# Patient Record
Sex: Male | Born: 1960
Health system: Southern US, Community
[De-identification: ages and names within clinical notes are randomized; demographics above are authoritative.]

## PROBLEM LIST (undated history)

## (undated) DIAGNOSIS — T8859XA Other complications of anesthesia, initial encounter: Secondary | ICD-10-CM

## (undated) DIAGNOSIS — J449 Chronic obstructive pulmonary disease, unspecified: Secondary | ICD-10-CM

## (undated) DIAGNOSIS — I499 Cardiac arrhythmia, unspecified: Secondary | ICD-10-CM

## (undated) DIAGNOSIS — G4730 Sleep apnea, unspecified: Secondary | ICD-10-CM

## (undated) DIAGNOSIS — F419 Anxiety disorder, unspecified: Secondary | ICD-10-CM

## (undated) DIAGNOSIS — R0600 Dyspnea, unspecified: Secondary | ICD-10-CM

## (undated) DIAGNOSIS — J189 Pneumonia, unspecified organism: Secondary | ICD-10-CM

## (undated) DIAGNOSIS — M1990 Unspecified osteoarthritis, unspecified site: Secondary | ICD-10-CM

## (undated) HISTORY — DX: Cardiac arrhythmia, unspecified: I49.9

## (undated) HISTORY — PX: SPINE SURGERY: SHX786

## (undated) HISTORY — PX: SEPTOPLASTY: SUR1290

## (undated) HISTORY — PX: COLONOSCOPY: SHX174

## (undated) HISTORY — PX: WRIST SURGERY: SHX841

## (undated) HISTORY — DX: Sleep apnea, unspecified: G47.30

## (undated) HISTORY — PX: SHOULDER ARTHROSCOPY: SHX128

## (undated) HISTORY — DX: Anxiety disorder, unspecified: F41.9

## (undated) HISTORY — PX: RHINOPLASTY: SUR1284

---

## 2001-05-14 ENCOUNTER — Emergency Department (HOSPITAL_COMMUNITY): Admission: EM | Admit: 2001-05-14 | Discharge: 2001-05-14 | Payer: Self-pay | Admitting: Emergency Medicine

## 2001-05-14 ENCOUNTER — Encounter: Payer: Self-pay | Admitting: Emergency Medicine

## 2001-05-19 ENCOUNTER — Emergency Department (HOSPITAL_COMMUNITY): Admission: EM | Admit: 2001-05-19 | Discharge: 2001-05-19 | Payer: Self-pay | Admitting: *Deleted

## 2003-11-16 ENCOUNTER — Ambulatory Visit (HOSPITAL_BASED_OUTPATIENT_CLINIC_OR_DEPARTMENT_OTHER): Admission: RE | Admit: 2003-11-16 | Discharge: 2003-11-16 | Payer: Self-pay | Admitting: Orthopedic Surgery

## 2003-11-16 ENCOUNTER — Ambulatory Visit (HOSPITAL_COMMUNITY): Admission: RE | Admit: 2003-11-16 | Discharge: 2003-11-16 | Payer: Self-pay | Admitting: Orthopedic Surgery

## 2009-08-23 ENCOUNTER — Ambulatory Visit: Payer: Self-pay | Admitting: Internal Medicine

## 2009-08-23 DIAGNOSIS — F329 Major depressive disorder, single episode, unspecified: Secondary | ICD-10-CM

## 2009-08-23 DIAGNOSIS — F32A Depression, unspecified: Secondary | ICD-10-CM | POA: Insufficient documentation

## 2009-08-23 DIAGNOSIS — R079 Chest pain, unspecified: Secondary | ICD-10-CM | POA: Insufficient documentation

## 2009-08-23 DIAGNOSIS — F419 Anxiety disorder, unspecified: Secondary | ICD-10-CM | POA: Insufficient documentation

## 2009-08-23 LAB — CONVERTED CEMR LAB
ALT: 22 units/L (ref 0–53)
AST: 23 units/L (ref 0–37)
Albumin: 4 g/dL (ref 3.5–5.2)
Alkaline Phosphatase: 51 units/L (ref 39–117)
BUN: 11 mg/dL (ref 6–23)
Basophils Absolute: 0 10*3/uL (ref 0.0–0.1)
Basophils Relative: 0.8 % (ref 0.0–3.0)
Bilirubin, Direct: 0.1 mg/dL (ref 0.0–0.3)
CO2: 30 meq/L (ref 19–32)
Calcium: 9.2 mg/dL (ref 8.4–10.5)
Chloride: 110 meq/L (ref 96–112)
Cholesterol: 181 mg/dL (ref 0–200)
Creatinine, Ser: 0.8 mg/dL (ref 0.4–1.5)
Eosinophils Absolute: 0.3 10*3/uL (ref 0.0–0.7)
Eosinophils Relative: 4.2 % (ref 0.0–5.0)
GFR calc non Af Amer: 110.07 mL/min (ref >60)
Glucose, Bld: 92 mg/dL (ref 70–99)
HCT: 45.3 % (ref 39.0–52.0)
Hemoglobin: 14.9 g/dL (ref 13.0–17.0)
Lymphocytes Relative: 34.3 % (ref 12.0–46.0)
Lymphs Abs: 2.1 10*3/uL (ref 0.7–4.0)
MCHC: 32.9 g/dL (ref 30.0–36.0)
MCV: 92.4 fL (ref 78.0–100.0)
Monocytes Absolute: 0.5 10*3/uL (ref 0.1–1.0)
Monocytes Relative: 7.9 % (ref 3.0–12.0)
Neutro Abs: 3.2 10*3/uL (ref 1.4–7.7)
Neutrophils Relative %: 52.8 % (ref 43.0–77.0)
Platelets: 220 10*3/uL (ref 150.0–400.0)
Potassium: 4.5 meq/L (ref 3.5–5.1)
RBC: 4.9 M/uL (ref 4.22–5.81)
RDW: 12.5 % (ref 11.5–14.6)
Sodium: 144 meq/L (ref 135–145)
TSH: 0.87 microintl units/mL (ref 0.35–5.50)
Total Bilirubin: 0.7 mg/dL (ref 0.3–1.2)
Total Protein: 7.1 g/dL (ref 6.0–8.3)
WBC: 6.1 10*3/uL (ref 4.5–10.5)

## 2010-07-17 NOTE — Assessment & Plan Note (Signed)
Summary: to be est/njr   Vital Signs:  Patient profile:   50 year old male Height:      72 inches Weight:      253 pounds BMI:     34.44 Temp:     98.4 degrees F oral BP sitting:   92 / 60  (right arm) Cuff size:   regular  Vitals Entered By: Duard Brady LPN (August 24, 6043 9:29 AM) CC: cpx - wants to stop smoking , c/o (L) side chest pain on/off  , insomnia Is Patient Diabetic? No   CC:  cpx - wants to stop smoking , c/o (L) side chest pain on/off  , and insomnia.  History of Present Illness: 50 year old gentleman, who is in today to establish with our practice.  He has a long history of ongoing tobacco use and wishes to pursue a smoking cessation program.  He does have a history of depression and his word about the exacerbation with treatment.  Past medical history is otherwise fairly unremarkable.  He does describe some occasional left anterior chest wall pain that has been chronic for some time.  He has a history of nail psoriasis  Preventive Screening-Counseling & Management  Alcohol-Tobacco     Smoking Status: current     Smoking Cessation Counseling: yes  Allergies (verified): No Known Drug Allergies  Past History:  Past Medical History: tobacco abuse nail  psoriasis Depression  Past Surgical History: rhinoplasty x 4 ankle injury.  Bone spur removal 2005  Family History: adopted  Social History: Reviewed history and no changes required. Divorced Current Smoker two children, ages 48 and 23Smoking Status:  current  Review of Systems       The patient complains of chest pain.  The patient denies anorexia, fever, weight loss, weight gain, vision loss, decreased hearing, hoarseness, syncope, dyspnea on exertion, peripheral edema, prolonged cough, headaches, hemoptysis, abdominal pain, melena, hematochezia, severe indigestion/heartburn, hematuria, incontinence, genital sores, muscle weakness, suspicious skin lesions, transient blindness, difficulty  walking, depression, unusual weight change, abnormal bleeding, enlarged lymph nodes, angioedema, breast masses, and testicular masses.    Physical Exam  General:  overweight-appearing.  low-normal blood pressure Head:  Normocephalic and atraumatic without obvious abnormalities. No apparent alopecia or balding. Eyes:  No corneal or conjunctival inflammation noted. EOMI. Perrla. Funduscopic exam benign, without hemorrhages, exudates or papilledema. Vision grossly normal. Ears:  External ear exam shows no significant lesions or deformities.  Otoscopic examination reveals clear canals, tympanic membranes are intact bilaterally without bulging, retraction, inflammation or discharge. Hearing is grossly normal bilaterally. Mouth:  Oral mucosa and oropharynx without lesions or exudates.  Teeth in good repair. Neck:  No deformities, masses, or tenderness noted. Chest Wall:  No deformities, masses, tenderness or gynecomastia noted. Breasts:  No masses or gynecomastia noted Lungs:  Normal respiratory effort, chest expands symmetrically. Lungs are clear to auscultation, no crackles or wheezes. Heart:  Normal rate and regular rhythm. S1 and S2 normal without gallop, murmur, click, rub or other extra sounds. Abdomen:  Bowel sounds positive,abdomen soft and non-tender without masses, organomegaly or hernias noted. Genitalia:  Testes bilaterally descended without nodularity, tenderness or masses. No scrotal masses or lesions. No penis lesions or urethral discharge. Msk:  No deformity or scoliosis noted of thoracic or lumbar spine.   Pulses:  R and L carotid,radial,femoral,dorsalis pedis and posterior tibial pulses are full and equal bilaterally Extremities:  No clubbing, cyanosis, edema, or deformity noted with normal full range of motion of all joints.  Neurologic:  alert & oriented X3, cranial nerves II-XII intact, sensation intact to light touch, gait normal, and DTRs symmetrical and normal.   Skin:  Intact  without suspicious lesions or rashes  nail  psoriasis involving both the hands and feet Cervical Nodes:  No lymphadenopathy noted Axillary Nodes:  No palpable lymphadenopathy Inguinal Nodes:  No significant adenopathy Psych:  Cognition and judgment appear intact. Alert and cooperative with normal attention span and concentration. No apparent delusions, illusions, hallucinations   Impression & Recommendations:  Problem # 1:  Preventive Health Care (ICD-V70.0)  Orders: Venipuncture (16109) TLB-BMP (Basic Metabolic Panel-BMET) (80048-METABOL) TLB-CBC Platelet - w/Differential (85025-CBCD) TLB-Hepatic/Liver Function Pnl (80076-HEPATIC) TLB-TSH (Thyroid Stimulating Hormone) (84443-TSH) TLB-Cholesterol, Total (82465-CHO)  Complete Medication List: 1)  Chantix Starting Month Pak 0.5 Mg X 11 & 1 Mg X 42 Tabs (Varenicline tartrate) .... As directed 2)  Chantix 1 Mg Tabs (Varenicline tartrate) .... One twice daily 3)  Depakote 250 Mg Tbec (Divalproex sodium) .... One daily  Other Orders: EKG w/ Interpretation (93000)  Patient Instructions: 1)  Tobacco is very bad for your health and your loved ones! You Should stop smoking!. 2)  It is important that you exercise regularly at least 20 minutes 5 times a week. If you develop chest pain, have severe difficulty breathing, or feel very tired , stop exercising immediately and seek medical attention. 3)  You need to lose weight. Consider a lower calorie diet and regular exercise.  Prescriptions: DEPAKOTE 250 MG TBEC (DIVALPROEX SODIUM) one daily  #90 x 0   Entered and Authorized by:   Gordy Savers  MD   Signed by:   Gordy Savers  MD on 08/23/2009   Method used:   Print then Give to Patient   RxID:   [id number] CHANTIX 1 MG TABS (VARENICLINE TARTRATE) one twice daily  #60 x 2   Entered and Authorized by:   Gordy Savers  MD   Signed by:   Gordy Savers  MD on 08/23/2009   Method used:   Print then Give to  Patient   RxID:   [id number] CHANTIX STARTING MONTH PAK 0.5 MG X 11 & 1 MG X 42 TABS (VARENICLINE TARTRATE) as directed  #one month x 0   Entered and Authorized by:   Gordy Savers  MD   Signed by:   Gordy Savers  MD on 08/23/2009   Method used:   Print then Give to Patient   RxID:   [id number]

## 2010-11-02 NOTE — Op Note (Signed)
NAME:  Billy Roman, Billy Roman                     ACCOUNT NO.:  [id number]   MEDICAL RECORD NO.:  [id number]                   PATIENT TYPE:  AMB   LOCATION:  [facility]                                  FACILITY:  [facility]   PHYSICIAN:  Mila Homer. Sherlean Foot, M.D.              DATE OF BIRTH:  1960/07/11   DATE OF PROCEDURE:  11/16/2003  DATE OF DISCHARGE:                                 OPERATIVE REPORT   SURGEON:  Mila Homer. Sherlean Foot, M.D.   ASSISTANT:  None.   ANESTHESIA:  General.   PREOPERATIVE DIAGNOSIS:  Left ankle osteoarthritis with anterior spur and  loose bodies.   POSTOPERATIVE DIAGNOSIS:  Left ankle osteoarthritis with anterior spur and  loose bodies.   PROCEDURE:  Left ankle arthroscopy with anterior spur removal, loose body  removal, and debridement.   DESCRIPTION OF PROCEDURE:  The patient was taken to the operating room,  administered general anesthesia.  The left leg was placed in the flexed  position with a leg holder proximal to the knee.  The left lower extremity  was prepped and draped in the usual sterile fashion.  Anterolateral and  anteromedial portals were created with an #11 blade, blunt trocar, and  cannula.  There were large areas of grade 4 chondromalacia on the tibia,  grade 1 and 2 changes only on the talus, and lots and lots of scar tissue.  A 2.0 mm great white shaver was used to debride the scar tissue.  There were  several delaminating surfaces of articular cartilage which were removed.  There were two very large loose bodies which were removed as well.  I then  isolated off the anterior spur with an Arthrex probe and debridement wand.  I then used a 2.9 ball-shaped bur to remove the anterior osteophyte.  I then  took lateral with the [facility] device to ensure that the radiograph evidence of  the spur was removed in its entirety.  This allowed excellent anterior  decompression.  I then removed the fluid and instruments and closed with  Steri-Strips, dressings,  sponges, sterile Webril and Ace wrap.  Complications none.  Drains none.                                               Mila Homer. Sherlean Foot, M.D.    SDL/MEDQ  D:  11/16/2003  T:  11/16/2003  Job:  [id number]

## 2015-10-08 ENCOUNTER — Ambulatory Visit (INDEPENDENT_AMBULATORY_CARE_PROVIDER_SITE_OTHER): Payer: Managed Care, Other (non HMO) | Admitting: Emergency Medicine

## 2015-10-08 VITALS — BP 132/82 | HR 79 | Temp 97.5°F | Resp 16 | Ht 72.0 in | Wt 291.0 lb

## 2015-10-08 DIAGNOSIS — Z131 Encounter for screening for diabetes mellitus: Secondary | ICD-10-CM | POA: Diagnosis not present

## 2015-10-08 DIAGNOSIS — M5489 Other dorsalgia: Secondary | ICD-10-CM | POA: Diagnosis not present

## 2015-10-08 LAB — GLUCOSE, POCT (MANUAL RESULT ENTRY): POC Glucose: 92 mg/dl (ref 70–99)

## 2015-10-08 MED ORDER — PREDNISONE 10 MG PO TABS: ORAL_TABLET | ORAL | Status: DC

## 2015-10-08 NOTE — Progress Notes (Signed)
By signing my name below, I, Raven Small, attest that this documentation has been prepared under the direction and in the presence of Nena Jordan, MD.  Electronically Signed: Thea Alken, ED Scribe. 10/08/2015. 2:17 PM.  Chief Complaint:  Chief Complaint  Patient presents with  . Back Pain    x 2 days   HPI: Billy Roman is a 55 y.o. male who reports to [facility] today complaining of intermittent low back spasm. Pt has hx of laminectomy in 2013. Pt states he had been working underneath his car 4 days ago aggravating back pain.  He has taken 2 aleve past 16 hours without relief to the pain. He also takes diazepam but only at night. Pt has 10 hour plane trip to [facility] in 4 days ago and would like anti inflammatory for back pain. He has been on prednisone in the past. He denies other medical problems.    History reviewed. No pertinent past medical history. History reviewed. No pertinent past surgical history. Social History   Social History  . Marital Status: Single    Spouse Name: N/A  . Number of Children: N/A  . Years of Education: N/A   Social History Main Topics  . Smoking status: Never Smoker   . Smokeless tobacco: None  . Alcohol Use: None  . Drug Use: None  . Sexual Activity: Not Asked   Other Topics Concern  . None   Social History Narrative  . None   History reviewed. No pertinent family history. Allergies  Allergen Reactions  . Propyphenazone Itching and Rash   Prior to Admission medications   Medication Sig Start Date End Date Taking? Authorizing Provider  diazepam (VALIUM) 5 MG tablet Take 5 mg by mouth every 6 (six) hours as needed for anxiety.   Yes Historical Provider, MD     ROS: The patient denies fevers, chills, night sweats, unintentional weight loss, chest pain, palpitations, wheezing, dyspnea on exertion, nausea, vomiting, abdominal pain, dysuria, hematuria, melena, numbness, weakness, or tingling.   All other systems have been reviewed and  were otherwise negative with the exception of those mentioned in the HPI and as above.    PHYSICAL EXAM: Filed Vitals:   10/08/15 1354  BP: 132/82  Pulse: 79  Temp: 97.5 F (36.4 C)  Resp: 16   Body mass index is 39.46 kg/(m^2).   General: Alert, no acute distress HEENT:  Normocephalic, atraumatic, oropharynx patent. Eye: Juliette Mangle [facility] Cardiovascular:  Regular rate and rhythm, no rubs murmurs or gallops.  No Carotid bruits, radial pulse intact. No pedal edema.  Respiratory: Clear to auscultation bilaterally.  No wheezes, rales, or rhonchi.  No cyanosis, no use of accessory musculature Abdominal: No organomegaly, abdomen is soft and non-tender, positive bowel sounds.  No masses. Musculoskeletal: Gait intact. No edema healed scar over lower lumbar spine. Straight leg raise at 90 degrees causes discomfort. Reflexes are 1+ knees and ankle. No definite focal weakness.  Skin: No rashes. Neurologic: Facial musculature symmetric. Psychiatric: Patient acts appropriately throughout our interaction. Lymphatic: No cervical or submandibular lymphadenopathy    LABS: Results for orders placed or performed in visit on 10/08/15  POCT glucose (manual entry)  Result Value Ref Range   POC Glucose 92 70 - 99 mg/dl    ASSESSMENT/PLAN: Sugar was good. Will treat with a taper dose of prednisone 10 mg. 40 mg a day for 3 days 30 mg a day for 3 days 20 mg a day for 3 days 10 mg  a day for 3 days. He does have diazepam he can take at night for back pain.I personally performed the services described in this documentation, which was scribed in my presence. The recorded information has been reviewed and is accurate.   Gross sideeffects, risk and benefits, and alternatives of medications d/w patient. Patient is aware that all medications have potential sideeffects and we are unable to predict every sideeffect or drug-drug interaction that may occur.  Arlyss Queen MD 10/08/2015 2:14 PM

## 2015-10-08 NOTE — Patient Instructions (Addendum)
IF you received an x-ray today, you will receive an invoice from [facility]. Please contact [facility] at [phone] with questions or concerns regarding your invoice.   IF you received labwork today, you will receive an invoice from [employer]. Please contact [facility] at [phone] with questions or concerns regarding your invoice.   Our billing staff will not be able to assist you with questions regarding bills from these companies.  You will be contacted with the lab results as soon as they are available. The fastest way to get your results is to activate your My Chart account. Instructions are located on the last page of this paperwork. If you have not heard from [place] regarding the results in 2 weeks, please contact this office.     Back Pain, Adult Back pain is very common in adults.The cause of back pain is rarely dangerous and the pain often gets better over time.The cause of your back pain may not be known. Some common causes of back pain include:  Strain of the muscles or ligaments supporting the spine.  Wear and tear (degeneration) of the spinal disks.  Arthritis.  Direct injury to the back. For many people, back pain may return. Since back pain is rarely dangerous, most people can learn to manage this condition on their own. HOME CARE INSTRUCTIONS Watch your back pain for any changes. The following actions may help to lessen any discomfort you are feeling:  Remain active. It is stressful on your back to sit or stand in one place for long periods of time. Do not sit, drive, or stand in one place for more than 30 minutes at a time. Take short walks on even surfaces as soon as you are able.Try to increase the length of time you walk each day.  Exercise regularly as directed by your health care provider. Exercise helps your back heal faster. It also helps avoid future injury by keeping your muscles strong and flexible.  Do  not stay in bed.Resting more than 1-2 days can delay your recovery.  Pay attention to your body when you bend and lift. The most comfortable positions are those that put less stress on your recovering back. Always use proper lifting techniques, including:  Bending your knees.  Keeping the load close to your body.  Avoiding twisting.  Find a comfortable position to sleep. Use a firm mattress and lie on your side with your knees slightly bent. If you lie on your back, put a pillow under your knees.  Avoid feeling anxious or stressed.Stress increases muscle tension and can worsen back pain.It is important to recognize when you are anxious or stressed and learn ways to manage it, such as with exercise.  Take medicines only as directed by your health care provider. Over-the-counter medicines to reduce pain and inflammation are often the most helpful.Your health care provider may prescribe muscle relaxant drugs.These medicines help dull your pain so you can more quickly return to your normal activities and healthy exercise.  Apply ice to the injured area:  Put ice in a plastic bag.  Place a towel between your skin and the bag.  Leave the ice on for 20 minutes, 2-3 times a day for the first 2-3 days. After that, ice and heat may be alternated to reduce pain and spasms.  Maintain a healthy weight. Excess weight puts extra stress on your back and makes it difficult to maintain good posture. SEEK MEDICAL CARE IF:  You have  pain that is not relieved with rest or medicine.  You have increasing pain going down into the legs or buttocks.  You have pain that does not improve in one week.  You have night pain.  You lose weight.  You have a fever or chills. SEEK IMMEDIATE MEDICAL CARE IF:   You develop new bowel or bladder control problems.  You have unusual weakness or numbness in your arms or legs.  You develop nausea or vomiting.  You develop abdominal pain.  You feel faint.    This information is not intended to replace advice given to you by your health care provider. Make sure you discuss any questions you have with your health care provider.   Document Released: 06/03/2005 Document Revised: 06/24/2014 Document Reviewed: 10/05/2013 Elsevier Interactive Patient Education [employer].

## 2016-01-10 ENCOUNTER — Ambulatory Visit (INDEPENDENT_AMBULATORY_CARE_PROVIDER_SITE_OTHER): Payer: Managed Care, Other (non HMO) | Admitting: Physician Assistant

## 2016-01-10 VITALS — BP 126/88 | HR 88 | Temp 98.1°F | Resp 18 | Ht 72.0 in | Wt 300.0 lb

## 2016-01-10 DIAGNOSIS — L559 Sunburn, unspecified: Secondary | ICD-10-CM

## 2016-01-10 DIAGNOSIS — R21 Rash and other nonspecific skin eruption: Secondary | ICD-10-CM | POA: Diagnosis not present

## 2016-01-10 MED ORDER — SILVER SULFADIAZINE 1 % EX CREA: 1.0000 "application " | TOPICAL_CREAM | Freq: Every day | CUTANEOUS | 0 refills | Status: DC

## 2016-01-10 NOTE — Patient Instructions (Addendum)
IF you received an x-ray today, you will receive an invoice from [facility]. Please contact [facility] at [phone] with questions or concerns regarding your invoice.   IF you received labwork today, you will receive an invoice from [employer]. Please contact [facility] at [phone] with questions or concerns regarding your invoice.   Our billing staff will not be able to assist you with questions regarding bills from these companies.  You will be contacted with the lab results as soon as they are available. The fastest way to get your results is to activate your My Chart account. Instructions are located on the last page of this paperwork. If you have not heard from [place] regarding the results in 2 weeks, please contact this office.    Please apply this daily to the area.  Elevate the legs and rest as much as possible.   We should see you again in 3 days for recheck, unless miraculously better. Sunburn Sunburn is damage to the skin caused by overexposure to ultraviolet (UV) rays. People with light skin or a fair complexion may be more susceptible to sunburn. Repeated sun exposure causes early skin aging such as wrinkles and sun spots. It also increases the risk of skin cancer. CAUSES A sunburn is caused by getting too much UV radiation from the sun. SYMPTOMS  Red or pink skin.  Soreness and swelling.  Pain.  Blisters.  Peeling skin.  Headache, fever, and fatigue if sunburn covers a large area. TREATMENT  Your caregiver may tell you to take certain medicines to lessen inflammation.  Your caregiver may have you use hydrocortisone cream or spray to help with itching and inflammation.  Your caregiver may prescribe an antibiotic cream to use on blisters. HOME CARE INSTRUCTIONS   Avoid further exposure to the sun.  Cool baths and cool compresses may be helpful if used several times per day. Do not apply ice, since this may  result in more damage to the skin.  Only take over-the-counter or prescription medicines for pain, discomfort, or fever as directed by your caregiver.  Use aloe or other over-the-counter sunburn creams or gels on your skin. Do not apply these creams or gels on blisters.  Drink enough fluids to keep your urine clear or pale yellow.  Do not break blisters. If blisters break, your caregiver may recommend an antibiotic cream to apply to the affected area. PREVENTION   Try to avoid the sun between 10:00 a.m. and 4:00 p.m. when it is the strongest.  Apply sunscreen at least 30 minutes before exposure to the sun.  Always wear protective hats, clothing, and sunglasses with UV protection.  Avoid medicines, herbs, and foods that increase your sensitivity to sunlight.  Avoid tanning beds. SEEK IMMEDIATE MEDICAL CARE IF:   You have a fever.  Your pain is uncontrolled with medicine.  You start to vomit or have diarrhea.  You feel faint or develop a headache with confusion.  You develop severe blistering.  You have a pus-like (purulent) discharge coming from the blisters.  Your burn becomes more painful and swollen. MAKE SURE YOU:  Understand these instructions.  Will watch your condition.  Will get help right away if you are not doing well or get worse.   This information is not intended to replace advice given to you by your health care provider. Make sure you discuss any questions you have with your health care provider.   Document Released: 03/13/2005 Document Revised: 09/28/2012  Document Reviewed: 12/05/2014 Elsevier Interactive Patient Education [employer].

## 2016-01-10 NOTE — Progress Notes (Signed)
[facility] [address], [place] [address] [phone]  Date:  01/10/2016   Name:  Billy Roman   DOB:  Mar 11, 1961   MRN:  [id number]  PCP:  No PCP Per Patient   Chief Complaint  Patient presents with  . Other    SUN POISON ON LEGS   History of Present Illness:  Billy Roman is a 55 y.o. male patient who presents to [facility] for rash on legs  3 days ago.  Exposed to sunlight for several hours without sun block about 4 days ago.  He has noted the redness and swelling of his lower legs.  They are tender.  He notes subjective fever and chills.  He has no rash along arms or trunk.  He has had this before.  No known exposure to poison oak/sumac/or ivy.  No known cuts or scrapes.        Patient Active Problem List   Diagnosis Date Noted  . DEPRESSION 08/23/2009  . CHEST PAIN UNSPECIFIED 08/23/2009    History reviewed. No pertinent past medical history.  History reviewed. No pertinent surgical history.  Social History  Substance Use Topics  . Smoking status: Never Smoker  . Smokeless tobacco: Never Used  . Alcohol use No    History reviewed. No pertinent family history.  Allergies  Allergen Reactions  . Propyphenazone Itching and Rash    Medication list has been reviewed and updated.  Current Outpatient Prescriptions on File Prior to Visit  Medication Sig Dispense Refill  . diazepam (VALIUM) 5 MG tablet Take 5 mg by mouth every 6 (six) hours as needed for anxiety.    . predniSONE (DELTASONE) 10 MG tablet Take 4  a  day for 3 days 3 a day for 3 days 2 a day for 3 days one a day for 3 days (Patient not taking: Reported on 01/10/2016) 30 tablet 0   No current facility-administered medications on file prior to visit.     ROS ROS otherwise unremarkable unless listed above.   Physical Examination: BP 126/88   Pulse 88   Temp 98.1 F (36.7 C) (Oral)   Resp 18   Ht 6' (1.829 m)   Wt 300 lb (136.1 kg)   SpO2 97%   BMI 40.69 kg/m   Ideal Body Weight: Weight in (lb) to have BMI = 25: 183.9  Physical Exam  Constitutional: He is oriented to person, place, and time. He appears well-developed and well-nourished. No distress.  HENT:  Head: Normocephalic and atraumatic.  Eyes: Conjunctivae and EOM are normal. Pupils are equal, round, and reactive to light.  Cardiovascular: Normal rate.   Pulmonary/Chest: Effort normal. No respiratory distress.  Neurological: He is alert and oriented to person, place, and time.  Skin: Skin is warm and dry. No abrasion and no ecchymosis noted. He is not diaphoretic.  Anterior severely erythematous lower starting at the knee.  This is consistent color.  Tenderness along the tibia.  There are bilateral bullae with serous fluid.  No purulence.  Tape removed with superficial layer removed upon removal.  Psychiatric: He has a normal mood and affect. His behavior is normal.     Assessment and Plan: Billy Roman is a 55 y.o. male who is here today for cc of rash along legs. Severe sunburning. Placed silver sulfazidine.  First aid bandage placed.  Advised wound care.   rtc in 3 days. Rash and nonspecific skin eruption - Plan: POCT CBC, silver  sulfADIAZINE (SILVADENE) 1 % cream  Burn from the sun - Plan: silver sulfADIAZINE (SILVADENE) 1 % cream   Ivar Drape, PA-C [facility] 01/10/2016 11:34 AM

## 2016-04-18 ENCOUNTER — Ambulatory Visit (INDEPENDENT_AMBULATORY_CARE_PROVIDER_SITE_OTHER): Payer: Managed Care, Other (non HMO) | Admitting: Physician Assistant

## 2016-04-18 VITALS — BP 126/88 | HR 87 | Temp 98.6°F | Resp 16 | Ht 72.0 in | Wt 298.0 lb

## 2016-04-18 DIAGNOSIS — R21 Rash and other nonspecific skin eruption: Secondary | ICD-10-CM | POA: Diagnosis not present

## 2016-04-18 DIAGNOSIS — J988 Other specified respiratory disorders: Secondary | ICD-10-CM

## 2016-04-18 MED ORDER — GUAIFENESIN ER 1200 MG PO TB12: 1.0000 | ORAL_TABLET | Freq: Two times a day (BID) | ORAL | 1 refills | Status: DC | PRN

## 2016-04-18 MED ORDER — FLUTICASONE PROPIONATE 50 MCG/ACT NA SUSP: 2.0000 | Freq: Every day | NASAL | 1 refills | Status: DC

## 2016-04-18 MED ORDER — CLOTRIMAZOLE-BETAMETHASONE 1-0.05 % EX CREA: 1.0000 "application " | TOPICAL_CREAM | Freq: Two times a day (BID) | CUTANEOUS | 0 refills | Status: DC

## 2016-04-18 NOTE — Progress Notes (Signed)
[facility] [address], [place] [address] [phone]  Date:  04/18/2016   Name:  Billy Roman   DOB:  1960/06/27   MRN:  [id number]  PCP:  No PCP Per Patient    History of Present Illness:  Billy Roman is a 55 y.o. male patient who presents to [facility] for cc of coughing and hand rash.    Coughing in the morning a productive greenish yellow mucus, for 1 week.  He has some dyspnea.  3-4 days ago.  Sore throat and hoarseness.  No nasal congestion.  No seasonal allergies.  No sneezing or water eyes.    Middle left hand in between has blister that will weep.  He puts cortisone which helps.  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  This has gone on chronically, and heals but then reopens.  He states that he avoids scratching but at times will.     Patient Active Problem List   Diagnosis Date Noted  . DEPRESSION 08/23/2009  . CHEST PAIN UNSPECIFIED 08/23/2009    History reviewed. No pertinent past medical history.  History reviewed. No pertinent surgical history.  Social History  Substance Use Topics  . Smoking status: Former Research scientist (life sciences)  . Smokeless tobacco: Never Used  . Alcohol use No    History reviewed. No pertinent family history.  Allergies  Allergen Reactions  . Propyphenazone Itching and Rash    Medication list has been reviewed and updated.  Current Outpatient Prescriptions on File Prior to Visit  Medication Sig Dispense Refill  . diazepam (VALIUM) 5 MG tablet Take 5 mg by mouth every 6 (six) hours as needed for anxiety.    . predniSONE (DELTASONE) 10 MG tablet Take 4  a  day for 3 days 3 a day for 3 days 2 a day for 3 days one a day for 3 days (Patient not taking: Reported on 04/18/2016) 30 tablet 0  . silver sulfADIAZINE (SILVADENE) 1 % cream Apply 1 application topically daily. To lower leg (Patient not taking: Reported on 04/18/2016)  50 g 0   No current facility-administered medications on file prior to visit.     ROS   Physical Examination: BP 126/88   Pulse 87   Temp 98.6 F (37 C) (Oral)   Resp 16   Ht 6' (1.829 m)   Wt 298 lb (135.2 kg)   SpO2 96%   BMI 40.42 kg/m  Ideal Body Weight: Weight in (lb) to have BMI = 25: 183.9  Physical Exam  Constitutional: He is oriented to person, place, and time. He appears well-developed and well-nourished. No distress.  HENT:  Head: Normocephalic and atraumatic.  Right Ear: Tympanic membrane, external ear and ear canal normal.  Left Ear: Tympanic membrane, external ear and ear canal normal.  Nose: Mucosal edema and rhinorrhea present. Right sinus exhibits no maxillary sinus tenderness and no frontal sinus tenderness. Left sinus exhibits no maxillary sinus tenderness and no frontal sinus tenderness.  Mouth/Throat: No uvula swelling. No oropharyngeal exudate, posterior oropharyngeal edema or posterior oropharyngeal erythema.  Eyes: Conjunctivae, EOM and lids are normal. Pupils are equal, round, and reactive to light. Right eye exhibits normal extraocular motion. Left eye exhibits normal extraocular motion.  Neck: Trachea normal and full passive range of motion without pain. No edema and no erythema present.  Cardiovascular: Normal rate.  Exam reveals  no gallop and no friction rub.   No murmur heard. Pulmonary/Chest: Effort normal. No respiratory distress. He has no decreased breath sounds. He has no wheezes. He has no rhonchi.  Neurological: He is alert and oriented to person, place, and time.  Skin: Skin is warm and dry. He is not diaphoretic.  Interdigit at the 3rd and 4th finger with three epidermal tears.  No erythema.  Non-tender  Psychiatric: He has a normal mood and affect. His behavior is normal.     Assessment and Plan: Billy Roman is a 55 y.o. male who is here today for cc of cough, congestion, and hand pain. I will treat with fungal/steroid.   Advised not to use over 2 weeks.   Will treat with mucinex at this time.  Also advised flonase.  Hydration instructed.  If he contacts after 72 hours, it is fine to prescribe azithromycin in a zpak form.   Rash of hands - Plan: clotrimazole-betamethasone (LOTRISONE) cream  Respiratory infection - Plan: Guaifenesin (MUCINEX MAXIMUM STRENGTH) 1200 MG TB12  Ivar Drape, PA-C [facility] 04/18/2016 10:12 AM

## 2016-04-18 NOTE — Patient Instructions (Addendum)
Please take the mucinex and flonase as prescribed.   Increase your hydration to 64 oz of water if not more. If you continue to have symptoms after 3 more days, I would like you to contact by phone or come in. Hydration, use of your humidifier, and vocal rest for the hoarseness.    Upper Respiratory Infection, Adult Most upper respiratory infections (URIs) are a viral infection of the air passages leading to the lungs. A URI affects the nose, throat, and upper air passages. The most common type of URI is nasopharyngitis and is typically referred to as "the common cold." URIs run their course and usually go away on their own. Most of the time, a URI does not require medical attention, but sometimes a bacterial infection in the upper airways can follow a viral infection. This is called a secondary infection. Sinus and middle ear infections are common types of secondary upper respiratory infections. Bacterial pneumonia can also complicate a URI. A URI can worsen asthma and chronic obstructive pulmonary disease (COPD). Sometimes, these complications can require emergency medical care and may be life threatening.  CAUSES Almost all URIs are caused by viruses. A virus is a type of germ and can spread from one person to another.  RISKS FACTORS You may be at risk for a URI if:   You smoke.   You have chronic heart or lung disease.  You have a weakened defense (immune) system.   You are very young or very old.   You have nasal allergies or asthma.  You work in crowded or poorly ventilated areas.  You work in health care facilities or schools. SIGNS AND SYMPTOMS  Symptoms typically develop 2-3 days after you come in contact with a cold virus. Most viral URIs last 7-10 days. However, viral URIs from the influenza virus (flu virus) can last 14-18 days and are typically more severe. Symptoms may include:   Runny or stuffy (congested) nose.   Sneezing.   Cough.   Sore throat.    Headache.   Fatigue.   Fever.   Loss of appetite.   Pain in your forehead, behind your eyes, and over your cheekbones (sinus pain).  Muscle aches.  DIAGNOSIS  Your health care provider may diagnose a URI by:  Physical exam.  Tests to check that your symptoms are not due to another condition such as:  Strep throat.  Sinusitis.  Pneumonia.  Asthma. TREATMENT  A URI goes away on its own with time. It cannot be cured with medicines, but medicines may be prescribed or recommended to relieve symptoms. Medicines may help:  Reduce your fever.  Reduce your cough.  Relieve nasal congestion. HOME CARE INSTRUCTIONS   Take medicines only as directed by your health care provider.   Gargle warm saltwater or take cough drops to comfort your throat as directed by your health care provider.  Use a warm mist humidifier or inhale steam from a shower to increase air moisture. This may make it easier to breathe.  Drink enough fluid to keep your urine clear or pale yellow.   Eat soups and other clear broths and maintain good nutrition.   Rest as needed.   Return to work when your temperature has returned to normal or as your health care provider advises. You may need to stay home longer to avoid infecting others. You can also use a face mask and careful hand washing to prevent spread of the virus.  Increase the usage of your inhaler if  you have asthma.   Do not use any tobacco products, including cigarettes, chewing tobacco, or electronic cigarettes. If you need help quitting, ask your health care provider. PREVENTION  The best way to protect yourself from getting a cold is to practice good hygiene.   Avoid oral or hand contact with people with cold symptoms.   Wash your hands often if contact occurs.  There is no clear evidence that vitamin C, vitamin E, echinacea, or exercise reduces the chance of developing a cold. However, it is always recommended to get plenty  of rest, exercise, and practice good nutrition.  SEEK MEDICAL CARE IF:   You are getting worse rather than better.   Your symptoms are not controlled by medicine.   You have chills.  You have worsening shortness of breath.  You have brown or red mucus.  You have yellow or brown nasal discharge.  You have pain in your face, especially when you bend forward.  You have a fever.  You have swollen neck glands.  You have pain while swallowing.  You have white areas in the back of your throat. SEEK IMMEDIATE MEDICAL CARE IF:   You have severe or persistent:  Headache.  Ear pain.  Sinus pain.  Chest pain.  You have chronic lung disease and any of the following:  Wheezing.  Prolonged cough.  Coughing up blood.  A change in your usual mucus.  You have a stiff neck.  You have changes in your:  Vision.  Hearing.  Thinking.  Mood. MAKE SURE YOU:   Understand these instructions.  Will watch your condition.  Will get help right away if you are not doing well or get worse.   This information is not intended to replace advice given to you by your health care provider. Make sure you discuss any questions you have with your health care provider.   Document Released: 11/27/2000 Document Revised: 10/18/2014 Document Reviewed: 09/08/2013 Elsevier Interactive Patient Education 2016 [employer].     IF you received an x-ray today, you will receive an invoice from [facility]. Please contact [facility] at [phone] with questions or concerns regarding your invoice.   IF you received labwork today, you will receive an invoice from [employer]. Please contact [facility] at [phone] with questions or concerns regarding your invoice.   Our billing staff will not be able to assist you with questions regarding bills from these companies.  You will be contacted with the lab results as soon as they are available. The  fastest way to get your results is to activate your My Chart account. Instructions are located on the last page of this paperwork. If you have not heard from [place] regarding the results in 2 weeks, please contact this office.

## 2016-05-26 ENCOUNTER — Telehealth: Payer: Self-pay | Admitting: Family Medicine

## 2016-05-26 NOTE — Telephone Encounter (Signed)
Call from answering service. Was last seen 2 or 3 weeks ago, unknown provider. New complaints of fever since last night, somewhat lethargic, but denies any dyspnea/ chest pain. He is taking TheraFlu and Tylenol. Advised office visit with [place] or other medical care provider for further evaluation of symptoms. Hours for tomorrow were provided. ER precautions discussed if worse prior

## 2018-07-02 DIAGNOSIS — G5622 Lesion of ulnar nerve, left upper limb: Secondary | ICD-10-CM | POA: Diagnosis not present

## 2018-12-25 DIAGNOSIS — M25512 Pain in left shoulder: Secondary | ICD-10-CM | POA: Diagnosis not present

## 2019-01-22 DIAGNOSIS — M25512 Pain in left shoulder: Secondary | ICD-10-CM | POA: Diagnosis not present

## 2019-01-29 DIAGNOSIS — M25512 Pain in left shoulder: Secondary | ICD-10-CM | POA: Diagnosis not present

## 2019-03-08 DIAGNOSIS — M75112 Incomplete rotator cuff tear or rupture of left shoulder, not specified as traumatic: Secondary | ICD-10-CM | POA: Diagnosis not present

## 2019-03-22 DIAGNOSIS — M25612 Stiffness of left shoulder, not elsewhere classified: Secondary | ICD-10-CM | POA: Diagnosis not present

## 2019-03-22 DIAGNOSIS — M25512 Pain in left shoulder: Secondary | ICD-10-CM | POA: Diagnosis not present

## 2019-03-25 DIAGNOSIS — M25512 Pain in left shoulder: Secondary | ICD-10-CM | POA: Diagnosis not present

## 2019-03-25 DIAGNOSIS — M25612 Stiffness of left shoulder, not elsewhere classified: Secondary | ICD-10-CM | POA: Diagnosis not present

## 2019-03-30 DIAGNOSIS — M25612 Stiffness of left shoulder, not elsewhere classified: Secondary | ICD-10-CM | POA: Diagnosis not present

## 2019-03-30 DIAGNOSIS — M25512 Pain in left shoulder: Secondary | ICD-10-CM | POA: Diagnosis not present

## 2019-04-01 DIAGNOSIS — M25612 Stiffness of left shoulder, not elsewhere classified: Secondary | ICD-10-CM | POA: Diagnosis not present

## 2019-04-01 DIAGNOSIS — M25512 Pain in left shoulder: Secondary | ICD-10-CM | POA: Diagnosis not present

## 2019-04-05 DIAGNOSIS — M25512 Pain in left shoulder: Secondary | ICD-10-CM | POA: Diagnosis not present

## 2019-04-05 DIAGNOSIS — M25612 Stiffness of left shoulder, not elsewhere classified: Secondary | ICD-10-CM | POA: Diagnosis not present

## 2019-04-08 DIAGNOSIS — M25512 Pain in left shoulder: Secondary | ICD-10-CM | POA: Diagnosis not present

## 2019-04-08 DIAGNOSIS — M25612 Stiffness of left shoulder, not elsewhere classified: Secondary | ICD-10-CM | POA: Diagnosis not present

## 2019-04-12 DIAGNOSIS — M25612 Stiffness of left shoulder, not elsewhere classified: Secondary | ICD-10-CM | POA: Diagnosis not present

## 2019-04-12 DIAGNOSIS — M25512 Pain in left shoulder: Secondary | ICD-10-CM | POA: Diagnosis not present

## 2019-04-16 DIAGNOSIS — M25612 Stiffness of left shoulder, not elsewhere classified: Secondary | ICD-10-CM | POA: Diagnosis not present

## 2019-04-16 DIAGNOSIS — M25512 Pain in left shoulder: Secondary | ICD-10-CM | POA: Diagnosis not present

## 2019-04-21 DIAGNOSIS — M25512 Pain in left shoulder: Secondary | ICD-10-CM | POA: Diagnosis not present

## 2019-04-21 DIAGNOSIS — M25612 Stiffness of left shoulder, not elsewhere classified: Secondary | ICD-10-CM | POA: Diagnosis not present

## 2019-04-23 DIAGNOSIS — M25612 Stiffness of left shoulder, not elsewhere classified: Secondary | ICD-10-CM | POA: Diagnosis not present

## 2019-04-23 DIAGNOSIS — M25512 Pain in left shoulder: Secondary | ICD-10-CM | POA: Diagnosis not present

## 2019-04-27 DIAGNOSIS — M25612 Stiffness of left shoulder, not elsewhere classified: Secondary | ICD-10-CM | POA: Diagnosis not present

## 2019-04-27 DIAGNOSIS — M25512 Pain in left shoulder: Secondary | ICD-10-CM | POA: Diagnosis not present

## 2019-05-04 DIAGNOSIS — M25512 Pain in left shoulder: Secondary | ICD-10-CM | POA: Diagnosis not present

## 2019-05-04 DIAGNOSIS — M25612 Stiffness of left shoulder, not elsewhere classified: Secondary | ICD-10-CM | POA: Diagnosis not present

## 2019-05-06 DIAGNOSIS — M25612 Stiffness of left shoulder, not elsewhere classified: Secondary | ICD-10-CM | POA: Diagnosis not present

## 2019-05-06 DIAGNOSIS — M25512 Pain in left shoulder: Secondary | ICD-10-CM | POA: Diagnosis not present

## 2019-05-10 DIAGNOSIS — M25612 Stiffness of left shoulder, not elsewhere classified: Secondary | ICD-10-CM | POA: Diagnosis not present

## 2019-05-10 DIAGNOSIS — M25512 Pain in left shoulder: Secondary | ICD-10-CM | POA: Diagnosis not present

## 2019-05-12 DIAGNOSIS — M25512 Pain in left shoulder: Secondary | ICD-10-CM | POA: Diagnosis not present

## 2019-05-12 DIAGNOSIS — M25612 Stiffness of left shoulder, not elsewhere classified: Secondary | ICD-10-CM | POA: Diagnosis not present

## 2019-05-18 DIAGNOSIS — M25612 Stiffness of left shoulder, not elsewhere classified: Secondary | ICD-10-CM | POA: Diagnosis not present

## 2019-05-18 DIAGNOSIS — M25512 Pain in left shoulder: Secondary | ICD-10-CM | POA: Diagnosis not present

## 2019-05-20 DIAGNOSIS — M25612 Stiffness of left shoulder, not elsewhere classified: Secondary | ICD-10-CM | POA: Diagnosis not present

## 2019-05-20 DIAGNOSIS — M25512 Pain in left shoulder: Secondary | ICD-10-CM | POA: Diagnosis not present

## 2019-05-25 DIAGNOSIS — M25512 Pain in left shoulder: Secondary | ICD-10-CM | POA: Diagnosis not present

## 2019-05-25 DIAGNOSIS — M25612 Stiffness of left shoulder, not elsewhere classified: Secondary | ICD-10-CM | POA: Diagnosis not present

## 2019-06-01 DIAGNOSIS — M25512 Pain in left shoulder: Secondary | ICD-10-CM | POA: Diagnosis not present

## 2019-06-01 DIAGNOSIS — M25612 Stiffness of left shoulder, not elsewhere classified: Secondary | ICD-10-CM | POA: Diagnosis not present

## 2019-06-03 DIAGNOSIS — M25512 Pain in left shoulder: Secondary | ICD-10-CM | POA: Diagnosis not present

## 2019-06-03 DIAGNOSIS — M25612 Stiffness of left shoulder, not elsewhere classified: Secondary | ICD-10-CM | POA: Diagnosis not present

## 2019-06-10 DIAGNOSIS — M25612 Stiffness of left shoulder, not elsewhere classified: Secondary | ICD-10-CM | POA: Diagnosis not present

## 2019-06-10 DIAGNOSIS — M25512 Pain in left shoulder: Secondary | ICD-10-CM | POA: Diagnosis not present

## 2019-06-14 DIAGNOSIS — M25612 Stiffness of left shoulder, not elsewhere classified: Secondary | ICD-10-CM | POA: Diagnosis not present

## 2019-06-14 DIAGNOSIS — M25512 Pain in left shoulder: Secondary | ICD-10-CM | POA: Diagnosis not present

## 2019-06-15 DIAGNOSIS — M25512 Pain in left shoulder: Secondary | ICD-10-CM | POA: Diagnosis not present

## 2019-06-16 DIAGNOSIS — M25512 Pain in left shoulder: Secondary | ICD-10-CM | POA: Diagnosis not present

## 2019-06-16 DIAGNOSIS — M25612 Stiffness of left shoulder, not elsewhere classified: Secondary | ICD-10-CM | POA: Diagnosis not present

## 2019-06-30 DIAGNOSIS — M7502 Adhesive capsulitis of left shoulder: Secondary | ICD-10-CM | POA: Diagnosis not present

## 2019-07-21 DIAGNOSIS — M7542 Impingement syndrome of left shoulder: Secondary | ICD-10-CM | POA: Diagnosis not present

## 2019-07-26 ENCOUNTER — Ambulatory Visit (INDEPENDENT_AMBULATORY_CARE_PROVIDER_SITE_OTHER): Payer: BC Managed Care – PPO | Admitting: Registered Nurse

## 2019-07-26 ENCOUNTER — Encounter: Payer: Self-pay | Admitting: Registered Nurse

## 2019-07-26 ENCOUNTER — Other Ambulatory Visit: Payer: Self-pay

## 2019-07-26 VITALS — BP 149/92 | HR 78 | Temp 98.2°F | Ht 73.0 in | Wt 264.4 lb

## 2019-07-26 DIAGNOSIS — Z1329 Encounter for screening for other suspected endocrine disorder: Secondary | ICD-10-CM

## 2019-07-26 DIAGNOSIS — Z7189 Other specified counseling: Secondary | ICD-10-CM | POA: Diagnosis not present

## 2019-07-26 DIAGNOSIS — Z130 Encounter for screening for diseases of the blood and blood-forming organs and certain disorders involving the immune mechanism: Secondary | ICD-10-CM | POA: Diagnosis not present

## 2019-07-26 DIAGNOSIS — Z13228 Encounter for screening for other metabolic disorders: Secondary | ICD-10-CM

## 2019-07-26 DIAGNOSIS — Z13220 Encounter for screening for lipoid disorders: Secondary | ICD-10-CM

## 2019-07-26 DIAGNOSIS — Z23 Encounter for immunization: Secondary | ICD-10-CM | POA: Diagnosis not present

## 2019-07-26 DIAGNOSIS — Z7689 Persons encountering health services in other specified circumstances: Secondary | ICD-10-CM

## 2019-07-26 NOTE — Patient Instructions (Signed)
° ° ° °  If you have lab work done today you will be contacted with your lab results within the next 2 weeks.  If you have not heard from us then please contact us. The fastest way to get your results is to register for My Chart. ° ° °IF you received an x-ray today, you will receive an invoice from [facility]. Please contact [facility] at [phone] with questions or concerns regarding your invoice.  ° °IF you received labwork today, you will receive an invoice from [facility]. Please contact [facility] at [phone] with questions or concerns regarding your invoice.  ° °Our billing staff will not be able to assist you with questions regarding bills from these companies. ° °You will be contacted with the lab results as soon as they are available. The fastest way to get your results is to activate your My Chart account. Instructions are located on the last page of this paperwork. If you have not heard from us regarding the results in 2 weeks, please contact this office. °  ° ° ° °

## 2019-07-27 ENCOUNTER — Encounter: Payer: Self-pay | Admitting: Radiology

## 2019-07-27 LAB — TSH: TSH: 1.28 u[IU]/mL (ref 0.450–4.500)

## 2019-07-27 LAB — HEMOGLOBIN A1C
Est. average glucose Bld gHb Est-mCnc: 114 mg/dL
Hgb A1c MFr Bld: 5.6 % (ref 4.8–5.6)

## 2019-07-27 LAB — COMPREHENSIVE METABOLIC PANEL
ALT: 22 IU/L (ref 0–44)
AST: 23 IU/L (ref 0–40)
Albumin/Globulin Ratio: 1.7 (ref 1.2–2.2)
Albumin: 4.8 g/dL (ref 3.8–4.9)
Alkaline Phosphatase: 58 IU/L (ref 39–117)
BUN/Creatinine Ratio: 22 — ABNORMAL HIGH (ref 9–20)
BUN: 17 mg/dL (ref 6–24)
Bilirubin Total: 0.4 mg/dL (ref 0.0–1.2)
CO2: 21 mmol/L (ref 20–29)
Calcium: 9.7 mg/dL (ref 8.7–10.2)
Chloride: 101 mmol/L (ref 96–106)
Creatinine, Ser: 0.76 mg/dL (ref 0.76–1.27)
GFR calc Af Amer: 117 mL/min/{1.73_m2} (ref >59)
GFR calc non Af Amer: 101 mL/min/{1.73_m2} (ref >59)
Globulin, Total: 2.9 g/dL (ref 1.5–4.5)
Glucose: 92 mg/dL (ref 65–99)
Potassium: 4.3 mmol/L (ref 3.5–5.2)
Sodium: 140 mmol/L (ref 134–144)
Total Protein: 7.7 g/dL (ref 6.0–8.5)

## 2019-07-27 LAB — CBC
Hematocrit: 45.7 % (ref 37.5–51.0)
Hemoglobin: 16.1 g/dL (ref 13.0–17.7)
MCH: 30.5 pg (ref 26.6–33.0)
MCHC: 35.2 g/dL (ref 31.5–35.7)
MCV: 87 fL (ref 79–97)
Platelets: 292 10*3/uL (ref 150–450)
RBC: 5.28 x10E6/uL (ref 4.14–5.80)
RDW: 13 % (ref 11.6–15.4)
WBC: 8.1 10*3/uL (ref 3.4–10.8)

## 2019-07-27 LAB — LIPID PANEL
Chol/HDL Ratio: 2.9 ratio (ref 0.0–5.0)
Cholesterol, Total: 175 mg/dL (ref 100–199)
HDL: 61 mg/dL (ref >39)
LDL Chol Calc (NIH): 89 mg/dL (ref 0–99)
Triglycerides: 144 mg/dL (ref 0–149)
VLDL Cholesterol Cal: 25 mg/dL (ref 5–40)

## 2019-07-27 NOTE — Progress Notes (Signed)
Good morning,  If we could send a normal results letter to Billy Roman, that would be great.   Thank you  Kathrin Ruddy, NP

## 2019-07-30 ENCOUNTER — Encounter: Payer: Self-pay | Admitting: Registered Nurse

## 2019-07-30 NOTE — Progress Notes (Signed)
New Patient Office Visit  Subjective:  Patient ID: Billy Roman, male    DOB: 1960/09/30  Age: 59 y.o. MRN: [id number]  CC:  Chief Complaint  Patient presents with  . New Patient (Initial Visit)    establish care .just wanna know his risk for covid assesment    HPI Billy Roman presents for visit to establish care and medical counseling.  Notes that he has concern for COVID-19 infection and is hoping to continue working from home part time to limit his risk. He is an overweight male at age 63, and while this does not place him at a particularly high risk, he states that he has concern for his coworkers' handling of COVID precautions and a number of cases that have occurred in his workplace.  Otherwise, no complaints. Feeling well overall. Interested in getting the COVID vaccine and hoping to travel once COVID is no longer a concern. Visits [place] frequently   History reviewed. No pertinent past medical history.  Past Surgical History:  Procedure Laterality Date  . SPINE SURGERY    . WRIST SURGERY      History reviewed. No pertinent family history.  Social History   Socioeconomic History  . Marital status: Married    Spouse name: Not on file  . Number of children: Not on file  . Years of education: Not on file  . Highest education level: Not on file  Occupational History  . Not on file  Tobacco Use  . Smoking status: Former Research scientist (life sciences)  . Smokeless tobacco: Never Used  Substance and Sexual Activity  . Alcohol use: No    Alcohol/week: 0.0 standard drinks  . Drug use: No  . Sexual activity: Not Currently  Other Topics Concern  . Not on file  Social History Narrative  . Not on file   Social Determinants of Health   Financial Resource Strain:   . Difficulty of Paying Living Expenses: Not on file  Food Insecurity:   . Worried About Charity fundraiser in the Last Year: Not on file  . Ran Out of Food in the Last Year: Not on file  Transportation Needs:     . Lack of Transportation (Medical): Not on file  . Lack of Transportation (Non-Medical): Not on file  Physical Activity:   . Days of Exercise per Week: Not on file  . Minutes of Exercise per Session: Not on file  Stress:   . Feeling of Stress : Not on file  Social Connections:   . Frequency of Communication with Friends and Family: Not on file  . Frequency of Social Gatherings with Friends and Family: Not on file  . Attends Religious Services: Not on file  . Active Member of Clubs or Organizations: Not on file  . Attends Archivist Meetings: Not on file  . Marital Status: Not on file  Intimate Partner Violence:   . Fear of Current or Ex-Partner: Not on file  . Emotionally Abused: Not on file  . Physically Abused: Not on file  . Sexually Abused: Not on file    ROS Review of Systems  Constitutional: Negative.   HENT: Negative.   Eyes: Negative.   Respiratory: Negative.   Cardiovascular: Negative.   Gastrointestinal: Negative.   Endocrine: Negative.   Genitourinary: Negative.   Musculoskeletal: Negative.   Skin: Negative.   Allergic/Immunologic: Negative.   Neurological: Negative.   Hematological: Negative.   Psychiatric/Behavioral: Negative.   All other systems reviewed and are negative.  Objective:   Today's Vitals: BP (!) 149/92   Pulse 78   Temp 98.2 F (36.8 C) (Temporal)   Ht 6\' 1"  (1.854 m)   Wt 264 lb 6.4 oz (119.9 kg)   SpO2 98%   BMI 34.88 kg/m   Physical Exam Vitals reviewed.  Constitutional:      General: He is not in acute distress.    Appearance: Normal appearance. He is obese. He is not ill-appearing, toxic-appearing or diaphoretic.  Cardiovascular:     Rate and Rhythm: Normal rate and regular rhythm.     Pulses: Normal pulses.     Heart sounds: Normal heart sounds. No murmur. No friction rub. No gallop.   Pulmonary:     Effort: Pulmonary effort is normal. No respiratory distress.     Breath sounds: Normal breath sounds. No  stridor. No wheezing, rhonchi or rales.  Chest:     Chest wall: No tenderness.  Skin:    Capillary Refill: Capillary refill takes less than 2 seconds.  Neurological:     General: No focal deficit present.     Mental Status: He is alert and oriented to person, place, and time. Mental status is at baseline.  Psychiatric:        Mood and Affect: Mood normal.        Behavior: Behavior normal.        Thought Content: Thought content normal.        Judgment: Judgment normal.     Assessment & Plan:   Problem List Items Addressed This Visit    None    Visit Diagnoses    Screening for endocrine, metabolic and immunity disorder    -  Primary   Relevant Orders   TSH (Completed)   Comprehensive metabolic panel (Completed)   Hemoglobin A1c (Completed)   CBC (Completed)   Flu vaccine need       Relevant Orders   Flu Vaccine QUAD 36+ mos IM (Completed)   Lipid screening       Relevant Orders   Lipid Panel (Completed)      Outpatient Encounter Medications as of 07/26/2019  Medication Sig  . clotrimazole-betamethasone (LOTRISONE) cream Apply 1 application topically 2 (two) times daily. (Patient not taking: Reported on 07/26/2019)  . diazepam (VALIUM) 5 MG tablet Take 5 mg by mouth every 6 (six) hours as needed for anxiety.  . fluticasone (FLONASE) 50 MCG/ACT nasal spray Place 2 sprays into both nostrils daily.  . Guaifenesin (MUCINEX MAXIMUM STRENGTH) 1200 MG TB12 Take 1 tablet (1,200 mg total) by mouth every 12 (twelve) hours as needed. (Patient not taking: Reported on 07/26/2019)  . predniSONE (DELTASONE) 10 MG tablet Take 4  a  day for 3 days 3 a day for 3 days 2 a day for 3 days one a day for 3 days (Patient not taking: Reported on 04/18/2016)  . silver sulfADIAZINE (SILVADENE) 1 % cream Apply 1 application topically daily. To lower leg (Patient not taking: Reported on 04/18/2016)   No facility-administered encounter medications on file as of 07/26/2019.    Follow-up: No follow-ups on file.    PLAN  I have written Mr. Pulkrabek a note that expresses his concern and my belief that he should be able to work from home part time until he is able to be vaccinated against the COVID-19 virus.  Otherwise, no major concerns today - drew labs, will follow up as warranted. Suggest that he return for CPE at his convenience.  Patient encouraged to  call clinic with any questions, comments, or concerns.  I spent 40 minutes with this patient, more than 50% of which was spent educating/counseling Maximiano Coss, NP

## 2019-08-02 DIAGNOSIS — M7542 Impingement syndrome of left shoulder: Secondary | ICD-10-CM | POA: Diagnosis not present

## 2019-08-09 ENCOUNTER — Telehealth: Payer: Self-pay | Admitting: Registered Nurse

## 2019-08-09 NOTE — Telephone Encounter (Signed)
PATIENT DROPPED OFF PAPERWORK TO ALLOW HIM YO WORK FROM HOME / PLACED IN CMA/PROVIDER BOX AT [place]

## 2019-08-10 ENCOUNTER — Encounter: Payer: Self-pay | Admitting: Registered Nurse

## 2019-08-11 NOTE — Telephone Encounter (Signed)
I did - I may have placed it in the fax box last night, but it has been signed.  Thank you  Kathrin Ruddy, NP

## 2019-08-11 NOTE — Telephone Encounter (Signed)
Billy Roman did you sign the paperwork for this patient I they were in the back .  Please Advise

## 2019-08-19 ENCOUNTER — Encounter: Payer: Self-pay | Admitting: Registered Nurse

## 2019-08-19 NOTE — Telephone Encounter (Signed)
Please Advise

## 2019-08-20 ENCOUNTER — Encounter: Payer: Self-pay | Admitting: Registered Nurse

## 2019-09-13 DIAGNOSIS — M7542 Impingement syndrome of left shoulder: Secondary | ICD-10-CM | POA: Diagnosis not present

## 2020-01-24 DIAGNOSIS — M7541 Impingement syndrome of right shoulder: Secondary | ICD-10-CM | POA: Diagnosis not present

## 2020-03-27 DIAGNOSIS — M25511 Pain in right shoulder: Secondary | ICD-10-CM | POA: Diagnosis not present

## 2020-04-14 DIAGNOSIS — M25511 Pain in right shoulder: Secondary | ICD-10-CM | POA: Diagnosis not present

## 2020-04-19 DIAGNOSIS — M75121 Complete rotator cuff tear or rupture of right shoulder, not specified as traumatic: Secondary | ICD-10-CM | POA: Diagnosis not present

## 2020-04-19 DIAGNOSIS — M7541 Impingement syndrome of right shoulder: Secondary | ICD-10-CM | POA: Diagnosis not present

## 2020-05-25 ENCOUNTER — Other Ambulatory Visit: Payer: Self-pay

## 2020-05-30 ENCOUNTER — Other Ambulatory Visit: Admission: RE | Admit: 2020-05-30 | Payer: Self-pay | Source: Ambulatory Visit

## 2020-06-01 ENCOUNTER — Ambulatory Visit: Admit: 2020-06-01 | Payer: Self-pay | Admitting: Orthopedic Surgery

## 2020-06-01 SURGERY — SHOULDER ARTHROSCOPY WITH ROTATOR CUFF REPAIR AND SUBACROMIAL DECOMPRESSION
Anesthesia: General | Laterality: Right

## 2020-06-21 DIAGNOSIS — U071 COVID-19: Secondary | ICD-10-CM | POA: Diagnosis not present

## 2020-06-26 ENCOUNTER — Ambulatory Visit: Payer: BC Managed Care – PPO

## 2020-06-27 DIAGNOSIS — Z20822 Contact with and (suspected) exposure to covid-19: Secondary | ICD-10-CM | POA: Diagnosis not present

## 2020-08-02 DIAGNOSIS — F331 Major depressive disorder, recurrent, moderate: Secondary | ICD-10-CM | POA: Diagnosis not present

## 2020-08-18 DIAGNOSIS — F331 Major depressive disorder, recurrent, moderate: Secondary | ICD-10-CM | POA: Diagnosis not present

## 2020-08-21 DIAGNOSIS — F331 Major depressive disorder, recurrent, moderate: Secondary | ICD-10-CM | POA: Diagnosis not present

## 2020-08-25 ENCOUNTER — Ambulatory Visit
Admission: EM | Admit: 2020-08-25 | Discharge: 2020-08-25 | Disposition: A | Discharge status: Home / Self Care | Payer: BC Managed Care – PPO | Attending: Family Medicine | Admitting: Family Medicine

## 2020-08-25 ENCOUNTER — Other Ambulatory Visit: Payer: Self-pay

## 2020-08-25 ENCOUNTER — Encounter: Payer: Self-pay | Admitting: Emergency Medicine

## 2020-08-25 DIAGNOSIS — H9201 Otalgia, right ear: Secondary | ICD-10-CM

## 2020-08-25 DIAGNOSIS — J069 Acute upper respiratory infection, unspecified: Secondary | ICD-10-CM | POA: Diagnosis not present

## 2020-08-25 MED ORDER — PREDNISONE 20 MG PO TABS: 40.0000 mg | ORAL_TABLET | Freq: Every day | ORAL | 0 refills | Status: DC

## 2020-08-25 MED ORDER — FLUTICASONE PROPIONATE 50 MCG/ACT NA SUSP: 2.0000 | Freq: Every day | NASAL | 0 refills | Status: DC

## 2020-08-25 NOTE — ED Provider Notes (Signed)
[facility]    CSN: [id number] Arrival date & time: 08/25/20  1657      History   Chief Complaint Chief Complaint  Patient presents with  . Otalgia    HPI Billy Roman is a 60 y.o. male.   HPI Patient presents today with 4 days of right-sided ear pain, congestion, pain radiating from the ear into the right frontal and some generalized fatigue x4 days.  Patient felt warm yesterday however did not check his temperature so is uncertain of fever.  Uncertain of sick contacts.  Does not have any difficulty breathing or coughing.  Reports that he develops sinus type symptoms annually.  He endorses some throat discomfort with swallowing and some tenderness of the left cervical lymph node.- History reviewed. No pertinent past medical history.  Patient Active Problem List   Diagnosis Date Noted  . DEPRESSION 08/23/2009  . CHEST PAIN UNSPECIFIED 08/23/2009    Past Surgical History:  Procedure Laterality Date  . SPINE SURGERY    . WRIST SURGERY         Home Medications    Prior to Admission medications   Medication Sig Start Date End Date Taking? Authorizing Provider  fluticasone (FLONASE) 50 MCG/ACT nasal spray Place 2 sprays into both nostrils daily. 08/25/20  Yes Scot Jun, FNP  predniSONE (DELTASONE) 20 MG tablet Take 2 tablets (40 mg total) by mouth daily with breakfast. 08/25/20  Yes Scot Jun, FNP  clotrimazole-betamethasone (LOTRISONE) cream Apply 1 application topically 2 (two) times daily. Patient not taking: Reported on 07/26/2019 04/18/16   Ivar Drape D, PA  diazepam (VALIUM) 5 MG tablet Take 5 mg by mouth every 6 (six) hours as needed for anxiety.    [provider]  Guaifenesin (MUCINEX MAXIMUM STRENGTH) 1200 MG TB12 Take 1 tablet (1,200 mg total) by mouth every 12 (twelve) hours as needed. Patient not taking: Reported on 07/26/2019 04/18/16   Ivar Drape D, PA  silver sulfADIAZINE (SILVADENE) 1 % cream Apply 1  application topically daily. To lower leg Patient not taking: Reported on 04/18/2016 01/10/16   Joretta Bachelor, PA    Family History History reviewed. No pertinent family history.  Social History Social History   Tobacco Use  . Smoking status: Former Research scientist (life sciences)  . Smokeless tobacco: Never Used  Substance Use Topics  . Alcohol use: No    Alcohol/week: 0.0 standard drinks  . Drug use: No     Allergies   Propyphenazone   Review of Systems Review of Systems Pertinent negatives listed in HPI  Physical Exam Triage Vital Signs ED Triage Vitals [08/25/20 1837]  Enc Vitals Group     BP (!) 159/84     Pulse Rate 76     Resp 18     Temp 97.8 F (36.6 C)     Temp Source Oral     SpO2 97 %     Weight      Height      Head Circumference      Peak Flow      Pain Score 5     Pain Loc      Pain Edu?      Excl. in [place]?    No data found.  Updated Vital Signs BP (!) 159/84 (BP Location: Left Arm)   Pulse 76   Temp 97.8 F (36.6 C) (Oral)   Resp 18   SpO2 97%   Visual Acuity Right Eye Distance:   Left Eye Distance:  Bilateral Distance:    Right Eye Near:   Left Eye Near:    Bilateral Near:     Physical Exam  General Appearance:    Alert, cooperative, no distress  HENT:   Normocephalic, Right ear effusion present,  Left ear normal, nares mucosal edema with congestion increased left nares compared to right, rhinorrhea, oropharynx clear with left adenopathy present with tenderness  Eyes:    PERRL, conjunctiva/corneas clear, EOM's intact       Lungs:     Clear to auscultation bilaterally, respirations unlabored  Heart:    Regular rate and rhythm  Neurologic:   Awake, alert, oriented x 3. No apparent focal neurological           defect.     UC Treatments / Results  Labs (all labs ordered are listed, but only abnormal results are displayed) Labs Reviewed - No data to display  EKG   Radiology No results found.  Procedures Procedures (including critical  care time)  Medications Ordered in UC Medications - No data to display  Initial Impression / Assessment and Plan / UC Course  I have reviewed the triage vital signs and the nursing notes.  Pertinent labs & imaging results that were available during my care of the patient were reviewed by me and considered in my medical decision making (see chart for details).    Viral upper respiratory infection with right ear otalgia.  Right ear is not infected clear fluid consistent with that of a middle ear effusion present.  Symptoms likely related to seasonal dry allergens.  Patient does have left cervical adenopathy with throat pain however no oropharyngeal swelling or redness.  Will cover today with prednisone 40 mg once daily for 5 days for both inner ear and upper respiratory congestion type symptoms along with throat pain.  Advised patient to resume Flonase 2 sprays daily for preventative maintenance of outdoor allergy and sinus related symptoms. Final Clinical Impressions(s) / UC Diagnoses   Final diagnoses:  Viral upper respiratory infection  Right ear pain   Discharge Instructions   None    ED Prescriptions    Medication Sig Dispense Auth. Provider   predniSONE (DELTASONE) 20 MG tablet Take 2 tablets (40 mg total) by mouth daily with breakfast. 10 tablet Scot Jun, FNP   fluticasone (FLONASE) 50 MCG/ACT nasal spray Place 2 sprays into both nostrils daily. 16 g Scot Jun, FNP     PDMP not reviewed this encounter.   Scot Jun, FNP 08/25/20 1929

## 2020-08-25 NOTE — ED Triage Notes (Signed)
Pt sts right sided ear pain into jaw with some fatigue x 4 days

## 2020-08-28 ENCOUNTER — Ambulatory Visit: Payer: BC Managed Care – PPO

## 2020-08-30 ENCOUNTER — Other Ambulatory Visit: Payer: Self-pay | Admitting: Family Medicine

## 2020-08-31 MED ORDER — FLUTICASONE PROPIONATE 50 MCG/ACT NA SUSP: 2.0000 | Freq: Every day | NASAL | 0 refills | Status: DC

## 2020-08-31 NOTE — Telephone Encounter (Signed)
Please advise on refills, has not been seen in over a year

## 2020-09-01 DIAGNOSIS — F331 Major depressive disorder, recurrent, moderate: Secondary | ICD-10-CM | POA: Diagnosis not present

## 2020-09-12 DIAGNOSIS — F331 Major depressive disorder, recurrent, moderate: Secondary | ICD-10-CM | POA: Diagnosis not present

## 2020-09-21 DIAGNOSIS — F331 Major depressive disorder, recurrent, moderate: Secondary | ICD-10-CM | POA: Diagnosis not present

## 2020-10-01 DIAGNOSIS — F331 Major depressive disorder, recurrent, moderate: Secondary | ICD-10-CM | POA: Diagnosis not present

## 2020-10-05 DIAGNOSIS — F331 Major depressive disorder, recurrent, moderate: Secondary | ICD-10-CM | POA: Diagnosis not present

## 2020-10-23 DIAGNOSIS — Z20822 Contact with and (suspected) exposure to covid-19: Secondary | ICD-10-CM | POA: Diagnosis not present

## 2020-10-30 DIAGNOSIS — R3915 Urgency of urination: Secondary | ICD-10-CM | POA: Diagnosis not present

## 2020-10-30 DIAGNOSIS — N401 Enlarged prostate with lower urinary tract symptoms: Secondary | ICD-10-CM | POA: Diagnosis not present

## 2020-11-01 DIAGNOSIS — F331 Major depressive disorder, recurrent, moderate: Secondary | ICD-10-CM | POA: Diagnosis not present

## 2020-11-06 DIAGNOSIS — R3915 Urgency of urination: Secondary | ICD-10-CM | POA: Diagnosis not present

## 2020-11-06 DIAGNOSIS — N5201 Erectile dysfunction due to arterial insufficiency: Secondary | ICD-10-CM | POA: Diagnosis not present

## 2020-11-06 DIAGNOSIS — Z125 Encounter for screening for malignant neoplasm of prostate: Secondary | ICD-10-CM | POA: Diagnosis not present

## 2020-11-06 DIAGNOSIS — N401 Enlarged prostate with lower urinary tract symptoms: Secondary | ICD-10-CM | POA: Diagnosis not present

## 2020-12-03 NOTE — Progress Notes (Signed)
Virtual Visit via Telephone Note  I connected with Billy Roman, on 12/04/2020 at 9:54 AM by telephone due to the COVID-19 pandemic and verified that I am speaking with the correct person using two identifiers.  Due to current restrictions/limitations of in-office visits due to the COVID-19 pandemic, this scheduled clinical appointment was converted to a telehealth visit.   Consent: I discussed the limitations, risks, security and privacy concerns of performing an evaluation and management service by telephone and the availability of in person appointments. I also discussed with the patient that there may be a patient responsible charge related to this service. The patient expressed understanding and agreed to proceed.   Location of Patient: Home  Location of Provider: [facility] [place] participating in Telemedicine visit: [place], NP Elmon Else, CMA  History of Present Illness: Billy Roman is to establish care. Patient has a PMH significant for depression and chest pain unspecified.   Current issues and/or concerns: Concern for fatigue for months. Reports received Pfizer Covid vaccines in 2020. Has not received Covid boosters as of yet. Did have Covid in January 2022 and managed at home during that time. Initially thought some of the fatigue came from shoulder surgery in December 2020. Was told by his surgeon the fatigue from surgery was likely not related to fatigue because it had been some time since the surgery.   Reports fatigue is intermittent and has improved some since it began but still not completely resolved. Reports some days barely able to get up and function. He does work in an office. Sometimes has to get up throughout the work day and walk around to see if that helps. Caffeine not helping. Taking multi-vitamin. No longer a smoker. Reports never feeling rested. Did have blood work related to  fatigue in February 2021 and everything normal at that time. Reports in the past given medication to help with sleep (not sure of name).   No past medical history on file. Allergies  Allergen Reactions   Propyphenazone Itching and Rash    Current Outpatient Medications on File Prior to Visit  Medication Sig Dispense Refill   clotrimazole-betamethasone (LOTRISONE) cream Apply 1 application topically 2 (two) times daily. (Patient not taking: Reported on 07/26/2019) 15 g 0   diazepam (VALIUM) 5 MG tablet Take 5 mg by mouth every 6 (six) hours as needed for anxiety.     fluticasone (FLONASE) 50 MCG/ACT nasal spray Place 2 sprays into both nostrils daily. 16 g 0   Guaifenesin (MUCINEX MAXIMUM STRENGTH) 1200 MG TB12 Take 1 tablet (1,200 mg total) by mouth every 12 (twelve) hours as needed. (Patient not taking: Reported on 07/26/2019) 14 tablet 1   predniSONE (DELTASONE) 20 MG tablet Take 2 tablets (40 mg total) by mouth daily with breakfast. 10 tablet 0   silver sulfADIAZINE (SILVADENE) 1 % cream Apply 1 application topically daily. To lower leg (Patient not taking: Reported on 04/18/2016) 50 g 0   No current facility-administered medications on file prior to visit.    Observations/Objective: Alert and oriented x 3. Not in acute distress. Physical examination not completed as this is a telemedicine visit.  Assessment and Plan: 1. Encounter to establish care: - Patient presents today to establish care.  - Return for annual physical examination, labs, and health maintenance. Arrive fasting meaning having no food for at least 8 hours prior to appointment. You may have only water or black coffee. Please take  scheduled medications as normal.  2. Chronic fatigue: - Counseled patient will repeat labs for fatigue at next in-person office visit. Patient agreeable.    Follow Up Instructions: Return for annual physical exams and labs.    Patient was given clear instructions to go to Emergency Department  or return to medical center if symptoms don't improve, worsen, or new problems develop.The patient verbalized understanding.  I discussed the assessment and treatment plan with the patient. The patient was provided an opportunity to ask questions and all were answered. The patient agreed with the plan and demonstrated an understanding of the instructions.   The patient was advised to call back or seek an in-person evaluation if the symptoms worsen or if the condition fails to improve as anticipated.    I provided 20 minutes total of non-face-to-face time during this encounter.   Camillia Herter, NP  [facility] [place], [place] 12/04/2020, 9:54 AM

## 2020-12-04 ENCOUNTER — Telehealth (INDEPENDENT_AMBULATORY_CARE_PROVIDER_SITE_OTHER): Payer: BC Managed Care – PPO | Admitting: Family

## 2020-12-04 ENCOUNTER — Other Ambulatory Visit: Payer: Self-pay

## 2020-12-04 DIAGNOSIS — Z7689 Persons encountering health services in other specified circumstances: Secondary | ICD-10-CM

## 2020-12-04 DIAGNOSIS — R5382 Chronic fatigue, unspecified: Secondary | ICD-10-CM | POA: Diagnosis not present

## 2020-12-07 DIAGNOSIS — F331 Major depressive disorder, recurrent, moderate: Secondary | ICD-10-CM | POA: Diagnosis not present

## 2020-12-21 DIAGNOSIS — F331 Major depressive disorder, recurrent, moderate: Secondary | ICD-10-CM | POA: Diagnosis not present

## 2021-01-13 NOTE — Progress Notes (Signed)
Patient ID: BLADYN TIPPS, male    DOB: 1960/12/02  MRN: [id number]  CC: Annual Physical Exam  Subjective: Billy Roman is a 60 y.o. male who presents for annual physical exam.   His concerns today include:  Reports anxiety depression is primarily related to his divorce which happened in 2021. Since then he is feeling better overall.   However, endorses fatigue and difficulty sleeping at bedtime. In the past took Trazodone for sleep which helped. Would like to try again. Reports he finds himself drinking more alcohol in hopes of getting to sleep during the night. Denies that he would ever drink alcohol and take Trazodone at the same time. In the past had a sleep study and was told he did not snore enough so was denied CPAP. Feels sleep apnea may be contributing to insomnia.    Expresses that he is adopted so he is unsure of his biological family history in regards to fatigue and how this may be affecting him currently. He lives alone and does not have any friends around.   Participating in counseling services with a Education officer, museum and going well. He is not ready for referral to Psychiatry. He is not ready for anxiety/depression medications. Denies thoughts of self-harm, suicidal ideations, and homicidal ideations.   Would like to get Tdap vaccine today. Hesitant on Shingles vaccines because of side effects.   Planning to have a left shoulder surgery soon. Reports he will eventually need to have right shoulder surgery as well. Reports he is a smoker for 30 years.    Depression screen [facility] 2/9 01/15/2021 07/26/2019 04/18/2016 01/10/2016 10/08/2015  Decreased Interest 3 0 0 0 0  Down, Depressed, Hopeless 2 0 0 0 0  PHQ - 2 Score 5 0 0 0 0  Altered sleeping 3 - - - -  Tired, decreased energy 3 - - - -  Change in appetite 2 - - - -  Feeling bad or failure about yourself  2 - - - -  Trouble concentrating 0 - - - -  Moving slowly or fidgety/restless 2 - - - -  Suicidal thoughts 0 - - -  -  PHQ-9 Score 17 - - - -  Difficult doing work/chores Very difficult - - - -    Patient Active Problem List   Diagnosis Date Noted   HNP (herniated nucleus pulposus), lumbar 01/15/2021   Sciatica 01/15/2021   Anxiety and depression 08/23/2009     Current Outpatient Medications on File Prior to Visit  Medication Sig Dispense Refill   fluticasone (FLONASE) 50 MCG/ACT nasal spray Place into both nostrils daily as needed for allergies or rhinitis.     No current facility-administered medications on file prior to visit.    Allergies  Allergen Reactions   Propyphenazone Itching, Rash and Anaphylaxis    Social History   Socioeconomic History   Marital status: Single    Spouse name: Not on file   Number of children: Not on file   Years of education: Not on file   Highest education level: Not on file  Occupational History   Not on file  Tobacco Use   Smoking status: Former   Smokeless tobacco: Never  Substance and Sexual Activity   Alcohol use: No    Alcohol/week: 0.0 standard drinks   Drug use: No   Sexual activity: Not Currently  Other Topics Concern   Not on file  Social History Narrative   Not on file   Social Determinants  of Health   Financial Resource Strain: Not on file  Food Insecurity: Not on file  Transportation Needs: Not on file  Physical Activity: Not on file  Stress: Not on file  Social Connections: Not on file  Intimate Partner Violence: Not on file    No family history on file.  Past Surgical History:  Procedure Laterality Date   SPINE SURGERY     WRIST SURGERY      ROS: Review of Systems Negative except as stated above  PHYSICAL EXAM: BP 122/72 (BP Location: Left Arm, Patient Position: Sitting, Cuff Size: Large)   Pulse 87   Temp 98.3 F (36.8 C)   Resp 18   Ht 6' 0.8" (1.849 m)   Wt 247 lb 6.4 oz (112.2 kg)   SpO2 96%   BMI 32.82 kg/m   Physical Exam HENT:     Head: Normocephalic and atraumatic.     Right Ear: Tympanic  membrane, ear canal and external ear normal.     Left Ear: Tympanic membrane, ear canal and external ear normal.  Eyes:     Extraocular Movements: Extraocular movements intact.     Conjunctiva/sclera: Conjunctivae normal.     Pupils: Pupils are equal, round, and reactive to light.  Cardiovascular:     Rate and Rhythm: Normal rate and regular rhythm.     Pulses: Normal pulses.     Heart sounds: Normal heart sounds.  Pulmonary:     Effort: Pulmonary effort is normal.     Breath sounds: Normal breath sounds.  Abdominal:     General: Bowel sounds are normal.     Palpations: Abdomen is soft.  Genitourinary:    Comments: Patient declined exam.  Musculoskeletal:        General: Normal range of motion.     Cervical back: Normal range of motion and neck supple.  Skin:    General: Skin is warm and dry.     Capillary Refill: Capillary refill takes less than 2 seconds.  Neurological:     General: No focal deficit present.     Mental Status: He is alert and oriented to person, place, and time.  Psychiatric:        Mood and Affect: Mood normal.        Behavior: Behavior normal.     ASSESSMENT AND PLAN: 1. Annual physical exam: - Counseled on 150 minutes of exercise per week as tolerated, healthy eating (including decreased daily intake of saturated fats, cholesterol, added sugars, sodium), STI prevention, and routine healthcare maintenance.  2. Screening for metabolic disorder: - ZOX09+UEAV to check kidney function, liver function, and electrolyte balance.  - CMP14+EGFR  3. Screening for deficiency anemia: - CBC to screen for anemia. - CBC  4. Diabetes mellitus screening: - Hemoglobin A1c to screen for pre-diabetes/diabetes. - Hemoglobin A1c  5. Screening cholesterol level: - Lipid panel to screen for high cholesterol.  - Lipid panel  6. Thyroid disorder screen: - TSH to check thyroid function.  - TSH  7. Need for hepatitis C screening test: - Hepatitis C antibody to  screen for hepatitis C.  - Hepatitis C Antibody  8. Encounter for screening for HIV: - HIV antibody to screen for human immunodeficiency virus.  - HIV antibody (with reflex)  9. Colon cancer screening: - Referral to Gastroenterology for colon cancer screening by colonoscopy. - Ambulatory referral to Gastroenterology  10. Encounter for vitamin deficiency screening: - Screening for vitamin deficiencies.  - Vitamin D, 25-hydroxy - Vitamin B12  11. Need for Tdap vaccination: - Administered today in office.  - Tdap vaccine greater than or equal to 7yo IM  12. Need for shingles vaccine: - Patient declined.   13. Anxiety and depression: - Patient denies thoughts of self-harm, suicidal ideations, and homicidal ideations.  - Patient declined pharmacological therapy.  - Patient declined referral to Psychiatry.  - Continue counseling sessions with Education officer, museum.  - Follow-up with primary provider as scheduled.   14. Insomnia, unspecified type: 15. Snoring: - Begin Trazodone as prescribed. May cause drowsiness. Counseled patient to not consume if operating heavy machinery or driving. Counseled patient to not consume with alcohol or illicit substances. Patient verbalized understanding.  - Sleep study for further evaluation and management.  - Follow-up with primary provider in 4 weeks or sooner if needed.  - traZODone (DESYREL) 50 MG tablet; Take 0.5 tablets (25 mg total) by mouth at bedtime.  Dispense: 15 tablet; Refill: 0 - [facility] Sleep Study; Future    Patient was given the opportunity to ask questions.  Patient verbalized understanding of the plan and was able to repeat key elements of the plan. Patient was given clear instructions to go to Emergency Department or return to medical center if symptoms don't improve, worsen, or new problems develop.The patient verbalized understanding.   Orders Placed This Encounter  Procedures   Tdap vaccine greater than or equal to 7yo IM   Hepatitis  C Antibody   HIV antibody (with reflex)   CBC   Lipid panel   TSH   Hemoglobin A1c   CMP14+EGFR   Vitamin D, 25-hydroxy   Vitamin B12   Ambulatory referral to Gastroenterology   PSG Sleep Study     Requested Prescriptions   Signed Prescriptions Disp Refills   traZODone (DESYREL) 50 MG tablet 15 tablet 0    Sig: Take 0.5 tablets (25 mg total) by mouth at bedtime.    Return for Follow-up 4 weeks or next available insomnia .  Camillia Herter, NP

## 2021-01-15 ENCOUNTER — Ambulatory Visit (INDEPENDENT_AMBULATORY_CARE_PROVIDER_SITE_OTHER): Payer: BC Managed Care – PPO | Admitting: Family

## 2021-01-15 ENCOUNTER — Encounter: Payer: Self-pay | Admitting: Family

## 2021-01-15 ENCOUNTER — Other Ambulatory Visit: Payer: Self-pay

## 2021-01-15 VITALS — BP 122/72 | HR 87 | Temp 98.3°F | Resp 18 | Ht 72.8 in | Wt 247.4 lb

## 2021-01-15 DIAGNOSIS — Z1321 Encounter for screening for nutritional disorder: Secondary | ICD-10-CM

## 2021-01-15 DIAGNOSIS — Z0000 Encounter for general adult medical examination without abnormal findings: Secondary | ICD-10-CM | POA: Diagnosis not present

## 2021-01-15 DIAGNOSIS — F419 Anxiety disorder, unspecified: Secondary | ICD-10-CM

## 2021-01-15 DIAGNOSIS — G4700 Insomnia, unspecified: Secondary | ICD-10-CM | POA: Diagnosis not present

## 2021-01-15 DIAGNOSIS — Z114 Encounter for screening for human immunodeficiency virus [HIV]: Secondary | ICD-10-CM

## 2021-01-15 DIAGNOSIS — Z130 Encounter for screening for diseases of the blood and blood-forming organs and certain disorders involving the immune mechanism: Secondary | ICD-10-CM

## 2021-01-15 DIAGNOSIS — Z1159 Encounter for screening for other viral diseases: Secondary | ICD-10-CM

## 2021-01-15 DIAGNOSIS — Z23 Encounter for immunization: Secondary | ICD-10-CM | POA: Diagnosis not present

## 2021-01-15 DIAGNOSIS — F32A Depression, unspecified: Secondary | ICD-10-CM

## 2021-01-15 DIAGNOSIS — Z131 Encounter for screening for diabetes mellitus: Secondary | ICD-10-CM

## 2021-01-15 DIAGNOSIS — Z1211 Encounter for screening for malignant neoplasm of colon: Secondary | ICD-10-CM

## 2021-01-15 DIAGNOSIS — R0683 Snoring: Secondary | ICD-10-CM

## 2021-01-15 DIAGNOSIS — Z13228 Encounter for screening for other metabolic disorders: Secondary | ICD-10-CM

## 2021-01-15 DIAGNOSIS — Z1329 Encounter for screening for other suspected endocrine disorder: Secondary | ICD-10-CM

## 2021-01-15 DIAGNOSIS — Z13220 Encounter for screening for lipoid disorders: Secondary | ICD-10-CM | POA: Diagnosis not present

## 2021-01-15 DIAGNOSIS — M5126 Other intervertebral disc displacement, lumbar region: Secondary | ICD-10-CM | POA: Insufficient documentation

## 2021-01-15 DIAGNOSIS — M5430 Sciatica, unspecified side: Secondary | ICD-10-CM | POA: Insufficient documentation

## 2021-01-15 MED ORDER — TRAZODONE HCL 50 MG PO TABS: 25.0000 mg | ORAL_TABLET | Freq: Every day | ORAL | 0 refills | Status: DC

## 2021-01-15 NOTE — Progress Notes (Signed)
Pt presents for annual physical exam,pt states has been experiencing insomnia and fatigue ever since he rcvd his COVID vaccine, was unsure if it coincides with his depression

## 2021-01-15 NOTE — Patient Instructions (Signed)
Preventive Care 40-58 Years Old, Male Preventive care refers to lifestyle choices and visits with your health care provider that can promote health and wellness. This includes: A yearly physical exam. This is also called an annual wellness visit. Regular dental and eye exams. Immunizations. Screening for certain conditions. Healthy lifestyle choices, such as: Eating a healthy diet. Getting regular exercise. Not using drugs or products that contain nicotine and tobacco. Limiting alcohol use. What can I expect for my preventive care visit? Physical exam Your health care provider will check your: Height and weight. These may be used to calculate your BMI (body mass index). BMI is a measurement that tells if you are at a healthy weight. Heart rate and blood pressure. Body temperature. Skin for abnormal spots. Counseling Your health care provider may ask you questions about your: Past medical problems. Family's medical history. Alcohol, tobacco, and drug use. Emotional well-being. Home life and relationship well-being. Sexual activity. Diet, exercise, and sleep habits. Work and work environment. Access to firearms. What immunizations do I need?  Vaccines are usually given at various ages, according to a schedule. Your health care provider will recommend vaccines for you based on your age, medicalhistory, and lifestyle or other factors, such as travel or where you work. What tests do I need? Blood tests Lipid and cholesterol levels. These may be checked every 5 years, or more often if you are over 50 years old. Hepatitis C test. Hepatitis B test. Screening Lung cancer screening. You may have this screening every year starting at age 55 if you have a 30-pack-year history of smoking and currently smoke or have quit within the past 15 years. Prostate cancer screening. Recommendations will vary depending on your family history and other risks. Genital exam to check for testicular cancer  or hernias. Colorectal cancer screening. All adults should have this screening starting at age 50 and continuing until age 75. Your health care provider may recommend screening at age 45 if you are at increased risk. You will have tests every 1-10 years, depending on your results and the type of screening test. Diabetes screening. This is done by checking your blood sugar (glucose) after you have not eaten for a while (fasting). You may have this done every 1-3 years. STD (sexually transmitted disease) testing, if you are at risk. Follow these instructions at home: Eating and drinking  Eat a diet that includes fresh fruits and vegetables, whole grains, lean protein, and low-fat dairy products. Take vitamin and mineral supplements as recommended by your health care provider. Do not drink alcohol if your health care provider tells you not to drink. If you drink alcohol: Limit how much you have to 0-2 drinks a day. Be aware of how much alcohol is in your drink. In the [place] one drink equals one 12 oz bottle of beer (355 mL), one 5 oz glass of wine (148 mL), or one 1 oz glass of hard liquor (44 mL).  Lifestyle Take daily care of your teeth and gums. Brush your teeth every morning and night with fluoride toothpaste. Floss one time each day. Stay active. Exercise for at least 30 minutes 5 or more days each week. Do not use any products that contain nicotine or tobacco, such as cigarettes, e-cigarettes, and chewing tobacco. If you need help quitting, ask your health care provider. Do not use drugs. If you are sexually active, practice safe sex. Use a condom or other form of protection to prevent STIs (sexually transmitted infections). If told by   your health care provider, take low-dose aspirin daily starting at age 50. Find healthy ways to cope with stress, such as: Meditation, yoga, or listening to music. Journaling. Talking to a trusted person. Spending time with friends and  family. Safety Always wear your seat belt while driving or riding in a vehicle. Do not drive: If you have been drinking alcohol. Do not ride with someone who has been drinking. When you are tired or distracted. While texting. Wear a helmet and other protective equipment during sports activities. If you have firearms in your house, make sure you follow all gun safety procedures. What's next? Go to your health care provider once a year for an annual wellness visit. Ask your health care provider how often you should have your eyes and teeth checked. Stay up to date on all vaccines. This information is not intended to replace advice given to you by your health care provider. Make sure you discuss any questions you have with your healthcare provider. Document Revised: 03/02/2019 Document Reviewed: 05/28/2018 Elsevier Patient Education  2022 Elsevier Inc.  

## 2021-01-17 ENCOUNTER — Other Ambulatory Visit: Payer: Self-pay | Admitting: Family

## 2021-01-17 DIAGNOSIS — E559 Vitamin D deficiency, unspecified: Secondary | ICD-10-CM | POA: Insufficient documentation

## 2021-01-17 LAB — CMP14+EGFR
ALT: 21 IU/L (ref 0–44)
AST: 21 IU/L (ref 0–40)
Albumin/Globulin Ratio: 1.8 (ref 1.2–2.2)
Albumin: 4.6 g/dL (ref 3.8–4.9)
Alkaline Phosphatase: 91 IU/L (ref 44–121)
BUN/Creatinine Ratio: 15 (ref 9–20)
BUN: 11 mg/dL (ref 6–24)
Bilirubin Total: 0.6 mg/dL (ref 0.0–1.2)
CO2: 20 mmol/L (ref 20–29)
Calcium: 10 mg/dL (ref 8.7–10.2)
Chloride: 103 mmol/L (ref 96–106)
Creatinine, Ser: 0.72 mg/dL — ABNORMAL LOW (ref 0.76–1.27)
Globulin, Total: 2.5 g/dL (ref 1.5–4.5)
Glucose: 96 mg/dL (ref 65–99)
Potassium: 4.4 mmol/L (ref 3.5–5.2)
Sodium: 139 mmol/L (ref 134–144)
Total Protein: 7.1 g/dL (ref 6.0–8.5)
eGFR: 106 mL/min/{1.73_m2} (ref >59)

## 2021-01-17 LAB — HEPATITIS C ANTIBODY: Hep C Virus Ab: 0.2 s/co ratio (ref 0.0–0.9)

## 2021-01-17 LAB — CBC
Hematocrit: 45.9 % (ref 37.5–51.0)
Hemoglobin: 15.5 g/dL (ref 13.0–17.7)
MCH: 29.4 pg (ref 26.6–33.0)
MCHC: 33.8 g/dL (ref 31.5–35.7)
MCV: 87 fL (ref 79–97)
Platelets: 280 10*3/uL (ref 150–450)
RBC: 5.28 x10E6/uL (ref 4.14–5.80)
RDW: 12.6 % (ref 11.6–15.4)
WBC: 6 10*3/uL (ref 3.4–10.8)

## 2021-01-17 LAB — LIPID PANEL
Chol/HDL Ratio: 3.2 ratio (ref 0.0–5.0)
Cholesterol, Total: 165 mg/dL (ref 100–199)
HDL: 51 mg/dL (ref >39)
LDL Chol Calc (NIH): 91 mg/dL (ref 0–99)
Triglycerides: 128 mg/dL (ref 0–149)
VLDL Cholesterol Cal: 23 mg/dL (ref 5–40)

## 2021-01-17 LAB — TSH: TSH: 0.896 u[IU]/mL (ref 0.450–4.500)

## 2021-01-17 LAB — HEMOGLOBIN A1C
Est. average glucose Bld gHb Est-mCnc: 114 mg/dL
Hgb A1c MFr Bld: 5.6 % (ref 4.8–5.6)

## 2021-01-17 LAB — HIV ANTIBODY (ROUTINE TESTING W REFLEX): HIV Screen 4th Generation wRfx: NONREACTIVE

## 2021-01-17 LAB — VITAMIN D 25 HYDROXY (VIT D DEFICIENCY, FRACTURES): Vit D, 25-Hydroxy: 20.9 ng/mL — ABNORMAL LOW (ref 30.0–100.0)

## 2021-01-17 LAB — VITAMIN B12: Vitamin B-12: 625 pg/mL (ref 232–1245)

## 2021-01-17 MED ORDER — VITAMIN D3 25 MCG (1000 UT) PO CAPS: 1000.0000 [IU] | ORAL_CAPSULE | Freq: Every day | ORAL | 0 refills | Status: AC

## 2021-01-17 NOTE — Progress Notes (Signed)
Kidney function normal.   Liver function normal.   Thyroid function normal.   Cholesterol normal.   Vitamin B12 normal.   No diabetes.   No anemia.  Hepatitis C negative.   HIV negative.   Vitamin D low. Daily Cholecalciferol (Vitamin D3) prescribed. Encouraged to recheck at lab only visit in 12 weeks.

## 2021-01-24 ENCOUNTER — Other Ambulatory Visit: Payer: Self-pay | Admitting: Orthopedic Surgery

## 2021-01-25 ENCOUNTER — Encounter
Admission: RE | Admit: 2021-01-25 | Discharge: 2021-01-25 | Disposition: A | Discharge status: Home / Self Care | Payer: BC Managed Care – PPO | Source: Ambulatory Visit | Attending: Orthopedic Surgery | Admitting: Orthopedic Surgery

## 2021-01-25 ENCOUNTER — Other Ambulatory Visit: Payer: Self-pay

## 2021-01-25 DIAGNOSIS — M75121 Complete rotator cuff tear or rupture of right shoulder, not specified as traumatic: Secondary | ICD-10-CM | POA: Diagnosis not present

## 2021-01-25 NOTE — Patient Instructions (Addendum)
Your procedure is scheduled [id number]. 8/18 Report to registration desk in the medical mall then go to Day Surgery. To find out your arrival time please call [phone] between [facility] on Wed. 8/17  Remember: Instructions that are not followed completely may result in serious medical risk,  up to and including death, or upon the discretion of your surgeon and anesthesiologist your  surgery may need to be rescheduled.     _X__ 1. Do not eat food after midnight the night before your procedure.                 No chewing gum or hard candies. You may drink clear liquids up to 2 hours                 before you are scheduled to arrive for your surgery- DO not drink clear                 liquids within 2 hours of the start of your surgery.                 Clear Liquids include:  water, apple juice without pulp, clear Gatorade, G2 or                  Gatorade Zero (avoid Red/Purple/Blue), Black Coffee or Tea (Do not add                 anything to coffee or tea). _____2.   Complete the "Ensure Clear Pre-surgery Clear Carbohydrate Drink"       provided to you, 2 hours before arrival. **If you are diabetic you will be       provided with an alternative drink, Gatorade Zero or G2.  __X__2.  On the morning of surgery brush your teeth with toothpaste and water, you                may rinse your mouth with mouthwash if you wish.  Do not swallow any toothpaste of mouthwash.     _X__ 3.  No Alcohol for 24 hours before or after surgery.   _X__ 4.  Do Not Smoke or use e-cigarettes For 24 Hours Prior to Your Surgery.                 Do not use any chewable tobacco products for at least 6 hours prior to                 Surgery.  ___  5.  Do not use any recreational drugs (marijuana, cocaine, heroin, ecstasy, MDMA or other) For at least one week prior to your surgery.    Combination of these drugs with anesthesia may have life threatening  results.  ____  6.  Bring all medications  with you on the day of surgery if instructed.   __x__  7.  Notify your doctor if there is any change in your medical condition      (cold, fever, infections).     Do not wear jewelry,  Do not wear lotions,  Do not shave 48 hours prior to surgery. Men may shave face and neck. Do not bring valuables to the hospital.    [facility] is not responsible for any belongings or valuables.  Contacts, dentures or bridgework may not be worn into surgery. Leave your suitcase in the car. After surgery it may be brought to your room. For patients admitted to the hospital, discharge time is determined by  your treatment team.   Patients discharged the day of surgery will not be allowed to drive home.   Make arrangements for someone to be with you for the first 24 hours of your Same Day Discharge.    Please read over the following fact sheets that you were given:     DO NOT USE NICORETTE GUM MORNING OF SURGERY  __x__ Take these medicines the morning of surgery with A SIP OF WATER:    1. fluticasone (FLONASE) 50 MCG/ACT nasal spray IF NEEDED  2.   3.   4.  5.  6.  ____ Fleet Enema (as directed)   _x___ Use CHG Soap (or wipes) as directed  May shower with usual products the night before and the morning of surgery if no CHG   ____ Use Benzoyl Peroxide Gel as instructed  ____ Use inhalers on the day of surgery  ____ Stop metformin 2 days prior to surgery    ____ Take 1/2 of usual insulin dose the night before surgery. No insulin the morning          of surgery.   ____ Stop Coumadin/Plavix/aspirin on   __x__ Stop Anti-inflammatories until after surgery.    ____ Stop supplements until after surgery.  May continue Vit D  ____ Bring C-Pap to the hospital.    If you have any questions regarding your pre-procedure instructions,  Please call Pre-admit Testing at [phone]

## 2021-01-31 MED ORDER — CEFAZOLIN SODIUM-DEXTROSE 2-4 GM/100ML-% IV SOLN
2.0000 g | INTRAVENOUS | Status: AC
  Administered 2021-02-01: 2 g via INTRAVENOUS

## 2021-02-01 ENCOUNTER — Encounter: Payer: Self-pay | Admitting: Orthopedic Surgery

## 2021-02-01 ENCOUNTER — Ambulatory Visit
Admission: RE | Admit: 2021-02-01 | Discharge: 2021-02-01 | Disposition: A | Discharge status: Home / Self Care | Payer: BC Managed Care – PPO | Attending: Orthopedic Surgery | Admitting: Orthopedic Surgery

## 2021-02-01 ENCOUNTER — Other Ambulatory Visit: Payer: Self-pay

## 2021-02-01 ENCOUNTER — Ambulatory Visit: Payer: BC Managed Care – PPO | Admitting: Anesthesiology

## 2021-02-01 ENCOUNTER — Encounter
Admission: RE | Disposition: A | Discharge status: Home / Self Care | Payer: Self-pay | Source: Home / Self Care | Attending: Orthopedic Surgery

## 2021-02-01 ENCOUNTER — Ambulatory Visit: Payer: BC Managed Care – PPO

## 2021-02-01 DIAGNOSIS — Z87891 Personal history of nicotine dependence: Secondary | ICD-10-CM | POA: Insufficient documentation

## 2021-02-01 DIAGNOSIS — M75101 Unspecified rotator cuff tear or rupture of right shoulder, not specified as traumatic: Secondary | ICD-10-CM | POA: Diagnosis not present

## 2021-02-01 DIAGNOSIS — Z419 Encounter for procedure for purposes other than remedying health state, unspecified: Secondary | ICD-10-CM

## 2021-02-01 DIAGNOSIS — M19011 Primary osteoarthritis, right shoulder: Secondary | ICD-10-CM | POA: Diagnosis not present

## 2021-02-01 DIAGNOSIS — G8918 Other acute postprocedural pain: Secondary | ICD-10-CM | POA: Diagnosis not present

## 2021-02-01 DIAGNOSIS — M75111 Incomplete rotator cuff tear or rupture of right shoulder, not specified as traumatic: Secondary | ICD-10-CM | POA: Diagnosis not present

## 2021-02-01 DIAGNOSIS — M7541 Impingement syndrome of right shoulder: Secondary | ICD-10-CM | POA: Diagnosis not present

## 2021-02-01 DIAGNOSIS — M75121 Complete rotator cuff tear or rupture of right shoulder, not specified as traumatic: Secondary | ICD-10-CM | POA: Diagnosis not present

## 2021-02-01 DIAGNOSIS — M25511 Pain in right shoulder: Secondary | ICD-10-CM | POA: Diagnosis not present

## 2021-02-01 HISTORY — PX: SHOULDER ARTHROSCOPY WITH OPEN ROTATOR CUFF REPAIR AND DISTAL CLAVICLE ACROMINECTOMY: SHX5683

## 2021-02-01 SURGERY — SHOULDER ARTHROSCOPY WITH OPEN ROTATOR CUFF REPAIR AND DISTAL CLAVICLE ACROMINECTOMY
Anesthesia: General | Laterality: Right

## 2021-02-01 MED ORDER — OXYCODONE HCL 5 MG PO TABS: 5.0000 mg | ORAL_TABLET | ORAL | 0 refills | Status: DC | PRN

## 2021-02-01 MED ORDER — OXYCODONE HCL 5 MG PO TABS
5.0000 mg | ORAL_TABLET | Freq: Once | ORAL | Status: DC | PRN
Start: 2021-02-01 — End: 2021-02-01

## 2021-02-01 MED ORDER — CHLORHEXIDINE GLUCONATE 0.12 % MT SOLN
15.0000 mL | Freq: Once | OROMUCOSAL | Status: AC
Start: 2021-02-01 — End: 2021-02-01
  Administered 2021-02-01: 15 mL via OROMUCOSAL

## 2021-02-01 MED ORDER — CHLORHEXIDINE GLUCONATE CLOTH 2 % EX PADS
6.0000 | MEDICATED_PAD | Freq: Once | CUTANEOUS | Status: AC
  Administered 2021-02-01: 6 via TOPICAL

## 2021-02-01 MED ORDER — ROCURONIUM BROMIDE 100 MG/10ML IV SOLN
INTRAVENOUS | Status: DC | PRN
  Administered 2021-02-01: 50 mg via INTRAVENOUS

## 2021-02-01 MED ORDER — FENTANYL CITRATE (PF) 100 MCG/2ML IJ SOLN
INTRAMUSCULAR | Status: DC | PRN
  Administered 2021-02-01 (×2): 50 ug via INTRAVENOUS

## 2021-02-01 MED ORDER — SUGAMMADEX SODIUM 200 MG/2ML IV SOLN
INTRAVENOUS | Status: DC | PRN
  Administered 2021-02-01: 200 mg via INTRAVENOUS

## 2021-02-01 MED ORDER — FAMOTIDINE 20 MG PO TABS
ORAL_TABLET | ORAL | Status: AC
  Administered 2021-02-01: 20 mg via ORAL
  Filled 2021-02-01: qty 1

## 2021-02-01 MED ORDER — ORAL CARE MOUTH RINSE: 15.0000 mL | Freq: Once | OROMUCOSAL | Status: AC

## 2021-02-01 MED ORDER — DEXAMETHASONE SODIUM PHOSPHATE 10 MG/ML IJ SOLN
INTRAMUSCULAR | Status: DC | PRN
  Administered 2021-02-01: 10 mg via INTRAVENOUS

## 2021-02-01 MED ORDER — PROMETHAZINE HCL 25 MG/ML IJ SOLN
INTRAMUSCULAR | Status: AC
  Filled 2021-02-01: qty 1

## 2021-02-01 MED ORDER — EPINEPHRINE PF 1 MG/ML IJ SOLN
INTRAMUSCULAR | Status: AC
  Filled 2021-02-01: qty 1

## 2021-02-01 MED ORDER — PROMETHAZINE HCL 25 MG/ML IJ SOLN
6.2500 mg | INTRAMUSCULAR | Status: AC | PRN
  Administered 2021-02-01 (×2): 6.25 mg via INTRAVENOUS

## 2021-02-01 MED ORDER — SODIUM CHLORIDE 0.9 % IV SOLN
INTRAVENOUS | Status: DC | PRN
  Administered 2021-02-01: 10 ug/min via INTRAVENOUS

## 2021-02-01 MED ORDER — MEPERIDINE HCL 25 MG/ML IJ SOLN: 6.2500 mg | INTRAMUSCULAR | Status: DC | PRN

## 2021-02-01 MED ORDER — LACTATED RINGERS IV SOLN: INTRAVENOUS | Status: DC

## 2021-02-01 MED ORDER — EPINEPHRINE PF 1 MG/ML IJ SOLN
INTRAMUSCULAR | Status: AC
  Filled 2021-02-01: qty 3

## 2021-02-01 MED ORDER — CEFAZOLIN SODIUM-DEXTROSE 2-4 GM/100ML-% IV SOLN
INTRAVENOUS | Status: AC
  Filled 2021-02-01: qty 100

## 2021-02-01 MED ORDER — LACTATED RINGERS IR SOLN
Status: DC | PRN
  Administered 2021-02-01 (×4): 3001 mL

## 2021-02-01 MED ORDER — BUPIVACAINE LIPOSOME 1.3 % IJ SUSP
INTRAMUSCULAR | Status: AC
  Filled 2021-02-01: qty 20

## 2021-02-01 MED ORDER — KETAMINE HCL 50 MG/5ML IJ SOSY
PREFILLED_SYRINGE | INTRAMUSCULAR | Status: AC
  Filled 2021-02-01: qty 5

## 2021-02-01 MED ORDER — SODIUM CHLORIDE 0.9 % IR SOLN
Status: DC | PRN
  Administered 2021-02-01: 200 mL

## 2021-02-01 MED ORDER — ACETAMINOPHEN 500 MG PO TABS: 1000.0000 mg | ORAL_TABLET | ORAL | Status: AC

## 2021-02-01 MED ORDER — PHENYLEPHRINE HCL (PRESSORS) 10 MG/ML IV SOLN
INTRAVENOUS | Status: AC
  Filled 2021-02-01: qty 1

## 2021-02-01 MED ORDER — PHENYLEPHRINE HCL (PRESSORS) 10 MG/ML IV SOLN
INTRAVENOUS | Status: DC | PRN
  Administered 2021-02-01 (×3): 100 ug via INTRAVENOUS

## 2021-02-01 MED ORDER — LIDOCAINE HCL (PF) 1 % IJ SOLN
INTRAMUSCULAR | Status: DC | PRN
  Administered 2021-02-01: 1 mL

## 2021-02-01 MED ORDER — ACETAMINOPHEN 500 MG PO TABS
ORAL_TABLET | ORAL | Status: AC
  Administered 2021-02-01: 1000 mg via ORAL
  Filled 2021-02-01: qty 2

## 2021-02-01 MED ORDER — LIDOCAINE HCL (PF) 1 % IJ SOLN
INTRAMUSCULAR | Status: AC
  Filled 2021-02-01: qty 30

## 2021-02-01 MED ORDER — BUPIVACAINE HCL (PF) 0.5 % IJ SOLN
INTRAMUSCULAR | Status: AC
  Filled 2021-02-01: qty 10

## 2021-02-01 MED ORDER — LIDOCAINE HCL (PF) 1 % IJ SOLN
INTRAMUSCULAR | Status: AC
  Filled 2021-02-01: qty 5

## 2021-02-01 MED ORDER — OXYCODONE HCL 5 MG/5ML PO SOLN: 5.0000 mg | Freq: Once | ORAL | Status: DC | PRN

## 2021-02-01 MED ORDER — BUPIVACAINE LIPOSOME 1.3 % IJ SUSP
INTRAMUSCULAR | Status: DC | PRN
  Administered 2021-02-01: 10 mL via PERINEURAL

## 2021-02-01 MED ORDER — KETAMINE HCL 10 MG/ML IJ SOLN
INTRAMUSCULAR | Status: DC | PRN
  Administered 2021-02-01: 30 mg via INTRAVENOUS

## 2021-02-01 MED ORDER — FAMOTIDINE 20 MG PO TABS: 20.0000 mg | ORAL_TABLET | Freq: Once | ORAL | Status: AC

## 2021-02-01 MED ORDER — FENTANYL CITRATE (PF) 100 MCG/2ML IJ SOLN
INTRAMUSCULAR | Status: AC
  Filled 2021-02-01: qty 2

## 2021-02-01 MED ORDER — BUPIVACAINE HCL (PF) 0.5 % IJ SOLN
INTRAMUSCULAR | Status: DC | PRN
  Administered 2021-02-01: 10 mL via PERINEURAL

## 2021-02-01 MED ORDER — GLYCOPYRROLATE 0.2 MG/ML IJ SOLN
INTRAMUSCULAR | Status: DC | PRN
  Administered 2021-02-01: .2 mg via INTRAVENOUS

## 2021-02-01 MED ORDER — ACETAMINOPHEN 10 MG/ML IV SOLN: 1000.0000 mg | Freq: Once | INTRAVENOUS | Status: DC | PRN

## 2021-02-01 MED ORDER — MIDAZOLAM HCL 2 MG/2ML IJ SOLN
INTRAMUSCULAR | Status: AC
  Filled 2021-02-01: qty 2

## 2021-02-01 MED ORDER — NEOMYCIN-POLYMYXIN B GU 40-200000 IR SOLN
Status: AC
  Filled 2021-02-01: qty 20

## 2021-02-01 MED ORDER — PROPOFOL 10 MG/ML IV BOLUS
INTRAVENOUS | Status: DC | PRN
  Administered 2021-02-01: 200 mg via INTRAVENOUS

## 2021-02-01 MED ORDER — DROPERIDOL 2.5 MG/ML IJ SOLN
0.6250 mg | Freq: Once | INTRAMUSCULAR | Status: DC | PRN
  Filled 2021-02-01: qty 2

## 2021-02-01 MED ORDER — FENTANYL CITRATE (PF) 100 MCG/2ML IJ SOLN: 25.0000 ug | INTRAMUSCULAR | Status: DC | PRN

## 2021-02-01 MED ORDER — ONDANSETRON HCL 4 MG/2ML IJ SOLN
INTRAMUSCULAR | Status: DC | PRN
  Administered 2021-02-01: 4 mg via INTRAVENOUS

## 2021-02-01 MED ORDER — EPHEDRINE SULFATE 50 MG/ML IJ SOLN
INTRAMUSCULAR | Status: DC | PRN
  Administered 2021-02-01: 10 mg via INTRAVENOUS
  Administered 2021-02-01: 5 mg via INTRAVENOUS

## 2021-02-01 MED ORDER — CHLORHEXIDINE GLUCONATE 0.12 % MT SOLN
OROMUCOSAL | Status: AC
  Administered 2021-02-01: 15 mL via OROMUCOSAL
  Filled 2021-02-01: qty 15

## 2021-02-01 MED ORDER — ONDANSETRON HCL 4 MG PO TABS: 4.0000 mg | ORAL_TABLET | Freq: Three times a day (TID) | ORAL | 0 refills | Status: DC | PRN

## 2021-02-01 MED ORDER — MIDAZOLAM HCL 2 MG/2ML IJ SOLN
INTRAMUSCULAR | Status: DC | PRN
  Administered 2021-02-01: 2 mg via INTRAVENOUS

## 2021-02-01 MED ORDER — PROPOFOL 10 MG/ML IV BOLUS
INTRAVENOUS | Status: AC
  Filled 2021-02-01: qty 20

## 2021-02-01 MED ORDER — BUPIVACAINE HCL (PF) 0.25 % IJ SOLN
INTRAMUSCULAR | Status: AC
  Filled 2021-02-01: qty 30

## 2021-02-01 MED ORDER — LIDOCAINE HCL (CARDIAC) PF 100 MG/5ML IV SOSY
PREFILLED_SYRINGE | INTRAVENOUS | Status: DC | PRN
  Administered 2021-02-01: 60 mg via INTRAVENOUS

## 2021-02-01 SURGICAL SUPPLY — 74 items
ADAPTER IRRIG TUBE 2 SPIKE SOL (ADAPTER) ×3 IMPLANT
ADPR TBG 2 SPK PMP STRL ASCP (ADAPTER) ×1
ANCH SUT 5.5 KNTLS PEEK (Orthopedic Implant) ×1 IMPLANT
ANCH SUT Q-FX 2.8 (Anchor) ×3 IMPLANT
ANCHOR ALL-SUT Q-FIX 2.8 (Anchor) ×7 IMPLANT
ANCHOR SUT 5.5 MULTIFIX (Orthopedic Implant) ×1 IMPLANT
BLADE SHAVER 4.5X7 STR FR (MISCELLANEOUS) ×1 IMPLANT
BUR BR 5.5 WIDE MOUTH (BURR) IMPLANT
CANISTER SUCT LVC 12 LTR MEDI- (MISCELLANEOUS) ×1 IMPLANT
CANNULA 5.75X7 CRYSTAL CLEAR (CANNULA) ×4 IMPLANT
CANNULA PARTIAL THREAD 2X7 (CANNULA) ×2 IMPLANT
CANNULA TWIST IN 8.25X9CM (CANNULA) ×2 IMPLANT
CONNECTOR PERFECT PASSER (CONNECTOR) ×3 IMPLANT
COOLER POLAR GLACIER W/PUMP (MISCELLANEOUS) ×2 IMPLANT
DEVICE SUCT BLK HOLE OR FLOOR (MISCELLANEOUS) ×2 IMPLANT
DRAPE 3/4 80X56 (DRAPES) ×2 IMPLANT
DRAPE IMP U-DRAPE 54X76 (DRAPES) ×4 IMPLANT
DRAPE INCISE IOBAN 66X45 STRL (DRAPES) ×2 IMPLANT
DRAPE U-SHAPE 47X51 STRL (DRAPES) ×2 IMPLANT
DURAPREP 26ML APPLICATOR (WOUND CARE) ×6 IMPLANT
ELECT REM PT RETURN 9FT ADLT (ELECTROSURGICAL) ×2
ELECTRODE REM PT RTRN 9FT ADLT (ELECTROSURGICAL) ×1 IMPLANT
GAUZE SPONGE 4X4 12PLY STRL (GAUZE/BANDAGES/DRESSINGS) ×2 IMPLANT
GAUZE XEROFORM 1X8 LF (GAUZE/BANDAGES/DRESSINGS) ×2 IMPLANT
GLOVE SURG ORTHO LTX SZ9 (GLOVE) ×6 IMPLANT
GLOVE SURG UNDER POLY LF SZ9 (GLOVE) ×2 IMPLANT
GOWN STRL REUS TWL 2XL XL LVL4 (GOWN DISPOSABLE) ×2 IMPLANT
GOWN STRL REUS W/ TWL LRG LVL3 (GOWN DISPOSABLE) ×1 IMPLANT
GOWN STRL REUS W/TWL LRG LVL3 (GOWN DISPOSABLE) ×2
IV LACTATED RINGER IRRG 3000ML (IV SOLUTION) ×16
IV LR IRRIG 3000ML ARTHROMATIC (IV SOLUTION) ×8 IMPLANT
KIT POSITIONER SHLDR ASSISTARM (MISCELLANEOUS) ×1 IMPLANT
KIT STABILIZATION SHOULDER (MISCELLANEOUS) ×1 IMPLANT
KIT SUTURE 2.8 Q-FIX DISP (MISCELLANEOUS) ×1 IMPLANT
KIT SUTURETAK 3.0 INSERT PERC (KITS) IMPLANT
KIT TURNOVER KIT A (KITS) ×2 IMPLANT
MANIFOLD NEPTUNE II (INSTRUMENTS) ×4 IMPLANT
MASK FACE SPIDER DISP (MASK) ×2 IMPLANT
MAT ABSORB  FLUID 56X50 GRAY (MISCELLANEOUS) ×4
MAT ABSORB FLUID 56X50 GRAY (MISCELLANEOUS) ×3 IMPLANT
NDL SAFETY ECLIPSE 18X1.5 (NEEDLE) ×1 IMPLANT
NEEDLE HYPO 18GX1.5 SHARP (NEEDLE) ×2
NEEDLE HYPO 22GX1.5 SAFETY (NEEDLE) ×2 IMPLANT
NS IRRIG 500ML POUR BTL (IV SOLUTION) ×2 IMPLANT
PACK ARTHROSCOPY SHOULDER (MISCELLANEOUS) ×2 IMPLANT
PAD ARMBOARD 7.5X6 YLW CONV (MISCELLANEOUS) ×4 IMPLANT
PAD WRAPON POLAR SHDR XLG (MISCELLANEOUS) ×1 IMPLANT
PASSER SUT FIRSTPASS SELF (INSTRUMENTS) ×2 IMPLANT
SHAVER BLADE BONE CUTTER  5.5 (BLADE)
SHAVER BLADE BONE CUTTER 4.5 (BLADE) ×1 IMPLANT
SHAVER BLADE BONE CUTTER 5.5 (BLADE) ×1 IMPLANT
SHAVER BLADE TAPERED BLUNT 4 (BLADE) ×1 IMPLANT
SPONGE T-LAP 18X18 ~~LOC~~+RFID (SPONGE) ×2 IMPLANT
STRIP CLOSURE SKIN 1/2X4 (GAUZE/BANDAGES/DRESSINGS) ×2 IMPLANT
SUT ETHILON 4-0 (SUTURE) ×4
SUT ETHILON 4-0 FS2 18XMFL BLK (SUTURE) ×2
SUT LASSO 90 DEG SD STR (SUTURE) IMPLANT
SUT MNCRL 4-0 (SUTURE) ×2
SUT MNCRL 4-0 27XMFL (SUTURE) ×1
SUT PDS AB 0 CT1 27 (SUTURE) ×4 IMPLANT
SUT PERFECTPASSER WHITE CART (SUTURE) ×6 IMPLANT
SUT SMART STITCH CARTRIDGE (SUTURE) ×5 IMPLANT
SUT ULTRABRAID 2 COBRAID 38 (SUTURE) IMPLANT
SUT VIC AB 0 CT1 36 (SUTURE) ×4 IMPLANT
SUT VIC AB 2-0 CT2 27 (SUTURE) ×2 IMPLANT
SUTURE ETHLN 4-0 FS2 18XMF BLK (SUTURE) ×1 IMPLANT
SUTURE MNCRL 4-0 27XMF (SUTURE) ×1 IMPLANT
SYR 10ML LL (SYRINGE) ×2 IMPLANT
TAPE MICROFOAM 4IN (TAPE) ×2 IMPLANT
TUBING CONNECTING 10 (TUBING) ×2 IMPLANT
TUBING INFLOW SET DBFLO PUMP (TUBING) ×2 IMPLANT
TUBING OUTFLOW SET DBLFO PUMP (TUBING) ×2 IMPLANT
WAND WEREWOLF FLOW 90D (MISCELLANEOUS) ×2 IMPLANT
WRAPON POLAR PAD SHDR XLG (MISCELLANEOUS) ×2

## 2021-02-01 NOTE — Transfer of Care (Signed)
Immediate Anesthesia Transfer of Care Note  Patient: Billy Roman  Procedure(s) Performed: RIGHT SHOULDER ARTHROSCOPY WITH MINI OPEN ROTATOR CUFF REPAIR AND DISTAL CLAVICLE EXCISION (Right) SHOULDER ARTHROSCOPY WITH SUBACROMIAL DECOMPRESSION AND DISTAL CLAVICLE EXCISION (Right)  Patient Location: PACU  Anesthesia Type:General  Level of Consciousness: awake  Airway & Oxygen Therapy: Patient Spontanous Breathing and Patient connected to face mask oxygen  Post-op Assessment: Report given to RN and Post -op Vital signs reviewed and stable  Post vital signs: Reviewed and stable  Last Vitals:  Vitals Value Taken Time  BP 134/78 02/01/21 1015  Temp    Pulse 91 02/01/21 1017  Resp 12 02/01/21 1017  SpO2 97 % 02/01/21 1017  Vitals shown include unvalidated device data.  Last Pain:  Vitals:   02/01/21 [id number]  PainSc: 5          Complications: No notable events documented.

## 2021-02-01 NOTE — Anesthesia Postprocedure Evaluation (Signed)
Anesthesia Post Note  Patient: Billy Roman  Procedure(s) Performed: RIGHT SHOULDER ARTHROSCOPY WITH MINI OPEN ROTATOR CUFF REPAIR AND DISTAL CLAVICLE EXCISION (Right) SHOULDER ARTHROSCOPY WITH SUBACROMIAL DECOMPRESSION AND DISTAL CLAVICLE EXCISION (Right)  Patient location during evaluation: PACU Anesthesia Type: General Level of consciousness: awake and alert Pain management: pain level controlled Vital Signs Assessment: post-procedure vital signs reviewed and stable Respiratory status: spontaneous breathing, nonlabored ventilation, respiratory function stable and patient connected to nasal cannula oxygen Cardiovascular status: blood pressure returned to baseline and stable Postop Assessment: no apparent nausea or vomiting Anesthetic complications: no   No notable events documented.   Last Vitals:  Vitals:   02/01/21 1100 02/01/21 1111  BP: 112/66 123/64  Pulse: 60 66  Resp: 13 14  Temp: 36.4 C (!) 36.2 C  SpO2: 95% 94%    Last Pain:  Vitals:   02/01/21 1111  TempSrc: Temporal  PainSc: 0-No pain                 Arita Miss

## 2021-02-01 NOTE — H&P (Signed)
PREOPERATIVE H&P  Chief Complaint: rotator cuff tear of right shoulder  HPI: Billy Roman is a 60 y.o. male who presents for preoperative history and physical with a diagnosis of rotator cuff tear of right shoulder confirmed by MRI. Symptoms of pain, limited ROM and weakness of the right shoulder are significantly impairing activities of daily living.  He failed non-operative management and wished to proceed with surgical repair of his rotator cuff tear.  History reviewed. No pertinent past medical history. Past Surgical History:  Procedure Laterality Date   RHINOPLASTY     SEPTOPLASTY     SHOULDER ARTHROSCOPY Left    SPINE SURGERY     lumbar laminectomy   WRIST SURGERY Bilateral    corpal tunnel   Social History   Socioeconomic History   Marital status: Single    Spouse name: Not on file   Number of children: Not on file   Years of education: Not on file   Highest education level: Not on file  Occupational History   Not on file  Tobacco Use   Smoking status: Former    Packs/day: 1.00    Years: 30.00    Pack years: 30.00    Types: Cigarettes    Quit date: 11/21/2020    Years since quitting: 0.1   Smokeless tobacco: Former    Types: Chew    Quit date: 1990  Vaping Use   Vaping Use: Never used  Substance and Sexual Activity   Alcohol use: No    Comment: rarely   Drug use: No   Sexual activity: Not Currently  Other Topics Concern   Not on file  Social History Narrative   Not on file   Social Determinants of Health   Financial Resource Strain: Not on file  Food Insecurity: Not on file  Transportation Needs: Not on file  Physical Activity: Not on file  Stress: Not on file  Social Connections: Not on file   History reviewed. No pertinent family history. Allergies  Allergen Reactions   Propyphenazone Anaphylaxis    Felt hot   Prior to Admission medications   Medication Sig Start Date End Date Taking? Authorizing Provider  Cholecalciferol (VITAMIN D3)  25 MCG (1000 UT) CAPS Take 1 capsule (1,000 Units total) by mouth daily. 01/17/21 05/17/21 Yes Minette Brine, Amy J, NP  fluticasone (FLONASE) 50 MCG/ACT nasal spray Place 1 spray into both nostrils daily as needed for allergies or rhinitis.   Yes [provider]  nicotine polacrilex (NICORETTE) 4 MG gum Take 4 mg by mouth as needed for smoking cessation.   Yes [provider]  traZODone (DESYREL) 50 MG tablet Take 0.5 tablets (25 mg total) by mouth at bedtime. 01/15/21 02/14/21 Yes Minette Brine, Amy J, NP     Positive ROS: All other systems have been reviewed and were otherwise negative with the exception of those mentioned in the HPI and as above.  Physical Exam: General: Alert, no acute distress Cardiovascular: Regular rate and rhythm, no murmurs rubs or gallops.  No pedal edema Respiratory: Clear to auscultation bilaterally, no wheezes rales or rhonchi. No cyanosis, no use of accessory musculature GI: No organomegaly, abdomen is soft and non-tender nondistended with positive bowel sounds. Skin: Skin intact, no lesions within the operative field. Neurologic: Sensation intact distally Psychiatric: Patient is competent for consent with normal mood and affect Lymphatic: No cervical lymphadenopathy  MUSCULOSKELETAL: The patient continues to have pain in the mid range of abduction despite forward elevation of approximately 150 degrees and  abduction to approximately 140 degrees. He does have pain with downward directed force on his abducted shoulder. He demonstrated no weakness to internal or external rotation of the right shoulder. He has full digital, wrist and elbow range of motion, intact sensation to light touch and a palpable radial pulse.    Radiology: I have reviewed the patient's MR arthrogram images as well as the radiology report. IThe patient has a frayed, near full thickness 1.1 cm articular-sided and interstitial supraspinatus tear. He had mild bursitis. He had moderate AC joint  arthropathy with marrow reaction at both the distal clavicle and acromion. The patient has a frayed and worn posterior superior labrum without detachment.   Assessment: rotator cuff tear of right shoulder  Plan: Plan for Procedure(s): RIGHT SHOULDER ARTHROSCOPY WITH MINI OPEN ROTATOR CUFF REPAIR AND DISTAL CLAVICLE EXCISION SHOULDER ARTHROSCOPY WITH SUBACROMIAL DECOMPRESSION AND DISTAL CLAVICLE EXCISION  I have reviewed the details of the operation and the post-op course with the patient.   He is familiar with the post-op recovery as he has undergone successful left shoulder rotator cuff repair with me previously.   A pre-op history and physical was performed at the bedside this AM.   I marked the right shoulder according to the hospital's correct site of surgery protocol.    I discussed the risks and benefits of surgery. The risks include but are not limited to infection, bleeding, nerve or blood vessel injury, joint stiffness or loss of motion, persistent pain, weakness or instability, retear of the rotator cuff, failure of the repair, hardware failure and the need for further surgery. Medical risks include but are not limited to DVT and pulmonary embolism, myocardial infarction, stroke, pneumonia, respiratory failure and death. Patient understood these risks and wished to proceed.     Thornton Park, MD   02/01/2021 7:47 AM

## 2021-02-01 NOTE — Op Note (Addendum)
02/01/2021  10:25 AM  PATIENT:  Billy Roman  60 y.o. male  PRE-OPERATIVE DIAGNOSIS:  rotator cuff tear of right shoulder  POST-OPERATIVE DIAGNOSIS:  rotator cuff tear of right shoulder, subacromial impingement and acromioclavicular joint arthrosis  PROCEDURE:  Procedure(s): Right shoulder arthroscopic subacromial decompression, distal clavicle excision and mini open rotator cuff repair   SURGEON:  Surgeon(s) and Role:    Thornton Park, MD - Primary  ANESTHESIA:   general and paracervical block   PREOPERATIVE INDICATIONS:  Billy Roman is a  60 y.o. male with a diagnosis of rotator cuff tear of right shoulder failed conservative treatment and elected for surgical management.    The risks benefits and alternatives were discussed with the patient preoperatively including but not limited to the risks of infection, bleeding, nerve injury, persistent pain or weakness, shoulder stiffness/arthrofibrosis, failure of the repair, re-tear of the rotator cuff and the need for further surgery. Medical risks include DVT and pulmonary embolism, myocardial infarction, stroke, pneumonia, respiratory failure and death. Patient understood these risks and wished to proceed.  OPERATIVE IMPLANTS: [employer] Multifix anchor x 1 & [employer] Q Fix anchors x 2  OPERATIVE FINDINGS: Full-thickness rotator cuff tear involving the supraspinatus, subacromial impingement and acromioclavicular joint arthrosis.  Biceps tendon is intact.  There was no labral tear.  OPERATIVE PROCEDURE: The patient was met in the preoperative area. The right shoulder was signed with the word yes and my initials according the hospital's correct site of surgery protocol.   A pre-op history and physical was performed at the bedside.  The patient underwent an interscalene block with Exparel by the anesthesia service in the preoperative area.  Patient was brought to the operating room where he underwent general  anesthesia.  The patient was placed in a beachchair position.  A spider arm positioner was used for this case.  Examination under anesthesia revealed forward flexion to proximal 140 degrees abduction deficit 130 degrees of external rotation and abduction to approximately 80 to 85 degrees and internal rotation of approximately 50 to 60 degrees.  Patient demonstrated no loss of passive range of motion or instability with load shift testing. The patient had a negative sulcus sign.  Patient was prepped and draped in a sterile fashion. A timeout was performed to verify the patient's name, date of birth, medical record number, correct site of surgery and correct procedure to be performed there was also used to verify the patient received antibiotics that all appropriate instruments, implants and radiographs studies were available in the room. Once all in attendance were in agreement case began.  Patient received ancef 2 grams IV for pre-op antibiotics.  Bony landmarks were drawn out with a surgical marker along with proposed arthroscopy incisions.  An 11 blade was used to establish a posterior portal through which the arthroscope was placed in the glenohumeral joint. A full diagnostic examination of the shoulder was performed.  Findings on diagnostic arthroscopy are listed above.  The arthroscope was then placed in the subacromial space.  Extensive bursitis was encountered and debrided using a 4-0 resector shaver blade and a 90 ArthroCare wand from a lateral portal which was established under direct visualization using an 18-gauge spinal needle. A subacromial decompression was performed sing a 4.5 mm bone cutter shaver blade from the lateral portal.   A distal clavicle excision was then performed through the anterior portal also using the 4.5 mm bone cutter shaver blade.  The 4.5 mm shaver blade was  then used to debride the greater tuberosity of all torn fibers of the rotator cuff.   Two Perfect Pass sutures were  placed in the lateral border of the rotator cuff tear. All arthroscopic instruments were then removed and the mini-open portion of the procedure began.  A saber-type incision was made along the lateral border of the acromion. The deltoid muscle was identified and split in line with its fibers which allowed visualization of the rotator cuff. The Perfect Pass sutures previously placed in the lateral border of the rotator cuff were brought out through the deltoid split.  Two Smith and [employer] Fix anchors were placed at the articular margin of the humeral head with the greater tuberosity. The suture limbs of the Q Fix anchors were passed medially through the rotator cuff using a First Pass suture passer.   A third perfect pass suture was then placed in the lateral border of the rotator cuff.  The three Perfect Pass sutures were then anchored to the greater tuberosity footprint using a single [employer] Multifix anchor. The sutures were passed through the Multifix anchors and then tensioned to allow reduction of the rotator cuff to the greater tuberosity footprint. The medial row repair was then performed using the Q fix sutures, which were tied down using an arthroscopic knot tying technique.  Arthroscopic images of the repair were taken with the arthroscope both externally and from inside the glenohumeral joint.  All incisions were copiously irrigated. The deltoid fascia was repaired using a 0 Vicryl suture.  The subcutaneous tissue of all incisions were closed with a 2-0 Vicryl. Skin closure for the arthroscopic incisions was performed with 4-0 nylon. The skin edges of the saber incision was approximated with a running 4-0 undyed Monocryl.  A dry sterile dressing was applied.  The patient was placed in an abduction sling and a Polar Care was applied to the shoulder.  All sharp, sponge and it instrument counts were correct at the conclusion of the case. I was scrubbed and present for the entire case. I left  a message for the patient's daughter by phone postoperatively to let her know the case had been performed without complication and the patient was stable in recovery room.

## 2021-02-01 NOTE — Anesthesia Procedure Notes (Addendum)
Anesthesia Regional Block: Interscalene brachial plexus block   Pre-Anesthetic Checklist: , timeout performed,  Correct Patient, Correct Site, Correct Laterality,  Correct Procedure, Correct Position, site marked,  Risks and benefits discussed,  Surgical consent,  Pre-op evaluation,  At surgeon's request and post-op pain management  Laterality: Upper and Right  Prep: chloraprep       Needles:  Injection technique: Single-shot  Needle Type: Stimiplex     Needle Length: 4cm  Needle Gauge: 20     Additional Needles:   Procedures:,,,, ultrasound used (permanent image in chart),,    Narrative:  Start time: 02/01/2021 7:08 AM End time: 02/01/2021 7:10 AM Injection made incrementally with aspirations every 20 mL.  Performed by: Personally  Anesthesiologist: Iran Ouch, MD  Additional Notes: Patient consented for risk and benefits of nerve block including but not limited to nerve damage, failed block, bleeding and infection.  Patient voiced understanding.

## 2021-02-01 NOTE — Anesthesia Procedure Notes (Signed)
Procedure Name: Intubation Date/Time: 02/01/2021 7:57 AM Performed by: Biagio Borg, CRNA Pre-anesthesia Checklist: Patient identified, Emergency Drugs available, Suction available and Patient being monitored Patient Re-evaluated:Patient Re-evaluated prior to induction Oxygen Delivery Method: Circle system utilized Preoxygenation: Pre-oxygenation with 100% oxygen Induction Type: IV induction Ventilation: Mask ventilation without difficulty Laryngoscope Size: McGraph and 4 Grade View: Grade II Tube type: Oral Tube size: 7.0 mm Number of attempts: 1 Airway Equipment and Method: Stylet Placement Confirmation: ETT inserted through vocal cords under direct vision, positive ETCO2 and breath sounds checked- equal and bilateral Secured at: 22 cm Tube secured with: Tape Dental Injury: Teeth and Oropharynx as per pre-operative assessment

## 2021-02-01 NOTE — Anesthesia Preprocedure Evaluation (Addendum)
Anesthesia Evaluation  Patient identified by MRN, date of birth, ID band Patient awake    Reviewed: Allergy & Precautions, NPO status , Patient's Chart, lab work & pertinent test results  Airway Mallampati: III  TM Distance: >3 FB Neck ROM: Full    Dental  (+) Chipped,    Pulmonary neg pulmonary ROS, former smoker,    Pulmonary exam normal        Cardiovascular negative cardio ROS Normal cardiovascular exam     Neuro/Psych PSYCHIATRIC DISORDERS Anxiety Depression negative neurological ROS     GI/Hepatic negative GI ROS, Neg liver ROS,   Endo/Other  negative endocrine ROS  Renal/GU negative Renal ROS     Musculoskeletal Rotator cuff tear of right shoulder   Abdominal (+) + obese,   Peds negative pediatric ROS (+)  Hematology negative hematology ROS (+)   Anesthesia Other Findings   Reproductive/Obstetrics negative OB ROS                            Anesthesia Physical Anesthesia Plan  ASA: 2  Anesthesia Plan: General   Post-op Pain Management:  Regional for Post-op pain   Induction: Intravenous  PONV Risk Score and Plan: 2 and Ondansetron and Dexamethasone  Airway Management Planned: Oral ETT  Additional Equipment:   Intra-op Plan:   Post-operative Plan: Extubation in OR  Informed Consent: I have reviewed the patients History and Physical, chart, labs and discussed the procedure including the risks, benefits and alternatives for the proposed anesthesia with the patient or authorized representative who has indicated his/her understanding and acceptance.     Dental advisory given  Plan Discussed with: CRNA and Anesthesiologist  Anesthesia Plan Comments:         Anesthesia Quick Evaluation

## 2021-02-01 NOTE — Discharge Instructions (Signed)
AMBULATORY SURGERY  ?DISCHARGE INSTRUCTIONS ? ? ?The drugs that you were given will stay in your system until tomorrow so for the next 24 hours you should not: ? ?Drive an automobile ?Make any legal decisions ?Drink any alcoholic beverage ? ? ?You may resume regular meals tomorrow.  Today it is better to start with liquids and gradually work up to solid foods. ? ?You may eat anything you prefer, but it is better to start with liquids, then soup and crackers, and gradually work up to solid foods. ? ? ?Please notify your doctor immediately if you have any unusual bleeding, trouble breathing, redness and pain at the surgery site, drainage, fever, or pain not relieved by medication. ? ? ? ?Additional Instructions: ? ? ? ?Please contact your physician with any problems or Same Day Surgery at [phone], Monday through Friday 6 am to 4 pm, or [place] [facility] [facility] number at [phone].  ?

## 2021-02-02 ENCOUNTER — Encounter: Payer: Self-pay | Admitting: Orthopedic Surgery

## 2021-02-09 DIAGNOSIS — M25511 Pain in right shoulder: Secondary | ICD-10-CM | POA: Insufficient documentation

## 2021-02-09 DIAGNOSIS — M25611 Stiffness of right shoulder, not elsewhere classified: Secondary | ICD-10-CM | POA: Insufficient documentation

## 2021-02-11 ENCOUNTER — Other Ambulatory Visit: Payer: Self-pay | Admitting: Family

## 2021-02-11 DIAGNOSIS — G4700 Insomnia, unspecified: Secondary | ICD-10-CM

## 2021-02-12 DIAGNOSIS — M25611 Stiffness of right shoulder, not elsewhere classified: Secondary | ICD-10-CM | POA: Diagnosis not present

## 2021-02-12 DIAGNOSIS — M25511 Pain in right shoulder: Secondary | ICD-10-CM | POA: Diagnosis not present

## 2021-02-18 NOTE — Progress Notes (Deleted)
Patient ID: Billy Roman, male    DOB: 1961-05-02  MRN: [id number]  CC: Insomnia Follow-Up   Subjective: Billy Roman is a 60 y.o. male who presents for insomnia follow-up.   His concerns today include:   INSOMNIA FOLLOW-UP: 01/15/2021: - Begin Trazodone as prescribed.  - Sleep study for further evaluation and management.   02/21/2021:   Patient Active Problem List   Diagnosis Date Noted   Vitamin D deficiency 01/17/2021   HNP (herniated nucleus pulposus), lumbar 01/15/2021   Sciatica 01/15/2021   Anxiety and depression 08/23/2009     Current Outpatient Medications on File Prior to Visit  Medication Sig Dispense Refill   Cholecalciferol (VITAMIN D3) 25 MCG (1000 UT) CAPS Take 1 capsule (1,000 Units total) by mouth daily. 120 capsule 0   fluticasone (FLONASE) 50 MCG/ACT nasal spray Place 1 spray into both nostrils daily as needed for allergies or rhinitis.     nicotine polacrilex (NICORETTE) 4 MG gum Take 4 mg by mouth as needed for smoking cessation.     ondansetron (ZOFRAN) 4 MG tablet Take 1 tablet (4 mg total) by mouth every 8 (eight) hours as needed for nausea or vomiting. 30 tablet 0   oxyCODONE (OXY IR/ROXICODONE) 5 MG immediate release tablet Take 1 tablet (5 mg total) by mouth every 4 (four) hours as needed. 40 tablet 0   traZODone (DESYREL) 50 MG tablet Take 0.5 tablets (25 mg total) by mouth at bedtime. 15 tablet 0   No current facility-administered medications on file prior to visit.    Allergies  Allergen Reactions   Propyphenazone Anaphylaxis    Felt hot    Social History   Socioeconomic History   Marital status: Single    Spouse name: Not on file   Number of children: Not on file   Years of education: Not on file   Highest education level: Not on file  Occupational History   Not on file  Tobacco Use   Smoking status: Former    Packs/day: 1.00    Years: 30.00    Pack years: 30.00    Types: Cigarettes    Quit date: 11/21/2020    Years  since quitting: 0.2   Smokeless tobacco: Former    Types: Chew    Quit date: 1990  Vaping Use   Vaping Use: Never used  Substance and Sexual Activity   Alcohol use: No    Comment: rarely   Drug use: No   Sexual activity: Not Currently  Other Topics Concern   Not on file  Social History Narrative   Not on file   Social Determinants of Health   Financial Resource Strain: Not on file  Food Insecurity: Not on file  Transportation Needs: Not on file  Physical Activity: Not on file  Stress: Not on file  Social Connections: Not on file  Intimate Partner Violence: Not on file    No family history on file.  Past Surgical History:  Procedure Laterality Date   RHINOPLASTY     SEPTOPLASTY     SHOULDER ARTHROSCOPY Left    SHOULDER ARTHROSCOPY WITH OPEN ROTATOR CUFF REPAIR AND DISTAL CLAVICLE ACROMINECTOMY Right 02/01/2021   Procedure: RIGHT SHOULDER ARTHROSCOPY WITH MINI OPEN ROTATOR CUFF REPAIR AND DISTAL CLAVICLE EXCISION;  Surgeon: Thornton Park, MD;  Location: [facility] ORS;  Service: Orthopedics;  Laterality: Right;   SPINE SURGERY     lumbar laminectomy   WRIST SURGERY Bilateral    corpal tunnel    ROS:  Review of Systems Negative except as stated above  PHYSICAL EXAM: There were no vitals taken for this visit.  Physical Exam  {male adult master:310786} {male adult master:310785}  CMP Latest Ref Rng & Units 01/15/2021 07/26/2019 08/23/2009  Glucose 65 - 99 mg/dL 96 92 92  BUN 6 - 24 mg/dL '11 17 11  '$ Creatinine 0.76 - 1.27 mg/dL 0.72(L) 0.76 0.8  Sodium 134 - 144 mmol/L 139 140 144  Potassium 3.5 - 5.2 mmol/L 4.4 4.3 4.5  Chloride 96 - 106 mmol/L 103 101 110  CO2 20 - 29 mmol/L '20 21 30  '$ Calcium 8.7 - 10.2 mg/dL 10.0 9.7 9.2  Total Protein 6.0 - 8.5 g/dL 7.1 7.7 7.1  Total Bilirubin 0.0 - 1.2 mg/dL 0.6 0.4 0.7  Alkaline Phos 44 - 121 IU/L 91 58 51  AST 0 - 40 IU/L '21 23 23  '$ ALT 0 - 44 IU/L '21 22 22   '$ Lipid Panel     Component Value Date/Time   CHOL 165  01/15/2021 1032   TRIG 128 01/15/2021 1032   HDL 51 01/15/2021 1032   CHOLHDL 3.2 01/15/2021 1032   LDLCALC 91 01/15/2021 1032    CBC    Component Value Date/Time   WBC 6.0 01/15/2021 1032   WBC 6.1 08/23/2009 1029   RBC 5.28 01/15/2021 1032   RBC 4.90 08/23/2009 1029   HGB 15.5 01/15/2021 1032   HCT 45.9 01/15/2021 1032   PLT 280 01/15/2021 1032   MCV 87 01/15/2021 1032   MCH 29.4 01/15/2021 1032   MCHC 33.8 01/15/2021 1032   MCHC 32.9 08/23/2009 1029   RDW 12.6 01/15/2021 1032   LYMPHSABS 2.1 08/23/2009 1029   MONOABS 0.5 08/23/2009 1029   EOSABS 0.3 08/23/2009 1029   BASOSABS 0.0 08/23/2009 1029    ASSESSMENT AND PLAN:  There are no diagnoses linked to this encounter.   Patient was given the opportunity to ask questions.  Patient verbalized understanding of the plan and was able to repeat key elements of the plan. Patient was given clear instructions to go to Emergency Department or return to medical center if symptoms don't improve, worsen, or new problems develop.The patient verbalized understanding.   No orders of the defined types were placed in this encounter.    Requested Prescriptions    No prescriptions requested or ordered in this encounter    No follow-ups on file.  Camillia Herter, NP

## 2021-02-21 ENCOUNTER — Ambulatory Visit: Payer: BC Managed Care – PPO | Admitting: Family

## 2021-02-21 DIAGNOSIS — G4700 Insomnia, unspecified: Secondary | ICD-10-CM

## 2021-02-23 DIAGNOSIS — M25611 Stiffness of right shoulder, not elsewhere classified: Secondary | ICD-10-CM | POA: Diagnosis not present

## 2021-02-23 DIAGNOSIS — M25511 Pain in right shoulder: Secondary | ICD-10-CM | POA: Diagnosis not present

## 2021-03-05 DIAGNOSIS — Z09 Encounter for follow-up examination after completed treatment for conditions other than malignant neoplasm: Secondary | ICD-10-CM | POA: Diagnosis not present

## 2021-03-08 DIAGNOSIS — M25511 Pain in right shoulder: Secondary | ICD-10-CM | POA: Diagnosis not present

## 2021-03-08 DIAGNOSIS — M25611 Stiffness of right shoulder, not elsewhere classified: Secondary | ICD-10-CM | POA: Diagnosis not present

## 2021-03-11 NOTE — Progress Notes (Signed)
Patient ID: Billy Roman, male    DOB: Sep 27, 1960  MRN: [id number]  CC: Insomnia Follow-Up   Subjective: Billy Roman is a 60 y.o. male who presents for insomnia follow-up.  His concerns today include:   INSOMNIA FOLLOW-UP: 01/15/2021: - Begin Trazodone as prescribed. - Sleep study for further evaluation and management.   03/14/2021: Doing well on current regimen no issues or concerns.   2. ANXIETY DEPRESSION FOLLOW-UP: 01/15/2021: - Patient denies thoughts of self-harm, suicidal ideations, and homicidal ideations.  - Patient declined pharmacological therapy.  - Patient declined referral to Psychiatry.  - Continue counseling sessions with Education officer, museum.  03/14/2021: Patient reports he would rather not begin depression medication because of the possible side effects. Counseling sessions with social worker going well. Not ready for referral to Psychiatry as of present.    Depression screen [facility] 2/9 03/14/2021 01/15/2021 07/26/2019 04/18/2016 01/10/2016  Decreased Interest 3 3 0 0 0  Down, Depressed, Hopeless 2 2 0 0 0  PHQ - 2 Score 5 5 0 0 0  Altered sleeping 3 3 - - -  Tired, decreased energy 3 3 - - -  Change in appetite 2 2 - - -  Feeling bad or failure about yourself  0 2 - - -  Trouble concentrating 0 0 - - -  Moving slowly or fidgety/restless 1 2 - - -  Suicidal thoughts 0 0 - - -  PHQ-9 Score 14 17 - - -  Difficult doing work/chores Somewhat difficult Very difficult - - -     Patient Active Problem List   Diagnosis Date Noted   Stiffness of right shoulder joint 02/09/2021   Pain in joint of right shoulder 02/09/2021   Vitamin D deficiency 01/17/2021   HNP (herniated nucleus pulposus), lumbar 01/15/2021   Sciatica 01/15/2021   Anxiety and depression 08/23/2009     Current Outpatient Medications on File Prior to Visit  Medication Sig Dispense Refill   Cholecalciferol (VITAMIN D3) 25 MCG (1000 UT) CAPS Take 1 capsule (1,000 Units total) by mouth daily.  120 capsule 0   fluticasone (FLONASE) 50 MCG/ACT nasal spray Place 1 spray into both nostrils daily as needed for allergies or rhinitis.     hydrOXYzine (ATARAX/VISTARIL) 50 MG tablet hydroxyzine HCl 50 mg tablet  TAKE 1 TABLET BY MOUTH AT BEDTIME AND TWICE DAILY AS NEEDED FOR ANXIETY/INSOMNIA     meloxicam (MOBIC) 7.5 MG tablet meloxicam 7.5 mg tablet  TAKE 1 TABLET BY MOUTH TWICE A DAY WITH FOOD     nicotine polacrilex (NICORETTE) 4 MG gum Take 4 mg by mouth as needed for smoking cessation.     ondansetron (ZOFRAN) 4 MG tablet Take 1 tablet (4 mg total) by mouth every 8 (eight) hours as needed for nausea or vomiting. 30 tablet 0   No current facility-administered medications on file prior to visit.    Allergies  Allergen Reactions   Propyphenazone Anaphylaxis    Felt hot    Social History   Socioeconomic History   Marital status: Single    Spouse name: Not on file   Number of children: Not on file   Years of education: Not on file   Highest education level: Not on file  Occupational History   Not on file  Tobacco Use   Smoking status: Former    Packs/day: 1.00    Years: 30.00    Pack years: 30.00    Types: Cigarettes    Quit date: 11/21/2020  Years since quitting: 0.3   Smokeless tobacco: Former    Types: Chew    Quit date: 1990  Vaping Use   Vaping Use: Never used  Substance and Sexual Activity   Alcohol use: No    Comment: rarely   Drug use: No   Sexual activity: Not Currently  Other Topics Concern   Not on file  Social History Narrative   Not on file   Social Determinants of Health   Financial Resource Strain: Not on file  Food Insecurity: Not on file  Transportation Needs: Not on file  Physical Activity: Not on file  Stress: Not on file  Social Connections: Not on file  Intimate Partner Violence: Not on file    No family history on file.  Past Surgical History:  Procedure Laterality Date   RHINOPLASTY     SEPTOPLASTY     SHOULDER ARTHROSCOPY  Left    SHOULDER ARTHROSCOPY WITH OPEN ROTATOR CUFF REPAIR AND DISTAL CLAVICLE ACROMINECTOMY Right 02/01/2021   Procedure: RIGHT SHOULDER ARTHROSCOPY WITH MINI OPEN ROTATOR CUFF REPAIR AND DISTAL CLAVICLE EXCISION;  Surgeon: Thornton Park, MD;  Location: [facility] OR[facility];  Service: Orthopedics;  Laterality: Right;   SPINE SURGERY     lumbar laminectomy   WRIST SURGERY Bilateral    corpal tunnel    ROS: Review of Systems Negative except as stated above  PHYSICAL EXAM: Temp 98.3 F (36.8 C)   Resp 16   Ht 6' 0.8" (1.849 m)   Wt 231 lb 3.2 oz (104.9 kg)   BMI 30.68 kg/m   Physical Exam HENT:     Head: Normocephalic and atraumatic.  Eyes:     Extraocular Movements: Extraocular movements intact.     Conjunctiva/sclera: Conjunctivae normal.     Pupils: Pupils are equal, round, and reactive to light.  Cardiovascular:     Rate and Rhythm: Normal rate and regular rhythm.     Pulses: Normal pulses.     Heart sounds: Normal heart sounds.  Pulmonary:     Effort: Pulmonary effort is normal.     Breath sounds: Normal breath sounds.  Musculoskeletal:     Cervical back: Normal range of motion and neck supple.  Neurological:     General: No focal deficit present.     Mental Status: He is alert and oriented to person, place, and time.  Psychiatric:        Mood and Affect: Mood is anxious.    ASSESSMENT AND PLAN: 1. Insomnia, unspecified type: - Continue Trazodone as prescribed.  - Follow-up with primary provider as scheduled. - traZODone (DESYREL) 50 MG tablet; Take 0.5 tablets (25 mg total) by mouth at bedtime.  Dispense: 15 tablet; Refill: 2  2. Anxiety and depression: - Patient denies thoughts of self-harm, suicidal ideations, and homicidal ideations.  - Patient declined pharmacological therapy.  - Patient declined referral to Psychiatry.  - Continue counseling sessions with Education officer, museum. - Follow-up with primary provider as scheduled.   3. Need for influenza vaccination: -  Administered today in office.  - Flu Vaccine QUAD 57mo+IM (Fluarix, Fluzone & Alfiuria Quad PF)    Patient was given the opportunity to ask questions.  Patient verbalized understanding of the plan and was able to repeat key elements of the plan. Patient was given clear instructions to go to Emergency Department or return to medical center if symptoms don't improve, worsen, or new problems develop.The patient verbalized understanding.   Orders Placed This Encounter  Procedures   Flu Vaccine QUAD  76mo+IM (Fluarix, Fluzone & Alfiuria Quad PF)     Requested Prescriptions   Signed Prescriptions Disp Refills   traZODone (DESYREL) 50 MG tablet 15 tablet 2    Sig: Take 0.5 tablets (25 mg total) by mouth at bedtime.    Return in about 3 months (around 06/13/2021) for Follow-Up or next available insomnia .  Camillia Herter, NP

## 2021-03-14 ENCOUNTER — Other Ambulatory Visit: Payer: Self-pay

## 2021-03-14 ENCOUNTER — Ambulatory Visit (INDEPENDENT_AMBULATORY_CARE_PROVIDER_SITE_OTHER): Payer: BC Managed Care – PPO | Admitting: Family

## 2021-03-14 VITALS — Temp 98.3°F | Resp 16 | Ht 72.8 in | Wt 231.2 lb

## 2021-03-14 DIAGNOSIS — F419 Anxiety disorder, unspecified: Secondary | ICD-10-CM | POA: Diagnosis not present

## 2021-03-14 DIAGNOSIS — M25511 Pain in right shoulder: Secondary | ICD-10-CM | POA: Diagnosis not present

## 2021-03-14 DIAGNOSIS — Z23 Encounter for immunization: Secondary | ICD-10-CM | POA: Diagnosis not present

## 2021-03-14 DIAGNOSIS — F32A Depression, unspecified: Secondary | ICD-10-CM

## 2021-03-14 DIAGNOSIS — G4700 Insomnia, unspecified: Secondary | ICD-10-CM | POA: Diagnosis not present

## 2021-03-14 DIAGNOSIS — M25611 Stiffness of right shoulder, not elsewhere classified: Secondary | ICD-10-CM | POA: Diagnosis not present

## 2021-03-14 DIAGNOSIS — R0683 Snoring: Secondary | ICD-10-CM

## 2021-03-14 MED ORDER — TRAZODONE HCL 50 MG PO TABS: 25.0000 mg | ORAL_TABLET | Freq: Every day | ORAL | 2 refills | Status: DC

## 2021-03-14 NOTE — Progress Notes (Signed)
Pt presents for insomnia follow-up, wants to discuss depression medication

## 2021-03-23 ENCOUNTER — Ambulatory Visit (INDEPENDENT_AMBULATORY_CARE_PROVIDER_SITE_OTHER): Payer: BC Managed Care – PPO

## 2021-03-23 ENCOUNTER — Other Ambulatory Visit: Payer: Self-pay

## 2021-03-23 DIAGNOSIS — Z23 Encounter for immunization: Secondary | ICD-10-CM

## 2021-03-26 DIAGNOSIS — M25511 Pain in right shoulder: Secondary | ICD-10-CM | POA: Diagnosis not present

## 2021-03-27 DIAGNOSIS — M25611 Stiffness of right shoulder, not elsewhere classified: Secondary | ICD-10-CM | POA: Diagnosis not present

## 2021-03-27 DIAGNOSIS — M25511 Pain in right shoulder: Secondary | ICD-10-CM | POA: Diagnosis not present

## 2021-03-28 ENCOUNTER — Encounter: Payer: Self-pay | Admitting: Family

## 2021-03-29 DIAGNOSIS — M25511 Pain in right shoulder: Secondary | ICD-10-CM | POA: Diagnosis not present

## 2021-03-29 DIAGNOSIS — M25611 Stiffness of right shoulder, not elsewhere classified: Secondary | ICD-10-CM | POA: Diagnosis not present

## 2021-04-04 DIAGNOSIS — M25611 Stiffness of right shoulder, not elsewhere classified: Secondary | ICD-10-CM | POA: Diagnosis not present

## 2021-04-04 DIAGNOSIS — M25511 Pain in right shoulder: Secondary | ICD-10-CM | POA: Diagnosis not present

## 2021-04-09 DIAGNOSIS — M79674 Pain in right toe(s): Secondary | ICD-10-CM | POA: Diagnosis not present

## 2021-04-11 DIAGNOSIS — M25511 Pain in right shoulder: Secondary | ICD-10-CM | POA: Diagnosis not present

## 2021-04-11 DIAGNOSIS — M25611 Stiffness of right shoulder, not elsewhere classified: Secondary | ICD-10-CM | POA: Diagnosis not present

## 2021-04-13 DIAGNOSIS — M25511 Pain in right shoulder: Secondary | ICD-10-CM | POA: Diagnosis not present

## 2021-04-13 DIAGNOSIS — M25611 Stiffness of right shoulder, not elsewhere classified: Secondary | ICD-10-CM | POA: Diagnosis not present

## 2021-04-17 DIAGNOSIS — M25611 Stiffness of right shoulder, not elsewhere classified: Secondary | ICD-10-CM | POA: Diagnosis not present

## 2021-04-17 DIAGNOSIS — M25511 Pain in right shoulder: Secondary | ICD-10-CM | POA: Diagnosis not present

## 2021-05-16 DIAGNOSIS — M25511 Pain in right shoulder: Secondary | ICD-10-CM | POA: Diagnosis not present

## 2021-05-17 ENCOUNTER — Encounter: Payer: Self-pay | Admitting: Family

## 2021-05-17 ENCOUNTER — Other Ambulatory Visit: Payer: Self-pay | Admitting: Family

## 2021-05-17 DIAGNOSIS — Z1211 Encounter for screening for malignant neoplasm of colon: Secondary | ICD-10-CM

## 2021-05-28 DIAGNOSIS — F331 Major depressive disorder, recurrent, moderate: Secondary | ICD-10-CM | POA: Diagnosis not present

## 2021-05-30 DIAGNOSIS — M25511 Pain in right shoulder: Secondary | ICD-10-CM | POA: Diagnosis not present

## 2021-05-30 DIAGNOSIS — M7501 Adhesive capsulitis of right shoulder: Secondary | ICD-10-CM | POA: Diagnosis not present

## 2021-05-30 NOTE — Progress Notes (Signed)
Patient ID: ROHIN KREJCI, male    DOB: [birth year]  MRN: [id number]  CC: ANXIETY DEPRESSION FOLLOW-UP  Subjective: Billy Roman is a 60 y.o. male who presents for anxiety depression follow-up.  His concerns today include:   ANXIETY DEPRESSION FOLLOW-UP: 03/14/2021: - Patient declined pharmacological therapy.  - Patient declined referral to Psychiatry.  - Continue counseling sessions with Education officer, museum.   06/04/2021: Anxiety depression increasing especially since the holidays. Reports history of seasonal depressive disorder. Was taking Lithium in the past. Feeling alone since being divorced 2 years ago. His daughter and his grandchildren have moved away. Having less energy and decreased concentration. Feeling disorganized and overwhelmed. 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 Doesn't feel happy or sad just feels to be in the middle. Reports as a loaded gun at home that he keeps by his bed. States he doesn't feel like being here. When asked if he has plans of hurting himself, killing himself, or others he denies. Reports he has always had the gun in the home and uses on the animals in the backyard. Drinking a mixed alcohol beverage about every night or every of night to help with sleep. Seeing a therapist Steffanie Rainwater weekly or biweekly reporting she thinks its time for the patient to begin an antidepressant and he agrees.  2. COLONOSCOPY UPDATE: Has not received appointment as of present.    Depression screen [facility] 2/9 06/04/2021 03/14/2021 01/15/2021 07/26/2019 04/18/2016  Decreased Interest 3 3 3  0 0  Down, Depressed, Hopeless 2 2 2  0 0  PHQ - 2 Score 5 5 5  0 0  Altered sleeping 3 3 3  - -  Tired, decreased energy 3 3 3  - -  Change in appetite 2 2 2  - -  Feeling bad or failure about yourself  0 0 2 - -  Trouble concentrating 0 0 0 - -  Moving slowly or fidgety/restless 0 1 2 - -  Suicidal thoughts 0 0 0 - -  PHQ-9 Score 13 14 17  - -  Difficult  doing work/chores Somewhat difficult Somewhat difficult Very difficult - -     Patient Active Problem List   Diagnosis Date Noted   Stiffness of right shoulder joint 02/09/2021   Pain in joint of right shoulder 02/09/2021   Vitamin D deficiency 01/17/2021   HNP (herniated nucleus pulposus), lumbar 01/15/2021   Sciatica 01/15/2021   Anxiety and depression 08/23/2009     Current Outpatient Medications on File Prior to Visit  Medication Sig Dispense Refill   fluticasone (FLONASE) 50 MCG/ACT nasal spray Place 1 spray into both nostrils daily as needed for allergies or rhinitis.     hydrOXYzine (ATARAX/VISTARIL) 50 MG tablet hydroxyzine HCl 50 mg tablet  TAKE 1 TABLET BY MOUTH AT BEDTIME AND TWICE DAILY AS NEEDED FOR ANXIETY/INSOMNIA     meloxicam (MOBIC) 7.5 MG tablet meloxicam 7.5 mg tablet  TAKE 1 TABLET BY MOUTH TWICE A DAY WITH FOOD     nicotine polacrilex (NICORETTE) 4 MG gum Take 4 mg by mouth as needed for smoking cessation.     traZODone (DESYREL) 50 MG tablet Take 0.5 tablets (25 mg total) by mouth at bedtime. 15 tablet 2   No current facility-administered medications on file prior to visit.    Allergies  Allergen Reactions   Propyphenazone Anaphylaxis    Felt hot    Social History   Socioeconomic History   Marital status: Single    Spouse name:  Not on file   Number of children: Not on file   Years of education: Not on file   Highest education level: Not on file  Occupational History   Not on file  Tobacco Use   Smoking status: Former    Packs/day: 1.00    Years: 30.00    Pack years: 30.00    Types: Cigarettes    Quit date: 11/21/2020    Years since quitting: 0.5   Smokeless tobacco: Former    Types: Chew    Quit date: 1990  Vaping Use   Vaping Use: Never used  Substance and Sexual Activity   Alcohol use: No    Comment: rarely   Drug use: No   Sexual activity: Not Currently  Other Topics Concern   Not on file  Social History Narrative   Not on file    Social Determinants of Health   Financial Resource Strain: Not on file  Food Insecurity: Not on file  Transportation Needs: Not on file  Physical Activity: Not on file  Stress: Not on file  Social Connections: Not on file  Intimate Partner Violence: Not on file    No family history on file.  Past Surgical History:  Procedure Laterality Date   RHINOPLASTY     SEPTOPLASTY     SHOULDER ARTHROSCOPY Left    SHOULDER ARTHROSCOPY WITH OPEN ROTATOR CUFF REPAIR AND DISTAL CLAVICLE ACROMINECTOMY Right 02/01/2021   Procedure: RIGHT SHOULDER ARTHROSCOPY WITH MINI OPEN ROTATOR CUFF REPAIR AND DISTAL CLAVICLE EXCISION;  Surgeon: Thornton Park, MD;  Location: [facility] ORS;  Service: Orthopedics;  Laterality: Right;   SPINE SURGERY     lumbar laminectomy   WRIST SURGERY Bilateral    corpal tunnel    ROS: Review of Systems Negative except as stated above  PHYSICAL EXAM: BP 132/82 (BP Location: Left Arm, Patient Position: Sitting, Cuff Size: Normal)    Pulse 87    Temp 98.5 F (36.9 C)    Resp 18    Ht 6' 0.8" (1.849 m)    Wt 236 lb (107 kg)    SpO2 98%    BMI 31.31 kg/m   Physical Exam HENT:     Head: Normocephalic and atraumatic.  Eyes:     Extraocular Movements: Extraocular movements intact.     Conjunctiva/sclera: Conjunctivae normal.     Pupils: Pupils are equal, round, and reactive to light.  Cardiovascular:     Rate and Rhythm: Normal rate.     Pulses: Normal pulses.     Heart sounds: Normal heart sounds.  Pulmonary:     Effort: Pulmonary effort is normal.     Breath sounds: Normal breath sounds.  Musculoskeletal:     Cervical back: Normal range of motion and neck supple.  Neurological:     General: No focal deficit present.     Mental Status: He is alert and oriented to person, place, and time.  Psychiatric:        Mood and Affect: Mood is anxious.   ASSESSMENT AND PLAN: 1. Anxiety and depression: - Patient denies thoughts of self-harm, suicidal ideations, homicidal  ideations. - Begin Sertraline as prescribed. Instructed to take in the mornings, patient verbalized understanding.  Do not drink alcohol or use illicit substances with with this medication.  Avoid driving or hazardous activity until you know how this medication will affect you. Your reactions could be impaired. Dizziness or fainting can cause falls, accidents, or severe injuries. Common side effects include drowsiness, nausea, constipation, loss  of appetite, dry mouth, increased sweating. Call your provider if you have pounding heartbeats or fluttering in your chest, a light-headed feeling like you may pass out, easy bruising/unusal bleeding, vision change, difficult or painful urination, impotence/sexual problems, liver problems (right-sided upper stomach pain, itching, dark urine, yellowing of skin or eyes/jaundice, low levels of sodium in the body (headache, confusion, slurred speech, severe weakness, vomiting, loss of coordination, feeling unsteady), or manic episodes (racing thoughts, increased energy, decreased need for sleep, risk-taking behavior, being agitated, talkative) Seek medical attention immediately if you have symptoms of serotonin syndrome such as agitation, hallucinations, fever, sweating, shivering, fast heart rate, muscle stiffness, twitching, loss of coordination, nausea, vomiting, or diarrhea Report any new or worsening symptoms to your provider, such as but not limited to: mood or behavior changes, anxiety, panic attacks, trouble sleeping, or if you feel impulsive, irritable, agitated, hostile, aggressive, restless, hyperactive (mentally or physically), more depressed, or have thoughts about suicide or hurting yourself - Continue counseling sessions with Steffanie Rainwater.  - Referral to [facility], LCSW for additional counseling and community resources.  - Patient provided with contact information to [facility].  - Referral to Psychiatry for further  evaluation and management.  - Follow-up with primary provider in 4 weeks or sooner if needed.  - sertraline (ZOLOFT) 25 MG tablet; Take 1 tablet (25 mg total) by mouth daily.  Dispense: 30 tablet; Refill: 0 - Ambulatory referral to Psychiatry  2. Colon cancer screening: - Patient provided with contact information to [facility] where he currently has a referral in the work queue.   3. Need for shingles vaccine: - Administered today in office.  - Varicella-zoster vaccine IM   Patient was given the opportunity to ask questions.  Patient verbalized understanding of the plan and was able to repeat key elements of the plan. Patient was given clear instructions to go to Emergency Department or return to medical center if symptoms don't improve, worsen, or new problems develop.The patient verbalized understanding.   Orders Placed This Encounter  Procedures   Varicella-zoster vaccine IM   Ambulatory referral to Psychiatry     Requested Prescriptions   Signed Prescriptions Disp Refills   sertraline (ZOLOFT) 25 MG tablet 30 tablet 0    Sig: Take 1 tablet (25 mg total) by mouth daily.    No follow-ups on file.  Camillia Herter, NP

## 2021-06-04 ENCOUNTER — Encounter: Payer: Self-pay | Admitting: Family

## 2021-06-04 ENCOUNTER — Telehealth: Payer: Self-pay | Admitting: Family

## 2021-06-04 ENCOUNTER — Ambulatory Visit (INDEPENDENT_AMBULATORY_CARE_PROVIDER_SITE_OTHER): Payer: BC Managed Care – PPO | Admitting: Family

## 2021-06-04 ENCOUNTER — Other Ambulatory Visit: Payer: Self-pay

## 2021-06-04 VITALS — BP 132/82 | HR 87 | Temp 98.5°F | Resp 18 | Ht 72.8 in | Wt 236.0 lb

## 2021-06-04 DIAGNOSIS — Z23 Encounter for immunization: Secondary | ICD-10-CM | POA: Diagnosis not present

## 2021-06-04 DIAGNOSIS — F32A Depression, unspecified: Secondary | ICD-10-CM | POA: Diagnosis not present

## 2021-06-04 DIAGNOSIS — Z1211 Encounter for screening for malignant neoplasm of colon: Secondary | ICD-10-CM

## 2021-06-04 DIAGNOSIS — F419 Anxiety disorder, unspecified: Secondary | ICD-10-CM | POA: Diagnosis not present

## 2021-06-04 MED ORDER — SERTRALINE HCL 25 MG PO TABS: 25.0000 mg | ORAL_TABLET | Freq: Every day | ORAL | 0 refills | Status: DC

## 2021-06-04 NOTE — Patient Instructions (Addendum)
[facility] Address: [address] [place], [place] [address], Phone: [phone]. Depression Screening Depression screening is a tool that your health care provider can use to learn if you have symptoms of depression. Depression is a common condition with many symptoms that are also often found in other conditions. Depression is treatable, but it must first be diagnosed. You may not know that certain feelings, thoughts, and behaviors that you are having can be symptoms of depression. Taking a depression screening test can help you and your health care provider decide if you need more assessment, or if you should be referred to a mental health care provider. What are the screening tests? You may have a physical exam to see if another condition is affecting your mental health. You may have a blood or urine sample taken during the physical exam. You may be interviewed or offered a written test using a screening tool that was developed from research, such as one of these: Patient Health Questionnaire (PHQ). This is a set of either 2 or 9 questions. A health care provider who has been trained to score this screening test uses a guide to assess if your symptoms suggest that you may have depression. Hamilton Depression Rating Scale (HAM-D). This is a set of either 17 or 24 questions. You may be asked to take it again during or after your treatment, to see if your depression has gotten better. Beck Depression Inventory (BDI). This is a set of 21 multiple choice questions. Your health care provider scores your answers to assess: Your level of depression, ranging from mild to severe. Your response to treatment. Your health care provider may talk with you about your daily activities, such as eating, sleeping, work, and recreation, and ask if you have had any changes in activity. Your health care provider may ask you to see a mental health specialist, such as a psychiatrist or  psychologist, for more evaluation. Who should be screened for depression?  All adults, including adults with a family history of a mental health disorder. People who are 75-20 years old. People who are recovering from an acute condition, such as myocardial infarction (MI) or stroke. Pregnant women, or women who have given birth. People who have a long-term (chronic) illness. Anyone who has been diagnosed with another type of mental health disorder. Anyone who has symptoms that could show depression. What do my results mean? Your health care provider will review the results of your depression screening, physical exam, and lab tests. Positive screens suggest that you may have depression. Screening is the first step in getting the care that you may need. It will be important for you to know the results of your tests. Ask your health care provider, or the department that is doing your screening tests, when your results will be ready. Talk with your health care provider about your results, diagnosis, and recommendations for follow-up. A diagnosis of depression is made using information from the Diagnostic and Statistical Manual of Mental Disorders (DSM-5). This is a book that lists the number and type of symptoms that must be present for a health care provider to give a specific diagnosis. Your health care provider may work with you to treat your symptoms of depression, or your health care provider may help you find a mental health provider who can assess and help you develop a plan to treat your depression. Get help right away if: You have thoughts about hurting yourself or others. If you ever feel  like you may hurt yourself or others, or have thoughts about taking your own life, get help right away. Go to your nearest emergency department or: Call your local emergency services (911 in the [place] Call a suicide crisis helpline, such as the [place] at 404-455-7944 or 988  in the [place]. This is open 24 hours a day in the [place] Text the Crisis Text Line at 713-444-1395 (in the [place].). Summary Depression screening is the first step in getting the help that you may need. If your screening test shows symptoms of depression (is positive), your health care provider may ask you to see a mental health provider who will help identify ways to treat your depression. Anyone aged 91 or older should be screened for depression. This information is not intended to replace advice given to you by your health care provider. Make sure you discuss any questions you have with your health care provider. Document Revised: 12/27/2020 Document Reviewed: 09/11/2020 Elsevier Patient Education  [place].

## 2021-06-05 DIAGNOSIS — F331 Major depressive disorder, recurrent, moderate: Secondary | ICD-10-CM | POA: Diagnosis not present

## 2021-06-26 NOTE — Telephone Encounter (Signed)
I attempted to call this pt, no answer, unable to leave vm.

## 2021-07-01 ENCOUNTER — Other Ambulatory Visit: Payer: Self-pay | Admitting: Family

## 2021-07-01 DIAGNOSIS — F32A Depression, unspecified: Secondary | ICD-10-CM

## 2021-07-01 DIAGNOSIS — F419 Anxiety disorder, unspecified: Secondary | ICD-10-CM

## 2021-07-09 ENCOUNTER — Ambulatory Visit: Payer: BC Managed Care – PPO | Admitting: Family

## 2021-07-20 ENCOUNTER — Telehealth: Payer: BC Managed Care – PPO | Admitting: Physician Assistant

## 2021-07-20 ENCOUNTER — Other Ambulatory Visit: Payer: Self-pay | Admitting: Family

## 2021-07-20 ENCOUNTER — Encounter: Payer: Self-pay | Admitting: Physician Assistant

## 2021-07-20 DIAGNOSIS — J069 Acute upper respiratory infection, unspecified: Secondary | ICD-10-CM

## 2021-07-20 DIAGNOSIS — G4700 Insomnia, unspecified: Secondary | ICD-10-CM

## 2021-07-20 MED ORDER — BENZONATATE 100 MG PO CAPS: 100.0000 mg | ORAL_CAPSULE | Freq: Three times a day (TID) | ORAL | 0 refills | Status: DC | PRN

## 2021-07-20 MED ORDER — TRAZODONE HCL 50 MG PO TABS: 25.0000 mg | ORAL_TABLET | Freq: Every day | ORAL | 2 refills | Status: DC

## 2021-07-20 MED ORDER — COVID-19 AT HOME ANTIGEN TEST VI KIT: PACK | 0 refills | Status: DC

## 2021-07-20 NOTE — Progress Notes (Signed)
Virtual Visit Consent   Billy Roman, you are scheduled for a virtual visit with a [facility] provider today.     Just as with appointments in the office, your consent must be obtained to participate.  Your consent will be active for this visit and any virtual visit you may have with one of our providers in the next 365 days.     If you have a MyChart account, a copy of this consent can be sent to you electronically.  All virtual visits are billed to your insurance company just like a traditional visit in the office.    As this is a virtual visit, video technology does not allow for your provider to perform a traditional examination.  This may limit your provider's ability to fully assess your condition.  If your provider identifies any concerns that need to be evaluated in person or the need to arrange testing (such as labs, EKG, etc.), we will make arrangements to do so.     Although advances in technology are sophisticated, we cannot ensure that it will always work on either your end or our end.  If the connection with a video visit is poor, the visit may have to be switched to a telephone visit.  With either a video or telephone visit, we are not always able to ensure that we have a secure connection.     I need to obtain your verbal consent now.   Are you willing to proceed with your visit today?    Billy Roman has provided verbal consent on 07/20/2021 for a virtual visit (video or telephone).   Billy Roman, [place]   Date: 07/20/2021 11:46 AM   Virtual Visit via Video Note   I, Billy Roman, connected with  Billy Roman  ([id number], [birth year]) on 07/20/21 at 11:45 AM EST by a video-enabled telemedicine application and verified that I am speaking with the correct person using two identifiers.  Location: Patient: Virtual Visit Location Patient: Home Provider: Virtual Visit Location Provider: Home Office   I discussed the limitations of evaluation and  management by telemedicine and the availability of in person appointments. The patient expressed understanding and agreed to proceed.    History of Present Illness: Billy Roman is a 61 y.o. who identifies as a male who was assigned male at birth, and is being seen today for URI symptoms starting 3 days ago. Gradual onset of nasal congestion, chest congestion, cough and fatigue with sore throat. Some chills but no fever after first day. Denies any significant body aches. Just quite fatigued still. Appetite has been good. Endorses good hydration. Has not taken anything OTC for symptoms. Several coworkers sick with URI symptoms recently. No known COVID exposure. Took home COVID test this morning which was negative.   HPI: HPI  Problems:  Patient Active Problem List   Diagnosis Date Noted   Stiffness of right shoulder joint 02/09/2021   Pain in joint of right shoulder 02/09/2021   Vitamin D deficiency 01/17/2021   HNP (herniated nucleus pulposus), lumbar 01/15/2021   Sciatica 01/15/2021   Anxiety and depression 08/23/2009    Allergies:  Allergies  Allergen Reactions   Propyphenazone Anaphylaxis    Felt hot   Medications:  Current Outpatient Medications:    benzonatate (TESSALON) 100 MG capsule, Take 1 capsule (100 mg total) by mouth 3 (three) times daily as needed for cough., Disp: 30 capsule, Rfl: 0   COVID-19 At Home Antigen Test  KIT, Use as directed., Disp: 1 kit, Rfl: 0   sertraline (ZOLOFT) 25 MG tablet, TAKE 1 TABLET (25 MG TOTAL) BY MOUTH DAILY., Disp: 30 tablet, Rfl: 0   traZODone (DESYREL) 50 MG tablet, Take 0.5 tablets (25 mg total) by mouth at bedtime., Disp: 15 tablet, Rfl: 2  Observations/Objective: Patient is well-developed, well-nourished in no acute distress.  Resting comfortably at home.  Head is normocephalic, atraumatic.  No labored breathing. Speech is clear and coherent with logical content.  Patient is alert and oriented at baseline.   Assessment and  Plan: 1. Viral URI with cough - benzonatate (TESSALON) 100 MG capsule; Take 1 capsule (100 mg total) by mouth 3 (three) times daily as needed for cough.  Dispense: 30 capsule; Refill: 0 - COVID-19 At Home Antigen Test KIT; Use as directed.  Dispense: 1 kit; Refill: 0  Supportive measures and OTC medications reviewed. Will have him repeat COVID test to be cautious. He is to notify us if positive. Tessalon for cough. Vitamin recommendations reviewed. Strict ER precautions discussed.   Follow Up Instructions: I discussed the assessment and treatment plan with the patient. The patient was provided an opportunity to ask questions and all were answered. The patient agreed with the plan and demonstrated an understanding of the instructions.  A copy of instructions were sent to the patient via MyChart unless otherwise noted below.   The patient was advised to call back or seek an in-person evaluation if the symptoms worsen or if the condition fails to improve as anticipated.  Time:  I spent 10 minutes with the patient via telehealth technology discussing the above problems/concerns.    Billy Rio, PA-C

## 2021-07-20 NOTE — Patient Instructions (Addendum)
°  Billy Roman, thank you for joining [name], PA-C for today's virtual visit.  While this provider is not your primary care provider (PCP), if your PCP is located in our provider database this encounter information will be shared with them immediately following your visit.  Consent: (Patient) Billy Roman provided verbal consent for this virtual visit at the beginning of the encounter  Current Medications:  Current Outpatient Medications:    sertraline (ZOLOFT) 25 MG tablet, TAKE 1 TABLET (25 MG TOTAL) BY MOUTH DAILY., Disp: 30 tablet, Rfl: 0   traZODone (DESYREL) 50 MG tablet, Take 0.5 tablets (25 mg total) by mouth at bedtime., Disp: 15 tablet, Rfl: 2   Medications ordered in this encounter:  No orders of the defined types were placed in this encounter.    *If you need refills on other medications prior to your next appointment, please contact your pharmacy*  Follow-Up: Call back or seek an in-person evaluation if the symptoms worsen or if the condition fails to improve as anticipated.  Other Instructions lease keep well-hydrated and get plenty of rest. Start the [facility] for cough. Take the repeat COVID test at least 24 hours from last test. Let us know if positive. Otherwise continue supportive measures discussed at visit. Use the Tessalon for cough. Start Vitamin D3 1000 units daily, Vitamin C 1000 mg daily and a zinc supplement. .   If you have been instructed to have an in-person evaluation today at a local Urgent Care facility, please use the link below. It will take you to a list of all of our available [facility], including address, phone number and hours of operation. Please do not delay care.  [facility]  If you or a family member do not have a primary care provider, use the link below to schedule a visit and establish care. When you choose a [facility] primary care physician or advanced practice provider, you gain a  long-term partner in health. Find a Primary Care Provider  Learn more about [facility]'s in-office and virtual care options: [facility]

## 2021-08-01 DIAGNOSIS — Z03818 Encounter for observation for suspected exposure to other biological agents ruled out: Secondary | ICD-10-CM | POA: Diagnosis not present

## 2021-08-01 DIAGNOSIS — Z20822 Contact with and (suspected) exposure to covid-19: Secondary | ICD-10-CM | POA: Diagnosis not present

## 2021-08-08 ENCOUNTER — Encounter: Payer: Self-pay | Admitting: Family

## 2021-08-09 NOTE — Telephone Encounter (Signed)
Spoke to pt advised that current dosage of Trazodone has a refill at pharmacy, pt aware appt will be needed for increased dose of the medication pt states he will notify us by Mychart about his work schedule so he can complete appt

## 2021-08-22 ENCOUNTER — Other Ambulatory Visit: Payer: Self-pay

## 2021-08-22 ENCOUNTER — Encounter (HOSPITAL_COMMUNITY): Payer: Self-pay | Admitting: Nurse Practitioner

## 2021-08-22 ENCOUNTER — Ambulatory Visit (HOSPITAL_COMMUNITY)
Admission: EM | Admit: 2021-08-22 | Discharge: 2021-08-23 | Disposition: A | Discharge status: Other Acute Inpatient Hospital | Payer: BC Managed Care – PPO | Attending: Nurse Practitioner | Admitting: Nurse Practitioner

## 2021-08-22 ENCOUNTER — Other Ambulatory Visit: Payer: Self-pay | Admitting: Family

## 2021-08-22 DIAGNOSIS — Z79899 Other long term (current) drug therapy: Secondary | ICD-10-CM | POA: Diagnosis not present

## 2021-08-22 DIAGNOSIS — Z20822 Contact with and (suspected) exposure to covid-19: Secondary | ICD-10-CM | POA: Diagnosis not present

## 2021-08-22 DIAGNOSIS — F332 Major depressive disorder, recurrent severe without psychotic features: Secondary | ICD-10-CM | POA: Diagnosis not present

## 2021-08-22 DIAGNOSIS — R45851 Suicidal ideations: Secondary | ICD-10-CM | POA: Insufficient documentation

## 2021-08-22 DIAGNOSIS — F411 Generalized anxiety disorder: Secondary | ICD-10-CM | POA: Insufficient documentation

## 2021-08-22 DIAGNOSIS — F1020 Alcohol dependence, uncomplicated: Secondary | ICD-10-CM | POA: Diagnosis not present

## 2021-08-22 DIAGNOSIS — F32A Depression, unspecified: Secondary | ICD-10-CM

## 2021-08-22 DIAGNOSIS — F419 Anxiety disorder, unspecified: Secondary | ICD-10-CM

## 2021-08-22 LAB — LIPID PANEL
Cholesterol: 147 mg/dL (ref 0–200)
HDL: 49 mg/dL (ref >40)
LDL Cholesterol: 76 mg/dL (ref 0–99)
Total CHOL/HDL Ratio: 3 RATIO
Triglycerides: 109 mg/dL (ref <150)
VLDL: 22 mg/dL (ref 0–40)

## 2021-08-22 LAB — COMPREHENSIVE METABOLIC PANEL
ALT: 14 U/L (ref 0–44)
AST: 23 U/L (ref 15–41)
Albumin: 4 g/dL (ref 3.5–5.0)
Alkaline Phosphatase: 74 U/L (ref 38–126)
Anion gap: 12 (ref 5–15)
BUN: 6 mg/dL (ref 6–20)
CO2: 21 mmol/L — ABNORMAL LOW (ref 22–32)
Calcium: 9.2 mg/dL (ref 8.9–10.3)
Chloride: 103 mmol/L (ref 98–111)
Creatinine, Ser: 0.64 mg/dL (ref 0.61–1.24)
GFR, Estimated: >60 mL/min (ref >60)
Glucose, Bld: 84 mg/dL (ref 70–99)
Potassium: 3.6 mmol/L (ref 3.5–5.1)
Sodium: 136 mmol/L (ref 135–145)
Total Bilirubin: 0.5 mg/dL (ref 0.3–1.2)
Total Protein: 7.5 g/dL (ref 6.5–8.1)

## 2021-08-22 LAB — CBC WITH DIFFERENTIAL/PLATELET
Abs Immature Granulocytes: 0.02 10*3/uL (ref 0.00–0.07)
Basophils Absolute: 0.1 10*3/uL (ref 0.0–0.1)
Basophils Relative: 1 %
Eosinophils Absolute: 0.2 10*3/uL (ref 0.0–0.5)
Eosinophils Relative: 3 %
HCT: 48.5 % (ref 39.0–52.0)
Hemoglobin: 16.2 g/dL (ref 13.0–17.0)
Immature Granulocytes: 0 %
Lymphocytes Relative: 42 %
Lymphs Abs: 2.8 10*3/uL (ref 0.7–4.0)
MCH: 29 pg (ref 26.0–34.0)
MCHC: 33.4 g/dL (ref 30.0–36.0)
MCV: 86.9 fL (ref 80.0–100.0)
Monocytes Absolute: 0.5 10*3/uL (ref 0.1–1.0)
Monocytes Relative: 8 %
Neutro Abs: 3.1 10*3/uL (ref 1.7–7.7)
Neutrophils Relative %: 46 %
Platelets: 298 10*3/uL (ref 150–400)
RBC: 5.58 MIL/uL (ref 4.22–5.81)
RDW: 14.1 % (ref 11.5–15.5)
WBC: 6.7 10*3/uL (ref 4.0–10.5)
nRBC: 0 % (ref 0.0–0.2)

## 2021-08-22 LAB — TSH: TSH: 1.514 u[IU]/mL (ref 0.350–4.500)

## 2021-08-22 LAB — ETHANOL: Alcohol, Ethyl (B): 82 mg/dL — ABNORMAL HIGH (ref <10)

## 2021-08-22 LAB — MAGNESIUM: Magnesium: 2.2 mg/dL (ref 1.7–2.4)

## 2021-08-22 LAB — RESP PANEL BY RT-PCR (FLU A&B, COVID) ARPGX2
Influenza A by PCR: NEGATIVE
Influenza B by PCR: NEGATIVE
SARS Coronavirus 2 by RT PCR: NEGATIVE

## 2021-08-22 LAB — POC SARS CORONAVIRUS 2 AG -  ED: SARS Coronavirus 2 Ag: NEGATIVE

## 2021-08-22 LAB — HEMOGLOBIN A1C
Hgb A1c MFr Bld: 5.2 % (ref 4.8–5.6)
Mean Plasma Glucose: 102.54 mg/dL

## 2021-08-22 LAB — POC SARS CORONAVIRUS 2 AG: SARSCOV2ONAVIRUS 2 AG: NEGATIVE

## 2021-08-22 MED ORDER — DICYCLOMINE HCL 20 MG PO TABS
20.0000 mg | ORAL_TABLET | Freq: Three times a day (TID) | ORAL | Status: DC | PRN
Start: 2021-08-22 — End: 2021-08-23

## 2021-08-22 MED ORDER — THIAMINE HCL 100 MG PO TABS
100.0000 mg | ORAL_TABLET | Freq: Every day | ORAL | Status: DC
  Administered 2021-08-22 – 2021-08-23 (×2): 100 mg via ORAL
  Filled 2021-08-22 (×2): qty 1

## 2021-08-22 MED ORDER — NICOTINE POLACRILEX 2 MG MT GUM
4.0000 mg | CHEWING_GUM | Freq: Once | OROMUCOSAL | Status: AC
  Administered 2021-08-22: 4 mg via ORAL
  Filled 2021-08-22: qty 2

## 2021-08-22 MED ORDER — LORAZEPAM 1 MG PO TABS: 1.0000 mg | ORAL_TABLET | Freq: Four times a day (QID) | ORAL | Status: DC | PRN

## 2021-08-22 MED ORDER — ENSURE ENLIVE PO LIQD
237.0000 mL | Freq: Three times a day (TID) | ORAL | Status: DC
  Administered 2021-08-23 (×2): 237 mL via ORAL
  Filled 2021-08-22 (×5): qty 237

## 2021-08-22 MED ORDER — TRAZODONE HCL 50 MG PO TABS: 50.0000 mg | ORAL_TABLET | Freq: Every evening | ORAL | Status: DC | PRN

## 2021-08-22 MED ORDER — NICOTINE 21 MG/24HR TD PT24
21.0000 mg | MEDICATED_PATCH | Freq: Every day | TRANSDERMAL | Status: DC
  Filled 2021-08-22: qty 1

## 2021-08-22 MED ORDER — ADULT MULTIVITAMIN W/MINERALS CH
1.0000 | ORAL_TABLET | Freq: Every day | ORAL | Status: DC
  Administered 2021-08-22 – 2021-08-23 (×2): 1 via ORAL
  Filled 2021-08-22 (×2): qty 1

## 2021-08-22 MED ORDER — MAGNESIUM HYDROXIDE 400 MG/5ML PO SUSP: 30.0000 mL | Freq: Every day | ORAL | Status: DC | PRN

## 2021-08-22 MED ORDER — TRAZODONE HCL 50 MG PO TABS
25.0000 mg | ORAL_TABLET | Freq: Every day | ORAL | Status: DC
  Administered 2021-08-22: 25 mg via ORAL
  Filled 2021-08-22: qty 1

## 2021-08-22 MED ORDER — ONDANSETRON 4 MG PO TBDP: 4.0000 mg | ORAL_TABLET | Freq: Four times a day (QID) | ORAL | Status: DC | PRN

## 2021-08-22 MED ORDER — LORAZEPAM 1 MG PO TABS
1.0000 mg | ORAL_TABLET | Freq: Once | ORAL | Status: AC
  Administered 2021-08-22: 1 mg via ORAL
  Filled 2021-08-22: qty 1

## 2021-08-22 MED ORDER — HYDROXYZINE HCL 25 MG PO TABS
25.0000 mg | ORAL_TABLET | Freq: Four times a day (QID) | ORAL | Status: DC | PRN
  Administered 2021-08-23: 25 mg via ORAL
  Filled 2021-08-22: qty 1

## 2021-08-22 MED ORDER — ALUM & MAG HYDROXIDE-SIMETH 200-200-20 MG/5ML PO SUSP: 30.0000 mL | ORAL | Status: DC | PRN

## 2021-08-22 MED ORDER — ACETAMINOPHEN 325 MG PO TABS: 650.0000 mg | ORAL_TABLET | Freq: Four times a day (QID) | ORAL | Status: DC | PRN

## 2021-08-22 MED ORDER — LOPERAMIDE HCL 2 MG PO CAPS: 2.0000 mg | ORAL_CAPSULE | ORAL | Status: DC | PRN

## 2021-08-22 NOTE — ED Notes (Signed)
Pt A&O x 4, accompanied by daughter, presents with suicidal ideations, plan to shoot self with gun.  Pt has been drinking alcohol also.  Denies HI or AVH, calm, irritable and cooperative.  Monitoring for safety. ?
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
## 2021-08-23 ENCOUNTER — Inpatient Hospital Stay (HOSPITAL_COMMUNITY)
Admission: AD | Admit: 2021-08-23 | Discharge: 2021-08-31 | DRG: 885 | Disposition: A | Discharge status: Home / Self Care | Payer: BC Managed Care – PPO | Source: Intra-hospital | Attending: Psychiatry | Admitting: Psychiatry

## 2021-08-23 ENCOUNTER — Encounter (HOSPITAL_COMMUNITY): Payer: Self-pay | Admitting: Psychiatry

## 2021-08-23 DIAGNOSIS — Z8616 Personal history of COVID-19: Secondary | ICD-10-CM | POA: Diagnosis not present

## 2021-08-23 DIAGNOSIS — R251 Tremor, unspecified: Secondary | ICD-10-CM | POA: Diagnosis present

## 2021-08-23 DIAGNOSIS — R45851 Suicidal ideations: Secondary | ICD-10-CM | POA: Diagnosis present

## 2021-08-23 DIAGNOSIS — F419 Anxiety disorder, unspecified: Secondary | ICD-10-CM | POA: Diagnosis not present

## 2021-08-23 DIAGNOSIS — F411 Generalized anxiety disorder: Secondary | ICD-10-CM | POA: Diagnosis not present

## 2021-08-23 DIAGNOSIS — F17210 Nicotine dependence, cigarettes, uncomplicated: Secondary | ICD-10-CM | POA: Diagnosis present

## 2021-08-23 DIAGNOSIS — Z818 Family history of other mental and behavioral disorders: Secondary | ICD-10-CM | POA: Diagnosis not present

## 2021-08-23 DIAGNOSIS — Z23 Encounter for immunization: Secondary | ICD-10-CM

## 2021-08-23 DIAGNOSIS — Z91128 Patient's intentional underdosing of medication regimen for other reason: Secondary | ICD-10-CM

## 2021-08-23 DIAGNOSIS — Z9114 Patient's other noncompliance with medication regimen: Secondary | ICD-10-CM | POA: Diagnosis not present

## 2021-08-23 DIAGNOSIS — Z20822 Contact with and (suspected) exposure to covid-19: Secondary | ICD-10-CM | POA: Diagnosis present

## 2021-08-23 DIAGNOSIS — T43226A Underdosing of selective serotonin reuptake inhibitors, initial encounter: Secondary | ICD-10-CM | POA: Diagnosis present

## 2021-08-23 DIAGNOSIS — F17200 Nicotine dependence, unspecified, uncomplicated: Secondary | ICD-10-CM | POA: Diagnosis present

## 2021-08-23 DIAGNOSIS — F332 Major depressive disorder, recurrent severe without psychotic features: Secondary | ICD-10-CM | POA: Diagnosis not present

## 2021-08-23 DIAGNOSIS — R413 Other amnesia: Secondary | ICD-10-CM | POA: Diagnosis present

## 2021-08-23 DIAGNOSIS — Y904 Blood alcohol level of 80-99 mg/100 ml: Secondary | ICD-10-CM | POA: Diagnosis present

## 2021-08-23 DIAGNOSIS — I440 Atrioventricular block, first degree: Secondary | ICD-10-CM | POA: Diagnosis not present

## 2021-08-23 DIAGNOSIS — Z888 Allergy status to other drugs, medicaments and biological substances status: Secondary | ICD-10-CM

## 2021-08-23 DIAGNOSIS — F1020 Alcohol dependence, uncomplicated: Secondary | ICD-10-CM

## 2021-08-23 DIAGNOSIS — F10139 Alcohol abuse with withdrawal, unspecified: Secondary | ICD-10-CM | POA: Diagnosis present

## 2021-08-23 DIAGNOSIS — Z87892 Personal history of anaphylaxis: Secondary | ICD-10-CM

## 2021-08-23 DIAGNOSIS — R4182 Altered mental status, unspecified: Secondary | ICD-10-CM | POA: Diagnosis not present

## 2021-08-23 HISTORY — DX: Major depressive disorder, recurrent severe without psychotic features: F33.2

## 2021-08-23 LAB — POCT URINE DRUG SCREEN - MANUAL ENTRY (I-SCREEN)
POC Amphetamine UR: NOT DETECTED
POC Buprenorphine (BUP): NOT DETECTED
POC Cocaine UR: NOT DETECTED
POC Marijuana UR: POSITIVE — AB
POC Methadone UR: NOT DETECTED
POC Methamphetamine UR: NOT DETECTED
POC Morphine: NOT DETECTED
POC Oxazepam (BZO): POSITIVE — AB
POC Oxycodone UR: NOT DETECTED
POC Secobarbital (BAR): NOT DETECTED

## 2021-08-23 LAB — URINALYSIS, COMPLETE (UACMP) WITH MICROSCOPIC
Bilirubin Urine: NEGATIVE
Glucose, UA: NEGATIVE mg/dL
Hgb urine dipstick: NEGATIVE
Ketones, ur: NEGATIVE mg/dL
Leukocytes,Ua: NEGATIVE
Nitrite: NEGATIVE
Protein, ur: NEGATIVE mg/dL
Specific Gravity, Urine: 1.021 (ref 1.005–1.030)
pH: 5 (ref 5.0–8.0)

## 2021-08-23 MED ORDER — ONDANSETRON 4 MG PO TBDP: 4.0000 mg | ORAL_TABLET | Freq: Four times a day (QID) | ORAL | Status: AC | PRN

## 2021-08-23 MED ORDER — PNEUMOCOCCAL 20-VAL CONJ VACC 0.5 ML IM SUSY
0.5000 mL | PREFILLED_SYRINGE | INTRAMUSCULAR | Status: DC
  Filled 2021-08-23 (×2): qty 0.5

## 2021-08-23 MED ORDER — THIAMINE HCL 100 MG PO TABS
100.0000 mg | ORAL_TABLET | Freq: Every day | ORAL | Status: DC
  Administered 2021-08-24 – 2021-08-31 (×8): 100 mg via ORAL
  Filled 2021-08-23 (×11): qty 1

## 2021-08-23 MED ORDER — ADULT MULTIVITAMIN W/MINERALS CH
1.0000 | ORAL_TABLET | Freq: Every day | ORAL | Status: DC
  Administered 2021-08-24 – 2021-08-31 (×8): 1 via ORAL
  Filled 2021-08-23 (×11): qty 1

## 2021-08-23 MED ORDER — LOPERAMIDE HCL 2 MG PO CAPS: 2.0000 mg | ORAL_CAPSULE | ORAL | Status: AC | PRN

## 2021-08-23 MED ORDER — ENSURE ENLIVE PO LIQD
237.0000 mL | Freq: Two times a day (BID) | ORAL | Status: DC
  Administered 2021-08-24 – 2021-08-29 (×5): 237 mL via ORAL
  Filled 2021-08-23 (×19): qty 237

## 2021-08-23 MED ORDER — ALUM & MAG HYDROXIDE-SIMETH 200-200-20 MG/5ML PO SUSP: 30.0000 mL | ORAL | Status: DC | PRN

## 2021-08-23 MED ORDER — HYDROXYZINE HCL 25 MG PO TABS: 25.0000 mg | ORAL_TABLET | Freq: Three times a day (TID) | ORAL | Status: DC | PRN

## 2021-08-23 MED ORDER — LORAZEPAM 1 MG PO TABS: 1.0000 mg | ORAL_TABLET | Freq: Four times a day (QID) | ORAL | Status: AC | PRN

## 2021-08-23 MED ORDER — ACETAMINOPHEN 325 MG PO TABS: 650.0000 mg | ORAL_TABLET | Freq: Four times a day (QID) | ORAL | Status: DC | PRN

## 2021-08-23 MED ORDER — NICOTINE POLACRILEX 2 MG MT GUM
2.0000 mg | CHEWING_GUM | OROMUCOSAL | Status: DC | PRN
  Administered 2021-08-23 – 2021-08-31 (×16): 2 mg via ORAL
  Filled 2021-08-23 (×6): qty 1

## 2021-08-23 MED ORDER — MAGNESIUM HYDROXIDE 400 MG/5ML PO SUSP: 30.0000 mL | Freq: Every day | ORAL | Status: DC | PRN

## 2021-08-23 MED ORDER — TRAZODONE HCL 50 MG PO TABS
50.0000 mg | ORAL_TABLET | Freq: Every evening | ORAL | Status: DC | PRN
  Administered 2021-08-23: 22:00:00 50 mg via ORAL
  Filled 2021-08-23 (×2): qty 1

## 2021-08-23 NOTE — Discharge Instructions (Signed)
Transfer to [facility] 

## 2021-08-23 NOTE — ED Notes (Signed)
Pt sleeping at present, no distress noted.  Monitoring for safety. 
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
## 2021-08-23 NOTE — ED Provider Notes (Signed)
FBC/OBS ASAP Discharge Summary  Date and Time: 08/23/2021 11:14 AM  Name: Billy Roman  MRN:  [id number]   Discharge Diagnoses:  Final diagnoses:  [name], recurrent severe, without psychosis (Billy Roman)  Suicidal ideation  Anxiety state  Alcohol use disorder, moderate, dependence (HCC)   Subjective: Patient endorses suicidal ideations with no specific plan. Patient denies HI. Patient denies AVH. He reports poor sleep and states that he usually doesn't sleep well. He states that he took trazodone last night and that seemed to have helped as he was able to tune out the noise. He reports a poor appetite. Patient endorses depressive symptoms of sadness, hopelessness, worthlessness, isolating at home after work, hypersomnia, sleeping for 10 hours during the day after work, decreased energy level and anhedonia. Patient states that he was taking sertraline off and on for the past month. He states that he stopped taking the medication because he ran out. Patient denies alcohol withdrawal symptoms currently.   Stay Summary: Billy Roman 61 y.o. male with a history of depresson, anxiety, and alcohol use who presents to [facility] voluntarily with his daughter due to worsening depression and anxiety. Patient has a medical history of vitamin D deficiency, right rotator cuff injury with repair in November 2022, impingement syndrome of left and right shoulders, carpal tunnel syndrome with repair, and lumbar laminectomy. Patient was referred to [facility] by his therapist Steffanie Rainwater due to [place].   Patient reports that he contacted Lorriane Shire today because he has his gun out and was thinking about shooting himself. Patient reports worsening depression and anxiety over the last 6-8 months. He states that last week he overdosed on sertraline, trazodone, and tramadol. He did not seek assistance afterwards. Patient reports that he took 3-4 tablets of each medicine. He acknowledges that this was a suicide  attempt.  Patient was admitted to the [facility] continuous assessment and recommended for inpt tx. Labs obtained: CMP, CBC, lipid, A1C, UA, UDS, Mag, BAL, EKG, TSH and covid. BAL is 82. UDS not collected. Patient was ordered prn ativan for CIWA greater than 10.   Total Time spent with patient: 15 minutes  Past Psychiatric History: history of depression, anxiety, and alcohol use Past Medical History: History reviewed. No pertinent past medical history.  Past Surgical History:  Procedure Laterality Date   RHINOPLASTY     SEPTOPLASTY     SHOULDER ARTHROSCOPY Left    SHOULDER ARTHROSCOPY WITH OPEN ROTATOR CUFF REPAIR AND DISTAL CLAVICLE ACROMINECTOMY Right 02/01/2021   Procedure: RIGHT SHOULDER ARTHROSCOPY WITH MINI OPEN ROTATOR CUFF REPAIR AND DISTAL CLAVICLE EXCISION;  Surgeon: Thornton Park, MD;  Location: [facility] ORS;  Service: Orthopedics;  Laterality: Right;   SPINE SURGERY     lumbar laminectomy   WRIST SURGERY Bilateral    corpal tunnel   Family History: History reviewed. No pertinent family history. Family Psychiatric History: No hx reported.  Social History:  Social History   Substance and Sexual Activity  Alcohol Use No   Comment: rarely     Social History   Substance and Sexual Activity  Drug Use No    Social History   Socioeconomic History   Marital status: Single    Spouse name: Not on file   Number of children: Not on file   Years of education: Not on file   Highest education level: Not on file  Occupational History   Not on file  Tobacco Use   Smoking status: Former    Packs/day: 1.00  Years: 30.00    Pack years: 30.00    Types: Cigarettes    Quit date: 11/21/2020    Years since quitting: 0.7   Smokeless tobacco: Former    Types: Chew    Quit date: 1990  Vaping Use   Vaping Use: Never used  Substance and Sexual Activity   Alcohol use: No    Comment: rarely   Drug use: No   Sexual activity: Not Currently  Other Topics Concern   Not on file   Social History Narrative   Not on file   Social Determinants of Health   Financial Resource Strain: Not on file  Food Insecurity: Not on file  Transportation Needs: Not on file  Physical Activity: Not on file  Stress: Not on file  Social Connections: Not on file   SDOH:  SDOH Screenings   Alcohol Screen: Not on file  Depression (PHQ2-9): Medium Risk   PHQ-2 Score: 18  Financial Resource Strain: Not on file  Food Insecurity: Not on file  Housing: Not on file  Physical Activity: Not on file  Social Connections: Not on file  Stress: Not on file  Tobacco Use: Medium Risk   Smoking Tobacco Use: Former   Smokeless Tobacco Use: Former   Passive Exposure: Not on Pensions consultant Needs: Not on file    Tobacco Cessation:  N/A, patient does not currently use tobacco products  Current Medications:  Current Facility-Administered Medications  Medication Dose Route Frequency Provider Last Rate Last Admin   acetaminophen (TYLENOL) tablet 650 mg  650 mg Oral Q6H PRN Rozetta Nunnery, NP       alum & mag hydroxide-simeth (MAALOX/MYLANTA) 200-200-20 MG/5ML suspension 30 mL  30 mL Oral Q4H PRN Rozetta Nunnery, NP       dicyclomine (BENTYL) tablet 20 mg  20 mg Oral TID PRN Rozetta Nunnery, NP       feeding supplement (ENSURE ENLIVE / ENSURE PLUS) liquid 237 mL  237 mL Oral TID BM Lindon Romp A, NP       hydrOXYzine (ATARAX) tablet 25 mg  25 mg Oral Q6H PRN Lindon Romp A, NP   25 mg at 08/23/21 0908   loperamide (IMODIUM) capsule 2-4 mg  2-4 mg Oral PRN Rozetta Nunnery, NP       LORazepam (ATIVAN) tablet 1 mg  1 mg Oral Q6H PRN Lindon Romp A, NP       magnesium hydroxide (MILK OF MAGNESIA) suspension 30 mL  30 mL Oral Daily PRN Lindon Romp A, NP       multivitamin with minerals tablet 1 tablet  1 tablet Oral Daily Lindon Romp A, NP   1 tablet at 08/23/21 0908   nicotine (NICODERM CQ - dosed in mg/24 hours) patch 21 mg  21 mg Transdermal Q0600 Lindon Romp A, NP       ondansetron  (ZOFRAN-ODT) disintegrating tablet 4 mg  4 mg Oral Q6H PRN Lindon Romp A, NP       thiamine tablet 100 mg  100 mg Oral Daily Lindon Romp A, NP   100 mg at 08/23/21 0908   traZODone (DESYREL) tablet 25 mg  25 mg Oral QHS Lindon Romp A, NP   25 mg at 08/22/21 2216   Current Outpatient Medications  Medication Sig Dispense Refill   benzonatate (TESSALON) 100 MG capsule Take 1 capsule (100 mg total) by mouth 3 (three) times daily as needed for cough. (Patient not taking: Reported on 08/23/2021) 30 capsule  0   COVID-19 At Home Antigen Test KIT Use as directed. 1 kit 0   COVID-19 At Home Test (COVID-19 AT HOME ANTIGEN TEST VI) Place 1 Device into both nostrils as needed.     sertraline (ZOLOFT) 25 MG tablet TAKE 1 TABLET (25 MG TOTAL) BY MOUTH DAILY. (Patient not taking: Reported on 08/23/2021) 30 tablet 0   traZODone (DESYREL) 50 MG tablet Take 0.5 tablets (25 mg total) by mouth at bedtime. (Patient not taking: Reported on 08/23/2021) 15 tablet 2    PTA Medications: (Not in a hospital admission)   Musculoskeletal  Strength & Muscle Tone: within normal limits Gait & Station: normal Patient leans: N/A  Psychiatric Specialty Exam  Presentation  General Appearance: Appropriate for Environment  Eye Contact:Fair  Speech:Clear and Coherent  Speech Volume:Normal  Handedness:No data recorded  Mood and Affect  Mood:Depressed  Affect:Congruent   Thought Process  Thought Processes:Coherent  Descriptions of Associations:Intact  Orientation:Full (Time, Place and Person)  Thought Content:Logical  Diagnosis of Schizophrenia or Schizoaffective disorder in past: No    Hallucinations:Hallucinations: None  Ideas of Reference:None  Suicidal Thoughts:Suicidal Thoughts: Yes, Active SI Active Intent and/or Plan: With Intent; With Plan; With Means to [place]  Homicidal Thoughts:Homicidal Thoughts: No   Sensorium  Memory:Immediate Fair; Remote Fair; Recent  Fair  Judgment:Intact  Insight:Present   Executive Functions  Concentration:Fair  Attention Span:Fair  [place]   Psychomotor Activity  Psychomotor Activity:Psychomotor Activity: Normal   Assets  Assets:Communication Skills; Desire for Improvement; Financial Resources/Insurance; Housing; Leisure Time; Physical Health; Social Support   Sleep  Sleep:Sleep: Fair   Nutritional Assessment (For OBS and FBC admissions only) Has the patient had a weight loss or gain of 10 pounds or more in the last 3 months?: Yes Has the patient had a decrease in food intake/or appetite?: Yes Does the patient have dental problems?: No Does the patient have eating habits or behaviors that may be indicators of an eating disorder including binging or inducing vomiting?: No Has the patient recently lost weight without trying?: 3 Has the patient been eating poorly because of a decreased appetite?: 1 Malnutrition Screening Tool Score: 4 Nutritional Assessment Referrals: Other (comment) (patient recommneded for inpatient psychiatric treatment)    Physical Exam  Physical Exam Constitutional:      Appearance: Normal appearance.  HENT:     Head: Normocephalic.     Nose: Nose normal.  Eyes:     Conjunctiva/sclera: Conjunctivae normal.  Cardiovascular:     Rate and Rhythm: Normal rate.  Pulmonary:     Effort: Pulmonary effort is normal.  Neurological:     Mental Status: He is alert and oriented to person, place, and time.   Review of Systems  Constitutional: Negative.   HENT: Negative.    Eyes: Negative.   Respiratory: Negative.    Cardiovascular: Negative.   Gastrointestinal: Negative.   Genitourinary: Negative.   Skin: Negative.   Neurological: Negative.   Endo/Heme/Allergies: Negative.   Blood pressure 127/83, pulse 66, temperature 98 F (36.7 C), temperature source Oral, resp. rate 16, SpO2 97 %. There is no height or weight on file to  calculate BMI.   Plan Of Care/Follow-up recommendations:  Activity:  as tolerated  Disposition: Patient accepted to [facility] by Carlis Abbott, AC. Patient is voluntary. Admission orders completed. EMTALA completed.   Breeonna Mone L, NP 08/23/2021, 11:14 AM

## 2021-08-23 NOTE — Progress Notes (Signed)
Patient is a a 61 year old male who presented under voluntary admission from the [facility] for worsening depression and SI. Pt reportedly OD last week on his medications (3-4 tablets of each med) in a SA. Pt reports that one of his biggest stressors has been a change in position at his current workplace. Pt reported that the training has been too challenging, as he has noticed having memory challenges. Beside his recent memory loss, pt reported concern about weight loss, and tremors. Pt endorses loss of appetite, eating only one meal a day, and problems sleeping. Pt reports drinking a couple of beers a day. Patient presented with a depressed affect/ anxious mood- calm, cooperative behavior- answered questions logically and coherently during admission interview and assessment. Admission papers completed and signed. Verbal understanding expressed. VS monitored and recorded. Skin assessment performed. Belongings searched and secured in locker. Patient was oriented to unit and schedule.Pt denies SI/HI/AVH at this time. Q 15 min checks initiated for safety.  ?

## 2021-08-23 NOTE — ED Notes (Signed)
Snack given.

## 2021-08-23 NOTE — ED Notes (Signed)
Pt is awake and alert.  Sitting up in bed watching TV.  Pt has flat affect but brightens upon approach.  Complains of tremors in Left hand. Pt continues to be monitored for CIWA and etoh withdrawal. He was encouraged to contribute UDS sample.  Per his request the TV was turned on and he is compliant and calm watching the News.  No other distress noted at this time.  ?

## 2021-08-23 NOTE — ED Notes (Signed)
Pt currently resting quietly.  Breathing even and unlabored.  Staff will continue to monitor for safety.  ?

## 2021-08-23 NOTE — ED Notes (Signed)
Pt resting quietly.   Breathing even and unlabored.  Attempting to call report to [facility].   ? ?

## 2021-08-23 NOTE — Progress Notes (Signed)
?   08/23/21 1945  ?Psych Admission Type (Psych Patients Only)  ?Admission Status Voluntary  ?Psychosocial Assessment  ?Patient Complaints Worrying;Anxiety  ?Eye Contact Fair  ?Facial Expression Anxious;Sad  ?Affect Anxious;Sad  ?Speech Logical/coherent  ?Interaction Assertive  ?Motor Activity Tremors  ?Appearance/Hygiene Improved  ?Behavior Characteristics Cooperative;Anxious  ?Mood Anxious;Sad  ?Thought Process  ?Coherency WDL  ?Content WDL  ?Delusions None reported or observed  ?Perception WDL  ?Hallucination None reported or observed  ?Judgment WDL  ?Confusion WDL  ?Danger to Self  ?Current suicidal ideation? Denies  ?Danger to Others  ?Danger to Others None reported or observed  ? ? ?

## 2021-08-23 NOTE — Tx Team (Signed)
Initial Treatment Plan ?08/23/2021 ?7:00 PM ?Billy Roman ?OQH:[id number] ? ? ? ?PATIENT STRESSORS: ?Health problems   ?Substance abuse   ? ? ?PATIENT STRENGTHS: ?Ability for insight  ?Capable of independent living  ?Communication skills  ?Financial means  ?Motivation for treatment/growth  ?Supportive family/friends  ?Work skills  ? ? ?PATIENT IDENTIFIED PROBLEMS: ?Suicidal ideation  ?  ?"Overwhelmed at work"  ?  ?"I drink to get to sleep"  ?  ?"Memory problems"  ?  ?  ?  ? ?DISCHARGE CRITERIA:  ?Improved stabilization in mood, thinking, and/or behavior ?Need for constant or close observation no longer present ?Reduction of life-threatening or endangering symptoms to within safe limits ?Verbal commitment to aftercare and medication compliance ?Withdrawal symptoms are absent or subacute and managed without 24-hour nursing intervention ? ?PRELIMINARY DISCHARGE PLAN: ?Attend 12-step recovery group ?Outpatient therapy ?Return to previous living arrangement ?Return to previous work or school arrangements ? ?PATIENT/FAMILY INVOLVEMENT: ?This treatment plan has been presented to and reviewed with the patient, Billy Roman,   The patient has been given the opportunity to ask questions and make suggestions. ? ?Waymond Cera, RN ?08/23/2021, 7:00 PM ?

## 2021-08-23 NOTE — BHH Group Notes (Signed)
[place] Group Notes:  (Nursing/MHT/Case Management/Adjunct) ? ?Date:  08/23/2021  ?Time:  8:53 PM ? ?Type of Therapy:  Group Therapy ? ?Participation Level:  Active ? ?Participation Quality:  Attentive ? ?Affect:  Appropriate ? ?Cognitive:  Appropriate ? ?Insight:  Appropriate ? ?Engagement in Group:  Engaged ? ?Modes of Intervention:  Discussion ? ?Summary of Progress/Problems: ? ?Billy Roman ?08/23/2021, 8:53 PM ?

## 2021-08-23 NOTE — Progress Notes (Addendum)
BHH/BMU LCSW Progress Note ?  ?08/23/2021    11:18 AM ? ?Billy Roman  ? ?[id number]  ? ?Type of Contact and Topic:  Psychiatric Bed Placement  ? ?Pt accepted to [facility] 301-1    ? ?Patient meets inpatient criteria per Lindon Romp, NP ? ?The attending provider will be Janine Limbo, MD  ? ?Call report to [phone]   ? ?Drema Halon, RN @ [facility] notified.    ? ?Pt scheduled  to arrive at [facility] @ 1600.  ? ?Mariea Clonts, MSW, LCSW-A  ?11:19 AM 08/23/2021   ?  ? ?  ?  ? ? ? ? ?  ?

## 2021-08-23 NOTE — ED Notes (Signed)
Pt received breakfast 

## 2021-08-23 NOTE — ED Notes (Signed)
Report called to [facility] ;  Verbalized understanding.  ?

## 2021-08-23 NOTE — ED Notes (Signed)
Pt sleeping at present.  No distress noted.  Monitoring for safety. 

## 2021-08-24 ENCOUNTER — Encounter (HOSPITAL_COMMUNITY): Payer: Self-pay

## 2021-08-24 DIAGNOSIS — Z8616 Personal history of COVID-19: Secondary | ICD-10-CM | POA: Insufficient documentation

## 2021-08-24 DIAGNOSIS — F17200 Nicotine dependence, unspecified, uncomplicated: Secondary | ICD-10-CM | POA: Diagnosis present

## 2021-08-24 DIAGNOSIS — Z9889 Other specified postprocedural states: Secondary | ICD-10-CM | POA: Insufficient documentation

## 2021-08-24 DIAGNOSIS — R413 Other amnesia: Secondary | ICD-10-CM | POA: Diagnosis present

## 2021-08-24 DIAGNOSIS — F332 Major depressive disorder, recurrent severe without psychotic features: Secondary | ICD-10-CM

## 2021-08-24 DIAGNOSIS — R251 Tremor, unspecified: Secondary | ICD-10-CM | POA: Diagnosis present

## 2021-08-24 MED ORDER — QUETIAPINE FUMARATE 50 MG PO TABS
50.0000 mg | ORAL_TABLET | Freq: Every evening | ORAL | Status: DC | PRN
  Administered 2021-08-24: 50 mg via ORAL
  Filled 2021-08-24: qty 1

## 2021-08-24 MED ORDER — QUETIAPINE FUMARATE 25 MG PO TABS
25.0000 mg | ORAL_TABLET | Freq: Three times a day (TID) | ORAL | Status: DC | PRN
  Administered 2021-08-27: 25 mg via ORAL
  Filled 2021-08-24: qty 1

## 2021-08-24 MED ORDER — MIRTAZAPINE 15 MG PO TABS
7.5000 mg | ORAL_TABLET | Freq: Every evening | ORAL | Status: DC | PRN
Start: 2021-08-24 — End: 2021-08-25
  Administered 2021-08-24: 7.5 mg via ORAL
  Filled 2021-08-24: qty 1

## 2021-08-24 MED ORDER — VITAMIN D (ERGOCALCIFEROL) 1.25 MG (50000 UNIT) PO CAPS
50000.0000 [IU] | ORAL_CAPSULE | ORAL | Status: DC
  Administered 2021-08-25: 50000 [IU] via ORAL
  Filled 2021-08-24 (×2): qty 1

## 2021-08-24 MED ORDER — PNEUMOCOCCAL VAC POLYVALENT 25 MCG/0.5ML IJ INJ
0.5000 mL | INJECTION | INTRAMUSCULAR | Status: AC
  Administered 2021-08-24: 0.5 mL via INTRAMUSCULAR
  Filled 2021-08-24: qty 0.5

## 2021-08-24 MED ORDER — DESVENLAFAXINE SUCCINATE ER 50 MG PO TB24
50.0000 mg | ORAL_TABLET | Freq: Every day | ORAL | Status: DC
  Administered 2021-08-25 – 2021-08-31 (×7): 50 mg via ORAL
  Filled 2021-08-24 (×11): qty 1
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
## 2021-08-24 NOTE — Plan of Care (Signed)
?  Problem: Education: ?Goal: Knowledge of [facility] Education information/materials will improve ?Outcome: Progressing ?Goal: Emotional status will improve ?Outcome: Progressing ?Goal: Mental status will improve ?Outcome: Progressing ?Goal: Verbalization of understanding the information provided will improve ?Outcome: Progressing ?  ?Problem: Activity: ?Goal: Interest or engagement in activities will improve ?Outcome: Progressing ?Goal: Sleeping patterns will improve ?Outcome: Progressing ?  ?Problem: Coping: ?Goal: Ability to verbalize frustrations and anger appropriately will improve ?Outcome: Progressing ?Goal: Ability to demonstrate self-control will improve ?Outcome: Progressing ?  ?Problem: Health Behavior/Discharge Planning: ?Goal: Identification of resources available to assist in meeting health care needs will improve ?Outcome: Progressing ?Goal: Compliance with treatment plan for underlying cause of condition will improve ?Outcome: Progressing ?  ?Problem: Physical Regulation: ?Goal: Ability to maintain clinical measurements within normal limits will improve ?Outcome: Progressing ?  ?Problem: Safety: ?Goal: Periods of time without injury will increase ?Outcome: Progressing ?  ?Problem: Education: ?Goal: Ability to make informed decisions regarding treatment will improve ?Outcome: Progressing ?  ?Problem: Coping: ?Goal: Coping ability will improve ?Outcome: Progressing ?  ?Problem: Health Behavior/Discharge Planning: ?Goal: Identification of resources available to assist in meeting health care needs will improve ?Outcome: Progressing ?  ?Problem: Medication: ?Goal: Compliance with prescribed medication regimen will improve ?Outcome: Progressing ?  ?Problem: Self-Concept: ?Goal: Ability to disclose and discuss suicidal ideas will improve ?Outcome: Progressing ?Goal: Will verbalize positive feelings about self ?Outcome: Progressing ?  ?Problem: Education: ?Goal: Knowledge of disease or condition  will improve ?Outcome: Progressing ?Goal: Understanding of discharge needs will improve ?Outcome: Progressing ?  ?Problem: Health Behavior/Discharge Planning: ?Goal: Ability to identify changes in lifestyle to reduce recurrence of condition will improve ?Outcome: Progressing ?Goal: Identification of resources available to assist in meeting health care needs will improve ?Outcome: Progressing ?  ?Problem: Physical Regulation: ?Goal: Complications related to the disease process, condition or treatment will be avoided or minimized ?Outcome: Progressing ?  ?Problem: Safety: ?Goal: Ability to remain free from injury will improve ?Outcome: Progressing ?  ?Problem: Education: ?Goal: Utilization of techniques to improve thought processes will improve ?Outcome: Progressing ?Goal: Knowledge of the prescribed therapeutic regimen will improve ?Outcome: Progressing ?  ?Problem: Activity: ?Goal: Interest or engagement in leisure activities will improve ?Outcome: Progressing ?Goal: Imbalance in normal sleep/wake cycle will improve ?Outcome: Progressing ?  ?Problem: Coping: ?Goal: Coping ability will improve ?Outcome: Progressing ?Goal: Will verbalize feelings ?Outcome: Progressing ?  ?Problem: Health Behavior/Discharge Planning: ?Goal: Ability to make decisions will improve ?Outcome: Progressing ?Goal: Compliance with therapeutic regimen will improve ?Outcome: Progressing ?  ?Problem: Role Relationship: ?Goal: Will demonstrate positive changes in social behaviors and relationships ?Outcome: Progressing ?  ?Problem: Safety: ?Goal: Ability to disclose and discuss suicidal ideas will improve ?Outcome: Progressing ?Goal: Ability to identify and utilize support systems that promote safety will improve ?Outcome: Progressing ?  ?Problem: Self-Concept: ?Goal: Will verbalize positive feelings about self ?Outcome: Progressing ?Goal: Level of anxiety will decrease ?Outcome: Progressing ?  ?Problem: Education: ?Goal: Ability to state  activities that reduce stress will improve ?Outcome: Progressing ?  ?Problem: Coping: ?Goal: Ability to identify and develop effective coping behavior will improve ?Outcome: Progressing ?  ?Problem: Self-Concept: ?Goal: Ability to identify factors that promote anxiety will improve ?Outcome: Progressing ?Goal: Level of anxiety will decrease ?Outcome: Progressing ?Goal: Ability to modify response to factors that promote anxiety will improve ?Outcome: Progressing ?  ?

## 2021-08-24 NOTE — BH IP Treatment Plan (Signed)
Interdisciplinary Treatment and Diagnostic Plan Update ? ?08/24/2021 ?Time of Session: 10:15am ?Billy Roman ?MRN: [id number] ? ?Principal Diagnosis: [name], recurrent severe, without psychosis ([place]) ? ?Secondary Diagnoses: Principal Problem: ?  [name], recurrent severe, without psychosis ([place]) ? ? ?Current Medications:  ?Current Facility-Administered Medications  ?Medication Dose Route Frequency Provider Last Rate Last Admin  ? acetaminophen (TYLENOL) tablet 650 mg  650 mg Oral Q6H PRN White, Patrice L, NP      ? alum & mag hydroxide-simeth (MAALOX/MYLANTA) 200-200-20 MG/5ML suspension 30 mL  30 mL Oral Q4H PRN White, Patrice L, NP      ? feeding supplement (ENSURE ENLIVE / ENSURE PLUS) liquid 237 mL  237 mL Oral BID BM Massengill, Nathan, MD      ? hydrOXYzine (ATARAX) tablet 25 mg  25 mg Oral TID PRN White, Patrice L, NP      ? loperamide (IMODIUM) capsule 2-4 mg  2-4 mg Oral PRN White, Patrice L, NP      ? LORazepam (ATIVAN) tablet 1 mg  1 mg Oral Q6H PRN White, Patrice L, NP      ? magnesium hydroxide (MILK OF MAGNESIA) suspension 30 mL  30 mL Oral Daily PRN White, Patrice L, NP      ? multivitamin with minerals tablet 1 tablet  1 tablet Oral Daily White, Patrice L, NP   1 tablet at 08/24/21 9518  ? nicotine polacrilex (NICORETTE) gum 2 mg  2 mg Oral PRN Janine Limbo, MD   2 mg at 08/23/21 2137  ? ondansetron (ZOFRAN-ODT) disintegrating tablet 4 mg  4 mg Oral Q6H PRN White, Patrice L, NP      ? pneumococcal 20-valent conjugate vaccine (PREVNAR 20) injection 0.5 mL  0.5 mL Intramuscular Tomorrow-1000 Massengill, Nathan, MD      ? thiamine tablet 100 mg  100 mg Oral Daily White, Patrice L, NP   100 mg at 08/24/21 8416  ? traZODone (DESYREL) tablet 50 mg  50 mg Oral QHS PRN White, Patrice L, NP   50 mg at 08/23/21 2137  ? ?PTA Medications: ?No medications prior to admission.  ? ? ?Patient Stressors: Health problems   ?Substance abuse   ? ?Patient  Strengths: Ability for insight  ?Capable of independent living  ?Communication skills  ?Financial means  ?Motivation for treatment/growth  ?Supportive family/friends  ?Work skills  ? ?Treatment Modalities: Medication Management, Group therapy, Case management,  ?1 to 1 session with clinician, Psychoeducation, Recreational therapy. ? ? ?Physician Treatment Plan for Primary Diagnosis: [name], recurrent severe, without psychosis ([place]) ?Long Term Goal(s):    ? ?Short Term Goals:   ? ?Medication Management: Evaluate patient's response, side effects, and tolerance of medication regimen. ? ?Therapeutic Interventions: 1 to 1 sessions, Unit Group sessions and Medication administration. ? ?Evaluation of Outcomes: Not Met ? ?Physician Treatment Plan for Secondary Diagnosis: Principal Problem: ?  [name], recurrent severe, without psychosis ([place]) ? ?Long Term Goal(s):    ? ?Short Term Goals:      ? ?Medication Management: Evaluate patient's response, side effects, and tolerance of medication regimen. ? ?Therapeutic Interventions: 1 to 1 sessions, Unit Group sessions and Medication administration. ? ?Evaluation of Outcomes: Not Met ? ? ?RN Treatment Plan for Primary Diagnosis: [name], recurrent severe, without psychosis ([place]) ?Long Term Goal(s): Knowledge of disease and therapeutic regimen to maintain health will improve ? ?Short Term Goals: Ability to verbalize frustration and anger appropriately  will improve, Ability to demonstrate self-control, Ability to participate in decision making will improve, Ability to verbalize feelings will improve, Ability to disclose and discuss suicidal ideas, Ability to identify and develop effective coping behaviors will improve, and Compliance with prescribed medications will improve ? ?Medication Management: RN will administer medications as ordered by provider, will assess and evaluate patient's response and provide  education to patient for prescribed medication. RN will report any adverse and/or side effects to prescribing provider. ? ?Therapeutic Interventions: 1 on 1 counseling sessions, Psychoeducation, Medication administration, Evaluate responses to treatment, Monitor vital signs and CBGs as ordered, Perform/monitor CIWA, COWS, AIMS and Fall Risk screenings as ordered, Perform wound care treatments as ordered. ? ?Evaluation of Outcomes: Not Met ? ? ?LCSW Treatment Plan for Primary Diagnosis: [name], recurrent severe, without psychosis ([place]) ?Long Term Goal(s): Safe transition to appropriate next level of care at discharge, Engage patient in therapeutic group addressing interpersonal concerns. ? ?Short Term Goals: Engage patient in aftercare planning with referrals and resources, Increase social support, Increase ability to appropriately verbalize feelings, Increase emotional regulation, Facilitate acceptance of mental health diagnosis and concerns, Facilitate patient progression through stages of change regarding substance use diagnoses and concerns, Identify triggers associated with mental health/substance abuse issues, and Increase skills for wellness and recovery ? ?Therapeutic Interventions: Assess for all discharge needs, 1 to 1 time with Education officer, museum, Explore available resources and support systems, Assess for adequacy in community support network, Educate family and significant other(s) on suicide prevention, Complete Psychosocial Assessment, Interpersonal group therapy. ? ?Evaluation of Outcomes: Not Met ? ? ?Progress in Treatment: ?Attending groups: Yes. ?Participating in groups: Yes. ?Taking medication as prescribed: Yes. ?Toleration medication: Yes. ?Family/Significant other contact made: No, will contact:  CSW will assess and contact support identified ?Patient understands diagnosis: Yes. ?Discussing patient identified problems/goals with staff: Yes. ?Medical problems stabilized or  resolved: Yes. ?Denies suicidal/homicidal ideation: Yes. ?Issues/concerns per patient self-inventory: No. ?Other: none ? ?New problem(s) identified: No, Describe:  none reported ? ?New Short Term/Long Term Goal(s): ? ?medication stabilization, elimination of SI thoughts, development of comprehensive mental wellness plan.  ? ? ?Patient Goals:  "I want to be able to focus again" ? ?Discharge Plan or Barriers: Patient recently admitted. CSW will continue to follow and assess for appropriate referrals and possible discharge planning.  ? ? ?Reason for Continuation of Hospitalization: Anxiety ?Depression ?Medication stabilization ?Suicidal ideation ? ?Estimated Length of Stay: 3-5 days ? ? ?Scribe for Treatment Team: ?Zachery Conch, LCSW ?08/24/2021 ?11:01 AM ?

## 2021-08-24 NOTE — Group Note (Signed)
Type of Therapy and Topic:  Group Therapy:  Self-Esteem ?  ?Participation Level:  Did Not Attend ? ?Description of Group: ?This group addressed positive self-esteem. Patients were given a worksheet with a blank shield. Patients were asked what a shield is and when it is used. Patients were asked to list, draw, or write protective factors in the their lives on their shields. Patients discussed the words, ideas and drawings that they put on their shield. Patients were encouraged to have a daily reflection of positive characteristics/ protective factors. ? ?Therapeutic Goals ?Patient will verbalize two of their positive qualities ?Patient will demonstrate insight but naming social supports in their lives ?Patient will verbalize their feelings when voicing positive self affirmations and when voicing positive affirmations of others ?Patients will discuss the potential positive impact on their wellness/recovery of focusing on positive traits of self and others. ? ?Summary of Patient Progress: Did not attend ? ? ? ?Mortimer Bair, LCSW, LCAS ?Clincal Social Worker  ?[facility] ? ? ?

## 2021-08-24 NOTE — Progress Notes (Signed)
Alert, oriented and walking around unit. Denies SI/HI at present and denies any A/V hallucinations. Taking meds appropriately as prescribed by MD. Participating in groups this morning and conversating with peers on unit appropriately.  ?
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
## 2021-08-24 NOTE — Group Note (Signed)
Date:  08/24/2021 ?Time:  11:53 AM ? ?Group Topic/Focus:  ?Dimensions of Wellness:   The focus of this group is to introduce the topic of wellness and discuss the role each dimension of wellness plays in total health. ? ? ? ?Participation Level:  Active ? ?Participation Quality:  Appropriate ? ?Affect:  Appropriate ? ?Cognitive:  Appropriate ? ?Insight: Appropriate ? ?Engagement in Group:  Engaged ? ?Modes of Intervention:  Discussion ? ?Additional Comments:  Realizes the need to be holistic in the wellness continuum.  ? ?Jerrye Beavers ?08/24/2021, 11:53 AM ? ?

## 2021-08-24 NOTE — H&P (Signed)
Psychiatric Admission Assessment Adult  Patient Identification: Billy Roman MRN:  [id number] Date of Evaluation:  08/24/2021 Chief Complaint:  [name], recurrent severe, without psychosis ([place]) [F33.2] Principal Diagnosis: [name], recurrent severe, without psychosis ([place]) Diagnosis:  Principal Problem:   [name], recurrent severe, without psychosis ([place]) Active Problems:   Complaints of memory disturbance   Tremor of both hands   Smoker  History of Present Illness: Billy Roman is a 61 YO M with a history of depression who presents with complaints of poor focus, "brain fog", anxiety, depression and suicidal ruminations in the setting of job stress, recent divorce, alcohol use and recent rotator cuff surgery. He has noticed that he has been depressed and had some "brain fog" a bit since covid in 2020. He is worried that there is something wrong with his memory and he wont be able to perform on the job and he wont be able to work anymore. He currently drinks 2 strong drinks on most weekdays and up to 9 on weekends. He is under a lot of stress. He is recently divorced and his 4th grandchild is on the way. He has limited social supports and spends most of his time isolating at home. He was prescribed zoloft but he is not taking it. He denies hallucinations but he has very lucid and vivid dreams. He is worried that he cannot perform at work. He was recently out for rotator cuff surgery and when he returned he was demoted, so he was switched to another department. He found that he just couldn't learn the new material, and his anxiety was very high. Since then he developed SI and has since come here.   Associated Signs/Symptoms: Depression Symptoms:  depressed mood, anhedonia, fatigue, feelings of worthlessness/guilt, difficulty concentrating, hopelessness, impaired memory, recurrent thoughts of death, suicidal thoughts  without plan, anxiety, weight loss, Duration of Depression Symptoms: Greater than two weeks  (Hypo) Manic Symptoms:   NA Anxiety Symptoms:  Excessive Worry, Psychotic Symptoms:   NA PTSD Symptoms: Negative Total Time spent with patient: 1 hour  Past Psychiatric History: Depression, zoloft. Was inpatient at [facility] in [facility]  Is the patient at risk to self? Yes.    Has the patient been a risk to self in the past 6 months? Yes.    Has the patient been a risk to self within the distant past? Yes.    Is the patient a risk to others? No.  Has the patient been a risk to others in the past 6 months? No.  Has the patient been a risk to others within the distant past? No.   Prior Inpatient Therapy:   Prior Outpatient Therapy:    Alcohol Screening: 1. How often do you have a drink containing alcohol?: 4 or more times a week 2. How many drinks containing alcohol do you have on a typical day when you are drinking?: 1 or 2 3. How often do you have six or more drinks on one occasion?: Never AUDIT-C Score: 4 4. How often during the last year have you found that you were not able to stop drinking once you had started?: Never 5. How often during the last year have you failed to do what was normally expected from you because of drinking?: Never 6. How often during the last year have you needed a first drink in the morning to get yourself going after a heavy drinking session?: Never 7. How often during the last year  have you had a feeling of guilt of remorse after drinking?: Never 8. How often during the last year have you been unable to remember what happened the night before because you had been drinking?: Never 9. Have you or someone else been injured as a result of your drinking?: No 10. Has a relative or friend or a doctor or another health worker been concerned about your drinking or suggested you cut down?: No Alcohol Use Disorder Identification Test Final Score (AUDIT): 4 Substance Abuse  History in the last 12 months:  Yes.   Consequences of Substance Abuse: Negative Previous Psychotropic Medications: Yes  Psychological Evaluations: Yes  Past Medical History:  Past Medical History:  Diagnosis Date   [name], recurrent severe, without psychosis ([place]) 08/23/2021    Past Surgical History:  Procedure Laterality Date   RHINOPLASTY     SEPTOPLASTY     SHOULDER ARTHROSCOPY Left    SHOULDER ARTHROSCOPY WITH OPEN ROTATOR CUFF REPAIR AND DISTAL CLAVICLE ACROMINECTOMY Right 02/01/2021   Procedure: RIGHT SHOULDER ARTHROSCOPY WITH MINI OPEN ROTATOR CUFF REPAIR AND DISTAL CLAVICLE EXCISION;  Surgeon: Thornton Park, MD;  Location: [facility] ORS;  Service: Orthopedics;  Laterality: Right;   SPINE SURGERY     lumbar laminectomy   WRIST SURGERY Bilateral    corpal tunnel   Family History: History reviewed. No pertinent family history. Family Psychiatric  History: Adopted. Daughter has depression Tobacco Screening:   Social History:  Social History   Substance and Sexual Activity  Alcohol Use Yes   Alcohol/week: 14.0 standard drinks   Types: 14 Standard drinks or equivalent per week     Social History   Substance and Sexual Activity  Drug Use No    Additional Social History: Marital status: Divorced Divorced, when?: She moved out October 2019, divorced July 2020. What types of issues is patient dealing with in the relationship?: "We don't talk. She doesn't return my messages at this point." Additional relationship information: None noted Are you sexually active?: No What is your sexual orientation?: Straight Does patient have children?: Yes How many children?: 2 How is patient's relationship with their children?: Pt shares he has a good relationship with his 2 daughters and with his grandchildren.                         Allergies:   Allergies  Allergen Reactions   Propyphenazone Anaphylaxis    Felt hot   Lab Results:  Results for  orders placed or performed during the hospital encounter of 08/22/21 (from the past 48 hour(s))  Resp Panel by RT-PCR (Flu A&B, Covid) Nasopharyngeal Swab     Status: None   Collection Time: 08/22/21  9:41 PM   Specimen: Nasopharyngeal Swab; Nasopharyngeal(NP) swabs in vial transport medium  Result Value Ref Range   SARS Coronavirus 2 by RT PCR NEGATIVE NEGATIVE    Comment: (NOTE) SARS-CoV-2 target nucleic acids are NOT DETECTED.  The SARS-CoV-2 RNA is generally detectable in upper respiratory specimens during the acute phase of infection. The lowest concentration of SARS-CoV-2 viral copies this assay can detect is 138 copies/mL. A negative result does not preclude SARS-Cov-2 infection and should not be used as the sole basis for treatment or other patient management decisions. A negative result may occur with  improper specimen collection/handling, submission of specimen other than nasopharyngeal swab, presence of viral mutation(s) within the areas targeted by this assay, and inadequate number of viral copies(<138 copies/mL). A negative result  must be combined with clinical observations, patient history, and epidemiological information. The expected result is Negative.  Fact Sheet for Patients:  EntrepreneurPulse.com.au  Fact Sheet for Healthcare Providers:  IncredibleEmployment.be  This test is no t yet approved or cleared by the [place] FDA and  has been authorized for detection and/or diagnosis of SARS-CoV-2 by FDA under an Emergency Use Authorization (EUA). This EUA will remain  in effect (meaning this test can be used) for the duration of the COVID-19 declaration under Section 564(b)(1) of the Act, 21 U.S.C.section 360bbb-3(b)(1), unless the authorization is terminated  or revoked sooner.       Influenza A by PCR NEGATIVE NEGATIVE   Influenza B by PCR NEGATIVE NEGATIVE    Comment: (NOTE) The Xpert Xpress SARS-CoV-2/FLU/RSV plus  assay is intended as an aid in the diagnosis of influenza from Nasopharyngeal swab specimens and should not be used as a sole basis for treatment. Nasal washings and aspirates are unacceptable for Xpert Xpress SARS-CoV-2/FLU/RSV testing.  Fact Sheet for Patients: EntrepreneurPulse.com.au  Fact Sheet for Healthcare Providers: IncredibleEmployment.be  This test is not yet approved or cleared by the [place] FDA and has been authorized for detection and/or diagnosis of SARS-CoV-2 by FDA under an Emergency Use Authorization (EUA). This EUA will remain in effect (meaning this test can be used) for the duration of the COVID-19 declaration under Section 564(b)(1) of the Act, 21 U.S.C. section 360bbb-3(b)(1), unless the authorization is terminated or revoked.  Performed at [facility], [place] [address] [place], [place] [address]   POC SARS Coronavirus 2 Ag-ED - Nasal Swab     Status: Normal (Preliminary result)   Collection Time: 08/22/21  9:41 PM  Result Value Ref Range   SARS Coronavirus 2 Ag Negative Negative  CBC with Differential/Platelet     Status: None   Collection Time: 08/22/21  9:42 PM  Result Value Ref Range   WBC 6.7 4.0 - 10.5 K/uL   RBC 5.58 4.22 - 5.81 MIL/uL   Hemoglobin 16.2 13.0 - 17.0 g/dL   HCT 48.5 39.0 - 52.0 %   MCV 86.9 80.0 - 100.0 fL   MCH 29.0 26.0 - 34.0 pg   MCHC 33.4 30.0 - 36.0 g/dL   RDW 14.1 11.5 - 15.5 %   Platelets 298 150 - 400 K/uL   nRBC 0.0 0.0 - 0.2 %   Neutrophils Relative % 46 %   Neutro Abs 3.1 1.7 - 7.7 K/uL   Lymphocytes Relative 42 %   Lymphs Abs 2.8 0.7 - 4.0 K/uL   Monocytes Relative 8 %   Monocytes Absolute 0.5 0.1 - 1.0 K/uL   Eosinophils Relative 3 %   Eosinophils Absolute 0.2 0.0 - 0.5 K/uL   Basophils Relative 1 %   Basophils Absolute 0.1 0.0 - 0.1 K/uL   Immature Granulocytes 0 %   Abs Immature Granulocytes 0.02 0.00 - 0.07 K/uL    Comment: Performed at [facility], [address] [place], [place] [address]  Comprehensive metabolic panel     Status: Abnormal   Collection Time: 08/22/21  9:42 PM  Result Value Ref Range   Sodium 136 135 - 145 mmol/L   Potassium 3.6 3.5 - 5.1 mmol/L   Chloride 103 98 - 111 mmol/L   CO2 21 (L) 22 - 32 mmol/L   Glucose, Bld 84 70 - 99 mg/dL    Comment: Glucose reference range applies only to samples taken after fasting for at least  8 hours.   BUN 6 6 - 20 mg/dL   Creatinine, Ser 0.64 0.61 - 1.24 mg/dL   Calcium 9.2 8.9 - 10.3 mg/dL   Total Protein 7.5 6.5 - 8.1 g/dL   Albumin 4.0 3.5 - 5.0 g/dL   AST 23 15 - 41 U/L   ALT 14 0 - 44 U/L   Alkaline Phosphatase 74 38 - 126 U/L   Total Bilirubin 0.5 0.3 - 1.2 mg/dL   GFR, Estimated >60 >60 mL/min    Comment: (NOTE) Calculated using the CKD-EPI Creatinine Equation (2021)    Anion gap 12 5 - 15    Comment: Performed at [facility] [address] [place], [place] [address]  Hemoglobin A1c     Status: None   Collection Time: 08/22/21  9:42 PM  Result Value Ref Range   Hgb A1c MFr Bld 5.2 4.8 - 5.6 %    Comment: (NOTE) Pre diabetes:          5.7%-6.4%  Diabetes:              >6.4%  Glycemic control for   <7.0% adults with diabetes    Mean Plasma Glucose 102.54 mg/dL    Comment: Performed at [facility] [address], [place], [place] [address]  Magnesium     Status: None   Collection Time: 08/22/21  9:42 PM  Result Value Ref Range   Magnesium 2.2 1.7 - 2.4 mg/dL    Comment: Performed at [facility], [place] [address] [place], [place] [address]  Ethanol     Status: Abnormal   Collection Time: 08/22/21  9:42 PM  Result Value Ref Range   Alcohol, Ethyl (B) 82 (H) <10 mg/dL    Comment: (NOTE) Lowest detectable limit for serum alcohol is 10 mg/dL.  For medical purposes only. Performed at [facility], [place] [address] [place], [place] [address]   Lipid panel     Status: None   Collection Time: 08/22/21  9:42 PM  Result Value Ref  Range   Cholesterol 147 0 - 200 mg/dL   Triglycerides 109 <150 mg/dL   HDL 49 >40 mg/dL   Total CHOL/HDL Ratio 3.0 RATIO   VLDL 22 0 - 40 mg/dL   LDL Cholesterol 76 0 - 99 mg/dL    Comment:        Total Cholesterol/HDL:CHD Risk Coronary Heart Disease Risk Table                     Men   Women  1/2 Average Risk   3.4   3.3  Average Risk       5.0   4.4  2 X Average Risk   9.6   7.1  3 X Average Risk  23.4   11.0        Use the calculated Patient Ratio above and the CHD Risk Table to determine the patient's CHD Risk.        ATP III CLASSIFICATION (LDL):  <100     mg/dL   Optimal  100-129  mg/dL   Near or Above                    Optimal  130-159  mg/dL   Borderline  160-189  mg/dL   High  >190     mg/dL   Very High Performed at [facility] [address], [place], [place] [address]  TSH     Status: None   Collection Time: 08/22/21  9:42 PM  Result Value Ref Range   TSH 1.514 0.350 - 4.500 uIU/mL    Comment: Performed by a 3rd Generation assay with a functional sensitivity of <=0.01 uIU/mL. Performed at [facility], [place] [address] [place], [place] [address]   POC SARS Coronavirus 2 Ag     Status: None   Collection Time: 08/22/21  9:53 PM  Result Value Ref Range   SARSCOV2ONAVIRUS 2 AG NEGATIVE NEGATIVE    Comment: (NOTE) SARS-CoV-2 antigen NOT DETECTED.   Negative results are presumptive.  Negative results do not preclude SARS-CoV-2 infection and should not be used as the sole basis for treatment or other patient management decisions, including infection  control decisions, particularly in the presence of clinical signs and  symptoms consistent with COVID-19, or in those who have been in contact with the virus.  Negative results must be combined with clinical observations, patient history, and epidemiological information. The expected result is Negative.  Fact Sheet for Patients: HandmadeRecipes.com.cy  Fact Sheet for Healthcare  Providers: FuneralLife.at  This test is not yet approved or cleared by the [place] FDA and  has been authorized for detection and/or diagnosis of SARS-CoV-2 by FDA under an Emergency Use Authorization (EUA).  This EUA will remain in effect (meaning this test can be used) for the duration of  the COV ID-19 declaration under Section 564(b)(1) of the Act, 21 U.S.C. section 360bbb-3(b)(1), unless the authorization is terminated or revoked sooner.    POCT Urine Drug Screen - ([facility])     Status: Abnormal   Collection Time: 08/23/21 11:38 AM  Result Value Ref Range   POC Amphetamine UR None Detected NONE DETECTED (Cut Off Level 1000 ng/mL)   POC Secobarbital (BAR) None Detected NONE DETECTED (Cut Off Level 300 ng/mL)   POC Buprenorphine (BUP) None Detected NONE DETECTED (Cut Off Level 10 ng/mL)   POC Oxazepam (BZO) Positive (A) NONE DETECTED (Cut Off Level 300 ng/mL)   POC Cocaine UR None Detected NONE DETECTED (Cut Off Level 300 ng/mL)   POC Methamphetamine UR None Detected NONE DETECTED (Cut Off Level 1000 ng/mL)   POC Morphine None Detected NONE DETECTED (Cut Off Level 300 ng/mL)   POC Oxycodone UR None Detected NONE DETECTED (Cut Off Level 100 ng/mL)   POC Methadone UR None Detected NONE DETECTED (Cut Off Level 300 ng/mL)   POC Marijuana UR Positive (A) NONE DETECTED (Cut Off Level 50 ng/mL)  Urinalysis, Complete w Microscopic     Status: Abnormal   Collection Time: 08/23/21  1:33 PM  Result Value Ref Range   Color, Urine AMBER (A) YELLOW    Comment: BIOCHEMICALS MAY BE AFFECTED BY COLOR   APPearance CLOUDY (A) CLEAR   Specific Gravity, Urine 1.021 1.005 - 1.030   pH 5.0 5.0 - 8.0   Glucose, UA NEGATIVE NEGATIVE mg/dL   Hgb urine dipstick NEGATIVE NEGATIVE   Bilirubin Urine NEGATIVE NEGATIVE   Ketones, ur NEGATIVE NEGATIVE mg/dL   Protein, ur NEGATIVE NEGATIVE mg/dL   Nitrite NEGATIVE NEGATIVE   Leukocytes,Ua NEGATIVE NEGATIVE   WBC, UA 6-10 0 - 5  WBC/hpf   Bacteria, UA RARE (A) NONE SEEN   Squamous Epithelial / LPF 0-5 0 - 5   Mucus PRESENT     Comment: Performed at [facility], [place] [address], [place], [place] [address]    Blood Alcohol level:  Lab Results  Component Value  Date   ETH 72 (H) 21/30/8657    Metabolic Disorder Labs:  Lab Results  Component Value Date   HGBA1C 5.2 08/22/2021   MPG 102.54 08/22/2021   No results found for: PROLACTIN Lab Results  Component Value Date   CHOL 147 08/22/2021   TRIG 109 08/22/2021   HDL 49 08/22/2021   CHOLHDL 3.0 08/22/2021   VLDL 22 08/22/2021   LDLCALC 76 08/22/2021   LDLCALC 91 01/15/2021    Current Medications: Current Facility-Administered Medications  Medication Dose Route Frequency Provider Last Rate Last Admin   acetaminophen (TYLENOL) tablet 650 mg  650 mg Oral Q6H PRN White, Patrice L, NP       alum & mag hydroxide-simeth (MAALOX/MYLANTA) 200-200-20 MG/5ML suspension 30 mL  30 mL Oral Q4H PRN White, Patrice L, NP       [START ON 08/25/2021] desvenlafaxine (PRISTIQ) 24 hr tablet 50 mg  50 mg Oral Daily Tamatha Gadbois, Jackie Plum, MD       feeding supplement (ENSURE ENLIVE / ENSURE PLUS) liquid 237 mL  237 mL Oral BID BM Massengill, Ovid Curd, MD   237 mL at 08/24/21 1000   loperamide (IMODIUM) capsule 2-4 mg  2-4 mg Oral PRN White, Patrice L, NP       LORazepam (ATIVAN) tablet 1 mg  1 mg Oral Q6H PRN White, Patrice L, NP       magnesium hydroxide (MILK OF MAGNESIA) suspension 30 mL  30 mL Oral Daily PRN White, Patrice L, NP       mirtazapine (REMERON) tablet 7.5 mg  7.5 mg Oral QHS PRN Xayne Brumbaugh, Jackie Plum, MD       multivitamin with minerals tablet 1 tablet  1 tablet Oral Daily White, Patrice L, NP   1 tablet at 08/24/21 8469   nicotine polacrilex (NICORETTE) gum 2 mg  2 mg Oral PRN Massengill, Ovid Curd, MD   2 mg at 08/24/21 1252   ondansetron (ZOFRAN-ODT) disintegrating tablet 4 mg  4 mg Oral Q6H PRN White, Patrice L, NP       QUEtiapine (SEROQUEL) tablet 25 mg   25 mg Oral TID PRN Maida Sale, MD       QUEtiapine (SEROQUEL) tablet 50 mg  50 mg Oral QHS PRN Haidar Muse, Jackie Plum, MD       thiamine tablet 100 mg  100 mg Oral Daily White, Patrice L, NP   100 mg at 08/24/21 0807   Vitamin D (Ergocalciferol) (DRISDOL) capsule 50,000 Units  50,000 Units Oral Q7 days Maida Sale, MD       PTA Medications: No medications prior to admission.    Musculoskeletal: Strength & Muscle Tone: within normal limits Gait & Station: normal Patient leans: N/A            Psychiatric Specialty Exam:  Presentation  General Appearance: Disheveled  Eye Contact:Fair  Speech:Normal Rate  Speech Volume:Normal  Handedness:Right   Mood and Affect  Mood:Anxious; Depressed  Affect:Constricted; Depressed   Thought Process  Thought Processes:Goal Directed  Duration of Psychotic Symptoms: No data recorded Past Diagnosis of Schizophrenia or Psychoactive disorder: No  Descriptions of Associations:Intact  Orientation:Full (Time, Place and Person)  Thought Content:Logical  Hallucinations:Hallucinations: None  Ideas of Reference:None  Suicidal Thoughts:Suicidal Thoughts: Yes, Passive SI Passive Intent and/or Plan: Without Intent; Without Plan  Homicidal Thoughts:Homicidal Thoughts: No   Sensorium  Memory:Immediate Fair; Recent Fair; Remote [place]  Insight:Fair   Executive Functions  Concentration:Fair  Attention Span:Fair  Recall:Good  Fund of  Knowledge:Good  Language:Good (some word finding difficulties)   Psychomotor Activity  Psychomotor Activity:Psychomotor Activity: Tremor   Assets  Assets:Desire for Improvement; Housing   Sleep  Sleep:Sleep: Poor    Physical Exam: Physical Exam ROS Blood pressure 129/83, pulse 78, temperature 99 F (37.2 C), temperature source Oral, resp. rate 18, height '6\' 1"'$  (1.854 m), weight 101.4 kg, SpO2 98 %. Body mass index is 29.5 kg/m.  Treatment  Plan Summary: Daily contact with patient to assess and evaluate symptoms and progress in treatment and Medication management  Observation Level/Precautions:  Detox 15 minute checks  Laboratory:   likely MRI  Psychotherapy:  1:1, group  Medications:  pristiq, remeron  Consultations:  TBD  Discharge Concerns:  neuro?  Estimated LOS: 7-10  Other:     Physician Treatment Plan for Primary Diagnosis: [name], recurrent severe, without psychosis ([place]) Long Term Goal(s): Improvement in symptoms so as ready for discharge  Short Term Goals: Ability to identify changes in lifestyle to reduce recurrence of condition will improve, Ability to verbalize feelings will improve, Ability to disclose and discuss suicidal ideas, Ability to demonstrate self-control will improve, Ability to identify and develop effective coping behaviors will improve, Ability to maintain clinical measurements within normal limits will improve, Compliance with prescribed medications will improve, and Ability to identify triggers associated with substance abuse/mental health issues will improve  Physician Treatment Plan for Secondary Diagnosis: Principal Problem:   [name], recurrent severe, without psychosis ([place]) Active Problems:   Complaints of memory disturbance   Tremor of both hands   Smoker  Long Term Goal(s): Improvement in symptoms so as ready for discharge  Short Term Goals: Ability to identify changes in lifestyle to reduce recurrence of condition will improve, Ability to verbalize feelings will improve, Ability to disclose and discuss suicidal ideas, Ability to demonstrate self-control will improve, Ability to identify and develop effective coping behaviors will improve, Ability to maintain clinical measurements within normal limits will improve, Compliance with prescribed medications will improve, and Ability to identify triggers associated with substance abuse/mental  health issues will improve  I certify that inpatient services furnished can reasonably be expected to improve the patient's condition.    Maida Sale, MD 3/10/20234:12 PM

## 2021-08-24 NOTE — Progress Notes (Signed)
NUTRITION ASSESSMENT ? ?Pt identified as at risk on the Malnutrition Screen Tool ? ?INTERVENTION: ?1.. Supplements: Ensure Enlive po BID, each supplement provides 350 kcal and 20 grams of protein. ? ?NUTRITION DIAGNOSIS: ?Unintentional weight loss related to sub-optimal intake as evidenced by pt report.  ? ?Goal: ?Pt to meet >/= 90% of their estimated nutrition needs. ? ?Monitor:  ?PO intake ? ?Assessment:  ?Pt admitted for depression and SI.  ?Pt reports poor appetite, typically eats 1 meal a day. Has been drinking beer daily. Reports weight loss. Per weight records, pt has lost 23 lbs since 01/15/21 (9% wt loss x 7 months, insignificant for time frame). However, weight has been trending down since 2017. ?Ensure supplements have been ordered. Receiving MVI and thiamine. ? ?Height: ?Ht Readings from Last 1 Encounters:  ?08/23/21 '6\' 1"'$  (1.854 m)  ? ? ?Weight: ?Wt Readings from Last 1 Encounters:  ?08/23/21 101.4 kg  ? ? ?Weight Hx: ?Wt Readings from Last 10 Encounters:  ?08/23/21 101.4 kg  ?06/04/21 107 kg  ?03/14/21 104.9 kg  ?01/25/21 112.2 kg  ?01/15/21 112.2 kg  ?07/26/19 119.9 kg  ?04/18/16 135.2 kg  ?01/10/16 136.1 kg  ?10/08/15 132 kg  ? ? ?BMI:  Body mass index is 29.5 kg/m?Marland Kitchen ?Pt meets criteria for overweight based on current BMI. ? ?Estimated Nutritional Needs: ?Kcal: 25-30 kcal/kg ?Protein: > 1 gram protein/kg ?Fluid: 1 ml/kcal ? ?Diet Order:  ?Diet Order   ? ? None  ? ?  ? ?Pt is also offered choice of unit snacks mid-morning and mid-afternoon.  ?Pt is eating as desired.  ? ?Lab results and medications reviewed.  ? ?Clayton Bibles, MS, RD, LDN ?Inpatient Clinical Dietitian ?Contact information available via Amion ? ? ?

## 2021-08-24 NOTE — Progress Notes (Signed)
Patient's self inventory sheet, patient has poor sleep, sleep medication not working.  Fair appetite, low energy level, poor concentration.  Rated depression, hopeless and anxiety 8.  Denied withdrawals, tremors, chilling, cramping.  SI, contracts for safety.  Physical problems, tremors, L shoulder cramp.  Physical pain, worst pain #3.   Goal is focus and feelings of being confident and happy.  Plans to discuss my feelings and find coping ideas.  Request to move to separate room for better sleep.  No discharge plans. ? ?

## 2021-08-24 NOTE — Progress Notes (Signed)
?   08/24/21 2310  ?Psych Admission Type (Psych Patients Only)  ?Admission Status Voluntary  ?Psychosocial Assessment  ?Patient Complaints Anxiety  ?Eye Contact Fair  ?Facial Expression Anxious  ?Affect Appropriate to circumstance  ?Speech Logical/coherent  ?Interaction Assertive  ?Motor Activity Tremors  ?Appearance/Hygiene Unremarkable  ?Behavior Characteristics Appropriate to situation  ?Mood Anxious  ?Thought Process  ?Coherency WDL  ?Content WDL  ?Delusions None reported or observed  ?Perception WDL  ?Hallucination None reported or observed  ?Judgment WDL  ?Confusion WDL  ?Danger to Self  ?Current suicidal ideation? Denies  ?Self-Injurious Behavior No self-injurious ideation or behavior indicators observed or expressed   ?Agreement Not to Harm Self Yes  ?Description of Agreement verbal  ?Danger to Others  ?Danger to Others None reported or observed  ? ? ?

## 2021-08-25 DIAGNOSIS — F332 Major depressive disorder, recurrent severe without psychotic features: Secondary | ICD-10-CM | POA: Diagnosis not present

## 2021-08-25 MED ORDER — CHLORDIAZEPOXIDE HCL 5 MG PO CAPS
5.0000 mg | ORAL_CAPSULE | Freq: Three times a day (TID) | ORAL | Status: DC
  Administered 2021-08-25 – 2021-08-28 (×8): 5 mg via ORAL
  Filled 2021-08-25 (×9): qty 1

## 2021-08-25 MED ORDER — CHLORDIAZEPOXIDE HCL 5 MG PO CAPS: 5.0000 mg | ORAL_CAPSULE | Freq: Three times a day (TID) | ORAL | Status: DC

## 2021-08-25 MED ORDER — MIRTAZAPINE 15 MG PO TABS
15.0000 mg | ORAL_TABLET | Freq: Every evening | ORAL | Status: DC | PRN
  Administered 2021-08-25: 15 mg via ORAL
  Filled 2021-08-25: qty 1

## 2021-08-25 MED ORDER — MELATONIN 5 MG PO TABS
5.0000 mg | ORAL_TABLET | Freq: Every day | ORAL | Status: DC
Start: 2021-08-25 — End: 2021-08-31
  Administered 2021-08-25 – 2021-08-30 (×6): 5 mg via ORAL
  Filled 2021-08-25 (×10): qty 1

## 2021-08-25 NOTE — Progress Notes (Signed)
[facility] MD Progress Note  08/25/2021 5:18 PM Billy Roman  MRN:  [id number]  History of Present Illness: Billy Roman is a 61 YO M with a history of depression who presents with complaints of poor focus, "brain fog", anxiety, depression and suicidal ruminations in the setting of job stress, recent divorce, alcohol use and recent rotator cuff surgery. He has noticed that he has been depressed and had some "brain fog" a bit since covid in 2020. He is worried that there is something wrong with his memory and he wont be able to perform on the job and he wont be able to work anymore.  24 hour EMR reviewed. Case reviewed in progression rounds. Per nursing report, the patient is attending groups and is visible on the unit. He is taking his medications but doesn't want the ensure  Subjective:  Billy Roman was seen this afternoon on rounds. He feels "okay" but then admits that he is still very depressed and anxious. He is very worried that he is having difficulties with his recall, focus and learning. He also notes that he is having leg cramping in his legs, restlessness, tremors and lucid dreams at night. He is still having thoughts of death and dying. No hallucinations. He has blurring of his vision that comes and goes. He is willing to have an MRI. He has a lot of unresolved emotions about his employer but no ill will about the people that work there or his bosses. He wishes he would have made different decisions about his retirement plan.   Principal Problem: [name], recurrent severe, without psychosis ([place]) Diagnosis: Principal Problem:   [name], recurrent severe, without psychosis ([place]) Active Problems:   Complaints of memory disturbance   Tremor of both hands   Smoker  Total Time spent with patient: 30 minutes  Past Psychiatric History: Depression, zoloft. [facility] inpatient at [facility] in [facility]  Past Medical History:  Past Medical History:  Diagnosis Date    [name], recurrent severe, without psychosis ([place]) 08/23/2021    Past Surgical History:  Procedure Laterality Date   RHINOPLASTY     SEPTOPLASTY     SHOULDER ARTHROSCOPY Left    SHOULDER ARTHROSCOPY WITH OPEN ROTATOR CUFF REPAIR AND DISTAL CLAVICLE ACROMINECTOMY Right 02/01/2021   Procedure: RIGHT SHOULDER ARTHROSCOPY WITH MINI OPEN ROTATOR CUFF REPAIR AND DISTAL CLAVICLE EXCISION;  Surgeon: Thornton Park, MD;  Location: [facility] ORS;  Service: Orthopedics;  Laterality: Right;   SPINE SURGERY     lumbar laminectomy   WRIST SURGERY Bilateral    corpal tunnel   Family History: History reviewed. No pertinent family history. Family Psychiatric  History: Adopted. Daughter has depression Social History:  Social History   Substance and Sexual Activity  Alcohol Use Yes   Alcohol/week: 14.0 standard drinks   Types: 14 Standard drinks or equivalent per week     Social History   Substance and Sexual Activity  Drug Use No    Social History   Socioeconomic History   Marital status: Single    Spouse name: Not on file   Number of children: Not on file   Years of education: Not on file   Highest education level: Not on file  Occupational History   Not on file  Tobacco Use   Smoking status: Every Day    Packs/day: 1.00    Years: 30.00    Pack years: 30.00    Types: Cigarettes    Last attempt to quit:  11/21/2020    Years since quitting: 0.7   Smokeless tobacco: Former    Types: Chew    Quit date: 1990  Vaping Use   Vaping Use: Never used  Substance and Sexual Activity   Alcohol use: Yes    Alcohol/week: 14.0 standard drinks    Types: 14 Standard drinks or equivalent per week   Drug use: No   Sexual activity: Not Currently  Other Topics Concern   Not on file  Social History Narrative   Not on file   Social Determinants of Health   Financial Resource Strain: Not on file  Food Insecurity: Not on file  Transportation Needs: Not on file  Physical  Activity: Not on file  Stress: Not on file  Social Connections: Not on file   Additional Social History:                         Sleep: Poor  Appetite:  Fair  Current Medications: Current Facility-Administered Medications  Medication Dose Route Frequency Provider Last Rate Last Admin   acetaminophen (TYLENOL) tablet 650 mg  650 mg Oral Q6H PRN White, Patrice L, NP       alum & mag hydroxide-simeth (MAALOX/MYLANTA) 200-200-20 MG/5ML suspension 30 mL  30 mL Oral Q4H PRN White, Patrice L, NP       chlordiazePOXIDE (LIBRIUM) capsule 5 mg  5 mg Oral TID Maida Sale, MD   5 mg at 08/25/21 1702   desvenlafaxine (PRISTIQ) 24 hr tablet 50 mg  50 mg Oral Daily Demere Dotzler, Jackie Plum, MD   50 mg at 08/25/21 9924   feeding supplement (ENSURE ENLIVE / ENSURE PLUS) liquid 237 mL  237 mL Oral BID BM Massengill, Ovid Curd, MD   237 mL at 08/24/21 1000   loperamide (IMODIUM) capsule 2-4 mg  2-4 mg Oral PRN White, Patrice L, NP       LORazepam (ATIVAN) tablet 1 mg  1 mg Oral Q6H PRN White, Patrice L, NP       magnesium hydroxide (MILK OF MAGNESIA) suspension 30 mL  30 mL Oral Daily PRN White, Patrice L, NP       mirtazapine (REMERON) tablet 7.5 mg  7.5 mg Oral QHS PRN Maida Sale, MD   7.5 mg at 08/24/21 2120   multivitamin with minerals tablet 1 tablet  1 tablet Oral Daily White, Patrice L, NP   1 tablet at 08/25/21 0813   nicotine polacrilex (NICORETTE) gum 2 mg  2 mg Oral PRN Massengill, Ovid Curd, MD   2 mg at 08/25/21 1709   ondansetron (ZOFRAN-ODT) disintegrating tablet 4 mg  4 mg Oral Q6H PRN White, Patrice L, NP       QUEtiapine (SEROQUEL) tablet 25 mg  25 mg Oral TID PRN Maida Sale, MD       QUEtiapine (SEROQUEL) tablet 50 mg  50 mg Oral QHS PRN Maida Sale, MD   50 mg at 08/24/21 2117   thiamine tablet 100 mg  100 mg Oral Daily White, Patrice L, NP   100 mg at 08/25/21 0813   Vitamin D (Ergocalciferol) (DRISDOL) capsule 50,000 Units  50,000 Units Oral  Q7 days Maida Sale, MD   50,000 Units at 08/25/21 0813    Lab Results: No results found for this or any previous visit (from the past 48 hour(s)).  Blood Alcohol level:  Lab Results  Component Value Date   ETH 82 (H) 26/83/4196    Metabolic  Disorder Labs: Lab Results  Component Value Date   HGBA1C 5.2 08/22/2021   MPG 102.54 08/22/2021   No results found for: PROLACTIN Lab Results  Component Value Date   CHOL 147 08/22/2021   TRIG 109 08/22/2021   HDL 49 08/22/2021   CHOLHDL 3.0 08/22/2021   VLDL 22 08/22/2021   LDLCALC 76 08/22/2021   LDLCALC 91 01/15/2021    Physical Findings: AIMS:  , ,  ,  ,    CIWA:  CIWA-Ar Total: 1 COWS:     Musculoskeletal: Strength & Muscle Tone: within normal limits Gait & Station: normal Patient leans: N/A  Psychiatric Specialty Exam:  Presentation  General Appearance: Disheveled  Eye Contact:Fair  Speech:Normal Rate  Speech Volume:Normal  Handedness:Right   Mood and Affect  Mood:Depressed; Anxious  Affect:Depressed   Thought Process  Thought Processes:Coherent  Descriptions of Associations:Intact  Orientation:Full (Time, Place and Person)  Thought Content:Logical  History of Schizophrenia/Schizoaffective disorder:No  Duration of Psychotic Symptoms:No data recorded Hallucinations:Hallucinations: None  Ideas of Reference:None  Suicidal Thoughts:Suicidal Thoughts: Yes, Passive SI Passive Intent and/or Plan: Without Intent; Without Plan  Homicidal Thoughts:Homicidal Thoughts: No   Sensorium  Memory:Immediate Fair; Recent Fair; Remote Fair  Judgment:Fair  Insight:Fair   Executive Functions  Concentration:Fair  Attention Span:Fair  [place]   Psychomotor Activity  Psychomotor Activity:Psychomotor Activity: Tremor   Assets  Assets:Housing; Transportation   Sleep  Sleep:Sleep: Poor    Physical Exam: Physical Exam HENT:     Head:  Normocephalic.  Eyes:     Extraocular Movements: Extraocular movements intact.     Conjunctiva/sclera:     Right eye: Right conjunctiva is injected.     Comments: Reddening of lids  Cardiovascular:     Rate and Rhythm: Normal rate.  Pulmonary:     Effort: Pulmonary effort is normal.  Musculoskeletal:     Cervical back: Normal range of motion.  Neurological:     General: No focal deficit present.     Mental Status: He is alert and oriented to person, place, and time.     Cranial Nerves: Cranial nerves 2-12 are intact.     Motor: Tremor present.     Deep Tendon Reflexes:     Reflex Scores:      Bicep reflexes are 0 on the right side and 0 on the left side.      Brachioradialis reflexes are 0 on the right side and 0 on the left side.      Patellar reflexes are 3+ on the right side and 3+ on the left side. Psychiatric:        Attention and Perception: Attention and perception normal.        Mood and Affect: Mood is anxious and depressed. Affect is blunt.        Speech: Speech normal.        Behavior: Behavior is slowed. Behavior is cooperative.        Thought Content: Thought content is not paranoid or delusional. Thought content includes suicidal ideation. Thought content does not include homicidal ideation.        Cognition and Memory: Cognition and memory normal.        Judgment: Judgment normal.   Review of Systems  Constitutional:  Negative for chills and fever.  HENT:  Negative for hearing loss.        Reddened eyes  Eyes:  Positive for blurred vision.  Respiratory:  Negative for cough.   Cardiovascular:  Negative for chest pain and palpitations.  Gastrointestinal:  Negative for nausea and vomiting.  Genitourinary:  Negative for dysuria and urgency.  Musculoskeletal:  Positive for joint pain and myalgias.  Skin:  Positive for itching.  Neurological:  Positive for tremors. Negative for dizziness and headaches.  Psychiatric/Behavioral:  Positive for depression and suicidal  ideas. Negative for hallucinations. The patient is nervous/anxious and has insomnia.   Blood pressure 123/86, pulse 73, temperature 98.5 F (36.9 C), temperature source Oral, resp. rate 18, height '6\' 1"'$  (1.854 m), weight 101.4 kg, SpO2 97 %. Body mass index is 29.5 kg/m.   Treatment Plan Summary: Daily contact with patient to assess and evaluate symptoms and progress in treatment and Medication management  Medications: Mood/anxiety: continue group therapy, milieu therapy, 1:1 evaluation with provider.  Pristiq '50mg'$  PO daily, remeron '15mg'$  PO QHS Melatonin '5mg'$  PO QHS Seroquel '25mg'$  PO TID PRN panic and '50mg'$  PO QHS PRNS sleep Cognitive/Neuro: MRI pending. Increased reflexes at patellar. Decreased B UE (likely due to B UE rotator cuff surgery).  [facility] after completion of detox Neuropsych testing as outpatient Substance Abuse: brief intervention provided abstinence advised.  Librium taper as tolerated. CIWA with PRN lorazepam. Replacing thiamine.  Replacing vitamin D, ensure TID   NRT for smoking cessation Medical: PRNs for pain, constipation, indigestion available.    Maida Sale, MD 08/25/2021, 5:18 PM

## 2021-08-25 NOTE — Group Note (Signed)
LCSW Group Therapy Note ? ?Group Date: 08/25/2021 ?Start Time: 1000 ?End Time: 1100 ? ? ?Type of Therapy and Topic:  Group Therapy: Anger Cues and Responses ? ?Participation Level:  Did Not Attend ? ? ?Description of Group:   ?In this group, patients learned how to recognize the physical, cognitive, emotional, and behavioral responses they have to anger-provoking situations.  They identified a recent time they became angry and how they reacted.  They analyzed how their reaction was possibly beneficial and how it was possibly unhelpful.  The group discussed a variety of healthier coping skills that could help with such a situation in the future.  Focus was placed on how helpful it is to recognize the underlying emotions to our anger, because working on those can lead to a more permanent solution as well as our ability to focus on the important rather than the urgent. ? ?Therapeutic Goals: ?Patients will remember their last incident of anger and how they felt emotionally and physically, what their thoughts were at the time, and how they behaved. ?Patients will identify how their behavior at that time worked for them, as well as how it worked against them. ?Patients will explore possible new behaviors to use in future anger situations. ?Patients will learn that anger itself is normal and cannot be eliminated, and that healthier reactions can assist with resolving conflict rather than worsening situations. ? ?Summary of Patient Progress:  The patient was invited to group, did not attend. ? ?Therapeutic Modalities:   ?Cognitive Behavioral Therapy ? ? ? ?Maretta Los, LCSWA ?08/25/2021  11:18 AM   ? ?

## 2021-08-25 NOTE — Progress Notes (Signed)
?   08/25/21 2246  ?Psych Admission Type (Psych Patients Only)  ?Admission Status Voluntary  ?Psychosocial Assessment  ?Patient Complaints Insomnia  ?Eye Contact Fair  ?Facial Expression Animated  ?Affect Appropriate to circumstance  ?Speech Logical/coherent  ?Interaction Assertive  ?Motor Activity Tremors  ?Appearance/Hygiene Unremarkable  ?Behavior Characteristics Appropriate to situation  ?Mood Pleasant  ?Thought Process  ?Coherency WDL  ?Content WDL  ?Delusions None reported or observed  ?Perception WDL  ?Hallucination None reported or observed  ?Judgment WDL  ?Confusion WDL  ?Danger to Self  ?Current suicidal ideation? Denies  ?Self-Injurious Behavior No self-injurious ideation or behavior indicators observed or expressed   ?Agreement Not to Harm Self Yes  ?Description of Agreement verbal  ?Danger to Others  ?Danger to Others None reported or observed  ? ? ?

## 2021-08-25 NOTE — Progress Notes (Signed)
D. Pt presented with a sad affect/ depressed mood, complained of "always feeling tired." Pt stayed in bed for morning groups, but attended group after lunch. Per pt's self inventory, pt rated his depression, hopelessness and anxiety a 6/7/3, respectively. Pt reported some mild withdrawal symptoms (tremors, chilling), but refused Librium for relief. Pt currently denies SI/HI and AVH  ?A. Labs and vitals monitored. Pt supported emotionally and encouraged to express concerns and ask questions.   ?R. Pt remains safe with 15 minute checks. Will continue POC. ? ?  ?

## 2021-08-25 NOTE — [facility] Group Notes (Signed)
.  Psychoeducational Group Note ? ?Date: 08/25/2021 ?Time: 0900-1000 ? ? ? ?Goal Setting  ? ?Purpose of Group: This group helps to provide patients with the steps of setting Roman goal that is specific, measurable, attainable, realistic and time specific. Roman discussion on how we keep ourselves stuck with negative self talk. Homework given for Patients to write 30 positive attributes about themselves. ? ? ? ?Participation Level: did not attend ?Billy Roman ?

## 2021-08-25 NOTE — [facility] Group Notes (Signed)
Psychoeducational Group Note ? ? ? ?Date:08/25/21 ?Time: 1300-1400 ? ? ? ?Purpose of Group: . The group focus' on teaching patients on how to identify their needs and their Life Skills:  A group where two lists are made. What people need and what are things that we do that are unhealthy. The lists are developed by the patients and it is explained that we often do the actions that are not healthy to get our list of needs met. ? ?Goal:: to develop the coping skills needed to get their needs met ? ?Participation Level:  Active ? ?Participation Quality:  Appropriate ? ?Affect:  Appropriate ? ?Cognitive:  Oriented ? ?Insight:  Improving ? ?Engagement in Group:  Engaged ? ?Additional Comments: Did not attend ? ?Bryson Dames A ? ?

## 2021-08-25 NOTE — Progress Notes (Signed)
Psychoeducational Group Note ? ?Date:  08/25/2021 ?Time:  2034 ? ?Group Topic/Focus:  ?Wrap-Up Group:   The focus of this group is to help patients review their daily goal of treatment and discuss progress on daily workbooks. ? ?Participation Level: Did Not Attend ? ?Participation Quality:  Not Applicable ? ?Affect:  Not Applicable ? ?Cognitive:  Not Applicable ? ?Insight:  Not Applicable ? ?Engagement in Group: Not Applicable ? ?Additional Comments:  The patient did not attend group. ? ?Archie Balboa S ?08/25/2021, 8:34 PM  ?

## 2021-08-26 DIAGNOSIS — F332 Major depressive disorder, recurrent severe without psychotic features: Secondary | ICD-10-CM | POA: Diagnosis not present

## 2021-08-26 NOTE — [facility] Group Notes (Signed)
Adult Psychoeducational Group Not ?Date:  08/26/2021 ?Time:  1572-6203 ?Group Topic/Focus: PROGRESSIVE RELAXATION. A group where deep breathing is taught and tensing and relaxation muscle groups is used. Imagery is used as well.  Pts are asked to imagine 3 pillars that hold them up when they are not able to hold themselves up and to share that with the group. ? ?Participation Level:  did not attend ? ?Billy Roman A ? ?

## 2021-08-26 NOTE — Group Note (Signed)
Date:  08/26/2021 ?Time:  11:11 AM ? ?Group Topic/Focus:  ?Orientation:   The focus of this group is to educate the patient on the purpose and policies of crisis stabilization and provide a format to answer questions about their admission.  The group details unit policies and expectations of patients while admitted. ? ? ? ?Participation Level:  Did Not Attend ? ?Participation Quality:  ? ?Affect:   ? ?Cognitive:   ? ?Insight:  ? ?Engagement in Group:   ? ?Modes of Intervention:   ? ?Additional Comments:   ? ?Jerrye Beavers ?08/26/2021, 11:11 AM ? ?

## 2021-08-26 NOTE — Plan of Care (Signed)
Nurse discussed coping skills with patient.  

## 2021-08-26 NOTE — Progress Notes (Signed)
D:   Patient's self inventory, patient has fair sleep, sleep medication helpful.  Fair appetite, low energy level, poor concentration.   Rated depression and hopeless 7, anxiety high.  Denied withdrawals, then checked tremors, diarrhea, chilling, cravings.  SI, contracts for safety.  Physical problems, legs sore, calf spasms.  Physical pain, worst pain #4 in past 24 hours.  Goal is positive and focused in my direction.  Plans to discuss problems and seek solution.  Will discuss how to reframe attitude and gain confidence ?A:  Medications administered per MD orders.  Emotional support and encouragement given patient. ?R:  Denied SI wile talking to nurse this morning, contracts for safety.  Denied HI.  Denied A/V hallucinations.  Safety maintained with 15 minute checks. ? ? ?

## 2021-08-26 NOTE — Progress Notes (Addendum)
BRANDON IS SCHEDULED TO GO TO [facility] FOR MR BRAIN Monday March 13 NOON. ?PLEASE CALL [phone] AND SAY THAT Wanya WILL BE COMING TO [facility] FOR MR BRAIN PER JACKIE. ? ?Kennyth Lose (radiology) phone [phone] said patient is to come to 2nd floor, eduction area.  The actual MR is done in the big truck in front of [facility].   Since Onaje is already a patient at [facility], he may not have to go through the admission process. ? ?Mikaeel said he DOES NOT HAVE ANY METAL, PACEMAKER, ETC IN HIM. ? ?PATIENT ACTUALLY HAS TO BE AT [facility] AT 11:30 AM FOR NOON APPOINTMENT.     Per Kennyth Lose ? ?

## 2021-08-26 NOTE — Progress Notes (Signed)
[facility] MD Progress Note  08/26/2021 10:37 AM Billy Roman  MRN:  [id number]  Subjective: Billy Roman reports, "I did not get any sleep or rest last night because they brought in a roommate. I feel so tired this morning as a result. I went to the morning group, was so tired to sit through the group sessions, so I came back to my room to take a morning nap. My mood is okay, I'm irritated for feeling so tired this morning & you guys will not allow me any rest".  History of Present Illness: Billy Roman is a 61 YO M with a history of depression who presents with complaints of poor focus, "brain fog", anxiety, depression and suicidal ruminations in the setting of job stress, recent divorce, alcohol use and recent rotator cuff surgery. He has noticed that he has been depressed and had some "brain fog" a bit since covid in 2020. He is worried that there is something wrong with his memory and he wont be able to perform on the job and he wont be able to work anymore.  24 hour EMR reviewed. Case reviewed in progression rounds. Per nursing report, the patient is attending groups and is visible on the unit. He is taking his medications but doesn't want the ensure   Billy Roman is seen this morning. Chart reviewed. The chart findings discussed with the treatment team. He presents alert but irritated. He says he feels "okay" but admits feeling irritated because he did not sleep well last night. He says that they brought in a roommate in his room last night & that kind of disturbed his sleep. He says to make matters worse, the clock was moved forward last night night, as a result, he missed one hour of sleep. He says other than feeling irritated, he is doing okay, mood wise. He rates his depression #7 & anxiety #7. He currently denies any SIHI, AVH, delusional thoughts or paranoia. He does not appear to be responding to any internal stimuli. Billy Roman continues on his plan of care as already in progress. Denies any side  effects.  Principal Problem: [name], recurrent severe, without psychosis ([place])  Diagnosis: Principal Problem:   [name], recurrent severe, without psychosis ([place]) Active Problems:   Complaints of memory disturbance   Tremor of both hands   Smoker  Total Time spent with patient:  25 minutes  Past Psychiatric History: Depression, zoloft. Was inpatient at [facility] in [facility]  Past Medical History:  Past Medical History:  Diagnosis Date   [name], recurrent severe, without psychosis ([place]) 08/23/2021    Past Surgical History:  Procedure Laterality Date   RHINOPLASTY     SEPTOPLASTY     SHOULDER ARTHROSCOPY Left    SHOULDER ARTHROSCOPY WITH OPEN ROTATOR CUFF REPAIR AND DISTAL CLAVICLE ACROMINECTOMY Right 02/01/2021   Procedure: RIGHT SHOULDER ARTHROSCOPY WITH MINI OPEN ROTATOR CUFF REPAIR AND DISTAL CLAVICLE EXCISION;  Surgeon: Thornton Park, MD;  Location: [facility] ORS;  Service: Orthopedics;  Laterality: Right;   SPINE SURGERY     lumbar laminectomy   WRIST SURGERY Bilateral    corpal tunnel   Family History: History reviewed. No pertinent family history. Family Psychiatric  History: Adopted. Daughter has depression Social History:  Social History   Substance and Sexual Activity  Alcohol Use Yes   Alcohol/week: 14.0 standard drinks   Types: 14 Standard drinks or equivalent per week     Social History   Substance and Sexual Activity  Drug Use No    Social History   Socioeconomic History   Marital status: Single    Spouse name: Not on file   Number of children: Not on file   Years of education: Not on file   Highest education level: Not on file  Occupational History   Not on file  Tobacco Use   Smoking status: Every Day    Packs/day: 1.00    Years: 30.00    Pack years: 30.00    Types: Cigarettes    Last attempt to quit: 11/21/2020    Years since quitting: 0.7   Smokeless tobacco: Former    Types:  Chew    Quit date: 1990  Vaping Use   Vaping Use: Never used  Substance and Sexual Activity   Alcohol use: Yes    Alcohol/week: 14.0 standard drinks    Types: 14 Standard drinks or equivalent per week   Drug use: No   Sexual activity: Not Currently  Other Topics Concern   Not on file  Social History Narrative   Not on file   Social Determinants of Health   Financial Resource Strain: Not on file  Food Insecurity: Not on file  Transportation Needs: Not on file  Physical Activity: Not on file  Stress: Not on file  Social Connections: Not on file   Additional Social History:   Sleep: Fair  Appetite:  Fair  Current Medications: Current Facility-Administered Medications  Medication Dose Route Frequency Provider Last Rate Last Admin   acetaminophen (TYLENOL) tablet 650 mg  650 mg Oral Q6H PRN White, Patrice L, NP       alum & mag hydroxide-simeth (MAALOX/MYLANTA) 200-200-20 MG/5ML suspension 30 mL  30 mL Oral Q4H PRN White, Patrice L, NP       chlordiazePOXIDE (LIBRIUM) capsule 5 mg  5 mg Oral TID Maida Sale, MD   5 mg at 08/26/21 0728   desvenlafaxine (PRISTIQ) 24 hr tablet 50 mg  50 mg Oral Daily Hill, Jackie Plum, MD   50 mg at 08/26/21 3500   feeding supplement (ENSURE ENLIVE / ENSURE PLUS) liquid 237 mL  237 mL Oral BID BM Massengill, Ovid Curd, MD   237 mL at 08/24/21 1000   loperamide (IMODIUM) capsule 2-4 mg  2-4 mg Oral PRN White, Patrice L, NP       LORazepam (ATIVAN) tablet 1 mg  1 mg Oral Q6H PRN White, Patrice L, NP       magnesium hydroxide (MILK OF MAGNESIA) suspension 30 mL  30 mL Oral Daily PRN White, Patrice L, NP       melatonin tablet 5 mg  5 mg Oral QHS Hill, Jackie Plum, MD   5 mg at 08/25/21 2138   mirtazapine (REMERON) tablet 15 mg  15 mg Oral QHS PRN Maida Sale, MD   15 mg at 08/25/21 2138   multivitamin with minerals tablet 1 tablet  1 tablet Oral Daily White, Patrice L, NP   1 tablet at 08/26/21 9381   nicotine polacrilex  (NICORETTE) gum 2 mg  2 mg Oral PRN Massengill, Ovid Curd, MD   2 mg at 08/26/21 0728   ondansetron (ZOFRAN-ODT) disintegrating tablet 4 mg  4 mg Oral Q6H PRN White, Patrice L, NP       QUEtiapine (SEROQUEL) tablet 25 mg  25 mg Oral TID PRN Maida Sale, MD       QUEtiapine (SEROQUEL) tablet 50 mg  50 mg Oral QHS PRN Hill, Jackie Plum, MD  50 mg at 08/24/21 2117   thiamine tablet 100 mg  100 mg Oral Daily White, Patrice L, NP   100 mg at 08/26/21 0454   Vitamin D (Ergocalciferol) (DRISDOL) capsule 50,000 Units  50,000 Units Oral Q7 days Maida Sale, MD   50,000 Units at 08/25/21 0813    Lab Results: No results found for this or any previous visit (from the past 48 hour(s)).  Blood Alcohol level:  Lab Results  Component Value Date   ETH 82 (H) 09/81/1914    Metabolic Disorder Labs: Lab Results  Component Value Date   HGBA1C 5.2 08/22/2021   MPG 102.54 08/22/2021   No results found for: PROLACTIN Lab Results  Component Value Date   CHOL 147 08/22/2021   TRIG 109 08/22/2021   HDL 49 08/22/2021   CHOLHDL 3.0 08/22/2021   VLDL 22 08/22/2021   LDLCALC 76 08/22/2021   LDLCALC 91 01/15/2021    Physical Findings: AIMS:  , ,  ,  ,    CIWA:  CIWA-Ar Total: 0 COWS:     Musculoskeletal: Strength & Muscle Tone: within normal limits Gait & Station: normal Patient leans: N/A  Psychiatric Specialty Exam:  Presentation  General Appearance: Disheveled  Eye Contact:Fair  Speech:Normal Rate  Speech Volume:Normal  Handedness:Right   Mood and Affect  Mood:Depressed; Anxious  Affect:Depressed   Thought Process  Thought Processes:Coherent  Descriptions of Associations:Intact  Orientation:Full (Time, Place and Person)  Thought Content:Logical  History of Schizophrenia/Schizoaffective disorder:No  Duration of Psychotic Symptoms:No data recorded Hallucinations:Hallucinations: None  Ideas of Reference:None  Suicidal Thoughts:Suicidal Thoughts:  No SI Active Intent and/or Plan: Without Intent; Without Means to Carry Out; Without Access to Means; Without Plan SI Passive Intent and/or Plan: Without Intent; Without Plan  Homicidal Thoughts:Homicidal Thoughts: No   Sensorium  Memory:Immediate Fair; Recent Fair; Remote Fair  Judgment:Fair  Insight:Fair   Executive Functions  Concentration:Fair  Attention Span:Fair  [place]   Psychomotor Activity  Psychomotor Activity:Psychomotor Activity: Tremor  Assets  Assets:Housing; Transportation  Sleep  Sleep:Sleep: Fair Number of Hours of Sleep: 5.5   Physical Exam: Physical Exam HENT:     Head: Normocephalic.  Eyes:     Extraocular Movements: Extraocular movements intact.     Conjunctiva/sclera:     Right eye: Right conjunctiva is injected.     Comments: Reddening of lids  Cardiovascular:     Rate and Rhythm: Normal rate.  Pulmonary:     Effort: Pulmonary effort is normal.  Musculoskeletal:     Cervical back: Normal range of motion.  Neurological:     General: No focal deficit present.     Mental Status: He is alert and oriented to person, place, and time.     Cranial Nerves: Cranial nerves 2-12 are intact.     Motor: Tremor present.     Deep Tendon Reflexes:     Reflex Scores:      Bicep reflexes are 0 on the right side and 0 on the left side.      Brachioradialis reflexes are 0 on the right side and 0 on the left side.      Patellar reflexes are 3+ on the right side and 3+ on the left side. Psychiatric:        Attention and Perception: Attention and perception normal.        Mood and Affect: Mood is anxious and depressed. Affect is blunt.        Speech:  Speech normal.        Behavior: Behavior is slowed. Behavior is cooperative.        Thought Content: Thought content is not paranoid or delusional. Thought content includes suicidal ideation. Thought content does not include homicidal ideation.         Cognition and Memory: Cognition and memory normal.        Judgment: Judgment normal.   Review of Systems  Constitutional:  Negative for chills and fever.  HENT:  Negative for hearing loss.        Reddened eyes  Respiratory:  Negative for cough.   Cardiovascular:  Negative for chest pain and palpitations.  Gastrointestinal:  Negative for nausea and vomiting.  Genitourinary:  Negative for dysuria and urgency.  Musculoskeletal:  Negative for joint pain and myalgias.  Skin:  Negative for itching.  Neurological:  Negative for dizziness, tremors and headaches.  Psychiatric/Behavioral:  Positive for depression and substance abuse. Negative for hallucinations. The patient is nervous/anxious and has insomnia.   Blood pressure 125/89, pulse 72, temperature 98.1 F (36.7 C), temperature source Oral, resp. rate 18, height '6\' 1"'$  (1.854 m), weight 101.4 kg, SpO2 98 %. Body mass index is 29.5 kg/m.  Treatment Plan Summary: Daily contact with patient to assess and evaluate symptoms and progress in treatment and Medication management  Medications: Mood/anxiety: continue group therapy, milieu therapy, 1:1 evaluation with provider.  Pristiq 50 mg PO daily. Remeron '15mg'$  PO QHS Melatonin 5 mg PO QHS Seroquel '25mg'$  PO TID PRN panic and '50mg'$  PO QHS PRNS sleep Cognitive/Neuro: MRI pending. Increased reflexes at patellar. Decreased B UE (likely due to B UE rotator cuff surgery).  [facility] after completion of detox Neuropsych testing as outpatient Substance Abuse: brief intervention provided abstinence advised.  Librium taper as tolerated. CIWA with PRN lorazepam. Replacing thiamine.  Replacing vitamin D, ensure TID   NRT for smoking cessation Medical: PRNs for pain, constipation, indigestion available.    Lindell Spar, NP, pmhnp, fnp-bc 08/26/2021, 10:37 AM Patient ID: Billy Roman, male   DOB: [birth year], 61 y.o.   MRN: [id number]

## 2021-08-26 NOTE — Progress Notes (Signed)
The patient rated his day as a 6 out of 10 since he spoke with more people today. His goal for tomorrow is to work on his discharge plans.  ?

## 2021-08-26 NOTE — [facility] Group Notes (Addendum)
Adult Psychoeducational Group  ?Date:  08/26/2021 ?Time:  1478-2956 ?Group Topic/Focus: Continuation of the group from Saturday. Looking at the lists that were created and talking about what needs to be done with the homework of 30 positives about themselves.  ?                                   Talking about taking their power back and helping themselves to develop a positive self esteem. ?     ?Participation Quality:  Appropriate ? ?Affect:  Appropriate ? ?Cognitive:  Oriented ? ?Insight: Improving ? ?Engagement in Group:  Engaged ? ?Modes of Intervention:  Activity, Discussion, Education, and Support ? ?Additional Comments:  Pt. Attended the group. Rates his energy at a6/10.  ? ?Bryson Dames A ? ?

## 2021-08-26 NOTE — [facility] Group Notes (Signed)
LCSW Group Therapy Note ? ?No social work group was held today due to a scheduling conflict.  Another licensed group was held by an Therapist, sports. ? ?Selmer Dominion, LCSW ?04/07/2021 ?1:50 PM  ?  ? ?

## 2021-08-27 ENCOUNTER — Inpatient Hospital Stay (HOSPITAL_COMMUNITY)
Admission: AD | Admit: 2021-08-27 | Discharge: 2021-08-27 | Disposition: A | Discharge status: Home / Self Care | Payer: BC Managed Care – PPO | Source: Intra-hospital | Attending: Psychiatry | Admitting: Psychiatry

## 2021-08-27 DIAGNOSIS — F332 Major depressive disorder, recurrent severe without psychotic features: Secondary | ICD-10-CM | POA: Diagnosis not present

## 2021-08-27 IMAGING — MR MR HEAD WO/W CM
15 series · 48 of 48 positions shown · IV contrast (gadavist)
Comparison: None.

CLINICAL DATA: Mental status change, unknown cause tremors,
cramping, forgetfulness

EXAM:
MRI HEAD WITHOUT AND WITH CONTRAST
TECHNIQUE: Multiplanar, multiecho pulse sequences of the brain and surrounding
structures were obtained without and with intravenous contrast.
CONTRAST:  10mL GADAVIST GADOBUTROL 1 MMOL/ML IV SOLN

[Series 5: DWI · axial · 3.0mm · 1.36mm/px · z∈[-10,+127]mm · 6 of 96 slices shown (1 of 2)]
[im 1/96]
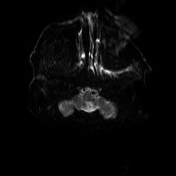
[im 20/96]
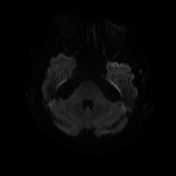
[im 39/96]
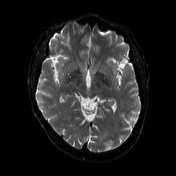
[im 58/96]
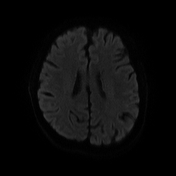
[im 77/96]
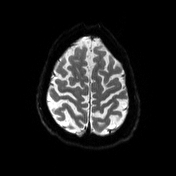
[im 96/96]
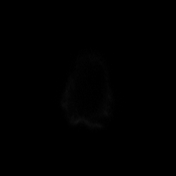

[Series 6: DWI · axial · 3.0mm · 1.36mm/px · z∈[-10,+127]mm · 3 of 48 slices shown (2 of 2)]
[im 1/48]
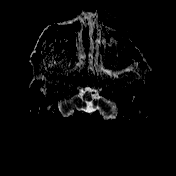
[im 24/48]
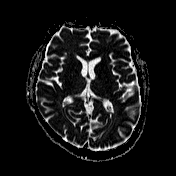
[im 48/48]
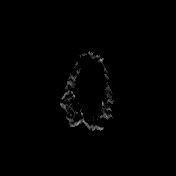

[Series 7: T1 · sagittal · 5.0mm · 0.75mm/px · 1 of 26 slices shown (1 of 4)]
[im 1/26]
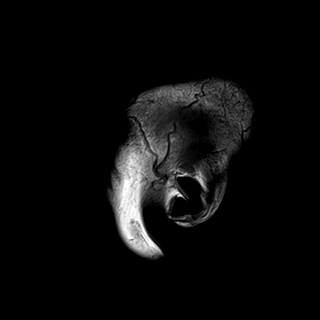

[Series 8: T2 · axial · 5.0mm · 0.62mm/px · 1 of 24 slices shown]
[im 1/24]
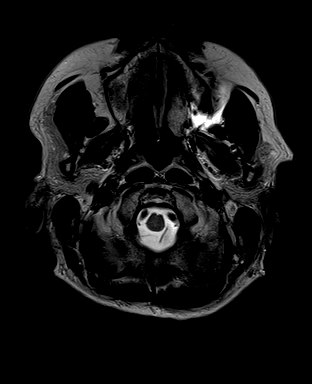

[Series 9: swi_images · axial · 3.0mm · 0.75mm/px · z∈[-59,+152]mm · 4 of 72 slices shown]
[im 1/72]
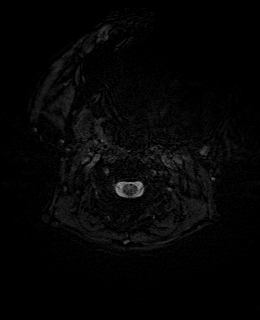
[im 24/72]
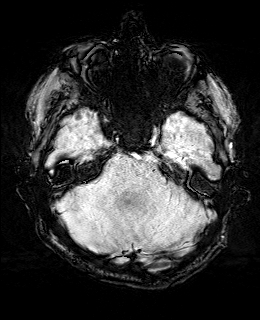
[im 48/72]
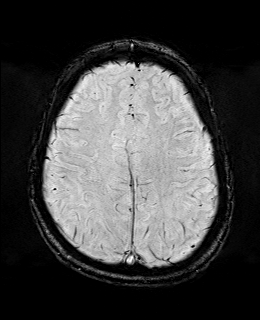
[im 72/72]
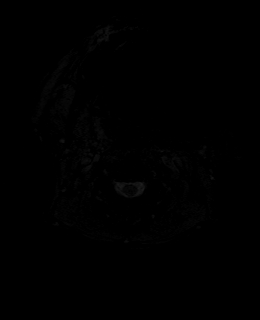

[Series 11: FLAIR · axial · 3.0mm · 0.75mm/px · z∈[-30,+122]mm · 3 of 52 slices shown]
[im 1/52]
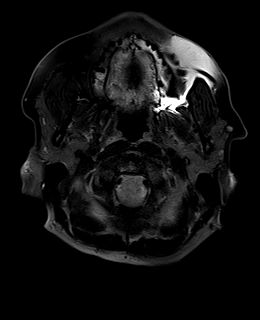
[im 26/52]
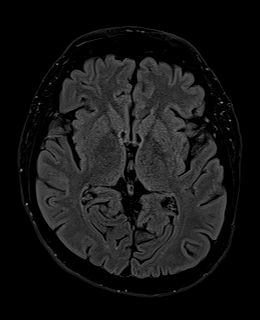
[im 52/52]
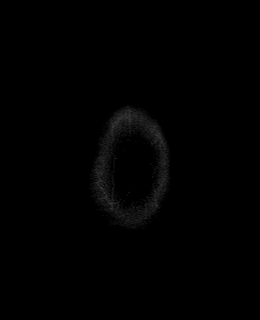

[Series 12: T1 · axial · 1.0mm · 0.94mm/px · z∈[-17,+124]mm · 8 of 144 slices shown (2 of 4)]
[im 1/144]
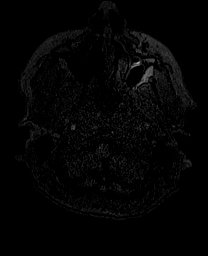
[im 21/144]
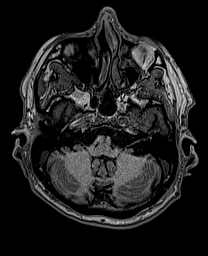
[im 41/144]
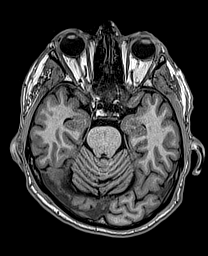
[im 62/144]
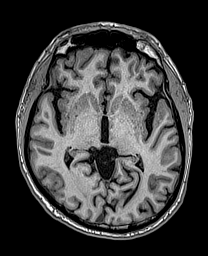
[im 82/144]
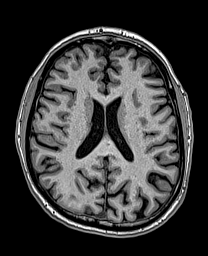
[im 103/144]
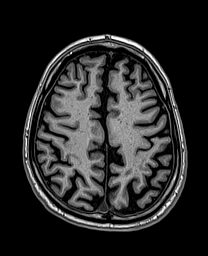
[im 123/144]
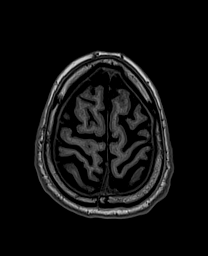
[im 144/144]
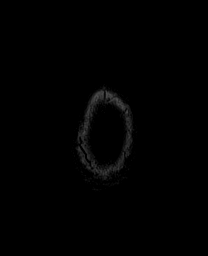

[Series 13: cor dwi_tracew · coronal · 5.0mm · 1.53mm/px · 3 of 60 slices shown]
[im 1/60]
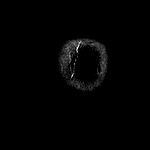
[im 30/60]
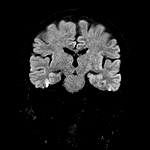
[im 60/60]
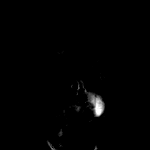

[Series 14: cor dwi_adc · coronal · 5.0mm · 1.53mm/px · 2 of 30 slices shown]
[im 1/30]
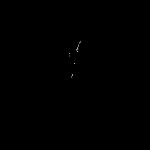
[im 30/30]
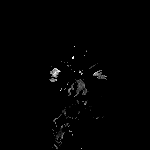

[Series 15: T2 post-contrast · coronal · 5.0mm · 0.57mm/px · 2 of 30 slices shown]
[im 1/30]
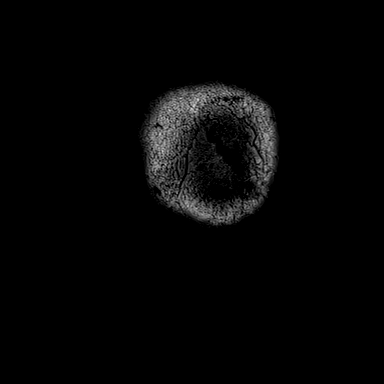
[im 30/30]
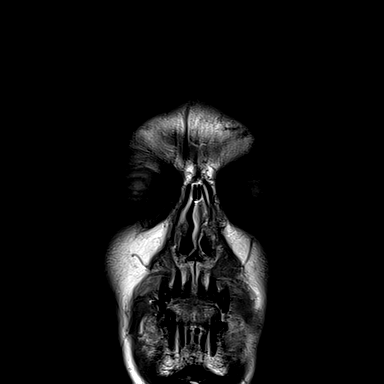

[Series 16: T1 post-contrast · axial · 1.0mm · 0.94mm/px · z∈[-17,+124]mm · 8 of 144 slices shown (1 of 3)]
[im 1/144]
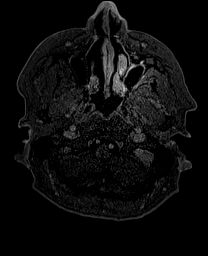
[im 21/144]
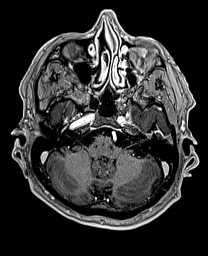
[im 41/144]
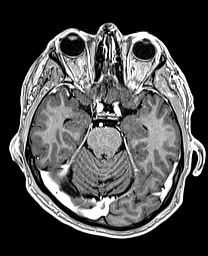
[im 62/144]
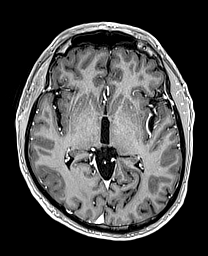
[im 82/144]
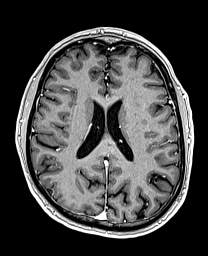
[im 103/144]
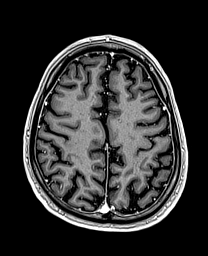
[im 123/144]
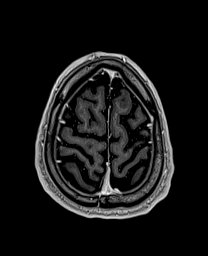
[im 144/144]
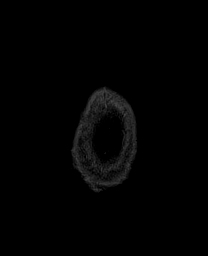

[Series 17: T1 · sagittal · 4.0mm · 0.94mm/px · 2 of 30 slices shown (3 of 4)]
[im 1/30]
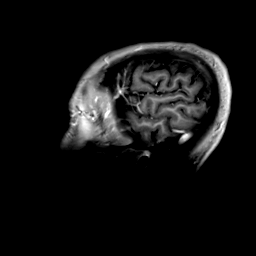
[im 30/30]
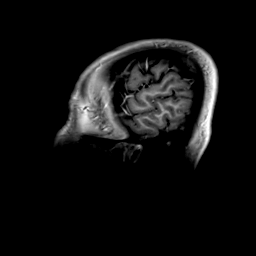

[Series 18: T1 · coronal · 4.0mm · 0.94mm/px · 2 of 30 slices shown (4 of 4)]
[im 1/30]
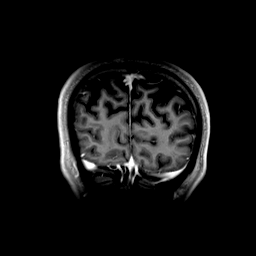
[im 30/30]
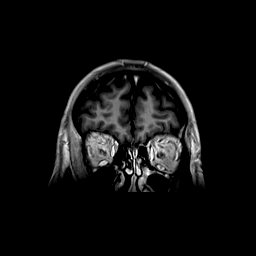

[Series 19: T1 post-contrast · coronal · 5.0mm · 0.43mm/px · 2 of 30 slices shown (2 of 3)]
[im 1/30]
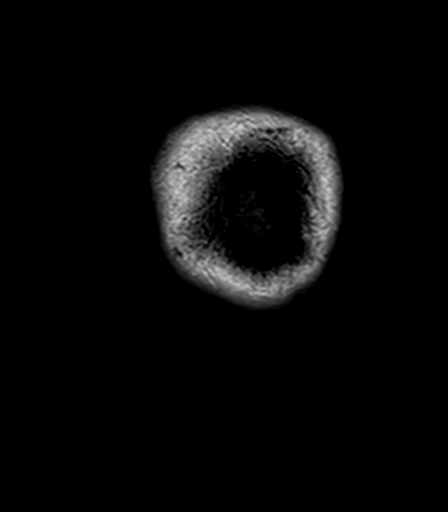
[im 30/30]
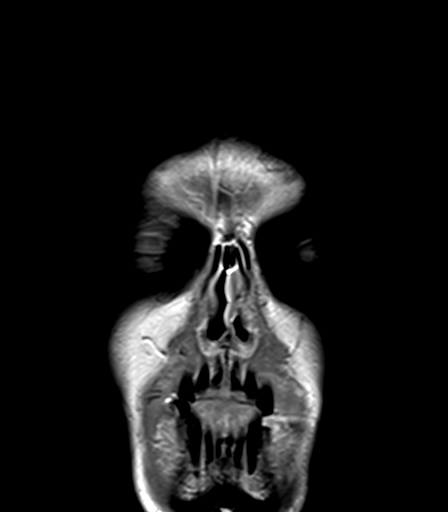

[Series 20: T1 post-contrast · sagittal · 5.0mm · 0.75mm/px · 1 of 26 slices shown (3 of 3)]
[im 1/26]
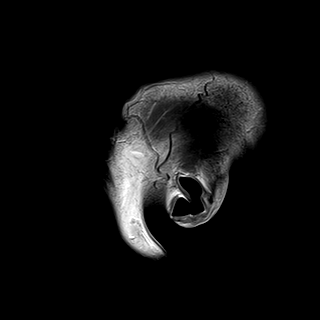

[48 of 48 positions shown; findings below may reference images not displayed]

FINDINGS: Brain: There is no acute infarction or intracranial hemorrhage.
There is no intracranial mass, mass effect, or edema. There is no
hydrocephalus or extra-axial fluid collection. Ventricles and sulci
are within normal limits in size and configuration. No abnormal
enhancement.

Vascular: Major vessel flow voids at the skull base are preserved.

Skull and upper cervical spine: Normal marrow signal is preserved.

Sinuses/Orbits: Paranasal sinuses are aerated. Orbits are
unremarkable.

Other: Sella is unremarkable.  Mastoid air cells are clear.
IMPRESSION: No evidence of recent infarction, hemorrhage, or mass. No abnormal
enhancement.

## 2021-08-27 MED ORDER — QUETIAPINE FUMARATE 50 MG PO TABS
50.0000 mg | ORAL_TABLET | Freq: Every day | ORAL | Status: DC
Start: 2021-08-27 — End: 2021-08-28
  Administered 2021-08-27: 50 mg via ORAL
  Filled 2021-08-27 (×2): qty 1

## 2021-08-27 MED ORDER — GADOBUTROL 1 MMOL/ML IV SOLN
10.0000 mL | Freq: Once | INTRAVENOUS | Status: AC | PRN
  Administered 2021-08-27: 10 mL via INTRAVENOUS

## 2021-08-27 NOTE — Progress Notes (Signed)
[facility] MD Progress Note  08/27/2021 2:34 PM TC KAPUSTA  MRN:  [id number] Subjective:   Jaeshawn Silvio is a 61 yr old male who presented on 3/8 to [facility] with worsening depression and anxiety and SI, he was admitted to [facility] on 3/10.  PPHx is significant for Depression and a previous hospitalization [facility] in [phone]).   Case was discussed in the multidisciplinary team. MAR was reviewed and patient was compliant with medications.  He did not require any PRNs.    Psychiatric Team made the following recommendations yesterday: -Continue Pristiq 50 mg daily. -Continue Remeron '15mg'$  QHS -Continue Melatonin 5 mg QHS -Continue Seroquel 25 mg TID PRN panic  -Continue Seroquel 50 mg QHS PRNS sleep -Continue Librium 5 mg TID to end 3/19    On interview today patient reports his sleep is still not restful.  He states that moving to the [address] was helpful because it was quieter than the [place].  He states that he thinks his stomach issues could be playing a part in it.  He reports that his appetite is doing okay.  He reports no SI, HI, or AVH.  He reports no [place], Ideas of Reference, or other First Rank symptoms.   He reports that he is still having issues with his depression and anxiety.  He reports he still does not feel like himself.  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  He states that last week when he was at work his [employer] Comprehension was significantly decreased.  He states he is still having a tremor on the left side of his body.  Discussed that he was scheduled to get an MRI today at noon and would be able to get some answers for this.  Discussed with him that since sleep seems to be such a significant component of his issues scheduling his Seroquel so that he gets that every night.  He was agreeable to this.  He does report some lightheadedness and sweats (he states these are not from withdrawal).  He reports continued stomach  issues with indigestion, stomachache, and loose stool.  When asked how long this has been going on he states for the last 3 to 4 months and that he was supposed to have an appointment with his PCP today for this issue.  Discussed rescheduling this for further work-up on that.  He reports no other concerns at present.   Principal Problem: [name], recurrent severe, without psychosis ([place]) Diagnosis: Principal Problem:   [name], recurrent severe, without psychosis ([place]) Active Problems:   Complaints of memory disturbance   Tremor of both hands   Smoker  Total Time spent with patient:  I personally spent 30 minutes on the unit in direct patient care. The direct patient care time included face-to-face time with the patient, reviewing the patient's chart, communicating with other professionals, and coordinating care. Greater than 50% of this time was spent in counseling or coordinating care with the patient regarding goals of hospitalization, psycho-education, and discharge planning needs.   Past Psychiatric History: Depression and a previous hospitalization ([facility] in [phone]).  Past Medical History:  Past Medical History:  Diagnosis Date   [name], recurrent severe, without psychosis ([place]) 08/23/2021    Past Surgical History:  Procedure Laterality Date   RHINOPLASTY     SEPTOPLASTY     SHOULDER ARTHROSCOPY Left    SHOULDER  ARTHROSCOPY WITH OPEN ROTATOR CUFF REPAIR AND DISTAL CLAVICLE ACROMINECTOMY Right 02/01/2021   Procedure: RIGHT SHOULDER ARTHROSCOPY WITH MINI OPEN ROTATOR CUFF REPAIR AND DISTAL CLAVICLE EXCISION;  Surgeon: Thornton Park, MD;  Location: [facility] ORS;  Service: Orthopedics;  Laterality: Right;   SPINE SURGERY     lumbar laminectomy   WRIST SURGERY Bilateral    corpal tunnel   Family History: History reviewed. No pertinent family history. Family Psychiatric  History: Daughter- Depression   Otherwise unknown due to adoption Social History:  Social History   Substance and Sexual Activity  Alcohol Use Yes   Alcohol/week: 14.0 standard drinks   Types: 14 Standard drinks or equivalent per week     Social History   Substance and Sexual Activity  Drug Use No    Social History   Socioeconomic History   Marital status: Single    Spouse name: Not on file   Number of children: Not on file   Years of education: Not on file   Highest education level: Not on file  Occupational History   Not on file  Tobacco Use   Smoking status: Every Day    Packs/day: 1.00    Years: 30.00    Pack years: 30.00    Types: Cigarettes    Last attempt to quit: 11/21/2020    Years since quitting: 0.7   Smokeless tobacco: Former    Types: Chew    Quit date: 1990  Vaping Use   Vaping Use: Never used  Substance and Sexual Activity   Alcohol use: Yes    Alcohol/week: 14.0 standard drinks    Types: 14 Standard drinks or equivalent per week   Drug use: No   Sexual activity: Not Currently  Other Topics Concern   Not on file  Social History Narrative   Not on file   Social Determinants of Health   Financial Resource Strain: Not on file  Food Insecurity: Not on file  Transportation Needs: Not on file  Physical Activity: Not on file  Stress: Not on file  Social Connections: Not on file   Additional Social History:                         Sleep:  Reports number of hours is good , however, it is not restful sleep  Appetite:  Good  Current Medications: Current Facility-Administered Medications  Medication Dose Route Frequency Provider Last Rate Last Admin   acetaminophen (TYLENOL) tablet 650 mg  650 mg Oral Q6H PRN White, Patrice L, NP       alum & mag hydroxide-simeth (MAALOX/MYLANTA) 200-200-20 MG/5ML suspension 30 mL  30 mL Oral Q4H PRN White, Patrice L, NP       chlordiazePOXIDE (LIBRIUM) capsule 5 mg  5 mg Oral TID Maida Sale, MD   5 mg at 08/27/21 1115    desvenlafaxine (PRISTIQ) 24 hr tablet 50 mg  50 mg Oral Daily Hill, Jackie Plum, MD   50 mg at 08/27/21 0749   feeding supplement (ENSURE ENLIVE / ENSURE PLUS) liquid 237 mL  237 mL Oral BID BM Massengill, Ovid Curd, MD   237 mL at 08/27/21 1117   magnesium hydroxide (MILK OF MAGNESIA) suspension 30 mL  30 mL Oral Daily PRN White, Patrice L, NP       melatonin tablet 5 mg  5 mg Oral QHS Hill, Jackie Plum, MD   5 mg at 08/26/21 2054   mirtazapine (REMERON) tablet 15 mg  15  mg Oral QHS PRN Maida Sale, MD   15 mg at 08/25/21 2138   multivitamin with minerals tablet 1 tablet  1 tablet Oral Daily White, Patrice L, NP   1 tablet at 08/27/21 1324   nicotine polacrilex (NICORETTE) gum 2 mg  2 mg Oral PRN Janine Limbo, MD   2 mg at 08/26/21 4010   QUEtiapine (SEROQUEL) tablet 25 mg  25 mg Oral TID PRN Maida Sale, MD   25 mg at 08/27/21 1115   QUEtiapine (SEROQUEL) tablet 50 mg  50 mg Oral QHS Briant Cedar, MD       thiamine tablet 100 mg  100 mg Oral Daily White, Patrice L, NP   100 mg at 08/27/21 2725   Vitamin D (Ergocalciferol) (DRISDOL) capsule 50,000 Units  50,000 Units Oral Q7 days Maida Sale, MD   50,000 Units at 08/25/21 0813    Lab Results: No results found for this or any previous visit (from the past 45 hour(s)).  Blood Alcohol level:  Lab Results  Component Value Date   ETH 82 (H) 36/64/4034    Metabolic Disorder Labs: Lab Results  Component Value Date   HGBA1C 5.2 08/22/2021   MPG 102.54 08/22/2021   No results found for: PROLACTIN Lab Results  Component Value Date   CHOL 147 08/22/2021   TRIG 109 08/22/2021   HDL 49 08/22/2021   CHOLHDL 3.0 08/22/2021   VLDL 22 08/22/2021   LDLCALC 76 08/22/2021   LDLCALC 91 01/15/2021    Physical Findings: AIMS:  , ,  ,  ,    CIWA:  CIWA-Ar Total: 3 COWS:     Musculoskeletal: Strength & Muscle Tone: within normal limits Gait & Station:  In bed during entire interview Patient  leans: N/A  Psychiatric Specialty Exam:  Presentation  General Appearance: Appropriate for Environment; Casual  Eye Contact:Good  Speech:Clear and Coherent; Normal Rate  Speech Volume:Normal  Handedness:Right   Mood and Affect  Mood:Depressed; Anxious  Affect:Congruent; Depressed   Thought Process  Thought Processes:Coherent  Descriptions of Associations:Intact  Orientation:Full (Time, Place and Person)  Thought Content:Logical; Rumination (about work) No SI, HI, or AVH. No Paranoia, Ideas of Reference, or First Rank symptoms.  History of Schizophrenia/Schizoaffective disorder:No  Duration of Psychotic Symptoms:No data recorded Hallucinations:Hallucinations: None  Ideas of Reference:None  Suicidal Thoughts:Suicidal Thoughts: No  Homicidal Thoughts:Homicidal Thoughts: No   Sensorium  Memory:Immediate Fair; Recent Fair  Judgment:Fair  Insight:Fair   Executive Functions  Concentration:Fair  Attention Span:Fair  [place]   Psychomotor Activity  Psychomotor Activity:Psychomotor Activity: Tremor  Assets  Assets:Housing; Transportation   Sleep  Sleep:Sleep: Good Number of Hours of Sleep: 7.25   Physical Exam: Physical Exam Constitutional:      General: He is not in acute distress.    Appearance: Normal appearance. He is normal weight. He is not ill-appearing or toxic-appearing.  HENT:     Head: Normocephalic and atraumatic.  Pulmonary:     Effort: Pulmonary effort is normal.  Musculoskeletal:        General: Normal range of motion.  Neurological:     General: No focal deficit present.     Mental Status: He is alert.   Review of Systems  Respiratory:  Negative for cough and shortness of breath.   Cardiovascular:  Negative for chest pain.  Gastrointestinal:  Negative for abdominal pain, constipation, diarrhea, nausea and vomiting.  Neurological:  Negative for dizziness, weakness and  headaches.  Psychiatric/Behavioral:  Positive for depression. Negative for hallucinations and suicidal ideas. The patient is nervous/anxious.   Blood pressure 127/85, pulse 72, temperature 97.8 F (36.6 C), temperature source Oral, resp. rate 16, height '6\' 1"'$  (1.854 m), weight 101.4 kg, SpO2 98 %. Body mass index is 29.5 kg/m.   Treatment Plan Summary: Daily contact with patient to assess and evaluate symptoms and progress in treatment and Medication management  Dante Cromie is a 61 yr old male who presented on 3/8 to [facility] with worsening depression and anxiety and SI, he was admitted to [facility] on 3/10.  PPHx is significant for Depression and a previous hospitalization [facility] in [phone]).   Rajat continues to ruminate about his work and his reported inability to concentrate and think.  As sleep continues to be a significant issue for him so we will schedule his Seroquel to better help his sleep and augment his Pristiq.  His MRI showed no acute abnormalities.  We will order an EKG to monitor Qtc.  For his stomach issues which have been ongoing for about 4 months he should call his PCP office to reschedule his appointment that was supposed to be today.  We will continue to monitor.    MDD, Recurrent, Severe: -Continue Pristiq 50 mg daily for mood -Continue Remeron '15mg'$  QHS for mood and sleep -Continue Melatonin 5 mg QHS for sleep -Continue Seroquel 25 mg TID PRN panic  -Continue Seroquel 50 mg QHS PRNS sleep   EtOH Withdrawal: -Continue CIWA, last score 3/12 = 3 -Continue Librium 5 mg TID to end 3/19 -Continue Thiamine 100 mg daily for nutritional supplementation -Continue Multivitamin daily for nutritional supplementation   Nicotine Dependence: -Continue Nicorette gum 2 mg PRN   Stomach Issues: -Reschedule Follow up appointment   -Continue Ensure BID between meals for nutrition -Continue Vit D 50000 units every 7 days for deficiency -Continue PRN's: Tylenol, Maalox, Milk  of Magnesia   Labs on Admission: CMP: WNL except CO2: 21,  CBC: WNL,  EtOH: 82,  Mag: 2.2,  A1c: 5.2,  Lipid Panel: WNL,  TSH: 1.514,  UDS: Benzo and THC Positive,  UA: WNL except Color: Amber,  Appearance: Cloudy,  Bacteria: Rare 3/13: MRI Brain W/ WO Contrast: No signs of recent infarct, hemorrhage, or mass.  No abnormal Enhancement.   Briant Cedar, MD 08/27/2021, 2:34 PM

## 2021-08-27 NOTE — Group Note (Signed)
Recreation Therapy Group Note ? ? ?Group Topic:Stress Management  ?Group Date: 08/27/2021 ?Start Time: 0930 ?End Time: 0950 ?Facilitators: Victorino Sparrow, LRT,CTRS ?Location: [place] ? ? ?Goal Area(s) Addresses:  ?Patient will identify positive stress management techniques. ?Patient will identify benefits of using stress management post d/c. ?  ?Group Description:  Meditation.  LRT played a meditation that focused on centering yourself to start the day off and prepare for what the day has in store.  Patients were to follow along with the meditation as it played to fully engage with the meditation. ? ? ?Affect/Mood: N/A ?  ?Participation Level: Did not attend ?  ? ?Clinical Observations/Individualized Feedback:   ? ? ?Plan: Continue to engage patient in RT group sessions 2-3x/week. ? ? ?Victorino Sparrow, LRT,CTRS ?08/27/2021 12:04 PM ?

## 2021-08-27 NOTE — Group Note (Signed)
LCSW Group Therapy Note ? ? ?Group Date: 08/27/2021 ?Start Time: 1300 ?End Time: 1400 ? ?Type of Therapy and Topic:  Group Therapy:  Strengths Exploration ?  ?  ?Participation Level: Did not attend  ?  ?Description of Group: ?This group allows individuals to explore their strengths, learn to use strengths in new ways to improve well-being. Strengths-based interventions involve identifying strengths, understanding how they are used, and learning new ways to apply them. Individuals will identify their strengths, and then explore their roles in different areas of life (relationships, professional life, and personal fulfillment). Individuals will think about ways in which they currently use their strengths, along with new ways they could begin using them. ?  ?  ?Therapeutic Goals ?Patient will verbalize two of their strengths ?Patient will identify how their strengths are currently used ?Patient will identify two new ways to apply their strengths  ?Patients will create a plan to apply their strengths in their daily lives  ?  ?  ?Summary of Patient Progress:  Did not attend   ? ?Darleen Crocker, LCSWA ?08/27/2021  2:21 PM   ? ?

## 2021-08-27 NOTE — Progress Notes (Signed)
?   08/26/21 1300  ?Psych Admission Type (Psych Patients Only)  ?Admission Status Voluntary  ?Psychosocial Assessment  ?Patient Complaints Anxiety  ?Eye Contact Fair  ?Facial Expression Anxious  ?Affect Appropriate to circumstance  ?Speech Logical/coherent  ?Interaction Assertive  ?Motor Activity Tremors  ?Appearance/Hygiene Unremarkable  ?Behavior Characteristics Appropriate to situation  ?Mood Anxious  ?Thought Process  ?Coherency WDL  ?Content WDL  ?Delusions None reported or observed  ?Perception WDL  ?Hallucination None reported or observed  ?Judgment WDL  ?Confusion WDL  ?Danger to Self  ?Current suicidal ideation? Denies  ?Self-Injurious Behavior No self-injurious ideation or behavior indicators observed or expressed   ?Agreement Not to Harm Self Yes  ?Description of Agreement contracts for safety verbal  ?Danger to Others  ?Danger to Others None reported or observed  ? ? ?

## 2021-08-27 NOTE — [facility] Group Notes (Signed)
Spiritual care group on grief and loss facilitated by chaplains Amy Burris, MDiv and Katy Claussen, BCC  ? ?Group Goal:  ?Support / Education around grief and loss  ? ?Members engage in facilitated group support and psycho-social education.  ? ?Group Description:  ?Spiritual care group on grief and loss facilitated by chaplain Katy Claussen, BCC  ? ?Group Goal:  ? ?Support / Education around grief and loss  ? ?Members engage in facilitated group support and psycho-social education.  ? ?Group Description:  ? ?Following introductions and group rules, group members engaged in facilitated group dialog and support around topic of loss, with particular support around experiences of loss in their lives. Group Identified types of loss (relationships / self / things) and identified patterns, circumstances, and changes that precipitate losses. Reflected on thoughts / feelings around loss, normalized grief responses, and recognized variety in grief experience. Group noted Worden's four tasks of grief in discussion.  ? ?Group drew on Adlerian / Rogerian, narrative, MI,  ? ?Patient Progress: Pt did not attend group. ?

## 2021-08-27 NOTE — Progress Notes (Signed)
The patient attended the evening A.A.meeting and was appropriate.  

## 2021-08-27 NOTE — Progress Notes (Signed)
D. Pt presents as friendly, has been calm, cooperative on the unit, observed interacting well with peers and staff, and attending groups. Pt currently denies SI/HI and AVH ?A. Labs and vitals monitored. Pt given and educated on medications. Pt supported emotionally and encouraged to express concerns and ask questions.   ?R. Pt remains safe with 15 minute checks. Will continue POC. ? ?  ?

## 2021-08-27 NOTE — Progress Notes (Signed)
?   08/27/21 2202  ?Psych Admission Type (Psych Patients Only)  ?Admission Status Voluntary  ?Psychosocial Assessment  ?Patient Complaints Anxiety  ?Eye Contact Fair;Brief  ?Facial Expression Anxious  ?Affect Appropriate to circumstance  ?Speech Logical/coherent  ?Interaction Assertive  ?Motor Activity Tremors  ?Appearance/Hygiene Unremarkable  ?Behavior Characteristics Cooperative;Appropriate to situation  ?Mood Anxious  ?Thought Process  ?Coherency WDL  ?Content WDL  ?Delusions None reported or observed  ?Perception WDL  ?Hallucination None reported or observed  ?Judgment WDL  ?Confusion None  ?Danger to Self  ?Current suicidal ideation? Denies  ?Self-Injurious Behavior No self-injurious ideation or behavior indicators observed or expressed   ?Agreement Not to Harm Self Yes  ?Description of Agreement Verbal Contract  ?Danger to Others  ?Danger to Others None reported or observed  ? ? ?

## 2021-08-28 ENCOUNTER — Encounter: Payer: BC Managed Care – PPO | Admitting: Family

## 2021-08-28 MED ORDER — MIRTAZAPINE 15 MG PO TABS
15.0000 mg | ORAL_TABLET | Freq: Every day | ORAL | Status: DC
  Filled 2021-08-28 (×2): qty 1

## 2021-08-28 MED ORDER — CHLORDIAZEPOXIDE HCL 5 MG PO CAPS
5.0000 mg | ORAL_CAPSULE | Freq: Two times a day (BID) | ORAL | Status: AC
  Administered 2021-08-28 – 2021-08-30 (×4): 5 mg via ORAL
  Filled 2021-08-28 (×4): qty 1

## 2021-08-28 MED ORDER — MIRTAZAPINE 7.5 MG PO TABS
7.5000 mg | ORAL_TABLET | Freq: Every day | ORAL | Status: DC
  Administered 2021-08-28 – 2021-08-30 (×3): 7.5 mg via ORAL
  Filled 2021-08-28 (×5): qty 1

## 2021-08-28 NOTE — Progress Notes (Signed)
?   08/28/21 0843  ?Psych Admission Type (Psych Patients Only)  ?Admission Status Voluntary  ?Psychosocial Assessment  ?Patient Complaints Anxiety  ?Eye Contact Fair  ?Facial Expression Anxious  ?Affect Appropriate to circumstance  ?Speech Logical/coherent  ?Interaction Assertive  ?Motor Activity Tremors  ?Appearance/Hygiene Unremarkable  ?Behavior Characteristics Cooperative;Appropriate to situation  ?Mood Anxious  ?Thought Process  ?Coherency WDL  ?Content WDL  ?Delusions None reported or observed  ?Perception WDL  ?Hallucination None reported or observed  ?Judgment WDL  ?Confusion None  ?Danger to Self  ?Current suicidal ideation? Denies  ?Self-Injurious Behavior No self-injurious ideation or behavior indicators observed or expressed   ?Description of Agreement Verbal contract for safety  ?Danger to Others  ?Danger to Others None reported or observed  ? ?Patient affect is appropriate for situation, alert and oriented to person, place and situation during shift assessment. Pt. States to sleeping poorly, and having poor appetite. Pt is compliant with schedule medication, he tolerated medication well with no side effect noted. Pt. State his energy and concentration are low. Pt. States goals as "increased focus and self confidence." Patient denies HI/AVH, but states he sometimes have SI. Patient verbally contracted for safety. No distress noted at this time. Staff will continue to support and encourage pt. to reach goal.  ?

## 2021-08-28 NOTE — Progress Notes (Addendum)
[facility] MD Progress Note ? ?08/28/2021 4:12 PM ?[name]  ?MRN:  [id number] ?Subjective:   ?Billy Roman is a 61 yr old male who presented on 3/8 to [facility] with worsening depression and anxiety and SI, he was admitted to [facility] on 3/10.  PPHx is significant for Depression and a previous hospitalization [facility] in [phone]). ? ? ?Case was discussed in the multidisciplinary team. MAR was reviewed and patient was compliant with medications.  He received PRN Seroquel for anxiety once yesterday.  ? ? ?Psychiatric Team made the following recommendations yesterday: ?-Continue Pristiq 50 mg daily. ?-Continue Melatonin 5 mg QHS ?-Continue Seroquel 25 mg TID PRN panic  ?-Schedule Seroquel 50 mg QHS for sleep ?-Continue Librium 5 mg TID taper ? ?On interview today patient reports his sleep had minor improvements but is still not good and he is still waking up tired.  He reports that his appetite is doing good.  He reports no SI but does state he does not feel useful to anyone.  He reports no HI or AVH.  He reports no Paranoia, Ideas of Reference, or other First Rank symptoms. ? ?He reports no issues with his medications.  He reports his main concerns are still his tremor and fatigue/inability to focus.  Discussed with him that his MRI did not show any lesions or masses in his brain.  Discussed that at this point further work-up would need to be done by neurology on an outpatient basis as he may need further testing such as an nerve conduction testing or further cognitive testing.  Discussed that his memory issues could be due to his depression and that as his depression improves his memory issues could similarly improve.  Discussed that since he was having concerns about his memory/cognition we would administer a MOCA tomorrow.   ? ?Discussed that his sedation could be from his Librium so would increase the rate of his taper off of it.  Also discussed with him that Seroquel can improve sleep it does have potentially more  side effects and so would recommend switching to Remeron as this would both help with sleep and his depression.  He was agreeable to making the switch. ? 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  Discussed him that while medications can help with his depression and anxiety he may need to also consider changing his current work. ? ?He reports continuing to have some gas/pain that has been chronic and ongoing for the last 3 to 4 months.  He reports no other concerns at present. ? ? ?Principal Problem: [name], recurrent severe, without psychosis ([place]) ?Diagnosis: Principal Problem: ?  [name], recurrent severe, without psychosis ([place]) ?Active Problems: ?  Complaints of memory disturbance ?  Tremor of both hands ?  Smoker ? ?Total Time spent with patient:  ?I personally spent 30 minutes on the unit in direct patient care. The direct patient care time included face-to-face time with the patient, reviewing the patient's chart, communicating with other professionals, and coordinating care. Greater than 50% of this time was spent in counseling or coordinating care with the patient regarding goals of  hospitalization, psycho-education, and discharge planning needs. ? ? ?Past Psychiatric History: Depression and a previous hospitalization ([facility] in [phone]). ? ?Past Medical History:  ?Past Medical History:  ?Diagnosis Date  ? [name], recurrent severe, without psychosis ([place]) 08/23/2021  ?  ?Past Surgical History:  ?Procedure Laterality Date  ? RHINOPLASTY    ? SEPTOPLASTY    ? SHOULDER ARTHROSCOPY Left   ? SHOULDER ARTHROSCOPY WITH OPEN ROTATOR CUFF REPAIR  AND DISTAL CLAVICLE ACROMINECTOMY Right 02/01/2021  ? Procedure: RIGHT SHOULDER ARTHROSCOPY WITH MINI OPEN ROTATOR CUFF REPAIR AND DISTAL CLAVICLE EXCISION;  Surgeon: Thornton Park, MD;  Location: [facility] ORS;  Service: Orthopedics;  Laterality: Right;  ? SPINE SURGERY    ? lumbar laminectomy  ? WRIST SURGERY Bilateral   ? corpal tunnel  ? ?Family History: see H&P ? ?Family Psychiatric  History: Daughter- Depression  ?Otherwise unknown due to adoption ? ?Social History:  ?Social History  ? ?Substance and Sexual Activity  ?Alcohol Use Yes  ? Alcohol/week: 14.0 standard drinks  ? Types: 14 Standard drinks or equivalent per week  ?   ?Social History  ? ?Substance and Sexual Activity  ?Drug Use No  ?  ?Social History  ? ?Socioeconomic History  ? Marital status: Single  ?  Spouse name: Not on file  ? Number of children: Not on file  ? Years of education: Not on file  ? Highest education level: Not on file  ?Occupational History  ? Not on file  ?Tobacco Use  ? Smoking status: Every Day  ?  Packs/day: 1.00  ?  Years: 30.00  ?  Pack years: 30.00  ?  Types: Cigarettes  ?  Last attempt to quit: 11/21/2020  ?  Years since quitting: 0.7  ? Smokeless tobacco: Former  ?  Types: Chew  ?  Quit date: 61  ?Vaping Use  ? Vaping Use: Never used  ?Substance and Sexual Activity  ? Alcohol use: Yes  ?  Alcohol/week: 14.0 standard drinks  ?  Types: 14 Standard drinks or equivalent per week  ? Drug use: No  ? Sexual activity: Not Currently  ?Other Topics Concern  ? Not on file  ?Social History Narrative  ? Not on file  ? ?Social Determinants of Health  ? ?Financial Resource Strain: Not on file  ?Food Insecurity: Not on file  ?Transportation Needs: Not on file  ?Physical Activity: Not on file  ?Stress: Not on file  ?Social Connections: Not on file  ? ?Additional Social History:  ?  ?  ?  ?  ?  ?  ?  ?  ?  ?  ?  ? ?Sleep: Fair ? ?Appetite:  Good ? ?Current Medications: ?Current Facility-Administered Medications  ?Medication Dose Route  Frequency Provider Last Rate Last Admin  ? acetaminophen (TYLENOL) tablet 650 mg  650 mg Oral Q6H PRN White, Patrice L, NP      ? alum & mag hydroxide-simeth (MAALOX/MYLANTA) 200-200-20 MG/5ML suspension 30 mL  30 mL Oral Q4H PRN White, Patrice L, NP      ? chlordiazePOXIDE (LIBRIUM) capsule 5 mg  5 mg Oral BID Briant Cedar, MD      ? desvenlafaxine (PRISTIQ) 24 hr tablet 50 mg  50 mg Oral Daily Hill, Jackie Plum, MD   50 mg at 08/28/21 8850  ? feeding supplement (ENSURE ENLIVE / ENSURE PLUS) liquid 237 mL  237 mL Oral BID BM Massengill, Ovid Curd, MD   237 mL at 08/27/21 1117  ?  magnesium hydroxide (MILK OF MAGNESIA) suspension 30 mL  30 mL Oral Daily PRN White, Patrice L, NP      ? melatonin tablet 5 mg  5 mg Oral QHS Hill, Jackie Plum, MD   5 mg at 08/27/21 2202  ? mirtazapine (REMERON) tablet 7.5 mg  7.5 mg Oral QHS Pashayan, Redgie Grayer, MD      ? multivitamin with minerals tablet 1 tablet  1 tablet Oral Daily White, Patrice L, NP   1 tablet at 08/28/21 6503  ? nicotine polacrilex (NICORETTE) gum 2 mg  2 mg Oral PRN Janine Limbo, MD   2 mg at 08/28/21 1515  ? QUEtiapine (SEROQUEL) tablet 25 mg  25 mg Oral TID PRN Maida Sale, MD   25 mg at 08/27/21 1115  ? thiamine tablet 100 mg  100 mg Oral Daily White, Patrice L, NP   100 mg at 08/28/21 5465  ? Vitamin D (Ergocalciferol) (DRISDOL) capsule 50,000 Units  50,000 Units Oral Q7 days Maida Sale, MD   50,000 Units at 08/25/21 0813  ? ? ?Lab Results: No results found for this or any previous visit (from the past 48 hour(s)). ? ?Blood Alcohol level:  ?Lab Results  ?Component Value Date  ? ETH 82 (H) 08/22/2021  ? ? ?Metabolic Disorder Labs: ?Lab Results  ?Component Value Date  ? HGBA1C 5.2 08/22/2021  ? MPG 102.54 08/22/2021  ? ?No results found for: PROLACTIN ?Lab Results  ?Component Value Date  ? CHOL 147 08/22/2021  ? TRIG 109 08/22/2021  ? HDL 49 08/22/2021  ? CHOLHDL 3.0 08/22/2021  ? VLDL 22 08/22/2021  ? [place] 76  08/22/2021  ? [place] 91 01/15/2021  ? ? ?Physical Findings: ?CIWA:  CIWA-Ar Total: 1 ? ?Musculoskeletal: ?Strength & Muscle Tone: within normal limits ?Gait & Station: steady, normal ?Patient leans: N/A ? ?Psychiatr

## 2021-08-28 NOTE — Progress Notes (Signed)
Patient currently admitted at [facility]. Admission date 08/23/2021. ?

## 2021-08-28 NOTE — Group Note (Signed)
Recreation Therapy Group Note ? ? ?Group Topic:Animal Assisted Therapy   ?Group Date: 08/28/2021 ?Start Time: 1430 ?End Time: 8341 ?Facilitators: Victorino Sparrow, LRT,CTRS ?Location: [place] ? ? ?Animal-Assisted Activity (AAA) Program Checklist/Progress Note ?Patient Eligibility Criteria Checklist & Daily Group note for Rec Tx Intervention ? ? ?AAA/T Program Assumption of Risk Form signed by Patient/ or Parent Legal Guardian YES ? ?Patient is free of allergies or severe asthma  YES ? ?Patient reports no fear of animals YES ? ?Patient reports no history of cruelty to animals YES ? ?Patient understands their participation is voluntary YES ? ?Patient washes hands before animal contact YES ? ?Patient washes hands after animal contact YES ? ? ?Group Description: Patients provided opportunity to interact with trained and credentialed [employer] dog and the community volunteer/dog handler. Patients practiced appropriate animal interaction and were educated on dog safety outside of the hospital in common community settings. Patients were allowed to use dog toys and other items to practice commands, engage the dog in play, and/or complete routine aspects of animal care.  ? ?Education: Contractor, Pensions consultant, Communication & Social Skills  ? ? ?Affect/Mood: Appropriate ?  ?Participation Level: Engaged ?  ? ?Clinical Observations/Individualized Feedback:  Pt attended and participated in group session.  Pt asked questions of volunteer and shared stories of pets back home. ? ? ?Plan: Continue to engage patient in RT group sessions 2-3x/week. ? ? ?Victorino Sparrow, LRT,CTRS ?08/28/2021 4:07 PM ?

## 2021-08-28 NOTE — Plan of Care (Signed)
?  Problem: Coping: Goal: Ability to verbalize frustrations and anger appropriately will improve Outcome: Progressing Goal: Ability to demonstrate self-control will improve Outcome: Progressing   Problem: Safety: Goal: Periods of time without injury will increase Outcome: Progressing   

## 2021-08-28 NOTE — Group Note (Signed)
Date:  08/28/2021 ?Time:  4:11 PM ? ?Group Topic/Focus:  ?Self Care:   The focus of this group is to help patients understand the importance of self-care in order to improve or restore emotional, physical, spiritual, interpersonal, and financial health. ? ? ? ?Participation Level:  Active ? ?Participation Quality:  Appropriate ? ?Affect:  Appropriate ? ?Cognitive:  Appropriate ? ?Insight: Good ? ?Engagement in Group:  Engaged ? ?Modes of Intervention:  Education ? ?Additional Comments:  Patient identified his world-view as a way to cope with all of his  life's issues. ? ?Jerrye Beavers ?08/28/2021, 4:11 PM ? ?

## 2021-08-28 NOTE — Progress Notes (Signed)
Adult Psychoeducational Group Note ? ?Date:  08/28/2021 ?Time:  11:24 PM ? ?Group Topic/Focus:  ?Wrap-Up Group:   The focus of this group is to help patients review their daily goal of treatment and discuss progress on daily workbooks. ? ?Participation Level:  Did Not Attend ? ?Participation Quality:   Did Not Attend ? ?Affect:   Did Not Attend ? ?Cognitive:   Did Not Attend ? ?Insight: None ? ?Engagement in Group:   Did Not Attend ? ?Modes of Intervention:   Did Not Attend ? ?Additional Comments:  Pt was encouraged to attend wrap up group but did not attend. ? ?Candy Sledge ?08/28/2021, 11:24 PM ?

## 2021-08-29 ENCOUNTER — Encounter (HOSPITAL_COMMUNITY): Payer: Self-pay

## 2021-08-29 NOTE — Group Note (Unsigned)
Date:  08/29/2021 ?Time:  10:23 AM ? ?Group Topic/Focus:  ?Orientation:   The focus of this group is to educate the patient on the purpose and policies of crisis stabilization and provide a format to answer questions about their admission.  The group details unit policies and expectations of patients while admitted. ? ? ? ? ?Participation Level:  {BHH PARTICIPATION LEVEL:22264} ? ?Participation Quality:  {BHH PARTICIPATION QUALITY:22265} ? ?Affect:  {BHH AFFECT:22266} ? ?Cognitive:  {BHH COGNITIVE:22267} ? ?Insight: {BHH Insight2:20797} ? ?Engagement in Group:  {BHH ENGAGEMENT IN XJOIT:25498} ? ?Modes of Intervention:  {BHH MODES OF INTERVENTION:22269} ? ?Additional Comments:  *** ? ?Billy Roman ?08/29/2021, 10:23 AM ? ?

## 2021-08-29 NOTE — Group Note (Signed)
Date:  08/29/2021 ?Time:  10:03 AM ? ?Group Topic/Focus:  ?Orientation:   The focus of this group is to educate the patient on the purpose and policies of crisis stabilization and provide a format to answer questions about their admission.  The group details unit policies and expectations of patients while admitted. ? ? ? ?Participation Level:  Did Not Attend ? ?Participation Quality:   ? ?Affect:   ? ?Cognitive:   ? ?Insight:  ? ?Engagement in Group:   ? ?Modes of Intervention:   ? ?Additional Comments:   ? ?Garvin Fila ?08/29/2021, 10:03 AM ? ?

## 2021-08-29 NOTE — Progress Notes (Signed)
D:  Patient's self inventory sheet, patient has poor sleep, constant interruption.  Sleep medicine not helpful.  Fair appetite, low energy level, poor concentration.  Rated depression 6, hopeless 5, anxiety 2.  Denied withdrawals.  Then checked tremors, chilling, agitation, irritability.  Denied SI.  Contracts for safety.  Shoulder, calf pain, worst pain #4 in past 24 hours.  Goal is feel rested and motive to achieve success.  Plans to make up for loss of sleep and participate in care.  I am irritable from sleep disruption and constant noise which was unnecessary.  No discharge plans. ?A:  Medications administered per MD orders.  Emotional support and encouragement given patient. ?R:  Denied SI and HI, contracts for safety.  Denied A/V hallucinations.  Safety maintained with 15 minute checks. ?\ ?

## 2021-08-29 NOTE — Progress Notes (Addendum)
[facility] MD Progress Note ? ?08/29/2021 4:18 PM ?Billy Roman  ?MRN:  [id number] ? ?Subjective:   ?Billy Roman is a 61 yr old male who presented on 3/8 to [facility] with worsening depression and anxiety and SI, he was admitted to [facility] on 3/10.  PPHx is significant for Depression and a previous hospitalization [facility] in [phone]). ? ?Case was discussed in the multidisciplinary team. MAR was reviewed and patient was compliant with medications.  He received PRN Seroquel for anxiety once yesterday.  ? ?Psychiatric Team made the following recommendations yesterday: ?-Continue Pristiq 50 mg daily for mood ?-Change Remeron from PRN to scheduled at 7.5 mg QHS for mood and sleep ?-Continue Melatonin 5 mg QHS for sleep ?-Continue Seroquel 25 mg TID PRN anxiety ?-Stop Seroquel 50 mg QHS for sleep ?-Change Librium 5 mg from TID to BID today for 2 days then decrease further to once daily for 2 days then stopping ? ?On interview today patient reports he did not sleep well last night due to the constant opening and closing the door during safety checks.  He reports his appetite is doing okay and improving.  He reports no SI, HI, or AVH.  He reports no Paranoia, Ideas of Reference, or other First Rank symptoms.  He reports no issues with his medications.   ? ?Discussed with him that the safety checks have to be done every 15 minutes for every patient on the unit.  Discussed that 1 way to minimize disruptions during the night would be to prop his door open with the trash can in the room that way the techs would not need to open and close the door at night. ? ?He he states his main focus is still being able to function again and have his mental clarity being what it used to be.  Discussed with him that memory issues can be caused by significant depression but that also we would perform a MOCA today to get a baseline of where he is.  Discussed that we would put him in an ambulatory referral to neurology requesting an appointment in 2  weeks.  He was agreeable to this. ? ?Discussed with him that his EKG showed first-degree AV block.  Discussed that this is a slowdown in conduction between his atria and his ventricles.  Discussed that this is the mild form of this and at this point only monitoring is recommended.  Discussed that he should schedule a follow-up with his PCP to further discuss this and the monitoring needed.  Discussed that the PCP will make further recommendations whether to establish with a cardiologist at this time or wait.  He was agreeable to this and reported understanding. ? ?Discussed with him that since he had guns at his house Social Work would be calling his daughter to discuss securing them.  He reported a long history of safe use/ownership of guns and reports never having any SI to shoot himself nor any HI.  Discussed that Social Work would still need to talk with his daughter about them and he was agreeable to it. ? ?He reports no other concerns at present. ? ?Approximately 3 PM met with patient again to administer the [facility].  His score was 22 out of 30.  Discussed with him that this is mild cognitive impairment.  Discussed that a score of 26 and above is considered the normal range.  Discussed with him that his score could be due to to the depression but that this gives a baseline  for the neurologist to start from with their testing once he is discharged. ? ? ?Principal Problem: [name], recurrent severe, without psychosis ([place]) ?Diagnosis: Principal Problem: ?  [name], recurrent severe, without psychosis ([place]) ?Active Problems: ?  Complaints of memory disturbance ?  Tremor of both hands ?  Smoker ? ?Total Time spent with patient:  ?I personally spent 30 minutes on the unit in direct patient care. The direct patient care time included face-to-face time with the patient, reviewing the patient's chart, communicating with other professionals, and coordinating care. Greater  than 50% of this time was spent in counseling or coordinating care with the patient regarding goals of hospitalization, psycho-education, and discharge planning needs. ? ? ?Past Psychiatric History: Depression and a previous hospitalization ([facility] in [phone]). ? ?Past Medical History:  ?Past Medical History:  ?Diagnosis Date  ? [name], recurrent severe, without psychosis ([place]) 08/23/2021  ?  ?Past Surgical History:  ?Procedure Laterality Date  ? RHINOPLASTY    ? SEPTOPLASTY    ? SHOULDER ARTHROSCOPY Left   ? SHOULDER ARTHROSCOPY WITH OPEN ROTATOR CUFF REPAIR AND DISTAL CLAVICLE ACROMINECTOMY Right 02/01/2021  ? Procedure: RIGHT SHOULDER ARTHROSCOPY WITH MINI OPEN ROTATOR CUFF REPAIR AND DISTAL CLAVICLE EXCISION;  Surgeon: Thornton Park, MD;  Location: [facility] ORS;  Service: Orthopedics;  Laterality: Right;  ? SPINE SURGERY    ? lumbar laminectomy  ? WRIST SURGERY Bilateral   ? corpal tunnel  ? ?Family History: see H&P ? ?Family Psychiatric  History: Daughter- Depression  ?Otherwise unknown due to adoption ? ?Social History:  ?Social History  ? ?Substance and Sexual Activity  ?Alcohol Use Yes  ? Alcohol/week: 14.0 standard drinks  ? Types: 14 Standard drinks or equivalent per week  ?   ?Social History  ? ?Substance and Sexual Activity  ?Drug Use No  ?  ?Social History  ? ?Socioeconomic History  ? Marital status: Single  ?  Spouse name: Not on file  ? Number of children: Not on file  ? Years of education: Not on file  ? Highest education level: Not on file  ?Occupational History  ? Not on file  ?Tobacco Use  ? Smoking status: Every Day  ?  Packs/day: 1.00  ?  Years: 30.00  ?  Pack years: 30.00  ?  Types: Cigarettes  ?  Last attempt to quit: 11/21/2020  ?  Years since quitting: 0.7  ? Smokeless tobacco: Former  ?  Types: Chew  ?  Quit date: 66  ?Vaping Use  ? Vaping Use: Never used  ?Substance and Sexual Activity  ? Alcohol use: Yes  ?  Alcohol/week: 14.0 standard drinks  ?  Types: 14 Standard  drinks or equivalent per week  ? Drug use: No  ? Sexual activity: Not Currently  ?Other Topics Concern  ? Not on file  ?Social History Narrative  ? Not on file  ? ?Social Determinants of Health  ? ?Financial Resource Strain: Not on file  ?Food Insecurity: Not on file  ?Transportation Needs: Not on file  ?Physical Activity: Not on file  ?Stress: Not on file  ?Social Connections: Not on file  ? ?Additional Social History:  ?  ?  ?  ?  ?  ?  ?  ?  ?  ?  ?  ? ?Sleep: Fair ? ?Appetite:  Good ? ?Current Medications: ?Current Facility-Administered Medications  ?Medication Dose Route Frequency Provider Last Rate Last Admin  ? acetaminophen (TYLENOL)  tablet 650 mg  650 mg Oral Q6H PRN White, Patrice L, NP      ? alum & mag hydroxide-simeth (MAALOX/MYLANTA) 200-200-20 MG/5ML suspension 30 mL  30 mL Oral Q4H PRN White, Patrice L, NP      ? chlordiazePOXIDE (LIBRIUM) capsule 5 mg  5 mg Oral BID Briant Cedar, MD   5 mg at 08/29/21 1616  ? desvenlafaxine (PRISTIQ) 24 hr tablet 50 mg  50 mg Oral Daily Hill, Jackie Plum, MD   50 mg at 08/29/21 0900  ? feeding supplement (ENSURE ENLIVE / ENSURE PLUS) liquid 237 mL  237 mL Oral BID BM Massengill, Nathan, MD   237 mL at 08/29/21 1013  ? magnesium hydroxide (MILK OF MAGNESIA) suspension 30 mL  30 mL Oral Daily PRN White, Patrice L, NP      ? melatonin tablet 5 mg  5 mg Oral QHS Hill, Jackie Plum, MD   5 mg at 08/28/21 2119  ? mirtazapine (REMERON) tablet 7.5 mg  7.5 mg Oral QHS Briant Cedar, MD   7.5 mg at 08/28/21 2119  ? multivitamin with minerals tablet 1 tablet  1 tablet Oral Daily White, Patrice L, NP   1 tablet at 08/29/21 0900  ? nicotine polacrilex (NICORETTE) gum 2 mg  2 mg Oral PRN Janine Limbo, MD   2 mg at 08/28/21 2120  ? QUEtiapine (SEROQUEL) tablet 25 mg  25 mg Oral TID PRN Maida Sale, MD   25 mg at 08/27/21 1115  ? thiamine tablet 100 mg  100 mg Oral Daily White, Patrice L, NP   100 mg at 08/29/21 0900  ? Vitamin D  (Ergocalciferol) (DRISDOL) capsule 50,000 Units  50,000 Units Oral Q7 days Maida Sale, MD   50,000 Units at 08/25/21 0813  ? ? ?Lab Results: No results found for this or any previous visit (from the past 4

## 2021-08-29 NOTE — Plan of Care (Signed)
Nurse discussed coping skills, depression and anxiety with patient. ? ?

## 2021-08-29 NOTE — Progress Notes (Signed)
?   08/28/21 2119  ?Psych Admission Type (Psych Patients Only)  ?Admission Status Voluntary  ?Psychosocial Assessment  ?Patient Complaints Anxiety  ?Eye Contact Fair  ?Facial Expression Anxious  ?Affect Appropriate to circumstance  ?Speech Logical/coherent  ?Interaction Assertive  ?Motor Activity Tremors  ?Appearance/Hygiene Unremarkable  ?Behavior Characteristics Cooperative;Appropriate to situation  ?Mood Pleasant  ?Thought Process  ?Coherency WDL  ?Content WDL  ?Delusions None reported or observed  ?Perception WDL  ?Hallucination None reported or observed  ?Judgment WDL  ?Confusion None  ?Danger to Self  ?Current suicidal ideation? Denies  ?Self-Injurious Behavior No self-injurious ideation or behavior indicators observed or expressed   ?Agreement Not to Harm Self Yes  ?Description of Agreement Verbal contract  ?Danger to Others  ?Danger to Others None reported or observed  ? ? ?

## 2021-08-29 NOTE — BH IP Treatment Plan (Signed)
Interdisciplinary Treatment and Diagnostic Plan Update ? ?08/29/2021 ?Time of Session: 10:20am  ?Billy Roman ?MRN: [id number] ? ?Principal Diagnosis: [name], recurrent severe, without psychosis ([place]) ? ?Secondary Diagnoses: Principal Problem: ?  [name], recurrent severe, without psychosis ([place]) ?Active Problems: ?  Complaints of memory disturbance ?  Tremor of both hands ?  Smoker ? ? ?Current Medications:  ?Current Facility-Administered Medications  ?Medication Dose Route Frequency Provider Last Rate Last Admin  ? acetaminophen (TYLENOL) tablet 650 mg  650 mg Oral Q6H PRN White, Patrice L, NP      ? alum & mag hydroxide-simeth (MAALOX/MYLANTA) 200-200-20 MG/5ML suspension 30 mL  30 mL Oral Q4H PRN White, Patrice L, NP      ? chlordiazePOXIDE (LIBRIUM) capsule 5 mg  5 mg Oral BID Briant Cedar, MD   5 mg at 08/29/21 0900  ? desvenlafaxine (PRISTIQ) 24 hr tablet 50 mg  50 mg Oral Daily Hill, Jackie Plum, MD   50 mg at 08/29/21 0900  ? feeding supplement (ENSURE ENLIVE / ENSURE PLUS) liquid 237 mL  237 mL Oral BID BM Massengill, Nathan, MD   237 mL at 08/27/21 1117  ? magnesium hydroxide (MILK OF MAGNESIA) suspension 30 mL  30 mL Oral Daily PRN White, Patrice L, NP      ? melatonin tablet 5 mg  5 mg Oral QHS Hill, Jackie Plum, MD   5 mg at 08/28/21 2119  ? mirtazapine (REMERON) tablet 7.5 mg  7.5 mg Oral QHS Briant Cedar, MD   7.5 mg at 08/28/21 2119  ? multivitamin with minerals tablet 1 tablet  1 tablet Oral Daily White, Patrice L, NP   1 tablet at 08/29/21 0900  ? nicotine polacrilex (NICORETTE) gum 2 mg  2 mg Oral PRN Janine Limbo, MD   2 mg at 08/28/21 2120  ? QUEtiapine (SEROQUEL) tablet 25 mg  25 mg Oral TID PRN Maida Sale, MD   25 mg at 08/27/21 1115  ? thiamine tablet 100 mg  100 mg Oral Daily White, Patrice L, NP   100 mg at 08/29/21 0900  ? Vitamin D (Ergocalciferol) (DRISDOL) capsule 50,000 Units  50,000 Units  Oral Q7 days Maida Sale, MD   50,000 Units at 08/25/21 0813  ? ?PTA Medications: ?No medications prior to admission.  ? ? ?Patient Stressors: Health problems   ?Substance abuse   ? ?Patient Strengths: Ability for insight  ?Capable of independent living  ?Communication skills  ?Financial means  ?Motivation for treatment/growth  ?Supportive family/friends  ?Work skills  ? ?Treatment Modalities: Medication Management, Group therapy, Case management,  ?1 to 1 session with clinician, Psychoeducation, Recreational therapy. ? ? ?Physician Treatment Plan for Primary Diagnosis: [name], recurrent severe, without psychosis ([place]) ?Long Term Goal(s): Improvement in symptoms so as ready for discharge  ? ?Short Term Goals: Ability to identify changes in lifestyle to reduce recurrence of condition will improve ?Ability to verbalize feelings will improve ?Ability to disclose and discuss suicidal ideas ?Ability to demonstrate self-control will improve ?Ability to identify and develop effective coping behaviors will improve ?Ability to maintain clinical measurements within normal limits will improve ?Compliance with prescribed medications will improve ?Ability to identify triggers associated with substance abuse/mental health issues will improve ? ?Medication Management: Evaluate patient's response, side effects, and tolerance of medication regimen. ? ?Therapeutic Interventions: 1 to 1 sessions, Unit Group sessions and Medication administration. ? ?Evaluation of Outcomes: Progressing ? ?Physician Treatment Plan  for Secondary Diagnosis: Principal Problem: ?  [name], recurrent severe, without psychosis ([place]) ?Active Problems: ?  Complaints of memory disturbance ?  Tremor of both hands ?  Smoker ? ?Long Term Goal(s): Improvement in symptoms so as ready for discharge  ? ?Short Term Goals: Ability to identify changes in lifestyle to reduce recurrence of condition will  improve ?Ability to verbalize feelings will improve ?Ability to disclose and discuss suicidal ideas ?Ability to demonstrate self-control will improve ?Ability to identify and develop effective coping behaviors will improve ?Ability to maintain clinical measurements within normal limits will improve ?Compliance with prescribed medications will improve ?Ability to identify triggers associated with substance abuse/mental health issues will improve    ? ?Medication Management: Evaluate patient's response, side effects, and tolerance of medication regimen. ? ?Therapeutic Interventions: 1 to 1 sessions, Unit Group sessions and Medication administration. ? ?Evaluation of Outcomes: Progressing ? ? ?RN Treatment Plan for Primary Diagnosis: [name], recurrent severe, without psychosis ([place]) ?Long Term Goal(s): Knowledge of disease and therapeutic regimen to maintain health will improve ? ?Short Term Goals: Ability to remain free from injury will improve, Ability to participate in decision making will improve, Ability to verbalize feelings will improve, Ability to disclose and discuss suicidal ideas, and Ability to identify and develop effective coping behaviors will improve ? ?Medication Management: RN will administer medications as ordered by provider, will assess and evaluate patient's response and provide education to patient for prescribed medication. RN will report any adverse and/or side effects to prescribing provider. ? ?Therapeutic Interventions: 1 on 1 counseling sessions, Psychoeducation, Medication administration, Evaluate responses to treatment, Monitor vital signs and CBGs as ordered, Perform/monitor CIWA, COWS, AIMS and Fall Risk screenings as ordered, Perform wound care treatments as ordered. ? ?Evaluation of Outcomes: Progressing ? ? ?LCSW Treatment Plan for Primary Diagnosis: [name], recurrent severe, without psychosis ([place]) ?Long Term Goal(s): Safe transition  to appropriate next level of care at discharge, Engage patient in therapeutic group addressing interpersonal concerns. ? ?Short Term Goals: Engage patient in aftercare planning with referrals and resources, Increase social support, Increase emotional regulation, Facilitate acceptance of mental health diagnosis and concerns, Identify triggers associated with mental health/substance abuse issues, and Increase skills for wellness and recovery ? ?Therapeutic Interventions: Assess for all discharge needs, 1 to 1 time with Education officer, museum, Explore available resources and support systems, Assess for adequacy in community support network, Educate family and significant other(s) on suicide prevention, Complete Psychosocial Assessment, Interpersonal group therapy. ? ?Evaluation of Outcomes: Progressing ? ? ?Progress in Treatment: ?Attending groups: Yes. ?Participating in groups: Yes. ?Taking medication as prescribed: Yes. ?Toleration medication: Yes. ?Family/Significant other contact made: No, will contact:  CSW will assess and contact support identified, Daughter contacted with patient  ?Patient understands diagnosis: Yes. ?Discussing patient identified problems/goals with staff: Yes. ?Medical problems stabilized or resolved: Yes. ?Denies suicidal/homicidal ideation: Yes. ?Issues/concerns per patient self-inventory: No. ?Other: none ?  ?New problem(s) identified: No, Describe:  none reported ?  ?New Short Term/Long Term Goal(s): medication stabilization, elimination of SI thoughts, development of comprehensive mental wellness plan.  ?  ?Patient Goals:  "I want to be able to focus again" ? ?Discharge Plan or Barriers: Patient recently admitted. CSW will continue to follow and assess for appropriate referrals and possible discharge planning.  ? ?Reason for Continuation of Hospitalization: Anxiety ?Depression ?Medication stabilization ?Suicidal ideation ?  ?Estimated Length of Stay: 3-5 days ? ? ?Scribe for Treatment Team: ?Frutoso Chase  Dahlia Client, LCSWA ?08/29/2021 ?3:39 PM ?

## 2021-08-29 NOTE — Group Note (Signed)
LCSW Group Therapy Note ? ?Group Date: 08/29/2021 ?Start Time: 1300 ?End Time: 1400 ? ? ?Type of Therapy and Topic:  Group Therapy: Anger Cues and Responses ? ?Participation Level:  Active ? ? ?Description of Group:   ?In this group, patients learned how to recognize the physical, cognitive, emotional, and behavioral responses they have to anger-provoking situations.  They identified a recent time they became angry and how they reacted.  They analyzed how their reaction was possibly beneficial and how it was possibly unhelpful.  The group discussed a variety of healthier coping skills that could help with such a situation in the future.  Focus was placed on how helpful it is to recognize the underlying emotions to our anger, because working on those can lead to a more permanent solution as well as our ability to focus on the important rather than the urgent. ? ?Therapeutic Goals: ?Patients will remember their last incident of anger and how they felt emotionally and physically, what their thoughts were at the time, and how they behaved. ?Patients will identify how their behavior at that time worked for them, as well as how it worked against them. ?Patients will explore possible new behaviors to use in future anger situations. ?Patients will learn that anger itself is normal and cannot be eliminated, and that healthier reactions can assist with resolving conflict rather than worsening situations. ? ?Summary of Patient Progress:  The Pt was active during the group. They shared their feelings about what leads them to anger and what helps them to cope with anger. They demonstrated insight into the subject matter, was respectful of peers, and participated throughout the entire session. ? ?Therapeutic Modalities:   ?Cognitive Behavioral Therapy ? ?Darleen Crocker, LCSWA ?08/29/2021  1:56 PM   ? ?

## 2021-08-29 NOTE — Group Note (Signed)
Recreation Therapy Group Note ? ? ?Group Topic:Stress Management  ?Group Date: 08/29/2021 ?Start Time: 0930 ?End Time: 0932 ?Facilitators: Victorino Sparrow, LRT,CTRS ?Location: [place] ? ? ?Goal Area(s) Addresses:  ?Patient will actively participate in stress management techniques presented during session.  ?Patient will successfully identify benefit of practicing stress management post d/c.  ?  ?Group Description: Guided Imagery. LRT provided education, instruction, and demonstration on practice of visualization via guided imagery. Patient was asked to participate in the technique introduced during session. LRT debriefed including topics of mindfulness, stress management and specific scenarios each patient could use these techniques. Patients were given suggestions of ways to access scripts post d/c and encouraged to explore Youtube and other apps available on smartphones, tablets, and computers. ? ? ?Affect/Mood: N/A ?  ?Participation Level: Did not attend ?  ? ?Clinical Observations/Individualized Feedback:   ? ? ?Plan: Continue to engage patient in RT group sessions 2-3x/week. ? ? ?Victorino Sparrow, LRT,CTRS ?08/29/2021 12:03 PM ?

## 2021-08-29 NOTE — Plan of Care (Signed)
MOCA score administered 3/15- ?Visual spatial/executive: 4/5 ?Naming: 3/3 ?Attention: 6/6 ?Digits-2/2, letters-1/1, subtraction-3/3 ?Language: 1/3 ?Repeat-1/2, fluency-0/1 ?Abstraction: 2/2 ?Delayed recall: 2/5 ?Orientation: 4/6 ?Total: 22 out of 30 Data processing manager) ? ? ?Fatima Sanger MD ?Resident ? ?

## 2021-08-30 MED ORDER — CHLORDIAZEPOXIDE HCL 5 MG PO CAPS
5.0000 mg | ORAL_CAPSULE | Freq: Once | ORAL | Status: AC
  Administered 2021-08-31: 5 mg via ORAL
  Filled 2021-08-30: qty 1

## 2021-08-30 NOTE — Progress Notes (Signed)
? ? ?   08/29/21 2125  ?Psych Admission Type (Psych Patients Only)  ?Admission Status Voluntary  ?Psychosocial Assessment  ?Patient Complaints Anxiety;Depression  ?Eye Contact Fair  ?Facial Expression Anxious  ?Affect Appropriate to circumstance  ?Speech Logical/coherent  ?Interaction Assertive  ?Motor Activity Tremors  ?Appearance/Hygiene Unremarkable  ?Behavior Characteristics Cooperative  ?Mood Depressed;Anxious  ?Thought Process  ?Coherency WDL  ?Content WDL  ?Delusions None reported or observed  ?Perception WDL  ?Hallucination None reported or observed  ?Judgment WDL  ?Confusion None  ?Danger to Self  ?Current suicidal ideation? Denies  ?Self-Injurious Behavior No self-injurious ideation or behavior indicators observed or expressed   ?Agreement Not to Harm Self Yes  ?Description of Agreement verbal contract for safety  ?Danger to Others  ?Danger to Others None reported or observed  ? ? ?

## 2021-08-30 NOTE — [facility] Suicide Risk Assessment (Signed)
[facility] INPATIENT:  Family/Significant Other Suicide Prevention Education ? ?Suicide Prevention Education:  ?Education Completed; Billy Roman,  (daughter) has been identified by the patient as the family member/significant other with whom the patient will be residing, and identified as the person(s) who will aid the patient in the event of a mental health crisis (suicidal ideations/suicide attempt).  With written consent from the patient, the family member/significant other has been provided the following suicide prevention education, prior to the and/or following the discharge of the patient. ? ?The suicide prevention education provided includes the following: ?Suicide risk factors ?Suicide prevention and interventions ?National Suicide Hotline telephone number ?[facility] assessment telephone number ?[facility] Emergency Assistance 911 ?[place] and/or Residential Mobile Crisis Unit telephone number ? ?Request made of family/significant other to: ?Remove weapons (e.g., guns, rifles, knives), all items previously/currently identified as safety concern.   ?Remove drugs/medications (over-the-counter, prescriptions, illicit drugs), all items previously/currently identified as a safety concern. ? ?The family member/significant other verbalizes understanding of the suicide prevention education information provided.  The family member/significant other agrees to remove the items of safety concern listed above. ? ?Writer confirms that the patients daughter has agreed to remove guns from the patient's residence later tonight 08/30/2021.  ? ?Billy Roman ?08/30/2021, 4:23 PM ?

## 2021-08-30 NOTE — Progress Notes (Addendum)
[facility] MD Progress Note ? ?08/30/2021 2:35 PM ?[name]  ?MRN:  [id number] ? ?Subjective:   ?Billy Roman is a 61 yr old male who presented on 3/8 to [facility] with worsening depression and anxiety and SI, he was admitted to [facility] on 3/10.  PPHx is significant for Depression and a previous hospitalization [facility] in [phone]). ? ? ?Case was discussed in the multidisciplinary team. MAR was reviewed and patient was compliant with medications.  He received no PRN medications.  ? ? ?Psychiatric Team made the following recommendations yesterday: ?-Continue Pristiq 50 mg daily for mood ?-Continue Remeron 7.5 mg QHS for mood and sleep ?-Continue Melatonin 5 mg QHS for sleep ?-Continue Seroquel 25 mg TID PRN anxiety ?-Continue Librium 5 mg BID today and decrease to daily for tomorrow 2 days then stop ? ? ?On interview today patient reports his sleep was better last night and the tech doing safety checks did not slam the door.  He reports his appetite is doing ok.  He reports no SI, HI, or AVH.  He reports no Paranoia, Ideas of Reference, or other First Rank symptoms.  He reports no issues with his medications.  ? ?He reports that his mood has been improving.  He was able to laugh yesterday and has been interacting more on the unit with other patients and states that prior to admission he had not been doing this.  He reports that prior to admission for a few months he had no emotions at all and simply been "flat" all day, everyday.  Discussed with him that while he is already seeing improvement he will continue to improve because it takes 4-6 weeks to get maximum benefit from antidepressants.  Discussed that it will be crucial for Social Work to [place] with his daughter especially to discuss the guns he owns.  He reports understanding and reports that his daughter visited him last night and it went well.  He reports he is interested in learning more about PHP and discussed I will Social Work further discuss this with  him.  He reports no other concerns at present. ? ?Principal Problem: [name], recurrent severe, [name] ?Diagnosis: Principal Problem: ?  [name], recurrent severe, [name] ?Active Problems: ?  Complaints of memory disturbance ?  Tremor of both hands ?  Smoker ? ?Total Time spent with patient:  ?I personally spent 30 minutes on the unit in direct patient care. The direct patient care time included face-to-face time with the patient, reviewing the patient's chart, communicating with other professionals, and coordinating care. Greater than 50% of this time was spent in counseling or coordinating care with the patient regarding goals of hospitalization, psycho-education, and discharge planning needs. ? ? ?Past Psychiatric History: Depression and a previous hospitalization ([facility] in [phone]). ? ?Past Medical History:  ?Past Medical History:  ?Diagnosis Date  ? [name], recurrent severe, without psychosis ([place]) 08/23/2021  ?  ?Past Surgical History:  ?Procedure Laterality Date  ? RHINOPLASTY    ? SEPTOPLASTY    ? SHOULDER ARTHROSCOPY Left   ? SHOULDER ARTHROSCOPY WITH OPEN ROTATOR CUFF REPAIR AND DISTAL CLAVICLE ACROMINECTOMY Right 02/01/2021  ? Procedure: RIGHT SHOULDER ARTHROSCOPY WITH MINI OPEN ROTATOR CUFF REPAIR AND DISTAL CLAVICLE EXCISION;  Surgeon: Thornton Park, MD;  Location: [facility] ORS;  Service: Orthopedics;  Laterality: Right;  ? SPINE SURGERY    ? lumbar laminectomy  ? WRIST SURGERY Bilateral   ?  corpal tunnel  ? ?Family History: see H&P ? ?Family Psychiatric  History: Daughter- Depression  ?Otherwise unknown due to adoption ? ?Social History:  ?Social History  ? ?Substance and Sexual Activity  ?Alcohol Use Yes  ? Alcohol/week: 14.0 standard drinks  ? Types: 14 Standard drinks or equivalent per week  ?   ?Social History  ? ?Substance and Sexual Activity  ?Drug Use No  ?  ?Social History  ? ?Socioeconomic  History  ? Marital status: Single  ?  Spouse name: Not on file  ? Number of children: Not on file  ? Years of education: Not on file  ? Highest education level: Not on file  ?Occupational History  ? Not on file  ?Tobacco Use  ? Smoking status: Every Day  ?  Packs/day: 1.00  ?  Years: 30.00  ?  Pack years: 30.00  ?  Types: Cigarettes  ?  Last attempt to quit: 11/21/2020  ?  Years since quitting: 0.7  ? Smokeless tobacco: Former  ?  Types: Chew  ?  Quit date: 19  ?Vaping Use  ? Vaping Use: Never used  ?Substance and Sexual Activity  ? Alcohol use: Yes  ?  Alcohol/week: 14.0 standard drinks  ?  Types: 14 Standard drinks or equivalent per week  ? Drug use: No  ? Sexual activity: Not Currently  ?Other Topics Concern  ? Not on file  ?Social History Narrative  ? Not on file  ? ?Social Determinants of Health  ? ?Financial Resource Strain: Not on file  ?Food Insecurity: Not on file  ?Transportation Needs: Not on file  ?Physical Activity: Not on file  ?Stress: Not on file  ?Social Connections: Not on file  ? ?Sleep: Fair ? ?Appetite:  Good ? ?Current Medications: ?Current Facility-Administered Medications  ?Medication Dose Route Frequency Provider Last Rate Last Admin  ? acetaminophen (TYLENOL) tablet 650 mg  650 mg Oral Q6H PRN White, Patrice L, NP      ? alum & mag hydroxide-simeth (MAALOX/MYLANTA) 200-200-20 MG/5ML suspension 30 mL  30 mL Oral Q4H PRN White, Patrice L, NP      ? [START ON 08/31/2021] chlordiazePOXIDE (LIBRIUM) capsule 5 mg  5 mg Oral Once Briant Cedar, MD      ? desvenlafaxine (PRISTIQ) 24 hr tablet 50 mg  50 mg Oral Daily Hill, Jackie Plum, MD   50 mg at 08/30/21 4650  ? feeding supplement (ENSURE ENLIVE / ENSURE PLUS) liquid 237 mL  237 mL Oral BID BM Massengill, Nathan, MD   237 mL at 08/29/21 1013  ? magnesium hydroxide (MILK OF MAGNESIA) suspension 30 mL  30 mL Oral Daily PRN White, Patrice L, NP      ? melatonin tablet 5 mg  5 mg Oral QHS Hill, Jackie Plum, MD   5 mg at 08/29/21 2125   ? mirtazapine (REMERON) tablet 7.5 mg  7.5 mg Oral QHS Briant Cedar, MD   7.5 mg at 08/29/21 2125  ? multivitamin with minerals tablet 1 tablet  1 tablet Oral Daily White, Patrice L, NP   1 tablet at 08/30/21 3546  ? nicotine polacrilex (NICORETTE) gum 2 mg  2 mg Oral PRN Janine Limbo, MD   2 mg at 08/30/21 1325  ? QUEtiapine (SEROQUEL) tablet 25 mg  25 mg Oral TID PRN Maida Sale, MD   25 mg at 08/27/21 1115  ? thiamine tablet 100 mg  100 mg Oral Daily White, Patrice L, NP   100  mg at 08/30/21 0370  ? Vitamin D (Ergocalciferol) (DRISDOL) capsule 50,000 Units  50,000 Units Oral Q7 days Maida Sale, MD   50,000 Units at 08/25/21 0813  ? ? ?Lab Results: No results found for this or any previous visit (from the past 48 hour(s)). ? ?Blood Alcohol level:  ?Lab Results  ?Component Value Date  ? ETH 82 (H) 08/22/2021  ? ? ?Metabolic Disorder Labs: ?Lab Results  ?Component Value Date  ? HGBA1C 5.2 08/22/2021  ? MPG 102.54 08/22/2021  ? ?No results found for: PROLACTIN ?Lab Results  ?Component Value Date  ? CHOL 147 08/22/2021  ? TRIG 109 08/22/2021  ? HDL 49 08/22/2021  ? CHOLHDL 3.0 08/22/2021  ? VLDL 22 08/22/2021  ? [place] 76 08/22/2021  ? [place] 91 01/15/2021  ? ? ?Physical Findings: ?CIWA:  CIWA-Ar Total: 0 ? ?Musculoskeletal: ?Strength & Muscle Tone: within normal limits ?Gait & Station: steady, normal ?Patient leans: N/A ? ?Psychiatric Specialty Exam: ? ?Presentation  ?General Appearance: casually dressed, fair hygiene ? ?Eye Contact:Good ? ?Speech:Clear and Coherent; Normal Rate ? ?Speech Volume:Normal ? ?Handedness:Right ? ? ?Mood and Affect  ?Mood:-- ("okay" and improving) ? ?Affect:Congruent ? ? ?Thought Process  ?Thought Processes:ruminative about health issues/job but lessened than yesterday, goal directed for most of interview ? ?Descriptions of Associations:Intact ? ?Orientation:Full (Time, Place and Person) ? ?Thought Content:Logical (still has some rumination about  work/health but less than yesterday) ?No SI, HI, or AVH. No Paranoia, Ideas of Reference, or First Rank symptoms. ? ?History of Schizophrenia/Schizoaffective disorder:No ? ?Hallucinations:Hallucinations:

## 2021-08-30 NOTE — Plan of Care (Signed)
°  Problem: Education: °Goal: Emotional status will improve °Outcome: Progressing °Goal: Mental status will improve °Outcome: Progressing °Goal: Verbalization of understanding the information provided will improve °Outcome: Progressing °  °

## 2021-08-30 NOTE — [facility] Suicide Risk Assessment (Signed)
[facility] INPATIENT:  Family/Significant Other Suicide Prevention Education ? ?Suicide Prevention Education:  ?Contact Attempts: Davidjames Blansett, daughter, [phone]  has been identified by the patient as the family member/significant other with whom the patient will be residing, and identified as the person(s) who will aid the patient in the event of a mental health crisis.  With written consent from the patient, two attempts were made to provide suicide prevention education, prior to and/or following the patient's discharge.  We were unsuccessful in providing suicide prevention education.  A suicide education pamphlet was given to the patient to share with family/significant other. ? ?Patient states his daughter has agreed to secure gun in safe at her home. CSW attempted to reach daughter to confirm.  ? ?CSW attempted to contact daughter Feliz Lincoln in the presence of patient, unable to reach. Left HIPAA compliant voicemail with contact information and callback request. Patient has now signed consent for CSW to reach daughter.  ? ?Date and time of first attempt:08/30/2021 @ 3:42 PM ?  ?Date and time of second attempt: CSW team will make additional attempts to reach daughter to confirm patient's gun has been secured.  ? ?Durenda Hurt ?08/30/2021, 3:42 PM ?

## 2021-08-30 NOTE — Progress Notes (Signed)
Adult Psychoeducational Group Note ? ?Date:  08/30/2021 ?Time:  9:22 PM ? ?Group Topic/Focus:  ?Wrap-Up Group:   The focus of this group is to help patients review their daily goal of treatment and discuss progress on daily workbooks. ? ?Participation Level:  Active ? ?Participation Quality:  Appropriate ? ?Affect:  Appropriate ? ?Cognitive:  Appropriate ? ?Insight: Appropriate ? ?Engagement in Group:  Engaged ? ?Modes of Intervention:  Discussion ? ?Additional Comments:  attend wrap up group ? ?Lenice Llamas Long ?08/30/2021, 9:22 PM ?

## 2021-08-30 NOTE — Plan of Care (Signed)
?  Problem: Coping: ?Goal: Ability to verbalize frustrations and anger appropriately will improve ?Outcome: Progressing ?Goal: Ability to demonstrate self-control will improve ?Outcome: Progressing ?  ?Problem: Education: ?Goal: Ability to make informed decisions regarding treatment will improve ?Outcome: Progressing ?  ?Problem: Coping: ?Goal: Coping ability will improve ?Outcome: Progressing ?  ?

## 2021-08-30 NOTE — Progress Notes (Signed)
?   08/30/21 2951  ?Psych Admission Type (Psych Patients Only)  ?Admission Status Voluntary  ?Psychosocial Assessment  ?Patient Complaints Anxiety;Depression  ?Eye Contact Fair  ?Facial Expression Anxious  ?Affect Appropriate to circumstance  ?Speech Logical/coherent  ?Interaction Assertive  ?Motor Activity Tremors  ?Appearance/Hygiene Unremarkable  ?Behavior Characteristics Cooperative  ?Mood Depressed;Anxious  ?Thought Process  ?Coherency WDL  ?Content WDL  ?Delusions None reported or observed  ?Perception WDL  ?Hallucination None reported or observed  ?Judgment WDL  ?Confusion WDL  ?Danger to Self  ?Current suicidal ideation? Denies  ?Self-Injurious Behavior No self-injurious ideation or behavior indicators observed or expressed   ?Agreement Not to Harm Self Yes  ?Description of Agreement Verbal contract for safety  ?Danger to Others  ?Danger to Others None reported or observed  ? ?Patient is alert and oriented  to place, person and situation, pleasant and polite on approach. Patient states to being fatigue during shift assessment. Patient is complaint with scheduled medication and well tolerated  by him with no side effect noted. He denies SI/HI/AVH.congruent affect and mood noted. Patient is seen in the day room attending group and socializing with peers. No distress noted at this time. Staff will continue to support and encourage patient to reach goals. ?

## 2021-08-31 MED ORDER — NICOTINE POLACRILEX 2 MG MT GUM: 2.0000 mg | CHEWING_GUM | OROMUCOSAL | 0 refills | Status: DC | PRN

## 2021-08-31 MED ORDER — MELATONIN 5 MG PO TABS: 5.0000 mg | ORAL_TABLET | Freq: Every day | ORAL | 0 refills | Status: DC

## 2021-08-31 MED ORDER — DESVENLAFAXINE SUCCINATE ER 50 MG PO TB24: 50.0000 mg | ORAL_TABLET | Freq: Every day | ORAL | 0 refills | Status: DC

## 2021-08-31 MED ORDER — MIRTAZAPINE 7.5 MG PO TABS: 7.5000 mg | ORAL_TABLET | Freq: Every day | ORAL | 0 refills | Status: DC

## 2021-08-31 MED ORDER — VITAMIN D (ERGOCALCIFEROL) 1.25 MG (50000 UNIT) PO CAPS: 50000.0000 [IU] | ORAL_CAPSULE | ORAL | 0 refills | Status: DC
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
## 2021-08-31 NOTE — Progress Notes (Signed)
Discharge note: RN met with pt and reviewed pt's discharge instructions. Pt verbalized understanding of discharge instructions and pt did not have any questions. RN reviewed and provided pt with a copy of SRA, AVS and Transition Record. RN returned pt's belongings to pt. Pt denied SI/HI/AVH and voiced no concerns. Pt was appreciative of the care pt received at [facility]. Patient discharged to the lobby without incident.  ? 08/31/21 0742  ?Psych Admission Type (Psych Patients Only)  ?Admission Status Voluntary  ?Psychosocial Assessment  ?Patient Complaints Anxiety;Depression  ?Eye Contact Fair  ?Facial Expression Flat  ?Affect Appropriate to circumstance  ?Speech Logical/coherent  ?Interaction Assertive  ?Motor Activity Tremors  ?Appearance/Hygiene Unremarkable  ?Behavior Characteristics Cooperative;Appropriate to situation;Calm  ?Mood Anxious;Depressed  ?Thought Process  ?Coherency WDL  ?Content WDL  ?Delusions None reported or observed  ?Perception WDL  ?Hallucination None reported or observed  ?Judgment Impaired  ?Confusion None  ?Danger to Self  ?Current suicidal ideation? Denies  ?Danger to Others  ?Danger to Others None reported or observed  ? ? ?

## 2021-08-31 NOTE — Discharge Summary (Addendum)
Physician Discharge Summary Note ? ?Patient:  Billy Roman is an 61 y.o., male ?MRN:  [id number] ?DOB:  [birth year] ?Patient phone:  [phone] (home)  ?Patient address:   ?[address] ?[place] [address],  ?Total Time spent with patient: 30 minutes ? ?Date of Admission:  08/23/2021 ?Date of Discharge: 08/31/2021 ? ?Reason for Admission:   ?[name] is a 61 YO M with a history of depression who presents with complaints of poor focus, "brain fog", anxiety, depression and suicidal ruminations in the setting of job stress, recent divorce, alcohol use and recent rotator cuff surgery. He has noticed that he has been depressed and had some "brain fog" a bit since covid in 2020. He is worried that there is something wrong with his memory and he wont be able to perform on the job and he wont be able to work anymore. He currently drinks 2 strong drinks on most weekdays and up to 9 on weekends. He is under a lot of stress. He is recently divorced and his 4th grandchild is on the way. He has limited social supports and spends most of his time isolating at home. He was prescribed zoloft but he is not taking it. He denies hallucinations but he has very lucid and vivid dreams. He is worried that he cannot perform at work. He was recently out for rotator cuff surgery and when he returned he was demoted, so he was switched to another department. He found that he just couldn't learn the new material, and his anxiety was very high. Since then he developed SI and has since come here.  ? ?Principal Problem: [name], recurrent severe, without psychosis ([place]) ?Discharge Diagnoses: Principal Problem: ?  [name], recurrent severe, without psychosis ([place]) ?Active Problems: ?  Complaints of memory disturbance ?  Tremor of both hands ?  Smoker ? ? ?Past Psychiatric History:  Depression and a previous hospitalization ([facility] in [phone]). ? ?Past Medical History:  ?Past Medical  History:  ?Diagnosis Date  ? [name], recurrent severe, without psychosis ([place]) 08/23/2021  ?  ?Past Surgical History:  ?Procedure Laterality Date  ? RHINOPLASTY    ? SEPTOPLASTY    ? SHOULDER ARTHROSCOPY Left   ? SHOULDER ARTHROSCOPY WITH OPEN ROTATOR CUFF REPAIR AND DISTAL CLAVICLE ACROMINECTOMY Right 02/01/2021  ? Procedure: RIGHT SHOULDER ARTHROSCOPY WITH MINI OPEN ROTATOR CUFF REPAIR AND DISTAL CLAVICLE EXCISION;  Surgeon: Thornton Park, MD;  Location: [facility] ORS;  Service: Orthopedics;  Laterality: Right;  ? SPINE SURGERY    ? lumbar laminectomy  ? WRIST SURGERY Bilateral   ? corpal tunnel  ? ?Family History: History reviewed. No pertinent family history. ?Family Psychiatric  History: Daughter- Depression  ?Otherwise unknown due to adoption ?Social History:  ?Social History  ? ?Substance and Sexual Activity  ?Alcohol Use Yes  ? Alcohol/week: 14.0 standard drinks  ? Types: 14 Standard drinks or equivalent per week  ?   ?Social History  ? ?Substance and Sexual Activity  ?Drug Use No  ?  ?Social History  ? ?Socioeconomic History  ? Marital status: Single  ?  Spouse name: Not on file  ? Number of children: Not on file  ? Years of education: Not on file  ? Highest education level: Not on file  ?Occupational History  ? Not on file  ?Tobacco Use  ? Smoking status: Every Day  ?  Packs/day: 1.00  ?  Years: 30.00  ?  Pack years: 30.00  ?  Types: Cigarettes  ?  Last attempt to quit: 11/21/2020  ?  Years since quitting: 0.7  ? Smokeless tobacco: Former  ?  Types: Chew  ?  Quit date: 50  ?Vaping Use  ? Vaping Use: Never used  ?Substance and Sexual Activity  ? Alcohol use: Yes  ?  Alcohol/week: 14.0 standard drinks  ?  Types: 14 Standard drinks or equivalent per week  ? Drug use: No  ? Sexual activity: Not Currently  ?Other Topics Concern  ? Not on file  ?Social History Narrative  ? Not on file  ? ?Social Determinants of Health  ? ?Financial Resource Strain: Not on file  ?Food Insecurity: Not on file   ?Transportation Needs: Not on file  ?Physical Activity: Not on file  ?Stress: Not on file  ?Social Connections: Not on file  ? ? ?Hospital Course:   ?During the patient's hospitalization, patient had extensive initial psychiatric evaluation, and follow-up psychiatric evaluations every day. ? ?Psychiatric diagnoses provided upon initial assessment: MDD, Recurrent, Severe, w/o Psychosis ? ?Patient's psychiatric medications were adjusted on admission:  Started on Pristiq '50mg'$  and a Librium taper for potential alcohol withdrawal ? ?During the hospitalization, other adjustments were made to the patient's psychiatric medication regimen: He was trialed on Seroquel for his sleep issues and this was stopped and he was started on Remeron 7.'5mg'$  which did improve his sleep.  He was found to be deficient in Vit D and started on supplementation. He completed a librium taper without acute evidence of alcohol withdrawal. He was started on Melatonin '5mg'$  qhs for sleep. Pristiq was continued at '50mg'$  daily. ? ?Patient's care was discussed during the interdisciplinary team meeting every day during the hospitalization. ? ?The patient is not  having side effects to prescribed psychiatric medication. He was referred to PHP/IOP after discharge. ? ?Gradually, patient started adjusting to milieu. The patient was evaluated each day by a clinical provider to ascertain response to treatment. Improvement was noted by the patient's report of decreasing symptoms, improved sleep and appetite, affect, medication tolerance, behavior, and participation in unit programming.  Patient was asked each day to complete a self inventory noting mood, mental status, pain, new symptoms, anxiety and concerns.   ?Symptoms were reported as significantly decreased or resolved completely by discharge.  ?The patient reports that their mood is stable.  ?The patient denied having suicidal thoughts for more than 48 hours prior to discharge.  Patient denies having  homicidal thoughts.  Patient denies having auditory hallucinations.  Patient denies any visual hallucinations or other symptoms of psychosis.  ?The patient was motivated to continue taking medication with a goal of continued improvement in mental health.  ? ?The patient reports their target psychiatric symptoms of depression responded  to the psychiatric medications, and the patient reports overall benefit other psychiatric hospitalization. Supportive psychotherapy was provided to the patient. The patient also participated in regular group therapy while hospitalized. Coping skills, problem solving as well as relaxation therapies were also part of the unit programming. ? ?Labs were reviewed with the patient, and abnormal results were discussed with the patient. ? ?The patient is able to verbalize their individual safety plan to this provider. ? ?# It is recommended to the patient to continue psychiatric medications as prescribed, after discharge from the hospital.   ? ?# It is recommended to the patient to follow up with your outpatient psychiatric provider and PCP. ? ?# It was discussed with the patient, the impact of alcohol, drugs, tobacco have  been there overall psychiatric and medical wellbeing, and total abstinence from substance use was recommended the patient.ed. ? ?# Prescriptions provided or sent directly to preferred pharmacy at discharge. Patient agreeable to plan. Given opportunity to ask questions. Appears to feel comfortable with discharge.  ?  ?# In the event of worsening symptoms, the patient is instructed to call the crisis hotline, 911 and or go to the nearest ED for appropriate evaluation and treatment of symptoms. To follow-up with primary care provider for other medical issues, concerns and or health care needs ? ?# Patient was discharged home with a plan to follow up as noted below. ? ? ? ?On day of discharge he reports he is doing better.  He reports no SI, HI, or AVH.  He reports no [place],  Ideas of Reference, or other [facility] [place] symptoms.  He reports no issues with his medications.  Discussed with him the need to follow up with his PCP to monitor his 1st degree AV block.  Also discussed the need

## 2021-08-31 NOTE — Group Note (Signed)
LCSW Group Therapy Note ? ? ?Group Date: 08/31/2021 ?Start Time: 1300 ?End Time: 1400 ? ?Type of Therapy and Topic:  Group Therapy:  Healthy and Unhealthy Supports ? ?Participation Level:  Did not attend  ? ?Description of Group:  Patients in this group were introduced to the idea of adding a variety of healthy supports to address the various needs in their lives, especially in reference to their plans and focus for the new year.  Patients discussed what additional healthy supports could be helpful in their recovery and wellness after discharge in order to prevent future hospitalizations.   An emphasis was placed on using counselor, doctor, therapy groups, 12-step groups, and problem-specific support groups to expand supports.   ? ?Therapeutic Goals: ? ? 1)  discuss importance of adding supports to stay well once out of the hospital ? 2)  compare healthy versus unhealthy supports and identify some examples of each ? 3)  generate ideas and descriptions of healthy supports that can be added ? 4)  offer mutual support about how to address unhealthy supports ? 5)  encourage active participation in and adherence to discharge plan ?  ? ?Summary of Patient Progress:  Did not attend  ? ?Darleen Crocker, LCSWA ?08/31/2021  2:05 PM   ? ?

## 2021-08-31 NOTE — Group Note (Signed)
Recreation Therapy Group Note ? ? ?Group Topic:Stress Management  ?Group Date: 08/31/2021 ?Start Time: 79 ?End Time: 0950 ?Facilitators: Victorino Sparrow, LRT,CTRS ?Location: [place] [facility] ? ? ?Goal Area(s) Addresses:  ?Patient will identify positive stress management techniques. ?Patient will identify benefits of using stress management post d/c. ? ?Group Description:  Meditation.  LRT played a meditation that focused on having gratitude for what you have and what you have to offer to the world instead of focusing on the things you lack.  Patients were to listen and follow along as meditation played to engage in the activity. ? ? ?Affect/Mood: N/A ?  ?Participation Level: Did not attend ?  ? ?Clinical Observations/Individualized Feedback:   ? ? ?Plan: Continue to engage patient in RT group sessions 2-3x/week. ? ? ?Victorino Sparrow, LRT,CTRS ?08/31/2021 10:51 AM ?

## 2021-08-31 NOTE — Plan of Care (Signed)
°  Problem: Education: °Goal: Emotional status will improve °Outcome: Progressing °Goal: Mental status will improve °Outcome: Progressing °Goal: Verbalization of understanding the information provided will improve °Outcome: Progressing °  °

## 2021-08-31 NOTE — Group Note (Signed)
Date:  08/31/2021 ?Time:  10:06 AM ? ?Group Topic/Focus:  ?Orientation:   The focus of this group is to educate the patient on the purpose and policies of crisis stabilization and provide a format to answer questions about their admission.  The group details unit policies and expectations of patients while admitted. ? ? ? ?Participation Level:  Did Not Attend ? ?Participation Quality:   ? ?Affect:   ? ?Cognitive:   ? ?Insight:  ? ?Engagement in Group:   ? ?Modes of Intervention:   ? ?Additional Comments:   ? ?Garvin Fila ?08/31/2021, 10:06 AM ? ?

## 2021-08-31 NOTE — Progress Notes (Signed)
?  [facility] Adult Case Management Discharge Plan : ? ?Will you be returning to the same living situation after discharge:  Yes,  to home ?At discharge, do you have transportation home?: Yes,  daughter  to pick this patient up ?Do you have the ability to pay for your medications: Yes,  has insurance ? ?Release of information consent forms completed and in the chart;  Patient's signature needed at discharge. ? ?Patient to Follow up at: ? Follow-up Information   ? ? [facility] Follow up.   ?Why: established for therapy services ?Contact information: ?[address], [place], [place] [address] ? ?Phone: [phone] ? ?  ?  ? ? [place]. Go on 09/06/2021.   ?Why: You have an appointment for medication management services on 09/06/21 at  3:00 pm.  This appointment will be held in person. ?Contact information: ?[address] ?[place] [address] ?[phone] ? ? ?  ?  ? ? [facility], [place] on 09/04/2021.   ?Specialty: Behavioral Health ?Why: Please present for you assessment appointment on Tuesday 3/21 at 12PM. Location [facility] [address] (three story located in the back of the campus). Check in with reception and wait to be assessed. ?Contact information: ?[address] ?[place] [address] ?[phone] ? ? ?  ?  ? ?  ?  ? ?  ? ? ?Next level of care provider has access to [facility] ? ?Safety Planning and Suicide Prevention discussed: Yes,  with daughter ? ?  ? ?Has patient been referred to the Quitline?: Patient refused referral ? ?Patient has been referred for addiction treatment: Yes. Patient will be receiving SAIOP from [facility] at discharge ? ?Vassie Moselle, LCSW ?08/31/2021, 9:18 AM ?

## 2021-09-05 DIAGNOSIS — F332 Major depressive disorder, recurrent severe without psychotic features: Secondary | ICD-10-CM | POA: Diagnosis not present

## 2021-09-06 ENCOUNTER — Telehealth (HOSPITAL_COMMUNITY): Payer: Self-pay | Admitting: Family

## 2021-09-06 DIAGNOSIS — F332 Major depressive disorder, recurrent severe without psychotic features: Secondary | ICD-10-CM | POA: Diagnosis not present

## 2021-09-06 DIAGNOSIS — F1994 Other psychoactive substance use, unspecified with psychoactive substance-induced mood disorder: Secondary | ICD-10-CM | POA: Diagnosis not present

## 2021-09-06 DIAGNOSIS — F331 Major depressive disorder, recurrent, moderate: Secondary | ICD-10-CM | POA: Diagnosis not present

## 2021-09-06 NOTE — BH Assessment (Signed)
Care Management - Follow Up [place] Discharges  ? ?Patient has been placed in an inpatient psychiatric hospital ([facility]) on 08-23-2021 ?

## 2021-09-07 DIAGNOSIS — F332 Major depressive disorder, recurrent severe without psychotic features: Secondary | ICD-10-CM | POA: Diagnosis not present

## 2021-09-10 DIAGNOSIS — F332 Major depressive disorder, recurrent severe without psychotic features: Secondary | ICD-10-CM | POA: Diagnosis not present

## 2021-09-11 NOTE — Progress Notes (Signed)
? ? ?Patient ID: Billy Roman, male    DOB: 1960-10-18  MRN: [id number] ? ?CC: Hospital Discharge Follow-Up ? ?Subjective: ?Billy Roman is a 61 y.o. male who presents for hospital discharge follow-up. ? ?His concerns today include:  ? ?HOSPITAL DISCHARGE FOLLOW-UP: ?Admitted to [facility] 08/23/2021 - 08/31/2021. Since then bilateral upper extremity tremors continuing. Reports noticed this prior to hospital admission but has disregarded. Now having muscle spasms of the left upper extremity. Recently reports others has noticed head tremors. Also, having memory loss and bilateral joint pain of knees. Denies illicit substance use. Reports has an occasional alcohol beverage. Has appointment 10/25/2021 with Neurology but would like sooner appointment. Trying to return to work soon. 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  ? ?Stomach pain comes and goes. Bowel movement consistency varies from loose to small pieces. Denies blood in stool, nausea, vomiting and additional red flag symptoms. ? ?Reports had sleep study years ago. States was told because he only stopped breathing 5 times at night instead of 6 times he was not a candidate for CPAP. ? ?In regards to psychiatric diagnoses he is seeing Steffanie Rainwater at [facility]. Also, established with [facility] and [facility]. Denies thoughts of self-harm, suicidal ideations, and homicidal ideations.  ? ? ?Patient Active Problem List  ? Diagnosis Date Noted  ? Complaints of memory disturbance 08/24/2021  ? Tremor of both hands 08/24/2021  ? H/O rotator cuff surgery 08/24/2021  ? History of COVID-19 08/24/2021  ? Smoker 08/24/2021  ? [name], recurrent severe, without psychosis ([place]) 08/23/2021  ? Stiffness of right shoulder joint 02/09/2021  ? Pain in joint of right shoulder 02/09/2021  ? Vitamin D deficiency 01/17/2021   ? HNP (herniated nucleus pulposus), lumbar 01/15/2021  ? Sciatica 01/15/2021  ? Anxiety and depression 08/23/2009  ?  ? ?Current Outpatient Medications on File Prior to Visit  ?Medication Sig Dispense Refill  ? desvenlafaxine (PRISTIQ) 50 MG 24 hr tablet Take 1 tablet (50 mg total) by mouth daily. 30 tablet 0  ? doxepin (SINEQUAN) 10 MG capsule Take 10 mg by mouth at bedtime.    ? nicotine polacrilex (NICORETTE) 2 MG gum Take 1 each (2 mg total) by mouth as needed for smoking cessation. 20 tablet 0  ? traZODone (DESYREL) 100 MG tablet Take 100 mg by mouth at bedtime.    ? Vitamin D, Ergocalciferol, (DRISDOL) 1.25 MG (50000 UNIT) CAPS capsule Take 1 capsule (50,000 Units total) by mouth every 7 (seven) days. 5 capsule 0  ? ?No current facility-administered medications on file prior to visit.  ? ? ?Allergies  ?Allergen Reactions  ? Propyphenazone Anaphylaxis  ?  Felt hot  ? ? ?Social History  ? ?Socioeconomic History  ? Marital status: Single  ?  Spouse name: Not on file  ? Number of children: Not on file  ? Years of education: Not on file  ? Highest education level: Not on file  ?Occupational History  ? Not on file  ?Tobacco Use  ? Smoking status: Every Day  ?  Packs/day: 1.00  ?  Years: 30.00  ?  Pack years: 30.00  ?  Types: Cigarettes  ?  Last attempt to quit: 11/21/2020  ?  Years since quitting: 0.8  ? Smokeless tobacco: Former  ?  Types: Chew  ?  Quit date: 48  ?Vaping Use  ? Vaping Use: Never used  ?  Substance and Sexual Activity  ? Alcohol use: Yes  ?  Alcohol/week: 14.0 standard drinks  ?  Types: 14 Standard drinks or equivalent per week  ? Drug use: No  ? Sexual activity: Not Currently  ?Other Topics Concern  ? Not on file  ?Social History Narrative  ? Not on file  ? ?Social Determinants of Health  ? ?Financial Resource Strain: Not on file  ?Food Insecurity: Not on file  ?Transportation Needs: Not on file  ?Physical Activity: Not on file  ?Stress: Not on file  ?Social Connections: Not on file  ?Intimate  Partner Violence: Not on file  ? ? ?No family history on file. ? ?Past Surgical History:  ?Procedure Laterality Date  ? RHINOPLASTY    ? SEPTOPLASTY    ? SHOULDER ARTHROSCOPY Left   ? SHOULDER ARTHROSCOPY WITH OPEN ROTATOR CUFF REPAIR AND DISTAL CLAVICLE ACROMINECTOMY Right 02/01/2021  ? Procedure: RIGHT SHOULDER ARTHROSCOPY WITH MINI OPEN ROTATOR CUFF REPAIR AND DISTAL CLAVICLE EXCISION;  Surgeon: Thornton Park, MD;  Location: [facility] ORS;  Service: Orthopedics;  Laterality: Right;  ? SPINE SURGERY    ? lumbar laminectomy  ? WRIST SURGERY Bilateral   ? corpal tunnel  ? ? ?ROS: ?Review of Systems ?Negative except as stated above ? ?PHYSICAL EXAM: ?BP 136/83 (BP Location: Left Arm, Patient Position: Sitting, Cuff Size: Large)   Pulse 74   Temp 98.3 ?F (36.8 ?C)   Resp 18   Ht 6' 0.8" (1.849 m)   Wt 220 lb (99.8 kg)   SpO2 98%   BMI 29.19 kg/m?  ? ?Physical Exam ?HENT:  ?   Head: Normocephalic and atraumatic.  ?Eyes:  ?   Extraocular Movements: Extraocular movements intact.  ?   Conjunctiva/sclera: Conjunctivae normal.  ?   Pupils: Pupils are equal, round, and reactive to light.  ?Cardiovascular:  ?   Rate and Rhythm: Normal rate and regular rhythm.  ?   Pulses: Normal pulses.  ?   Heart sounds: Normal heart sounds.  ?Pulmonary:  ?   Effort: Pulmonary effort is normal.  ?   Breath sounds: Normal breath sounds.  ?Musculoskeletal:  ?   Cervical back: Normal range of motion and neck supple.  ?   Comments: Bilateral upper extremities intermittent tremors.   ?Neurological:  ?   General: No focal deficit present.  ?   Mental Status: He is alert and oriented to person, place, and time.  ?Psychiatric:     ?   Mood and Affect: Mood is anxious.  ? ?ASSESSMENT AND PLAN: ?[facility] discharge follow-up: ?- Reviewed hospital course, current medications, ensured proper follow-up in place, and addressed concerns.  ? ?2. Tremor of both hands: ?3. Memory loss: ?- Keep appointment scheduled 10/25/2021 with Marcial Pacas, MD at  Neurology.  ?- An updated referral placed requesting appointment earlier than May 2023. Discussed with patient to keep current appointment until then. ?- Ambulatory referral to Neurology ? ?4. Chronic abdominal pain: ?5. Colon cancer screening: ?- Referral to Gastroenterology for colon cancer screening by colonoscopy. ?- Ambulatory referral to Gastroenterology ? ?6. Sleep apnea, unspecified type: ?- PSG to screen further for sleep apnea.  ?- PSG Sleep Study; Future ? ?7. 1st degree AV block: ?- Referral to Cardiology for further evaluation and management.  ?- Ambulatory referral to Cardiology ? ?8. [name], recurrent severe, without psychosis ([place]): ?9. Anxiety state: ?- Keep all scheduled appointments with [facility], [place] [facility]. ? ? ?  Patient was given the opportunity to ask questions.  Patient verbalized understanding of the plan and was able to repeat key elements of the plan. Patient was given clear instructions to go to Emergency Department or return to medical center if symptoms don't improve, worsen, or new problems develop.The patient verbalized understanding. ? ? ?Orders Placed This Encounter  ?Procedures  ? Ambulatory referral to Gastroenterology  ? Ambulatory referral to Cardiology  ? Ambulatory referral to Neurology  ? PSG Sleep Study  ? ? ? ?Requested Prescriptions  ? ? No prescriptions requested or ordered in this encounter  ? ?Follow-up with primary provider as scheduled.  ? ?Camillia Herter, NP  ?

## 2021-09-12 ENCOUNTER — Ambulatory Visit (INDEPENDENT_AMBULATORY_CARE_PROVIDER_SITE_OTHER): Payer: BC Managed Care – PPO | Admitting: Family

## 2021-09-12 ENCOUNTER — Encounter: Payer: Self-pay | Admitting: Family

## 2021-09-12 ENCOUNTER — Other Ambulatory Visit: Payer: Self-pay

## 2021-09-12 VITALS — BP 136/83 | HR 74 | Temp 98.3°F | Resp 18 | Ht 72.8 in | Wt 220.0 lb

## 2021-09-12 DIAGNOSIS — F332 Major depressive disorder, recurrent severe without psychotic features: Secondary | ICD-10-CM

## 2021-09-12 DIAGNOSIS — F411 Generalized anxiety disorder: Secondary | ICD-10-CM

## 2021-09-12 DIAGNOSIS — I440 Atrioventricular block, first degree: Secondary | ICD-10-CM

## 2021-09-12 DIAGNOSIS — G4730 Sleep apnea, unspecified: Secondary | ICD-10-CM

## 2021-09-12 DIAGNOSIS — Z09 Encounter for follow-up examination after completed treatment for conditions other than malignant neoplasm: Secondary | ICD-10-CM

## 2021-09-12 DIAGNOSIS — R413 Other amnesia: Secondary | ICD-10-CM

## 2021-09-12 DIAGNOSIS — Z1211 Encounter for screening for malignant neoplasm of colon: Secondary | ICD-10-CM

## 2021-09-12 DIAGNOSIS — R109 Unspecified abdominal pain: Secondary | ICD-10-CM | POA: Diagnosis not present

## 2021-09-12 DIAGNOSIS — G8929 Other chronic pain: Secondary | ICD-10-CM

## 2021-09-12 DIAGNOSIS — R251 Tremor, unspecified: Secondary | ICD-10-CM | POA: Diagnosis not present

## 2021-09-12 NOTE — Progress Notes (Signed)
Pt presents for tremors states it started about 1 month ago, pt states he hopes he doesn't have lime disease because he had a deer tick bite a few years back  ?

## 2021-09-14 DIAGNOSIS — F332 Major depressive disorder, recurrent severe without psychotic features: Secondary | ICD-10-CM | POA: Diagnosis not present

## 2021-09-17 DIAGNOSIS — F332 Major depressive disorder, recurrent severe without psychotic features: Secondary | ICD-10-CM | POA: Diagnosis not present

## 2021-09-17 DIAGNOSIS — F331 Major depressive disorder, recurrent, moderate: Secondary | ICD-10-CM | POA: Diagnosis not present

## 2021-09-17 DIAGNOSIS — F1020 Alcohol dependence, uncomplicated: Secondary | ICD-10-CM | POA: Diagnosis not present

## 2021-09-18 ENCOUNTER — Encounter: Payer: Self-pay | Admitting: Neurology

## 2021-09-18 ENCOUNTER — Ambulatory Visit (INDEPENDENT_AMBULATORY_CARE_PROVIDER_SITE_OTHER): Payer: BC Managed Care – PPO | Admitting: Neurology

## 2021-09-18 VITALS — BP 125/84 | HR 69 | Ht 73.0 in | Wt 235.0 lb

## 2021-09-18 DIAGNOSIS — R4189 Other symptoms and signs involving cognitive functions and awareness: Secondary | ICD-10-CM

## 2021-09-18 DIAGNOSIS — R799 Abnormal finding of blood chemistry, unspecified: Secondary | ICD-10-CM | POA: Diagnosis not present

## 2021-09-18 DIAGNOSIS — R251 Tremor, unspecified: Secondary | ICD-10-CM | POA: Diagnosis not present

## 2021-09-18 DIAGNOSIS — R410 Disorientation, unspecified: Secondary | ICD-10-CM

## 2021-09-18 DIAGNOSIS — R760 Raised antibody titer: Secondary | ICD-10-CM | POA: Diagnosis not present

## 2021-09-18 DIAGNOSIS — R7989 Other specified abnormal findings of blood chemistry: Secondary | ICD-10-CM | POA: Diagnosis not present

## 2021-09-18 DIAGNOSIS — F331 Major depressive disorder, recurrent, moderate: Secondary | ICD-10-CM | POA: Diagnosis not present

## 2021-09-18 DIAGNOSIS — F1020 Alcohol dependence, uncomplicated: Secondary | ICD-10-CM | POA: Diagnosis not present

## 2021-09-18 DIAGNOSIS — R790 Abnormal level of blood mineral: Secondary | ICD-10-CM | POA: Diagnosis not present

## 2021-09-18 NOTE — Progress Notes (Signed)
? ?Chief Complaint  ?Patient presents with  ? New Patient (Initial Visit)  ?  RM 12, with friend. C/o bilateral hand tremors worsening over the past 2 months. C/o poor memory recalls. ? ?PCP/Referring: Durene Fruits, NP  ? ? ? ? ?ASSESSMENT AND PLAN ? ?CHESNEY KLIMASZEWSKI is a 61 y.o. male   ?Constellation of complaints, including increased bilateral hand action tremor, memory loss ? MoCA examination 24/30 ? In the setting of severe depression, stress, ? MRI of the brain was normal ? Laboratory evaluation to rule out treatable etiology ? Continue to work with the psychiatrist for better control of depression, ? Refer to neuropsychiatric evaluation to better understanding his complaints of memory loss, likely his depression playing a major role ?  ?  ? ? ?DIAGNOSTIC DATA (LABS, IMAGING, TESTING) ?- I reviewed patient records, labs, notes, testing and imaging myself where available. ? ? ?MEDICAL HISTORY: ? ?ADRICK KESTLER is a 61 year old male, seen in request by his primary care physician nurse practitioner Minette Brine, Amy for evaluation of bilateral hands tremor, initial evaluation was on September 18, 2021 ? ?I reviewed and summarized the referring note. PMHX. ?Depression ?Chronic insominia ?trazodone for sleep as needed,  ? ?Patient works as a Radiation protection practitioner support, but recently change to a different job, which has caused a lot of stress on him, during the training in February 2023, he describes difficulty keeping up with the requirement, ? ?This is concurrent with his worsening depression ? ?He suffered depression for few years, especially since he separated from his wife in 2018, complains of worsening depression at the beginning of 2023 ? ?Then he began to notice mild bilateral hands tremor, intermittent, also complains of memory loss, difficulty keeping up with his stroke, ? ?UDS March 2023 was positive for marijuana, oxazepam ? ?PHYSICAL EXAM: ?  ?Vitals:  ? 09/18/21 1440  ?BP: 125/84   ?Pulse: 69  ?Weight: 235 lb (106.6 kg)  ?Height: '6\' 1"'$  (1.854 m)  ? ?Not recorded ?  ? ? ?Body mass index is 31 kg/m?. ? ?PHYSICAL EXAMNIATION: ? ?Gen: NAD, conversant, well nourised, well groomed                     ?Cardiovascular: Regular rate rhythm, no peripheral edema, warm, nontender. ?Eyes: Conjunctivae clear without exudates or hemorrhage ?Neck: Supple, no carotid bruits. ?Pulmonary: Clear to auscultation bilaterally  ? ?NEUROLOGICAL EXAM: Anxious looking middle-age male, frequent left shoulder shrugging, ? ?MENTAL STATUS: Very anxious looking middle-age male ?Speech: ?   Speech is normal; fluent and spontaneous with normal comprehension.  ?Cognition: ?     ? ?  09/18/2021  ?  2:40 PM  ?Montreal Cognitive Assessment   ?Visuospatial/ Executive (0/5) 5  ?Naming (0/3) 3  ?Attention: Read list of digits (0/2) 2  ?Attention: Read list of letters (0/1) 1  ?Attention: Serial 7 subtraction starting at 100 (0/3) 3  ?Language: Repeat phrase (0/2) 1  ?Language : Fluency (0/1) 0  ?Abstraction (0/2) 2  ?Delayed Recall (0/5) 2  ?Orientation (0/6) 5  ?Total 24  ?  ?CRANIAL NERVES: ?CN II: Visual fields are full to confrontation. Pupils are round equal and briskly reactive to light. ?CN III, IV, VI: extraocular movement are normal. No ptosis. ?CN V: Facial sensation is intact to light touch ?CN VII: Face is symmetric with normal eye closure  ?CN VIII: Hearing is normal to causal conversation. ?CN IX, X: Phonation is normal. ?CN XI: Head turning and shoulder shrug  are intact ? ?MOTOR: Mild bilateral hands action tremor, no rigidity, no bradykinesia, mild difficulty drawing spiral circle, no weakness. ? ?REFLEXES: ?Reflexes are 2+ and symmetric at the biceps, triceps, knees, and ankles. Plantar responses are flexor. ? ?SENSORY: ?Intact to light touch, pinprick and vibratory sensation are intact in fingers and toes. ? ?COORDINATION: ?There is no trunk or limb dysmetria noted. ? ?GAIT/STANCE: ?Posture is normal. Gait is steady  with normal steps, base, arm swing, and turning. Heel and toe walking are normal. Tandem gait is normal.  ?Romberg is absent. ? ?REVIEW OF SYSTEMS:  ?Full 14 system review of systems performed and notable only for as above ?All other review of systems were negative. ? ? ?ALLERGIES: ?Allergies  ?Allergen Reactions  ? Propyphenazone Anaphylaxis  ?  Felt hot  ? ? ?HOME MEDICATIONS: ?Current Outpatient Medications  ?Medication Sig Dispense Refill  ? desvenlafaxine (PRISTIQ) 50 MG 24 hr tablet Take 1 tablet (50 mg total) by mouth daily. 30 tablet 0  ? doxepin (SINEQUAN) 10 MG capsule Take 10 mg by mouth at bedtime.    ? traZODone (DESYREL) 100 MG tablet Take 100 mg by mouth at bedtime.    ? Vitamin D, Ergocalciferol, (DRISDOL) 1.25 MG (50000 UNIT) CAPS capsule Take 1 capsule (50,000 Units total) by mouth every 7 (seven) days. 5 capsule 0  ? ?No current facility-administered medications for this visit.  ? ? ?PAST MEDICAL HISTORY: ?Past Medical History:  ?Diagnosis Date  ? [name], recurrent severe, without psychosis ([place]) 08/23/2021  ? ? ?PAST SURGICAL HISTORY: ?Past Surgical History:  ?Procedure Laterality Date  ? RHINOPLASTY    ? SEPTOPLASTY    ? SHOULDER ARTHROSCOPY Left   ? SHOULDER ARTHROSCOPY WITH OPEN ROTATOR CUFF REPAIR AND DISTAL CLAVICLE ACROMINECTOMY Right 02/01/2021  ? Procedure: RIGHT SHOULDER ARTHROSCOPY WITH MINI OPEN ROTATOR CUFF REPAIR AND DISTAL CLAVICLE EXCISION;  Surgeon: Thornton Park, MD;  Location: [facility] ORS;  Service: Orthopedics;  Laterality: Right;  ? SPINE SURGERY    ? lumbar laminectomy  ? WRIST SURGERY Bilateral   ? corpal tunnel  ? ? ?FAMILY HISTORY: ?No family history on file. ? ?SOCIAL HISTORY: ?Social History  ? ?Socioeconomic History  ? Marital status: Single  ?  Spouse name: Not on file  ? Number of children: Not on file  ? Years of education: Not on file  ? Highest education level: Not on file  ?Occupational History  ? Not on file  ?Tobacco Use  ? Smoking status:  Every Day  ?  Packs/day: 1.00  ?  Years: 30.00  ?  Pack years: 30.00  ?  Types: Cigarettes  ?  Last attempt to quit: 11/21/2020  ?  Years since quitting: 0.8  ? Smokeless tobacco: Former  ?  Types: Chew  ?  Quit date: 81  ?Vaping Use  ? Vaping Use: Never used  ?Substance and Sexual Activity  ? Alcohol use: Yes  ?  Alcohol/week: 14.0 standard drinks  ?  Types: 14 Standard drinks or equivalent per week  ? Drug use: No  ? Sexual activity: Not Currently  ?Other Topics Concern  ? Not on file  ?Social History Narrative  ? Not on file  ? ?Social Determinants of Health  ? ?Financial Resource Strain: Not on file  ?Food Insecurity: Not on file  ?Transportation Needs: Not on file  ?Physical Activity: Not on file  ?Stress: Not on file  ?Social Connections: Not on file  ?Intimate Partner Violence: Not on file  ? ? ? ? ?  Marcial Pacas, M.D. Ph.D. ? ?[facility] ?[address] ?[place], [place] [address] ?Ph: [phone]) [phone] ?Fax: [phone] ? ?CC:  Camillia Herter, NP ?[facility] ?[address] ?[place],  [place] [address]  Camillia Herter, NP   ?

## 2021-09-19 ENCOUNTER — Ambulatory Visit (INDEPENDENT_AMBULATORY_CARE_PROVIDER_SITE_OTHER): Payer: BC Managed Care – PPO | Admitting: Cardiovascular Disease

## 2021-09-19 ENCOUNTER — Encounter: Payer: Self-pay | Admitting: Cardiovascular Disease

## 2021-09-19 VITALS — BP 124/78 | HR 87 | Ht 73.0 in | Wt 237.0 lb

## 2021-09-19 DIAGNOSIS — I4510 Unspecified right bundle-branch block: Secondary | ICD-10-CM | POA: Diagnosis not present

## 2021-09-19 DIAGNOSIS — F17200 Nicotine dependence, unspecified, uncomplicated: Secondary | ICD-10-CM

## 2021-09-19 DIAGNOSIS — F331 Major depressive disorder, recurrent, moderate: Secondary | ICD-10-CM | POA: Diagnosis not present

## 2021-09-19 DIAGNOSIS — I440 Atrioventricular block, first degree: Secondary | ICD-10-CM | POA: Diagnosis not present

## 2021-09-19 DIAGNOSIS — F1020 Alcohol dependence, uncomplicated: Secondary | ICD-10-CM | POA: Diagnosis not present

## 2021-09-19 DIAGNOSIS — R251 Tremor, unspecified: Secondary | ICD-10-CM

## 2021-09-19 DIAGNOSIS — F332 Major depressive disorder, recurrent severe without psychotic features: Secondary | ICD-10-CM

## 2021-09-19 NOTE — Progress Notes (Signed)
? ?Cardiology Office Note   ? ?Date:  09/27/2021  ? ?ID:  Billy Roman, DOB 02-19-1961, MRN [id number] ? ?PCP:  Camillia Herter, NP  ?Cardiologist:  Shelva Majestic, MD  ? ?New cardiology evaluation referred for evaluation of first-degree heart block. ? ? ?History of Present Illness:  ?Billy Roman is a 61 y.o. male who has a history of depression and had recently been hospitalized from March 9 through August 31, 2021 with complaints of poor focus, brain fog, anxiety, and at that time was under significant job stress, and admitted to significant alcohol use.  He is recently divorced and had spent much time isolating at home.  He had been hospitalized on the psychiatry service with major depressive disorder without psychosis.  He has had issues with tremor of both hands and has undergone neurologic evaluation by Dr. Krista Blue.  An MRI of his brain was normal.  When he was in the hospital, ECG showed sinus rhythm with incomplete right bundle branch block and first-degree AV block with a PR interval at 234 ms.  He now presents for cardiology evaluation. ? ?Presently, he denies any chest pain.  He denies any exertional shortness of breath.  He has had carpal tunnel surgery and recent rotator cuff surgeries.  He has had issues with tremor more predominantly of his left arm and hand.  He admits to fatigability.  He denies any presyncope.  He denies any awareness of tachypalpitations.  He has been smoking off and on since 1980 and had quit on 2 occasions for 3 years.  He drinks on a daily basis.  He presents for evaluation. ? ? ? ?Past Medical History:  ?Diagnosis Date  ? [name], recurrent severe, without psychosis ([place]) 08/23/2021  ? ? ?Past Surgical History:  ?Procedure Laterality Date  ? RHINOPLASTY    ? SEPTOPLASTY    ? SHOULDER ARTHROSCOPY Left   ? SHOULDER ARTHROSCOPY WITH OPEN ROTATOR CUFF REPAIR AND DISTAL CLAVICLE ACROMINECTOMY Right 02/01/2021  ? Procedure: RIGHT SHOULDER ARTHROSCOPY WITH  MINI OPEN ROTATOR CUFF REPAIR AND DISTAL CLAVICLE EXCISION;  Surgeon: Thornton Park, MD;  Location: [facility] ORS;  Service: Orthopedics;  Laterality: Right;  ? SPINE SURGERY    ? lumbar laminectomy  ? WRIST SURGERY Bilateral   ? corpal tunnel  ? ? ?Current Medications: ?Outpatient Medications Prior to Visit  ?Medication Sig Dispense Refill  ? desvenlafaxine (PRISTIQ) 50 MG 24 hr tablet Take 1 tablet (50 mg total) by mouth daily. 30 tablet 0  ? doxepin (SINEQUAN) 10 MG capsule Take 10 mg by mouth at bedtime.    ? traZODone (DESYREL) 100 MG tablet Take 100 mg by mouth at bedtime.    ? Vitamin D, Ergocalciferol, (DRISDOL) 1.25 MG (50000 UNIT) CAPS capsule Take 1 capsule (50,000 Units total) by mouth every 7 (seven) days. 5 capsule 0  ? ?No facility-administered medications prior to visit.  ?  ? ?Allergies:   Propyphenazone  ? ?Social History  ? ?Socioeconomic History  ? Marital status: Single  ?  Spouse name: Not on file  ? Number of children: Not on file  ? Years of education: Not on file  ? Highest education level: Not on file  ?Occupational History  ? Not on file  ?Tobacco Use  ? Smoking status: Every Day  ?  Packs/day: 1.00  ?  Years: 30.00  ?  Pack years: 30.00  ?  Types: Cigarettes  ?  Last attempt to quit: 11/21/2020  ?  Years since quitting: 0.8  ? Smokeless tobacco: Former  ?  Types: Chew  ?  Quit date: 23  ?Vaping Use  ? Vaping Use: Never used  ?Substance and Sexual Activity  ? Alcohol use: Yes  ?  Alcohol/week: 14.0 standard drinks  ?  Types: 14 Standard drinks or equivalent per week  ? Drug use: No  ? Sexual activity: Not Currently  ?Other Topics Concern  ? Not on file  ?Social History Narrative  ? Not on file  ? ?Social Determinants of Health  ? ?Financial Resource Strain: Not on file  ?Food Insecurity: Not on file  ?Transportation Needs: Not on file  ?Physical Activity: Not on file  ?Stress: Not on file  ?Social Connections: Not on file  ?  ?Socially, he was born in [place].  He is divorced for 4  years.  He has 2 children and 3 grandchildren with a fourth on the way.  He works for [employer].  He completed 12 grade education.  He has been smoking off and on since 1980 he does walk 2 days/week for approximately 30 minutes. ? ?Family History: He was adopted at 10 months.  The parents who raised him are both deceased in their 32s.  He has 1 sister age 28.  He has 2 daughters ages 66 with asthma and hypertension and 5 with lactose intolerance status post weight loss surgery. ? ? ?ROS ?General: Negative; No fevers, chills, or night sweats; fatigue ?HEENT: Negative; No changes in vision or hearing, sinus congestion, difficulty swallowing ?Pulmonary: Negative; No cough, wheezing, shortness of breath, hemoptysis ?Cardiovascular: Negative; No chest pain, presyncope, syncope, palpitations ?GI: Negative; No nausea, vomiting, diarrhea, or abdominal pain ?GU: Negative; No dysuria, hematuria, or difficulty voiding ?Musculoskeletal: Rotator cuff surgery, carpal tunnel surgery ?Hematologic/Oncology: Negative; no easy bruising, bleeding ?Endocrine: Negative; no heat/cold intolerance; no diabetes ?Neuro: Positive for tremor more involving his left arm. ?Skin: Negative; No rashes or skin lesions ?Psychiatric: Major depressive disorder ?Sleep: Negative; No snoring, daytime sleepiness, hypersomnolence, bruxism, restless legs, hypnogognic hallucinations, no cataplexy ?Other comprehensive 14 point system review is negative. ? ? ?PHYSICAL EXAM:   ?VS:  BP 124/78   Pulse 87   Ht '6\' 1"'  (1.854 m)   Wt 237 lb (107.5 kg)   SpO2 95%   BMI 31.27 kg/m?    ? ?Repeat blood pressure by me 114/76 ? ?Wt Readings from Last 3 Encounters:  ?09/19/21 237 lb (107.5 kg)  ?09/18/21 235 lb (106.6 kg)  ?09/12/21 220 lb (99.8 kg)  ?  ?General: Alert, oriented, no distress.  ?Skin: normal turgor, no rashes, warm and dry ?HEENT: Normocephalic, atraumatic. Pupils equal round and reactive to light; sclera anicteric;  extraocular muscles intact; ?Nose without nasal septal hypertrophy ?Mouth/Parynx benign; Mallinpatti scale 3 ?Neck: No JVD, no carotid bruits; normal carotid upstroke ?Lungs: clear to ausculatation and percussion; no wheezing or rales ?Chest wall: without tenderness to palpitation ?Heart: PMI not displaced, RRR, s1 s2 normal, 1/6 systolic murmur, no diastolic murmur, no rubs, gallops, thrills, or heaves ?Abdomen: soft, nontender; no hepatosplenomehaly, BS+; abdominal aorta nontender and not dilated by palpation. ?Back: no CVA tenderness ?Pulses 2+ ?Musculoskeletal: full range of motion, normal strength, no joint deformities ?Extremities: no clubbing cyanosis or edema, Homan's sign negative  ?Neurologic: He appears to have slight right facial droop ?Psychologic: Normal mood and affect ? ? ?Studies/Labs Reviewed:  ? ?September 19, 2021.  ECG (independently read by me): Normal sinus rhythm at 87 bpm.  Mild  first-degree AV block with a PR 208 msec.  Right axis deviation.  Incomplete right bundle branch block. ? ?I personally reviewed his ECG from August 28, 2021 which showed sinus rhythm at 73 with first-degree AV block with a period of 1 to 34 ms.  There is an incomplete right bundle branch block. ? ?Recent Labs: ? ?  Latest Ref Rng & Units 08/22/2021  ?  9:42 PM 01/15/2021  ? 10:32 AM 07/26/2019  ? 12:18 PM  ?BMP  ?Glucose 70 - 99 mg/dL 84   96   92    ?BUN 6 - 20 mg/dL '6   11   17    ' ?Creatinine 0.61 - 1.24 mg/dL 0.64   0.72   0.76    ?BUN/Creat Ratio 9 - '20  15   22    ' ?Sodium 135 - 145 mmol/L 136   139   140    ?Potassium 3.5 - 5.1 mmol/L 3.6   4.4   4.3    ?Chloride 98 - 111 mmol/L 103   103   101    ?CO2 22 - 32 mmol/L '21   20   21    ' ?Calcium 8.9 - 10.3 mg/dL 9.2   10.0   9.7    ? ? ? ? ?  Latest Ref Rng & Units 09/18/2021  ?  4:03 PM 08/22/2021  ?  9:42 PM 01/15/2021  ? 10:32 AM  ?Hepatic Function  ?Total Protein 6.0 - 8.5 g/dL 7.5   7.5   7.1    ?Albumin 3.5 - 5.0 g/dL  4.0   4.6    ?AST 15 - 41 U/L  23   21    ?ALT 0 - 44 U/L   14   21    ?Alk Phosphatase 38 - 126 U/L  74   91    ?Total Bilirubin 0.3 - 1.2 mg/dL  0.5   0.6    ? ? ? ?  Latest Ref Rng & Units 08/22/2021  ?  9:42 PM 01/15/2021  ? 10:32 AM 07/26/2019  ? 12:18 PM  ?CBC  ?WBC 4.0 -

## 2021-09-19 NOTE — Patient Instructions (Signed)
Medication Instructions:  ?Your physician recommends that you continue on your current medications as directed. Please refer to the Current Medication list given to you today. ? ?*If you need a refill on your cardiac medications before your next appointment, please call your pharmacy* ? ? ?Testing/Procedures: ?Your physician has requested that you have an echocardiogram. Echocardiography is a painless test that uses sound waves to create images of your heart. It provides your doctor with information about the size and shape of your heart and how well your heart?s chambers and valves are working. This procedure takes approximately one hour. There are no restrictions for this procedure. This procedure will be done at [address] ? ? ?Follow-Up: ?At [facility], you and your health needs are our priority.  As part of our continuing mission to provide you with exceptional heart care, we have created designated Provider Care Teams.  These Care Teams include your primary Cardiologist (physician) and Advanced Practice Providers (APPs -  Physician Assistants and Nurse Practitioners) who all work together to provide you with the care you need, when you need it. ? ?We recommend signing up for the patient portal called "MyChart".  Sign up information is provided on this After Visit Summary.  MyChart is used to connect with patients for Virtual Visits (Telemedicine).  Patients are able to view lab/test results, encounter notes, upcoming appointments, etc.  Non-urgent messages can be sent to your provider as well.   ?To learn more about what you can do with MyChart, go to [email or web address].   ? ?Your next appointment:   ?We will see you on an as needed basis. ? ?Provider:   ?Shelva Majestic, MD ?

## 2021-09-20 ENCOUNTER — Telehealth: Payer: Self-pay | Admitting: Neurology

## 2021-09-20 DIAGNOSIS — F1994 Other psychoactive substance use, unspecified with psychoactive substance-induced mood disorder: Secondary | ICD-10-CM | POA: Diagnosis not present

## 2021-09-20 DIAGNOSIS — F331 Major depressive disorder, recurrent, moderate: Secondary | ICD-10-CM | POA: Diagnosis not present

## 2021-09-20 DIAGNOSIS — F1020 Alcohol dependence, uncomplicated: Secondary | ICD-10-CM | POA: Diagnosis not present

## 2021-09-20 NOTE — Telephone Encounter (Signed)
Referral sent to [facility] [phone]. ?

## 2021-09-21 DIAGNOSIS — F331 Major depressive disorder, recurrent, moderate: Secondary | ICD-10-CM | POA: Diagnosis not present

## 2021-09-21 DIAGNOSIS — F1020 Alcohol dependence, uncomplicated: Secondary | ICD-10-CM | POA: Diagnosis not present

## 2021-09-25 DIAGNOSIS — F1020 Alcohol dependence, uncomplicated: Secondary | ICD-10-CM | POA: Diagnosis not present

## 2021-09-25 DIAGNOSIS — F331 Major depressive disorder, recurrent, moderate: Secondary | ICD-10-CM | POA: Diagnosis not present

## 2021-09-27 ENCOUNTER — Encounter: Payer: Self-pay | Admitting: Cardiovascular Disease

## 2021-09-27 DIAGNOSIS — F331 Major depressive disorder, recurrent, moderate: Secondary | ICD-10-CM | POA: Diagnosis not present

## 2021-09-27 DIAGNOSIS — F1020 Alcohol dependence, uncomplicated: Secondary | ICD-10-CM | POA: Diagnosis not present

## 2021-10-01 ENCOUNTER — Ambulatory Visit (HOSPITAL_COMMUNITY): Payer: BC Managed Care – PPO | Attending: Cardiology

## 2021-10-01 DIAGNOSIS — F331 Major depressive disorder, recurrent, moderate: Secondary | ICD-10-CM | POA: Diagnosis not present

## 2021-10-01 DIAGNOSIS — F1020 Alcohol dependence, uncomplicated: Secondary | ICD-10-CM | POA: Diagnosis not present

## 2021-10-01 DIAGNOSIS — I4510 Unspecified right bundle-branch block: Secondary | ICD-10-CM | POA: Insufficient documentation

## 2021-10-01 LAB — ECHOCARDIOGRAM COMPLETE
Area-P 1/2: 3.03 cm2
S' Lateral: 3.4 cm

## 2021-10-02 DIAGNOSIS — F331 Major depressive disorder, recurrent, moderate: Secondary | ICD-10-CM | POA: Diagnosis not present

## 2021-10-02 DIAGNOSIS — F1020 Alcohol dependence, uncomplicated: Secondary | ICD-10-CM | POA: Diagnosis not present

## 2021-10-03 DIAGNOSIS — F331 Major depressive disorder, recurrent, moderate: Secondary | ICD-10-CM | POA: Diagnosis not present

## 2021-10-03 DIAGNOSIS — F1020 Alcohol dependence, uncomplicated: Secondary | ICD-10-CM | POA: Diagnosis not present

## 2021-10-04 DIAGNOSIS — F1020 Alcohol dependence, uncomplicated: Secondary | ICD-10-CM | POA: Diagnosis not present

## 2021-10-04 DIAGNOSIS — F331 Major depressive disorder, recurrent, moderate: Secondary | ICD-10-CM | POA: Diagnosis not present

## 2021-10-04 DIAGNOSIS — F1994 Other psychoactive substance use, unspecified with psychoactive substance-induced mood disorder: Secondary | ICD-10-CM | POA: Diagnosis not present

## 2021-10-04 LAB — MULTIPLE MYELOMA PANEL, SERUM
Albumin SerPl Elph-Mcnc: 3.6 g/dL (ref 2.9–4.4)
Albumin/Glob SerPl: 1 (ref 0.7–1.7)
Alpha 1: 0.3 g/dL (ref 0.0–0.4)
Alpha2 Glob SerPl Elph-Mcnc: 1.2 g/dL — ABNORMAL HIGH (ref 0.4–1.0)
B-Globulin SerPl Elph-Mcnc: 1.2 g/dL (ref 0.7–1.3)
Gamma Glob SerPl Elph-Mcnc: 1.2 g/dL (ref 0.4–1.8)
Globulin, Total: 3.9 g/dL (ref 2.2–3.9)
IgA/Immunoglobulin A, Serum: 294 mg/dL (ref 90–386)
IgG (Immunoglobin G), Serum: 1013 mg/dL (ref 603–1613)
IgM (Immunoglobulin M), Srm: 202 mg/dL — ABNORMAL HIGH (ref 20–172)
Total Protein: 7.5 g/dL (ref 6.0–8.5)

## 2021-10-04 LAB — THYROID PEROXIDASE ANTIBODY: Thyroperoxidase Ab SerPl-aCnc: 14 IU/mL (ref 0–34)

## 2021-10-04 LAB — RPR: RPR Ser Ql: NONREACTIVE

## 2021-10-04 LAB — ETHANOL

## 2021-10-04 LAB — THC,MS,WB/SP RFX
Cannabidiol: NEGATIVE ng/mL
Cannabinoid Confirmation: POSITIVE
Carboxy-THC: 5 ng/mL
Hydroxy-THC: NEGATIVE ng/mL
Tetrahydrocannabinol(THC): NEGATIVE ng/mL

## 2021-10-04 LAB — DRUG SCREEN 10 W/CONF, SERUM
Amphetamines, IA: NEGATIVE ng/mL
Barbiturates, IA: NEGATIVE ug/mL
Benzodiazepines, IA: NEGATIVE ng/mL
Cocaine & Metabolite, IA: NEGATIVE ng/mL
Methadone, IA: NEGATIVE ng/mL
Opiates, IA: NEGATIVE ng/mL
Oxycodones, IA: NEGATIVE ng/mL
Phencyclidine, IA: NEGATIVE ng/mL
Propoxyphene, IA: NEGATIVE ng/mL
THC(Marijuana) Metabolite, IA: POSITIVE ng/mL — AB

## 2021-10-04 LAB — ANA W/REFLEX IF POSITIVE: Anti Nuclear Antibody (ANA): NEGATIVE

## 2021-10-04 LAB — C-REACTIVE PROTEIN: CRP: 8 mg/L (ref 0–10)

## 2021-10-04 LAB — THYROGLOBULIN ANTIBODY: Thyroglobulin Antibody: <1 IU/mL (ref 0.0–0.9)

## 2021-10-04 LAB — HIV ANTIBODY (ROUTINE TESTING W REFLEX): HIV Screen 4th Generation wRfx: NONREACTIVE

## 2021-10-04 LAB — CK: Total CK: 131 U/L (ref 41–331)

## 2021-10-04 LAB — SEDIMENTATION RATE: Sed Rate: 31 mm/hr — ABNORMAL HIGH (ref 0–30)

## 2021-10-04 NOTE — Progress Notes (Signed)
? ? ?Patient ID: Billy Roman, male    DOB: Feb 17, 1961  MRN: [id number] ? ?CC: Back Pain ? ?Subjective: ?Billy Roman is a 61 y.o. male who presents for back pain.  ? ?His concerns today include:  ?Reports chronic low back pain. Denies recent injury/trauma and additional red flag symptoms. Worsening since this past weekend. Reports pain usually lasts a few days and then resolves. Had spinal surgery in the past and followed by Neurosurgery at that time. Reports was told no follow-up needed from the same. Has taken Prednisone in the past to help. ? ?Patient Active Problem List  ? Diagnosis Date Noted  ? Cognitive impairment 09/18/2021  ? Confusion 09/18/2021  ? Tremor 09/18/2021  ? Complaints of memory disturbance 08/24/2021  ? Tremor of both hands 08/24/2021  ? H/O rotator cuff surgery 08/24/2021  ? History of COVID-19 08/24/2021  ? Smoker 08/24/2021  ? [name], recurrent severe, without psychosis ([place]) 08/23/2021  ? Stiffness of right shoulder joint 02/09/2021  ? Pain in joint of right shoulder 02/09/2021  ? Vitamin D deficiency 01/17/2021  ? HNP (herniated nucleus pulposus), lumbar 01/15/2021  ? Sciatica 01/15/2021  ? Anxiety and depression 08/23/2009  ?  ? ?Current Outpatient Medications on File Prior to Visit  ?Medication Sig Dispense Refill  ? atomoxetine (STRATTERA) 25 MG capsule Take 25 mg by mouth every morning.    ? desvenlafaxine (PRISTIQ) 50 MG 24 hr tablet Take 1 tablet (50 mg total) by mouth daily. 30 tablet 0  ? doxepin (SINEQUAN) 10 MG capsule Take 10 mg by mouth at bedtime.    ? gabapentin (NEURONTIN) 100 MG capsule Take 100 mg by mouth 2 (two) times daily.    ? traZODone (DESYREL) 100 MG tablet Take 100 mg by mouth at bedtime.    ? Vitamin D, Ergocalciferol, (DRISDOL) 1.25 MG (50000 UNIT) CAPS capsule Take 1 capsule (50,000 Units total) by mouth every 7 (seven) days. 5 capsule 0  ? ?No current facility-administered medications on file prior to visit.  ? ? ?Allergies   ?Allergen Reactions  ? Propyphenazone Anaphylaxis  ?  Felt hot  ? ? ?Social History  ? ?Socioeconomic History  ? Marital status: Single  ?  Spouse name: Not on file  ? Number of children: Not on file  ? Years of education: Not on file  ? Highest education level: Not on file  ?Occupational History  ? Not on file  ?Tobacco Use  ? Smoking status: Every Day  ?  Packs/day: 1.00  ?  Years: 30.00  ?  Pack years: 30.00  ?  Types: Cigarettes  ?  Last attempt to quit: 11/21/2020  ?  Years since quitting: 0.8  ?  Passive exposure: Never  ? Smokeless tobacco: Former  ?  Types: Chew  ?  Quit date: 7  ?Vaping Use  ? Vaping Use: Never used  ?Substance and Sexual Activity  ? Alcohol use: Yes  ?  Alcohol/week: 14.0 standard drinks  ?  Types: 14 Standard drinks or equivalent per week  ? Drug use: No  ? Sexual activity: Not Currently  ?Other Topics Concern  ? Not on file  ?Social History Narrative  ? Not on file  ? ?Social Determinants of Health  ? ?Financial Resource Strain: Not on file  ?Food Insecurity: Not on file  ?Transportation Needs: Not on file  ?Physical Activity: Not on file  ?Stress: Not on file  ?Social Connections: Not on file  ?Intimate Partner Violence: Not  on file  ? ? ?History reviewed. No pertinent family history. ? ?Past Surgical History:  ?Procedure Laterality Date  ? RHINOPLASTY    ? SEPTOPLASTY    ? SHOULDER ARTHROSCOPY Left   ? SHOULDER ARTHROSCOPY WITH OPEN ROTATOR CUFF REPAIR AND DISTAL CLAVICLE ACROMINECTOMY Right 02/01/2021  ? Procedure: RIGHT SHOULDER ARTHROSCOPY WITH MINI OPEN ROTATOR CUFF REPAIR AND DISTAL CLAVICLE EXCISION;  Surgeon: Thornton Park, MD;  Location: [facility] ORS;  Service: Orthopedics;  Laterality: Right;  ? SPINE SURGERY    ? lumbar laminectomy  ? WRIST SURGERY Bilateral   ? corpal tunnel  ? ? ?ROS: ?Review of Systems ?Negative except as stated above ? ?PHYSICAL EXAM: ?BP 128/84 (BP Location: Left Arm, Patient Position: Sitting, Cuff Size: Large)   Pulse 82   Temp 98.3 ?F (36.8 ?C)    Resp 18   Ht 6' 0.99" (1.854 m)   Wt 240 lb (108.9 kg)   SpO2 95%   BMI 31.67 kg/m?  ? ?Physical Exam ?HENT:  ?   Head: Normocephalic and atraumatic.  ?Eyes:  ?   Extraocular Movements: Extraocular movements intact.  ?   Conjunctiva/sclera: Conjunctivae normal.  ?   Pupils: Pupils are equal, round, and reactive to light.  ?Cardiovascular:  ?   Rate and Rhythm: Normal rate and regular rhythm.  ?   Pulses: Normal pulses.  ?   Heart sounds: Normal heart sounds.  ?Pulmonary:  ?   Effort: Pulmonary effort is normal.  ?   Breath sounds: Normal breath sounds.  ?Musculoskeletal:  ?   Cervical back: Normal range of motion and neck supple.  ?Neurological:  ?   General: No focal deficit present.  ?   Mental Status: He is alert and oriented to person, place, and time.  ?Psychiatric:     ?   Mood and Affect: Mood normal.     ?   Behavior: Behavior normal.  ? ?ASSESSMENT AND PLAN: ?1. Chronic low back pain, unspecified back pain laterality, unspecified whether sciatica present: ?- Prednisone as prescribed. Counseled on medication adherence and adverse effects. ?- Referral to Orthopedic Surgery for further evaluation and management.  ?- Ambulatory referral to Orthopedic Surgery ?- predniSONE (DELTASONE) 10 MG tablet; Take 6 tablets (60 mg total) by mouth daily with breakfast for 1 day, THEN 5 tablets (50 mg total) daily with breakfast for 1 day, THEN 4 tablets (40 mg total) daily with breakfast for 1 day, THEN 3 tablets (30 mg total) daily with breakfast for 1 day, THEN 2 tablets (20 mg total) daily with breakfast for 1 day, THEN 1 tablet (10 mg total) daily with breakfast for 1 day.  Dispense: 21 tablet; Refill: 0 ? ? ?Patient was given the opportunity to ask questions.  Patient verbalized understanding of the plan and was able to repeat key elements of the plan. Patient was given clear instructions to go to Emergency Department or return to medical center if symptoms don't improve, worsen, or new problems develop.The patient  verbalized understanding. ? ? ?Orders Placed This Encounter  ?Procedures  ? Ambulatory referral to Orthopedic Surgery  ? ? ?Requested Prescriptions  ? ?Signed Prescriptions Disp Refills  ? predniSONE (DELTASONE) 10 MG tablet 21 tablet 0  ?  Sig: Take 6 tablets (60 mg total) by mouth daily with breakfast for 1 day, THEN 5 tablets (50 mg total) daily with breakfast for 1 day, THEN 4 tablets (40 mg total) daily with breakfast for 1 day, THEN 3 tablets (30 mg total) daily with  breakfast for 1 day, THEN 2 tablets (20 mg total) daily with breakfast for 1 day, THEN 1 tablet (10 mg total) daily with breakfast for 1 day.  ? ? ?Follow-up with primary provider as scheduled.  ? ?Camillia Herter, NP  ?

## 2021-10-05 DIAGNOSIS — F1020 Alcohol dependence, uncomplicated: Secondary | ICD-10-CM | POA: Diagnosis not present

## 2021-10-05 DIAGNOSIS — F331 Major depressive disorder, recurrent, moderate: Secondary | ICD-10-CM | POA: Diagnosis not present

## 2021-10-08 DIAGNOSIS — F1020 Alcohol dependence, uncomplicated: Secondary | ICD-10-CM | POA: Diagnosis not present

## 2021-10-08 DIAGNOSIS — F331 Major depressive disorder, recurrent, moderate: Secondary | ICD-10-CM | POA: Diagnosis not present

## 2021-10-09 ENCOUNTER — Encounter: Payer: Self-pay | Admitting: Family

## 2021-10-09 ENCOUNTER — Ambulatory Visit (INDEPENDENT_AMBULATORY_CARE_PROVIDER_SITE_OTHER): Payer: BC Managed Care – PPO | Admitting: Family

## 2021-10-09 VITALS — BP 128/84 | HR 82 | Temp 98.3°F | Resp 18 | Ht 72.99 in | Wt 240.0 lb

## 2021-10-09 DIAGNOSIS — M545 Low back pain, unspecified: Secondary | ICD-10-CM

## 2021-10-09 DIAGNOSIS — G8929 Other chronic pain: Secondary | ICD-10-CM

## 2021-10-09 MED ORDER — PREDNISONE 10 MG PO TABS: ORAL_TABLET | ORAL | 0 refills | Status: AC

## 2021-10-09 NOTE — Progress Notes (Signed)
Pt presents for lower back pain and left shoulder pain  ?

## 2021-10-10 DIAGNOSIS — F331 Major depressive disorder, recurrent, moderate: Secondary | ICD-10-CM | POA: Diagnosis not present

## 2021-10-10 DIAGNOSIS — F1090 Alcohol use, unspecified, uncomplicated: Secondary | ICD-10-CM | POA: Diagnosis not present

## 2021-10-12 DIAGNOSIS — F1090 Alcohol use, unspecified, uncomplicated: Secondary | ICD-10-CM | POA: Diagnosis not present

## 2021-10-12 DIAGNOSIS — F331 Major depressive disorder, recurrent, moderate: Secondary | ICD-10-CM | POA: Diagnosis not present

## 2021-10-15 DIAGNOSIS — F1020 Alcohol dependence, uncomplicated: Secondary | ICD-10-CM | POA: Diagnosis not present

## 2021-10-15 DIAGNOSIS — F331 Major depressive disorder, recurrent, moderate: Secondary | ICD-10-CM | POA: Diagnosis not present

## 2021-10-17 DIAGNOSIS — F1020 Alcohol dependence, uncomplicated: Secondary | ICD-10-CM | POA: Diagnosis not present

## 2021-10-17 DIAGNOSIS — F331 Major depressive disorder, recurrent, moderate: Secondary | ICD-10-CM | POA: Diagnosis not present

## 2021-10-19 DIAGNOSIS — F1020 Alcohol dependence, uncomplicated: Secondary | ICD-10-CM | POA: Diagnosis not present

## 2021-10-19 DIAGNOSIS — F331 Major depressive disorder, recurrent, moderate: Secondary | ICD-10-CM | POA: Diagnosis not present

## 2021-10-22 DIAGNOSIS — F331 Major depressive disorder, recurrent, moderate: Secondary | ICD-10-CM | POA: Diagnosis not present

## 2021-10-22 DIAGNOSIS — F1020 Alcohol dependence, uncomplicated: Secondary | ICD-10-CM | POA: Diagnosis not present

## 2021-10-24 DIAGNOSIS — F1020 Alcohol dependence, uncomplicated: Secondary | ICD-10-CM | POA: Diagnosis not present

## 2021-10-24 DIAGNOSIS — F331 Major depressive disorder, recurrent, moderate: Secondary | ICD-10-CM | POA: Diagnosis not present

## 2021-10-25 ENCOUNTER — Ambulatory Visit: Payer: BC Managed Care – PPO | Admitting: Neurology

## 2021-10-25 DIAGNOSIS — F1994 Other psychoactive substance use, unspecified with psychoactive substance-induced mood disorder: Secondary | ICD-10-CM | POA: Diagnosis not present

## 2021-10-25 DIAGNOSIS — F331 Major depressive disorder, recurrent, moderate: Secondary | ICD-10-CM | POA: Diagnosis not present

## 2021-10-26 DIAGNOSIS — F331 Major depressive disorder, recurrent, moderate: Secondary | ICD-10-CM | POA: Diagnosis not present

## 2021-10-26 DIAGNOSIS — F1020 Alcohol dependence, uncomplicated: Secondary | ICD-10-CM | POA: Diagnosis not present

## 2021-10-29 DIAGNOSIS — F331 Major depressive disorder, recurrent, moderate: Secondary | ICD-10-CM | POA: Diagnosis not present

## 2021-10-29 DIAGNOSIS — F1020 Alcohol dependence, uncomplicated: Secondary | ICD-10-CM | POA: Diagnosis not present

## 2021-10-31 DIAGNOSIS — F1020 Alcohol dependence, uncomplicated: Secondary | ICD-10-CM | POA: Diagnosis not present

## 2021-10-31 DIAGNOSIS — F331 Major depressive disorder, recurrent, moderate: Secondary | ICD-10-CM | POA: Diagnosis not present

## 2021-11-02 DIAGNOSIS — F1020 Alcohol dependence, uncomplicated: Secondary | ICD-10-CM | POA: Diagnosis not present

## 2021-11-02 DIAGNOSIS — F331 Major depressive disorder, recurrent, moderate: Secondary | ICD-10-CM | POA: Diagnosis not present

## 2021-11-05 DIAGNOSIS — F1020 Alcohol dependence, uncomplicated: Secondary | ICD-10-CM | POA: Diagnosis not present

## 2021-11-05 DIAGNOSIS — F331 Major depressive disorder, recurrent, moderate: Secondary | ICD-10-CM | POA: Diagnosis not present

## 2021-11-08 DIAGNOSIS — F331 Major depressive disorder, recurrent, moderate: Secondary | ICD-10-CM | POA: Diagnosis not present

## 2021-11-08 DIAGNOSIS — F1090 Alcohol use, unspecified, uncomplicated: Secondary | ICD-10-CM | POA: Diagnosis not present

## 2021-11-09 DIAGNOSIS — F331 Major depressive disorder, recurrent, moderate: Secondary | ICD-10-CM | POA: Diagnosis not present

## 2021-11-09 DIAGNOSIS — F1020 Alcohol dependence, uncomplicated: Secondary | ICD-10-CM | POA: Diagnosis not present

## 2021-11-12 DIAGNOSIS — F1020 Alcohol dependence, uncomplicated: Secondary | ICD-10-CM | POA: Diagnosis not present

## 2021-11-12 DIAGNOSIS — F331 Major depressive disorder, recurrent, moderate: Secondary | ICD-10-CM | POA: Diagnosis not present

## 2021-11-14 DIAGNOSIS — F1020 Alcohol dependence, uncomplicated: Secondary | ICD-10-CM | POA: Diagnosis not present

## 2021-11-14 DIAGNOSIS — F331 Major depressive disorder, recurrent, moderate: Secondary | ICD-10-CM | POA: Diagnosis not present

## 2021-11-15 ENCOUNTER — Ambulatory Visit (INDEPENDENT_AMBULATORY_CARE_PROVIDER_SITE_OTHER): Payer: BC Managed Care – PPO | Admitting: Internal Medicine

## 2021-11-15 ENCOUNTER — Other Ambulatory Visit (INDEPENDENT_AMBULATORY_CARE_PROVIDER_SITE_OTHER): Payer: BC Managed Care – PPO

## 2021-11-15 ENCOUNTER — Encounter: Payer: Self-pay | Admitting: Internal Medicine

## 2021-11-15 VITALS — BP 126/80 | HR 81 | Ht 73.0 in | Wt 246.6 lb

## 2021-11-15 DIAGNOSIS — K921 Melena: Secondary | ICD-10-CM | POA: Diagnosis not present

## 2021-11-15 DIAGNOSIS — R194 Change in bowel habit: Secondary | ICD-10-CM | POA: Diagnosis not present

## 2021-11-15 DIAGNOSIS — R195 Other fecal abnormalities: Secondary | ICD-10-CM

## 2021-11-15 LAB — CBC WITH DIFFERENTIAL/PLATELET
Basophils Absolute: 0.1 10*3/uL (ref 0.0–0.1)
Basophils Relative: 0.8 % (ref 0.0–3.0)
Eosinophils Absolute: 0.2 10*3/uL (ref 0.0–0.7)
Eosinophils Relative: 2.5 % (ref 0.0–5.0)
HCT: 46.9 % (ref 39.0–52.0)
Hemoglobin: 15.6 g/dL (ref 13.0–17.0)
Lymphocytes Relative: 26.7 % (ref 12.0–46.0)
Lymphs Abs: 2 10*3/uL (ref 0.7–4.0)
MCHC: 33.2 g/dL (ref 30.0–36.0)
MCV: 87.5 fl (ref 78.0–100.0)
Monocytes Absolute: 0.6 10*3/uL (ref 0.1–1.0)
Monocytes Relative: 7.9 % (ref 3.0–12.0)
Neutro Abs: 4.7 10*3/uL (ref 1.4–7.7)
Neutrophils Relative %: 62.1 % (ref 43.0–77.0)
Platelets: 307 10*3/uL (ref 150.0–400.0)
RBC: 5.36 Mil/uL (ref 4.22–5.81)
RDW: 15 % (ref 11.5–15.5)
WBC: 7.5 10*3/uL (ref 4.0–10.5)

## 2021-11-15 MED ORDER — NA SULFATE-K SULFATE-MG SULF 17.5-3.13-1.6 GM/177ML PO SOLN: 1.0000 | Freq: Once | ORAL | 0 refills | Status: AC

## 2021-11-15 NOTE — Progress Notes (Signed)
Billy Roman 61 y.o. 1961/02/19 [id number]  Assessment & Plan:   Encounter Diagnoses  Name Primary?   Change in bowel habits Yes   Decreased stool caliber    Black stool - intermittent     Evaluate with colonoscopy and also CBC regarding the black stools.  Was not anemic in March.  The risks and benefits as well as alternatives of endoscopic procedure(s) have been discussed and reviewed. All questions answered. The patient agrees to proceed.   Subjective:   Chief Complaint: Change in bowel habits  HPI 61 year old white man with a history of depression with recent hospitalization in March of this year, who presents with change in bowel habits.  He has been noticing some urgency with both bowel and bladder and sometimes has some fecal or urinary leakage.  These are new in the past few months.  He does not relate to diet or change in medication.  There is some postprandial urgency at times with defecation.  Stools are intermittently loose and he has seen some black stools.  He has used some Pepto-Bismol but he believes he has had black stools without using that as well.  Stool caliber is diminished and persistent he says.  He has lots of borborygmi and bloating and some mild discomfort in the abdomen.  If stools are not formed and smaller caliber they are loose.  No rectal bleeding.  Remote history of colonoscopy in the 1990s.  He is adopted with respect to family history.  GI review of systems is otherwise negative.  He has seen cardiology regarding first-degree AV block and had a relatively normal echocardiogram with good EF.  He also had become divorced recently and the admission for depression was associated with significant alcohol use and anxiety and brain fog.  Tremor evaluation by Dr. Krista Blue of neurology also.  Autoimmune and [place] screening test negative (labs) THC positive on drug screen. Lab Results  Component Value Date   WBC 6.7 08/22/2021   HGB 16.2 08/22/2021   HCT  48.5 08/22/2021   MCV 86.9 08/22/2021   PLT 298 08/22/2021   Lab Results  Component Value Date   TSH 1.514 08/22/2021     Allergies  Allergen Reactions   Propyphenazone Anaphylaxis    Felt hot   Current Meds  Medication Sig   desvenlafaxine (PRISTIQ) 50 MG 24 hr tablet Take 1 tablet (50 mg total) by mouth daily.   gabapentin (NEURONTIN) 100 MG capsule Take 100 mg by mouth 2 (two) times daily.   mirtazapine (REMERON) 15 MG tablet Take 15 mg by mouth at bedtime.   Na Sulfate-K Sulfate-Mg Sulf 17.5-3.13-1.6 GM/177ML SOLN Take 1 kit by mouth once for 1 dose.   Vitamin D, Ergocalciferol, (DRISDOL) 1.25 MG (50000 UNIT) CAPS capsule Take 1 capsule (50,000 Units total) by mouth every 7 (seven) days.   Past Medical History:  Diagnosis Date   Anxiety    Arrhythmia    [name], recurrent severe, without psychosis ([place]) 08/23/2021   Sleep apnea    Past Surgical History:  Procedure Laterality Date   RHINOPLASTY     SEPTOPLASTY     SHOULDER ARTHROSCOPY Left    SHOULDER ARTHROSCOPY WITH OPEN ROTATOR CUFF REPAIR AND DISTAL CLAVICLE ACROMINECTOMY Right 02/01/2021   Procedure: RIGHT SHOULDER ARTHROSCOPY WITH MINI OPEN ROTATOR CUFF REPAIR AND DISTAL CLAVICLE EXCISION;  Surgeon: Thornton Park, MD;  Location: [facility] ORS;  Service: Orthopedics;  Laterality: Right;   SPINE SURGERY     lumbar  laminectomy   WRIST SURGERY Bilateral    corpal tunnel   Social History   Social History Narrative   Divorced, 2 daughters   Works in Designer, multimedia support for [employer]   1 alcoholic drink a day smokes cigarettes, uses marijuana, 0-1 caffeinated beverages daily   family history is not on file. He was adopted.   Review of Systems As per HPI insomnia depressed mood anxious mood night sweats otherwise as mentioned per HPI.  Objective:   Physical Exam '@BP'  126/80   Pulse 81   Ht '6\' 1"'  (1.854 m)   Wt 246 lb 9.6 oz (111.9 kg)   SpO2 98%   BMI 32.53 kg/m @  General:   Well-developed, well-nourished and in no acute distress Eyes:  anicteric. Lungs: Clear to auscultation bilaterally. Heart:  S1S2, no rubs, murmurs, gallops. Abdomen:  soft, non-tender, no hepatosplenomegaly, hernia, or mass and BS+.  Rectal: Soft brown stool no mass Lymph:  no cervical or supraclavicular adenopathy.  Neuro:  A&O x 3. Sl tremor - diffuse Psych:  appropriate mood and  Affect.   Data Reviewed:  See HPI

## 2021-11-15 NOTE — Patient Instructions (Addendum)
You have been scheduled for a colonoscopy. Please follow written instructions given to you at your visit today.  Please pick up your prep supplies at the pharmacy within the next 1-3 days. If you use inhalers (even only as needed), please bring them with you on the day of your procedure.  Your provider has requested that you go to the basement level for lab work before leaving today. Press "B" on the elevator. The lab is located at the first door on the left as you exit the elevator.   If you are age 78 or older, your body mass index should be between 23-30. Your Body mass index is 32.53 kg/m. If this is out of the aforementioned range listed, please consider follow up with your Primary Care Provider.  If you are age 61 or younger, your body mass index should be between 19-25. Your Body mass index is 32.53 kg/m. If this is out of the aformentioned range listed, please consider follow up with your Primary Care Provider.   ________________________________________________________  The [facility] providers would like to encourage you to use [facility] to communicate with providers for non-urgent requests or questions.  Due to long hold times on the telephone, sending your provider a message by [facility] may be a faster and more efficient way to get a response.  Please allow 48 business hours for a response.  Please remember that this is for non-urgent requests.  _______________________________________________________  Due to recent changes in healthcare laws, you may see the results of your imaging and laboratory studies on MyChart before your provider has had a chance to review them.  We understand that in some cases there may be results that are confusing or concerning to you. Not all laboratory results come back in the same time frame and the provider may be waiting for multiple results in order to interpret others.  Please give [place] 48 hours in order for your provider to thoroughly review all the results  before contacting the office for clarification of your results.   I appreciate the opportunity to care for you. Silvano Rusk, MD, [facility]

## 2021-11-19 ENCOUNTER — Other Ambulatory Visit (INDEPENDENT_AMBULATORY_CARE_PROVIDER_SITE_OTHER): Payer: BC Managed Care – PPO

## 2021-11-19 ENCOUNTER — Ambulatory Visit (AMBULATORY_SURGERY_CENTER): Payer: BC Managed Care – PPO | Admitting: Internal Medicine

## 2021-11-19 ENCOUNTER — Encounter: Payer: Self-pay | Admitting: Internal Medicine

## 2021-11-19 ENCOUNTER — Other Ambulatory Visit: Payer: Self-pay

## 2021-11-19 VITALS — BP 128/74 | HR 67 | Temp 98.0°F | Resp 17 | Ht 73.0 in | Wt 246.0 lb

## 2021-11-19 DIAGNOSIS — K648 Other hemorrhoids: Secondary | ICD-10-CM

## 2021-11-19 DIAGNOSIS — D125 Benign neoplasm of sigmoid colon: Secondary | ICD-10-CM | POA: Diagnosis not present

## 2021-11-19 DIAGNOSIS — K6289 Other specified diseases of anus and rectum: Secondary | ICD-10-CM | POA: Diagnosis not present

## 2021-11-19 DIAGNOSIS — D122 Benign neoplasm of ascending colon: Secondary | ICD-10-CM | POA: Diagnosis not present

## 2021-11-19 DIAGNOSIS — C20 Malignant neoplasm of rectum: Secondary | ICD-10-CM

## 2021-11-19 DIAGNOSIS — K5730 Diverticulosis of large intestine without perforation or abscess without bleeding: Secondary | ICD-10-CM | POA: Diagnosis not present

## 2021-11-19 DIAGNOSIS — R194 Change in bowel habit: Secondary | ICD-10-CM

## 2021-11-19 LAB — COMPREHENSIVE METABOLIC PANEL
ALT: 16 U/L (ref 0–53)
AST: 19 U/L (ref 0–37)
Albumin: 4.1 g/dL (ref 3.5–5.2)
Alkaline Phosphatase: 72 U/L (ref 39–117)
BUN: 9 mg/dL (ref 6–23)
CO2: 27 mEq/L (ref 19–32)
Calcium: 8.9 mg/dL (ref 8.4–10.5)
Chloride: 101 mEq/L (ref 96–112)
Creatinine, Ser: 0.73 mg/dL (ref 0.40–1.50)
GFR: 99.68 mL/min (ref >60.00)
Glucose, Bld: 91 mg/dL (ref 70–99)
Potassium: 3.8 mEq/L (ref 3.5–5.1)
Sodium: 137 mEq/L (ref 135–145)
Total Bilirubin: 0.6 mg/dL (ref 0.2–1.2)
Total Protein: 7.4 g/dL (ref 6.0–8.3)

## 2021-11-19 MED ORDER — SODIUM CHLORIDE 0.9 % IV SOLN: 500.0000 mL | Freq: Once | INTRAVENOUS | Status: DC

## 2021-11-19 NOTE — Progress Notes (Signed)
History and Physical Interval Note:  11/19/2021 8:09 AM  Billy Roman  has presented today for endoscopic procedure(s), with the diagnosis of  Encounter Diagnosis  Name Primary?   Change in bowel habits Yes  .  The various methods of evaluation and treatment have been discussed with the patient and/or family. After consideration of risks, benefits and other options for treatment, the patient has consented to  the endoscopic procedure(s).   The patient's history has been reviewed, patient examined, no change in status, stable for endoscopic procedure(s).  I have reviewed the patient's chart and labs.  Questions were answered to the patient's satisfaction.     Gatha Mayer, MD, Billy Roman

## 2021-11-19 NOTE — Patient Instructions (Addendum)
There is a mass in the rectum that unfortunately looks like cancer. I took biopsies and removed 2 small polyps that look benign, also.  You will have some labs today and we will arrange a CT scan.  I will advise of results and plans in next 1-2 days but anticipate seeing a surgeon (we will refer) and a cancer specialist  (we will refer).  Sorry about this news but now that we have the information we can provide the needed help.  I appreciate the opportunity to care for you. Gatha Mayer, MD, FACG   YOU HAD AN ENDOSCOPIC PROCEDURE TODAY AT [facility]:   Refer to the procedure report that was given to you for any specific questions about what was found during the examination.  If the procedure report does not answer your questions, please call your gastroenterologist to clarify.  If you requested that your care partner not be given the details of your procedure findings, then the procedure report has been included in a sealed envelope for you to review at your convenience later.  YOU SHOULD EXPECT: Some feelings of bloating in the abdomen. Passage of more gas than usual.  Walking can help get rid of the air that was put into your GI tract during the procedure and reduce the bloating. If you had a lower endoscopy (such as a colonoscopy or flexible sigmoidoscopy) you may notice spotting of blood in your stool or on the toilet paper. If you underwent a bowel prep for your procedure, you may not have a normal bowel movement for a few days.  Please Note:  You might notice some irritation and congestion in your nose or some drainage.  This is from the oxygen used during your procedure.  There is no need for concern and it should clear up in a day or so.  SYMPTOMS TO REPORT IMMEDIATELY:  Following lower endoscopy (colonoscopy or flexible sigmoidoscopy):  Excessive amounts of blood in the stool  Significant tenderness or worsening of abdominal pains  Swelling of the abdomen that is  new, acute  Fever of 100F or higher  For urgent or emergent issues, a gastroenterologist can be reached at any hour by calling [phone]. Do not use MyChart messaging for urgent concerns.    DIET:  We do recommend a small meal at first, but then you may proceed to your regular diet.  Drink plenty of fluids but you should avoid alcoholic beverages for 24 hours.  ACTIVITY:  You should plan to take it easy for the rest of today and you should NOT DRIVE or use heavy machinery until tomorrow (because of the sedation medicines used during the test).    FOLLOW UP: Our staff will call the number listed on your records 24-72 hours following your procedure to check on you and address any questions or concerns that you may have regarding the information given to you following your procedure. If we do not reach you, we will leave a message.  We will attempt to reach you two times.  During this call, we will ask if you have developed any symptoms of COVID 19. If you develop any symptoms (ie: fever, flu-like symptoms, shortness of breath, cough etc.) before then, please call [phone].  If you test positive for Covid 19 in the 2 weeks post procedure, please call and report this information to [place].    If any biopsies were taken you will be contacted by phone or by letter within the next  1-3 weeks.  Please call us at [phone] if you have not heard about the biopsies in 3 weeks.    SIGNATURES/CONFIDENTIALITY: You and/or your care partner have signed paperwork which will be entered into your electronic medical record.  These signatures attest to the fact that that the information above on your After Visit Summary has been reviewed and is understood.  Full responsibility of the confidentiality of this discharge information lies with you and/or your care-partner.

## 2021-11-19 NOTE — Progress Notes (Signed)
Report given to PACU, vss 

## 2021-11-19 NOTE — Op Note (Addendum)
[facility] Patient Name: Billy Roman Procedure Date: 11/19/2021 7:22 AM MRN: [id number] Endoscopist: Gatha Mayer , MD Age: 61 Referring MD:  Date of Birth: Jun 24, 1960 Gender: Male Account #: [id number] Procedure:                Colonoscopy Indications:              Change in bowel habits, Change in stool caliber Medicines:                Monitored Anesthesia Care Procedure:                Pre-Anesthesia Assessment:                           - Prior to the procedure, a History and Physical                            was performed, and patient medications and                            allergies were reviewed. The patient's tolerance of                            previous anesthesia was also reviewed. The risks                            and benefits of the procedure and the sedation                            options and risks were discussed with the patient.                            All questions were answered, and informed consent                            was obtained. Prior Anticoagulants: The patient has                            taken no previous anticoagulant or antiplatelet                            agents. ASA Grade Assessment: II - A patient with                            mild systemic disease. After reviewing the risks                            and benefits, the patient was deemed in                            satisfactory condition to undergo the procedure.                           After obtaining informed consent, the colonoscope  was passed under direct vision. Throughout the                            procedure, the patient's blood pressure, pulse, and                            oxygen saturations were monitored continuously. The                            CF [id number] #[id number] was introduced through the anus                            and advanced to the the cecum, identified by                            appendiceal  orifice and ileocecal valve. The                            colonoscopy was somewhat difficult due to poor                            endoscopic visualization. The patient tolerated the                            procedure well. The quality of the bowel                            preparation was fair. The bowel preparation used                            was SUPREP via split dose instruction. Scope In: 8:18:41 AM Scope Out: 8:40:08 AM Scope Withdrawal Time: 0 hours 15 minutes 58 seconds  Total Procedure Duration: 0 hours 21 minutes 27 seconds  Findings:                 The perianal and digital rectal examinations were                            normal.                           An infiltrative, polypoid and ulcerated partially                            obstructing large mass was found in the rectum. The                            mass was circumferential. The mass measured seven                            cm in length. Biopsies were taken with a cold                            forceps for histology. Verification of patient  identification for the specimen was done. Estimated                            blood loss was minimal.                           Two sessile polyps were found in the sigmoid colon                            and ascending colon. The polyps were 5 mm in size.                            These polyps were removed with a cold snare.                            Resection and retrieval were complete. Verification                            of patient identification for the specimen was                            done. Estimated blood loss was minimal.                           Diverticula were found in the sigmoid colon.                           External and internal hemorrhoids were found.                           The exam was otherwise without abnormality on                            direct and retroflexion views. Complications:            No  immediate complications. Estimated Blood Loss:     Estimated blood loss was minimal. Impression:               - Preparation of the colon was fair.                           - Malignant partially obstructing tumor in the                            rectum. Biopsied. 12-19 cm estimated location - not                            palpable w/ finger. TATTOO x 2 distal to tumor                            placed in rectum w/ SPOT                           - Two 5 mm polyps in the sigmoid colon and in the  ascending colon, removed with a cold snare.                            Resected and retrieved.                           - Diverticulosis in the sigmoid colon.                           - External and internal hemorrhoids.                           - The examination was otherwise normal on direct                            and retroflexion views. Recommendation:           - Patient has a contact number available for                            emergencies. The signs and symptoms of potential                            delayed complications were discussed with the                            patient. Return to normal activities tomorrow.                            Written discharge instructions were provided to the                            patient.                           - Resume previous diet.                           - Continue present medications.                           - Await pathology results.                           - CMET and CEA today - CBC last week was NL                           Office to schedule CT abd/pelvisw/ contrsat (Rectal                            mass/cancer) ALSO NEEDS CHEST CT WITH ABD/PELVIS Gatha Mayer, MD 11/19/2021 8:52:09 AM This report has been signed electronically.

## 2021-11-19 NOTE — Progress Notes (Signed)
Called to room to assist during endoscopic procedure.  Patient ID and intended procedure confirmed with present staff. Received instructions for my participation in the procedure from the performing physician.  

## 2021-11-20 ENCOUNTER — Telehealth: Payer: Self-pay

## 2021-11-20 ENCOUNTER — Telehealth: Payer: Self-pay | Admitting: *Deleted

## 2021-11-20 ENCOUNTER — Encounter: Payer: Self-pay | Admitting: *Deleted

## 2021-11-20 DIAGNOSIS — C20 Malignant neoplasm of rectum: Secondary | ICD-10-CM

## 2021-11-20 HISTORY — DX: Malignant neoplasm of rectum: C20

## 2021-11-20 LAB — CEA: CEA: 2.9 ng/mL — ABNORMAL HIGH

## 2021-11-20 NOTE — Telephone Encounter (Signed)
Inbound call from patient. Patient states he is not feeling well . Patient states he would like to speak with a nurse regarding some questions he has. Please give patient a call back to advise.  Thank You

## 2021-11-20 NOTE — Telephone Encounter (Signed)
I let him know that the pathology confirmed my suspicion of rectal cancer.  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  He is supposed to go back to work June 10.  I think given his overall situation it is reasonable and I am willing to extend his leave for 1 more week.  He will try to get me the papers or find out how I need to do that.  I explained to him that I am out of the office Thursday and Friday so tomorrow is really our only chance on that.   He needs:  Referral to [facility] surgery Dr. Dema Severin or Dr. Marcello Moores (colorectal surgeons) regarding rectal cancer  Referral to medical oncology Dr. Benay Spice or Dr. Burr Medico regarding rectal cancer

## 2021-11-20 NOTE — Telephone Encounter (Signed)
Called the patient back he has ome questions regarding treatment going forward after his colonoscopy findings yesterday. He reports not feeling well today, just "lethargic and a little nauseous." He is also concerned that his CT scan isn't scheduled until the beginning of September and he is asking about extending his Medical leave of absence that is up next Billy Roman June 12. I reassured him that Dr. Carlean Purl will be in touch with him about the biopsy results as soon as we have them. Will inform Dr. Carlean Purl of his concerns.

## 2021-11-20 NOTE — Telephone Encounter (Signed)
Was going to attempt 2nd f/u phone call and realized pt had called with concerns. Another RN has relayed pt's concerns to Dr. Carlean Purl, awaiting his response. Did not speak to pt with this encounter.

## 2021-11-20 NOTE — Telephone Encounter (Signed)
Called [phone] and left a message we tried to reach pt for a follow up call. maw

## 2021-11-20 NOTE — Telephone Encounter (Signed)
Per 11/19/21 procedure report - Office to schedule CT chest/ abd/pelvis with contrast (rectal mass/cancer).   CT appt is scheduled for 11/29/21

## 2021-11-21 ENCOUNTER — Other Ambulatory Visit: Payer: Self-pay

## 2021-11-21 DIAGNOSIS — C20 Malignant neoplasm of rectum: Secondary | ICD-10-CM

## 2021-11-21 DIAGNOSIS — F331 Major depressive disorder, recurrent, moderate: Secondary | ICD-10-CM | POA: Diagnosis not present

## 2021-11-21 NOTE — Telephone Encounter (Signed)
Referral to both [facility]  & oncology have been placed. Patient has been notified & advised to call our office in two weeks if he has not heard from either office.

## 2021-11-23 ENCOUNTER — Telehealth: Payer: Self-pay | Admitting: Internal Medicine

## 2021-11-23 DIAGNOSIS — F1994 Other psychoactive substance use, unspecified with psychoactive substance-induced mood disorder: Secondary | ICD-10-CM | POA: Diagnosis not present

## 2021-11-23 DIAGNOSIS — F331 Major depressive disorder, recurrent, moderate: Secondary | ICD-10-CM | POA: Diagnosis not present

## 2021-11-23 NOTE — Telephone Encounter (Signed)
I have placed the FMLA paperwork on Dr Celesta Aver desk to be completed. I printed out his office visits, and procedure/path reports.

## 2021-11-23 NOTE — Telephone Encounter (Signed)
Pt came to the office to fill out inpt and record release form for his FMLA application. They will be given to [facility].

## 2021-11-26 NOTE — Telephone Encounter (Signed)
I informed Laurie and I will keep him updated. He will call [employer] and tell them.

## 2021-11-27 NOTE — Telephone Encounter (Signed)
FMLA paperwork faxed to # [phone] [facility] [employer] Claims. Confirmation that form went thru. Will have the FMLA form scanned into epic. I informed Talbot and he said Thank you.

## 2021-11-29 ENCOUNTER — Ambulatory Visit (HOSPITAL_COMMUNITY)
Admission: RE | Admit: 2021-11-29 | Discharge: 2021-11-29 | Disposition: A | Discharge status: Home / Self Care | Payer: BC Managed Care – PPO | Source: Ambulatory Visit | Attending: Internal Medicine | Admitting: Internal Medicine

## 2021-11-29 DIAGNOSIS — C20 Malignant neoplasm of rectum: Secondary | ICD-10-CM

## 2021-11-29 DIAGNOSIS — I700 Atherosclerosis of aorta: Secondary | ICD-10-CM | POA: Diagnosis not present

## 2021-11-29 DIAGNOSIS — Z85048 Personal history of other malignant neoplasm of rectum, rectosigmoid junction, and anus: Secondary | ICD-10-CM | POA: Diagnosis not present

## 2021-11-29 DIAGNOSIS — R590 Localized enlarged lymph nodes: Secondary | ICD-10-CM | POA: Diagnosis not present

## 2021-11-29 DIAGNOSIS — K6389 Other specified diseases of intestine: Secondary | ICD-10-CM | POA: Diagnosis not present

## 2021-11-29 DIAGNOSIS — R918 Other nonspecific abnormal finding of lung field: Secondary | ICD-10-CM | POA: Diagnosis not present

## 2021-11-29 IMAGING — CT CT CHEST-ABD-PELV W/ CM
2 of 7 series · 9 of 36 positions shown, 15 images · IV contrast (APPLIED)
Comparison: No priors.

CLINICAL DATA: 61-year-old male with history of rectal cancer.
Staging examination. * Tracking Code: BO *

EXAM:
CT CHEST, ABDOMEN, AND PELVIS WITH CONTRAST
TECHNIQUE: Multidetector CT imaging of the chest, abdomen and pelvis was
performed following the standard protocol during bolus
administration of intravenous contrast.

[Series 4: cap with · axial · 0.98mm/px · z∈[+1011,+1561]mm · 8 of 142 slices shown, 13 images]
[im 16/142  mediastinal]
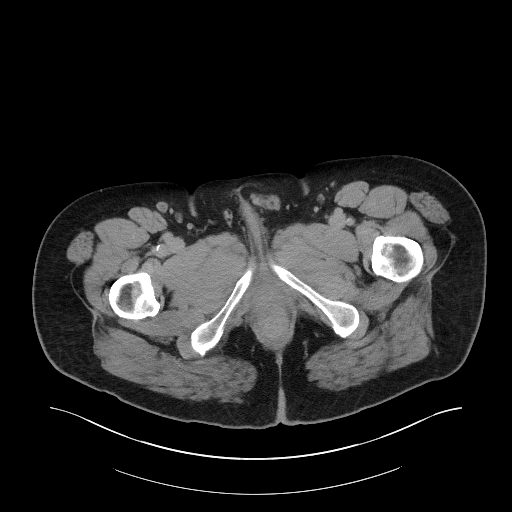
[im 16/142  bone]
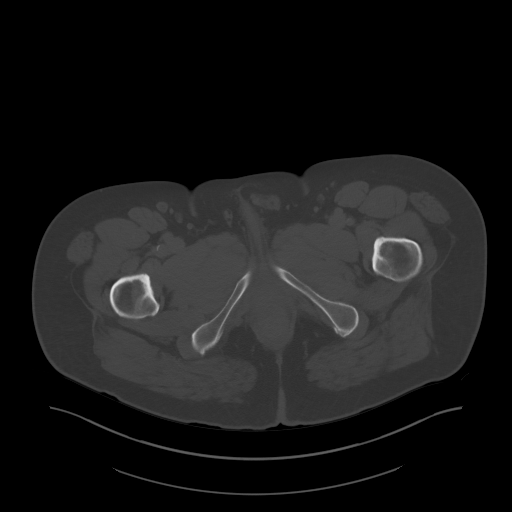
[im 32/142  mediastinal]
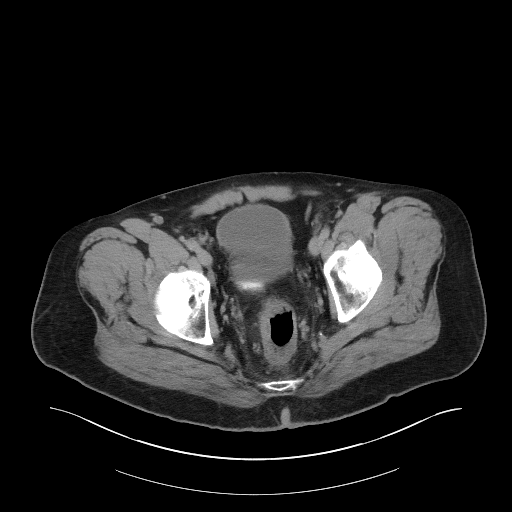
[im 48/142  mediastinal]
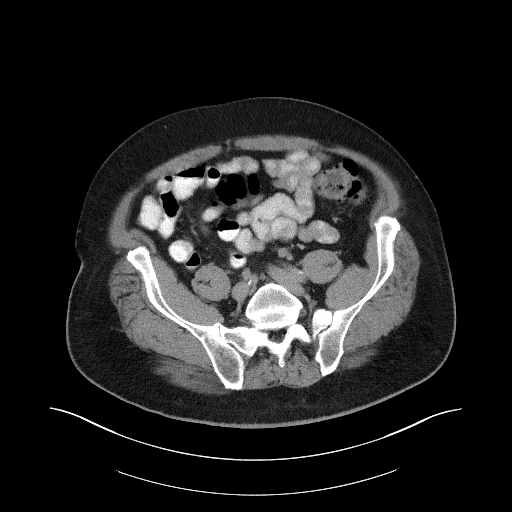
[im 63/142  mediastinal]
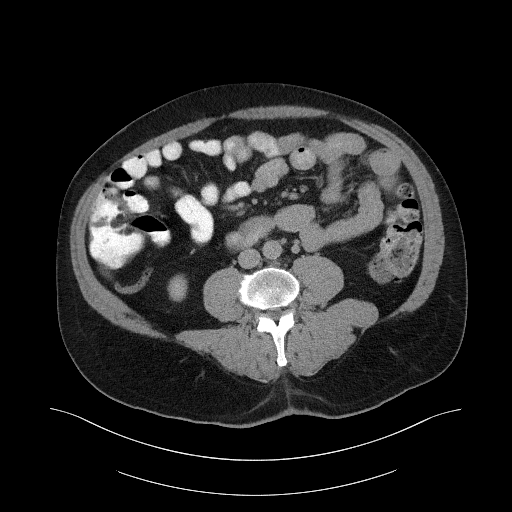
[im 79/142  mediastinal]
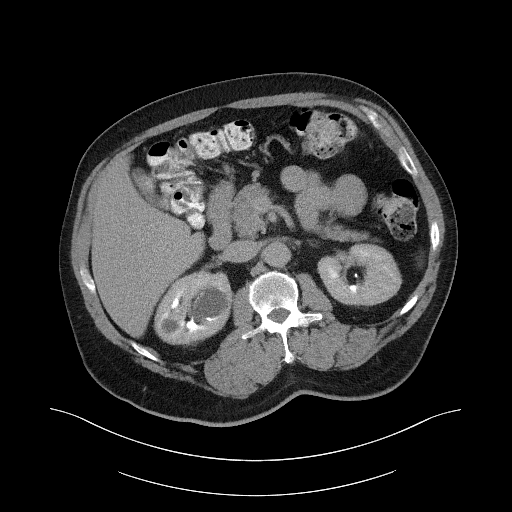
[im 79/142  lung]
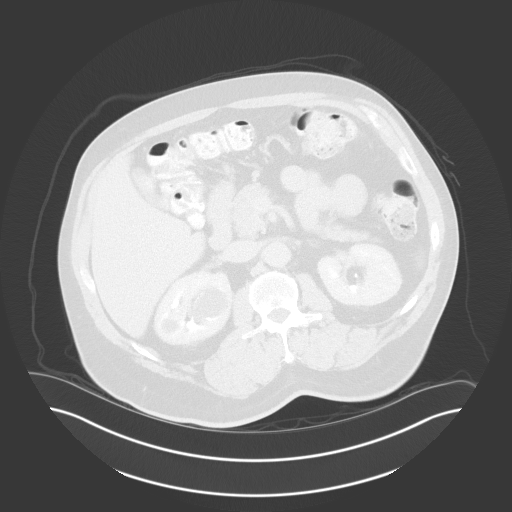
[im 95/142  mediastinal]
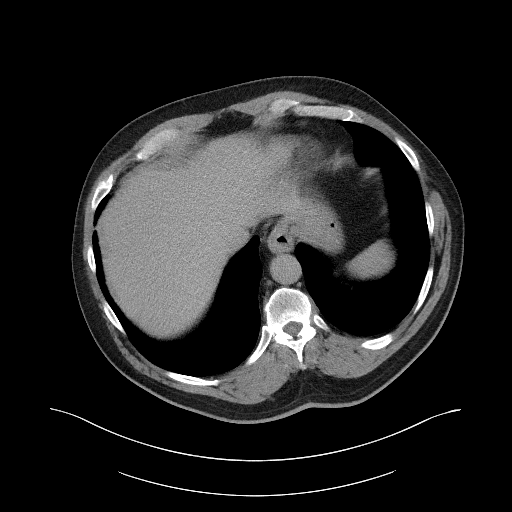
[im 95/142  lung]
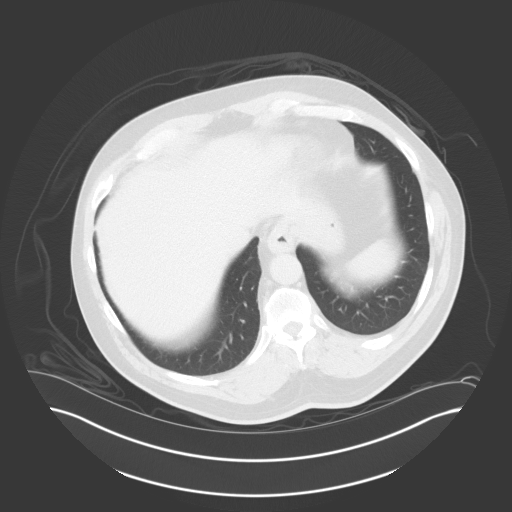
[im 110/142  mediastinal]
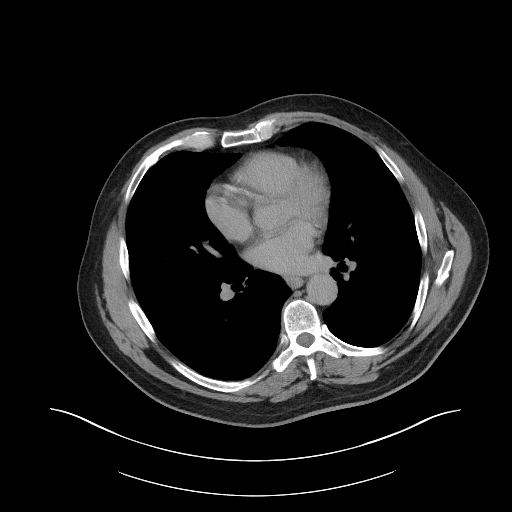
[im 110/142  lung]
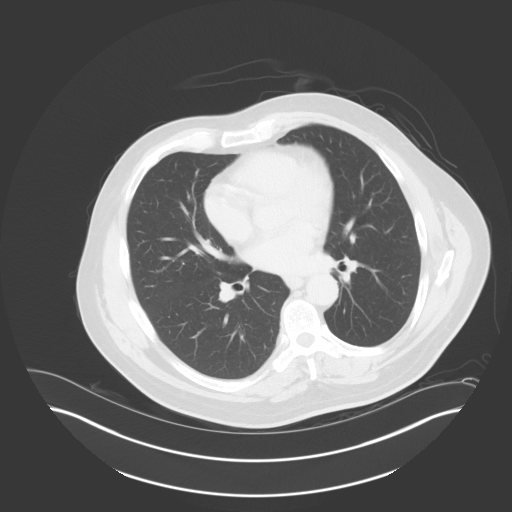
[im 126/142  mediastinal]
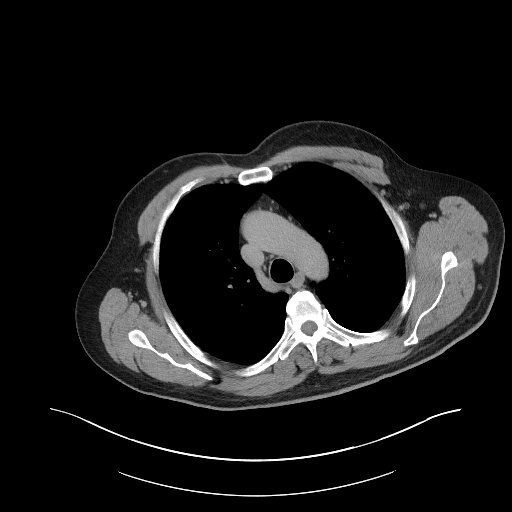
[im 126/142  lung]
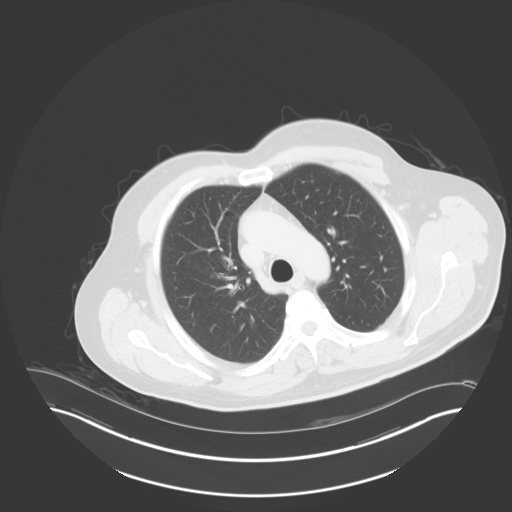

[Series 7: coronals · coronal · 0.88mm/px · 1 of 188 slices shown, 2 images]
[im 94/188  mediastinal]
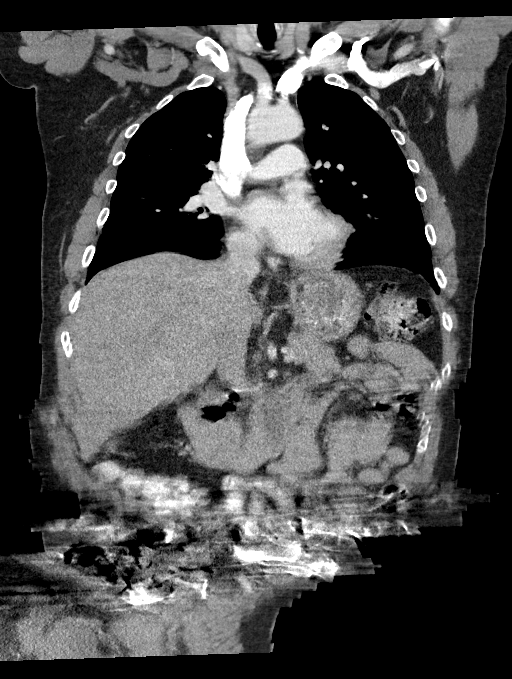
[im 94/188  bone]
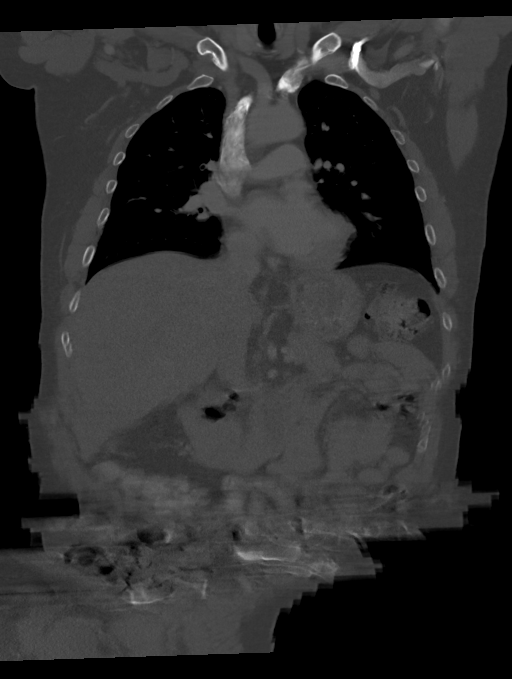

[9 of 36 positions shown; findings below may reference images not displayed]

RADIATION DOSE REDUCTION: This exam was performed according to the
departmental dose-optimization program which includes automated
exposure control, adjustment of the mA and/or kV according to
patient size and/or use of iterative reconstruction technique.

CONTRAST:  100mL OMNIPAQUE IOHEXOL 300 MG/ML  SOLN
FINDINGS: Comment: Significant portions of today's examination are severely
limited by a large amount of patient motion.

CT CHEST FINDINGS

Cardiovascular: Heart size is normal. There is no significant
pericardial fluid, thickening or pericardial calcification. There is
aortic atherosclerosis, as well as atherosclerosis of the great
vessels of the mediastinum and the coronary arteries, including
calcified atherosclerotic plaque in the left anterior descending,
left circumflex and right coronary arteries.

Mediastinum/Nodes: No pathologically enlarged mediastinal or hilar
lymph nodes. Esophagus is unremarkable in appearance. No axillary
lymphadenopathy.

Lungs/Pleura: A few scattered small pulmonary nodules are noted,
largest of which is in the posterior aspect of the right upper lobe
(axial image 35 of series 6) measuring 5 mm. No other larger more
suspicious appearing pulmonary nodules or masses. No acute
consolidative airspace disease. No pleural effusions.

Musculoskeletal: There are no aggressive appearing lytic or blastic
lesions noted in the visualized portions of the skeleton.

CT ABDOMEN PELVIS FINDINGS

Hepatobiliary: No suspicious cystic or solid hepatic lesions. No
intra or extrahepatic biliary ductal dilatation. Gallbladder is
nearly completely decompressed, but otherwise unremarkable in
appearance.

Pancreas: No pancreatic mass. No pancreatic ductal dilatation. No
pancreatic or peripancreatic fluid collections or inflammatory
changes.

Spleen: Unremarkable.

Adrenals/Urinary Tract: Low-attenuation lesions in the right kidney,
compatible with simple cysts, largest of which is a 2.5 x 3.8 cm
low-attenuation lesion in the upper pole of the right kidney (no
imaging follow-up is recommended). Left kidney and bilateral adrenal
glands are normal in appearance. No hydroureteronephrosis. Urinary
bladder is unremarkable in appearance.

Stomach/Bowel: The appearance of the stomach is normal. There is no
pathologic dilatation of small bowel or colon. Lower abdomen and
pelvic images are substantially limited by patient motion on the
portal venous phase of the examination, however, delayed imaging
through this region was performed, which demonstrates rather diffuse
mural thickening in the distal sigmoid colon and proximal rectum,
suggesting an infiltrative mass, although no discrete measurable
mass is confidently identified. Diffuse haziness in the mesorectal
fat along with numerous enlarged mesorectal lymph nodes are noted,
measuring up to 1.3 cm in short axis (axial image 106 of series 4),
suggesting extensive locoregional nodal metastasis. No abdominal
lymphadenopathy noted.

Vascular/Lymphatic: Aortic atherosclerosis, without evidence of
aneurysm or dissection in the abdominal or pelvic vasculature.
Numerous prominent borderline enlarged and mildly enlarged
mesorectal lymph nodes, as detailed above.

Reproductive: Prostate gland and seminal vesicles are unremarkable
in appearance.

Other: No significant volume of ascites.  No pneumoperitoneum.

Musculoskeletal: There are no aggressive appearing lytic or blastic
lesions noted in the visualized portions of the skeleton.
IMPRESSION: 1. Extensive mural thickening in the distal sigmoid colon and
proximal rectum suggesting infiltrative colorectal neoplasm with
haziness of the mesorectal fat and adjacent mesorectal
lymphadenopathy concerning for local direct invasion and nodal
disease.
2. Small pulmonary nodules measuring 5 mm or less in the lungs,
nonspecific. Metastatic disease is not excluded, but not favored on
the basis of today's examination. Attention on follow-up studies is
recommended to ensure stability.
3. No definite metastatic disease to the abdomen.
4. Aortic atherosclerosis, in addition to three-vessel coronary
artery disease. Please note that although the presence of coronary
artery calcium documents the presence of coronary artery disease,
the severity of this disease and any potential stenosis cannot be
assessed on this non-gated CT examination. Assessment for potential
risk factor modification, dietary therapy or pharmacologic therapy
may be warranted, if clinically indicated.
5. Additional incidental findings, as above.

## 2021-11-29 MED ORDER — IOHEXOL 300 MG/ML  SOLN
100.0000 mL | Freq: Once | INTRAMUSCULAR | Status: AC | PRN
  Administered 2021-11-29: 100 mL via INTRAVENOUS

## 2021-11-30 ENCOUNTER — Encounter: Payer: Self-pay | Admitting: *Deleted

## 2021-11-30 ENCOUNTER — Other Ambulatory Visit: Payer: Self-pay | Admitting: *Deleted

## 2021-11-30 ENCOUNTER — Inpatient Hospital Stay: Payer: BC Managed Care – PPO | Attending: Oncology | Admitting: Oncology

## 2021-11-30 VITALS — BP 129/81 | HR 85 | Temp 98.1°F | Resp 18 | Ht 73.0 in | Wt 247.2 lb

## 2021-11-30 DIAGNOSIS — C20 Malignant neoplasm of rectum: Secondary | ICD-10-CM | POA: Insufficient documentation

## 2021-11-30 DIAGNOSIS — R140 Abdominal distension (gaseous): Secondary | ICD-10-CM | POA: Insufficient documentation

## 2021-11-30 DIAGNOSIS — F17210 Nicotine dependence, cigarettes, uncomplicated: Secondary | ICD-10-CM

## 2021-11-30 DIAGNOSIS — F329 Major depressive disorder, single episode, unspecified: Secondary | ICD-10-CM | POA: Insufficient documentation

## 2021-11-30 DIAGNOSIS — C779 Secondary and unspecified malignant neoplasm of lymph node, unspecified: Secondary | ICD-10-CM

## 2021-11-30 DIAGNOSIS — H532 Diplopia: Secondary | ICD-10-CM | POA: Insufficient documentation

## 2021-11-30 NOTE — Progress Notes (Signed)
[place] New Patient Consult   Requesting MD: Billy Mayer, Md [address],  [place] [address]   Billy Roman 61 y.o.  10-19-1960    Reason for Consult: Colorectal cancer   HPI: [name] reports abdominal discomfort, fatigue, and dark stools since the spring 2022.  He was referred to Dr. Carlean Roman earlier this month.  He was taken to a colonoscopy on 11/19/2021.  An infiltrative ulcerated mass was found in the rectum.  Biopsies were obtained.  2 sessile polyps were found in the sigmoid and ascending colon.  The polyps were removed.  The mass was estimated to be between 12 and 19 cm.  The area was tattooed.  The pathology revealed tubular adenomas at the sigmoid colon and ascending colon.  The rectum mass biopsy returned as moderate to poorly differentiated adenocarcinoma.  CTs of the chest, abdomen, and pelvis on 11/29/2021 revealed a few small pulmonary nodules the largest measuring 5 mm in the posterior right upper lobe.  No suspicious liver lesions.  There is diffuse mural thickening of the distal sigmoid colon and proximal rectum without a discrete measurable mass.  Diffuse haziness in the mesorectal fat with numerous enlarged mesorectal nodes measuring up to 1.3 cm.  Past Medical History:  Diagnosis Date   Anxiety    Arrhythmia    [name], recurrent severe, without psychosis ([place]) 08/23/2021   Rectal cancer ([place]) 11/20/2021   Sleep apnea     .  "Neuropathy "in the left foot and fingers   .  Tremors of the hands  Past Surgical History:  Procedure Laterality Date   RHINOPLASTY     SEPTOPLASTY     SHOULDER ARTHROSCOPY Left    SHOULDER ARTHROSCOPY WITH OPEN ROTATOR CUFF REPAIR AND DISTAL CLAVICLE ACROMINECTOMY Right 02/01/2021   Procedure: RIGHT SHOULDER ARTHROSCOPY WITH MINI OPEN ROTATOR CUFF REPAIR AND DISTAL CLAVICLE EXCISION;  Surgeon: Thornton Park, MD;  Location: [facility] ORS;  Service: Orthopedics;  Laterality:  Right;   SPINE SURGERY     lumbar laminectomy   WRIST SURGERY Bilateral    corpal tunnel    Medications: Reviewed  Allergies:  Allergies  Allergen Reactions   Propyphenazone Anaphylaxis    Felt hot    Family history: He is adopted.  Social History:   He lives alone in [place].  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  He smokes 1/2-1 pack of cigarettes per day for many years.  He has a liquor drink every other day.  No transfusion history.  No risk factor for HIV or hepatitis.  ROS:   Positives include: Intermittent night sweats, intermittent nausea, anorexia with 60 pound weight loss beginning early 2022-weight increased when he started mirtazapine, tremors in the left greater than right hand, blurred vision and diplopia, recent "infected tooth ", low back pain, malaise  A complete ROS was otherwise negative.  Physical Exam:  Blood pressure 129/81, pulse 85, temperature 98.1 F (36.7 C), temperature source Oral, resp. rate 18, height '6\' 1"'$  (1.854 m), weight 247 lb 3.2 oz (112.1 kg), SpO2 98 %.  HEENT: Poor dentition, oropharynx without visible mass, neck without mass Lungs: Distant breath sounds, no respiratory distress Cardiac: Regular rate and rhythm Abdomen: No hepatosplenomegaly, no mass, nontender GU: Testes without mass Vascular: No leg edema Lymph nodes: No cervical, supraclavicular, axillary, or inguinal nodes Neurologic: Alert and oriented, the motor exam appears intact in the upper and lower extremities bilaterally Skin: No rash Musculoskeletal: Tender at  the low thoracic/upper lumbar spine   LAB:  CBC  Lab Results  Component Value Date   WBC 7.5 11/15/2021   HGB 15.6 11/15/2021   HCT 46.9 11/15/2021   MCV 87.5 11/15/2021   PLT 307.0 11/15/2021   NEUTROABS 4.7 11/15/2021        CMP  Lab Results  Component Value Date   NA 137 11/19/2021   K 3.8 11/19/2021   CL 101 11/19/2021   CO2 27 11/19/2021   GLUCOSE 91 11/19/2021   BUN 9  11/19/2021   CREATININE 0.73 11/19/2021   CALCIUM 8.9 11/19/2021   PROT 7.4 11/19/2021   ALBUMIN 4.1 11/19/2021   AST 19 11/19/2021   ALT 16 11/19/2021   ALKPHOS 72 11/19/2021   BILITOT 0.6 11/19/2021   GFRNONAA >60 08/22/2021   GFRAA 117 07/26/2019   CEA 2.9 on 11/19/2021  Imaging:  CT CHEST ABDOMEN PELVIS W CONTRAST  Result Date: 11/30/2021 CLINICAL DATA:  61 year old male with history of rectal cancer. Staging examination. * Tracking Code: BO * EXAM: CT CHEST, ABDOMEN, AND PELVIS WITH CONTRAST TECHNIQUE: Multidetector CT imaging of the chest, abdomen and pelvis was performed following the standard protocol during bolus administration of intravenous contrast. RADIATION DOSE REDUCTION: This exam was performed according to the departmental dose-optimization program which includes automated exposure control, adjustment of the mA and/or kV according to patient size and/or use of iterative reconstruction technique. CONTRAST:  153m OMNIPAQUE IOHEXOL 300 MG/ML  SOLN COMPARISON:  No priors. FINDINGS: Comment: Significant portions of today's examination are severely limited by a large amount of patient motion. CT CHEST FINDINGS Cardiovascular: Heart size is normal. There is no significant pericardial fluid, thickening or pericardial calcification. There is aortic atherosclerosis, as well as atherosclerosis of the great vessels of the mediastinum and the coronary arteries, including calcified atherosclerotic plaque in the left anterior descending, left circumflex and right coronary arteries. Mediastinum/Nodes: No pathologically enlarged mediastinal or hilar lymph nodes. Esophagus is unremarkable in appearance. No axillary lymphadenopathy. Lungs/Pleura: A few scattered small pulmonary nodules are noted, largest of which is in the posterior aspect of the right upper lobe (axial image 35 of series 6) measuring 5 mm. No other larger more suspicious appearing pulmonary nodules or masses. No acute consolidative  airspace disease. No pleural effusions. Musculoskeletal: There are no aggressive appearing lytic or blastic lesions noted in the visualized portions of the skeleton. CT ABDOMEN PELVIS FINDINGS Hepatobiliary: No suspicious cystic or solid hepatic lesions. No intra or extrahepatic biliary ductal dilatation. Gallbladder is nearly completely decompressed, but otherwise unremarkable in appearance. Pancreas: No pancreatic mass. No pancreatic ductal dilatation. No pancreatic or peripancreatic fluid collections or inflammatory changes. Spleen: Unremarkable. Adrenals/Urinary Tract: Low-attenuation lesions in the right kidney, compatible with simple cysts, largest of which is a 2.5 x 3.8 cm low-attenuation lesion in the upper pole of the right kidney (no imaging follow-up is recommended). Left kidney and bilateral adrenal glands are normal in appearance. No hydroureteronephrosis. Urinary bladder is unremarkable in appearance. Stomach/Bowel: The appearance of the stomach is normal. There is no pathologic dilatation of small bowel or colon. Lower abdomen and pelvic images are substantially limited by patient motion on the portal venous phase of the examination, however, delayed imaging through this region was performed, which demonstrates rather diffuse mural thickening in the distal sigmoid colon and proximal rectum, suggesting an infiltrative mass, although no discrete measurable mass is confidently identified. Diffuse haziness in the mesorectal fat along with numerous enlarged mesorectal lymph nodes are noted, measuring up  to 1.3 cm in short axis (axial image 106 of series 4), suggesting extensive locoregional nodal metastasis. No abdominal lymphadenopathy noted. Vascular/Lymphatic: Aortic atherosclerosis, without evidence of aneurysm or dissection in the abdominal or pelvic vasculature. Numerous prominent borderline enlarged and mildly enlarged mesorectal lymph nodes, as detailed above. Reproductive: Prostate gland and  seminal vesicles are unremarkable in appearance. Other: No significant volume of ascites.  No pneumoperitoneum. Musculoskeletal: There are no aggressive appearing lytic or blastic lesions noted in the visualized portions of the skeleton. IMPRESSION: 1. Extensive mural thickening in the distal sigmoid colon and proximal rectum suggesting infiltrative colorectal neoplasm with haziness of the mesorectal fat and adjacent mesorectal lymphadenopathy concerning for local direct invasion and nodal disease. 2. Small pulmonary nodules measuring 5 mm or less in the lungs, nonspecific. Metastatic disease is not excluded, but not favored on the basis of today's examination. Attention on follow-up studies is recommended to ensure stability. 3. No definite metastatic disease to the abdomen. 4. Aortic atherosclerosis, in addition to three-vessel coronary artery disease. Please note that although the presence of coronary artery calcium documents the presence of coronary artery disease, the severity of this disease and any potential stenosis cannot be assessed on this non-gated CT examination. Assessment for potential risk factor modification, dietary therapy or pharmacologic therapy may be warranted, if clinically indicated. 5. Additional incidental findings, as above. Electronically Signed   By: Vinnie Langton M.D.   On: 11/30/2021 08:54      Assessment/Plan:   Adenocarcinoma of the proximal rectum/distal sigmoid Colonoscopy 11/19/2021-partially obstructing mass at 12-19 cm, biopsy moderate to poorly differentiated adenocarcinoma Mildly elevated CEA CTs 11/29/2021-mural thickening in the distal sigmoid/proximal rectum with haziness of the mesorectal fat, mesorectal lymphadenopathy, small pulmonary nodules favored to be benign Major depressive disorder Change in bowel habits and rectal bleeding secondary to #1 Ongoing tobacco use Report of blurred vision and diplopia   Disposition:   Mr. Crisp has been diagnosed  with adenocarcinoma of the proximal rectum/distal sigmoid colon.  I reviewed the colonoscopy findings and CT images with Mr. Mandler and his daughters.  It is not clear whether the tumor starts in the distal sigmoid colon versus proximal rectum.  We discussed treatment of rectal and colon cancer.  He is scheduled to see Dr. Dema Severin next week.  He will be referred for a pelvic MRI to establish the primary tumor location and clinical stage.  He appears to have lymph node involvement.  I will consult with Dr. Dema Severin.  His case will be presented at the GI tumor conference.  We will decide on neoadjuvant therapy versus upfront surgery after determining the primary tumor location.  He will be tested for hereditary nonpolyposis colon cancer on the resected tumor specimen.  His family members are increased risk of developing colorectal cancer and should receive appropriate screening.  He will return for an office visit and further discussion in 1 week.  [name], MD  11/30/2021, 2:18 PM

## 2021-11-30 NOTE — Progress Notes (Unsigned)
PATIENT NAVIGATOR PROGRESS NOTE  Name: Billy Roman Date: 11/30/2021 MRN: [id number]  DOB: 05/09/1961   Reason for visit:  Initial visit with Dr Benay Spice   Comments:  Met with pt and his two daughters during visit with Dr Benay Spice. Pelvic MRI ordered to assist in staging of disease Pt is meeting with Dr Dema Severin on 6/20 to discuss surgery options Present at GI conference for 6/28 RTC on 6/28 to discuss plan of care Provided contact information to call with any issues or questions    Time spent counseling/coordinating care: > 60 minutes

## 2021-12-02 ENCOUNTER — Encounter (HOSPITAL_COMMUNITY): Payer: Self-pay

## 2021-12-02 ENCOUNTER — Ambulatory Visit (HOSPITAL_COMMUNITY)
Admission: RE | Admit: 2021-12-02 | Discharge: 2021-12-02 | Disposition: A | Discharge status: Home / Self Care | Payer: BC Managed Care – PPO | Source: Ambulatory Visit | Attending: Oncology | Admitting: Oncology

## 2021-12-02 DIAGNOSIS — C20 Malignant neoplasm of rectum: Secondary | ICD-10-CM

## 2021-12-03 ENCOUNTER — Ambulatory Visit (HOSPITAL_COMMUNITY): Admission: RE | Admit: 2021-12-03 | Payer: BC Managed Care – PPO | Source: Ambulatory Visit

## 2021-12-03 ENCOUNTER — Telehealth: Payer: Self-pay

## 2021-12-03 ENCOUNTER — Other Ambulatory Visit: Payer: BC Managed Care – PPO

## 2021-12-03 ENCOUNTER — Ambulatory Visit (HOSPITAL_COMMUNITY)
Admission: RE | Admit: 2021-12-03 | Discharge: 2021-12-03 | Disposition: A | Discharge status: Home / Self Care | Payer: BC Managed Care – PPO | Source: Ambulatory Visit | Attending: Oncology | Admitting: Oncology

## 2021-12-03 DIAGNOSIS — C20 Malignant neoplasm of rectum: Secondary | ICD-10-CM | POA: Diagnosis not present

## 2021-12-03 IMAGING — MR MR PELVIS WO/W CM
4 of 11 series · 19 of 48 positions shown · IV contrast (10ml gadavist)
Comparison: None Available.

CLINICAL DATA: Rectal carcinoma

EXAM:
MRI PELVIS WITHOUT AND WITH CONTRAST
TECHNIQUE: Multiplanar multisequence MR imaging of the pelvis was performed
both before and after administration of intravenous contrast.
Ultrasound gel was administered per rectum to optimize tumor
evaluation.
CONTRAST:  10mL GADAVIST GADOBUTROL 1 MMOL/ML IV SOLN

[Series 2: T2 · axial · 5.0mm · 0.47mm/px · z∈[-33,+213]mm · 5 of 42 slices shown (1 of 4)]
[im 1/42]
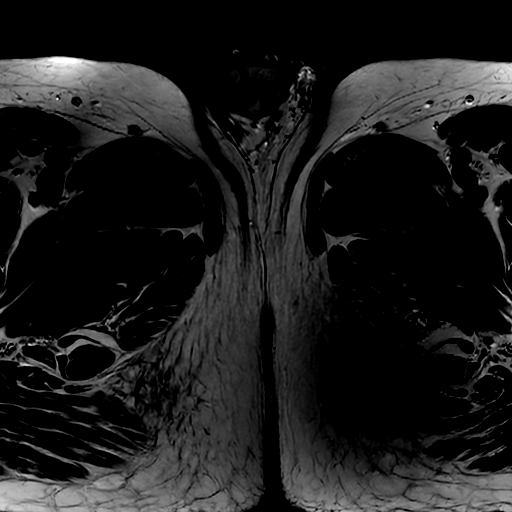
[im 11/42]
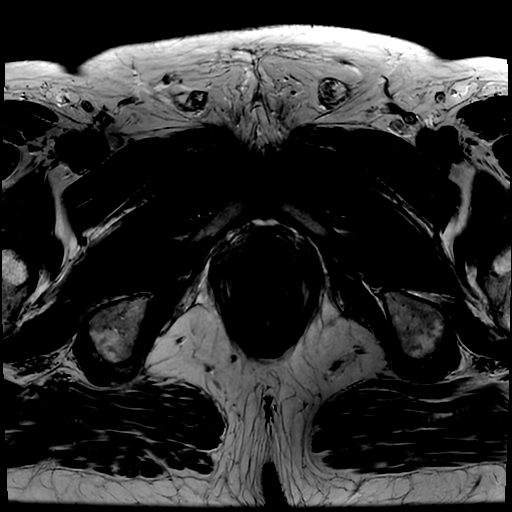
[im 21/42]
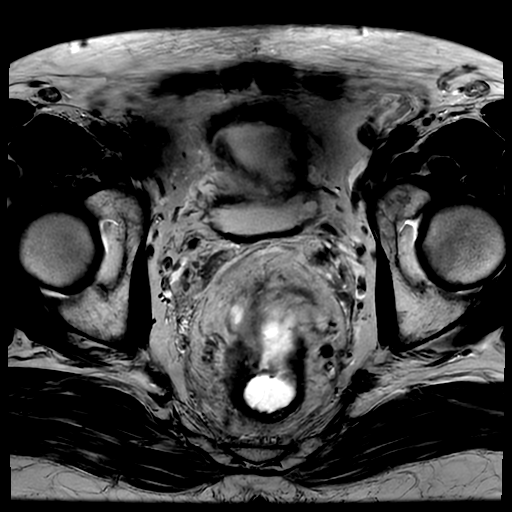
[im 31/42]
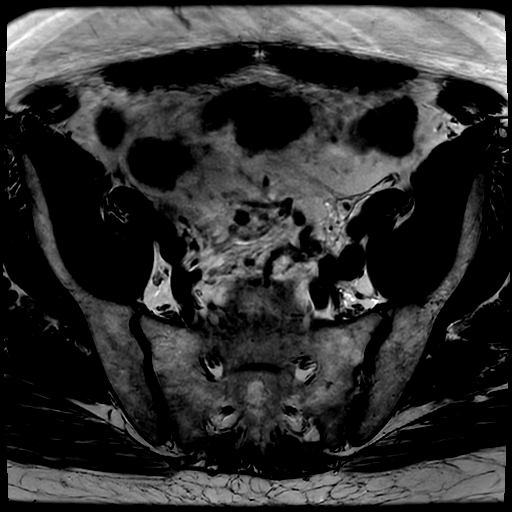
[im 42/42]
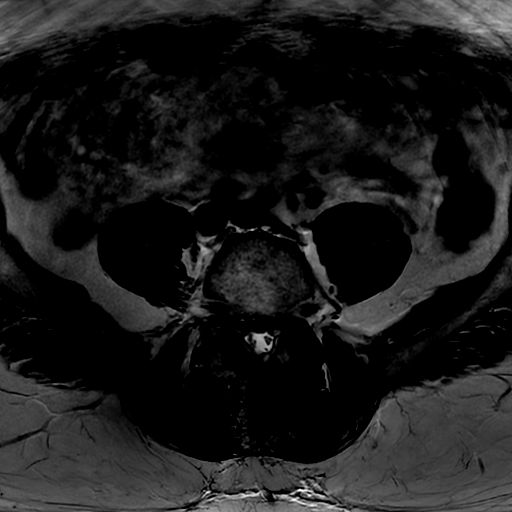

[Series 3: T2 · sagittal · 4.0mm · 0.47mm/px · 4 of 39 slices shown (2 of 4)]
[im 1/39]
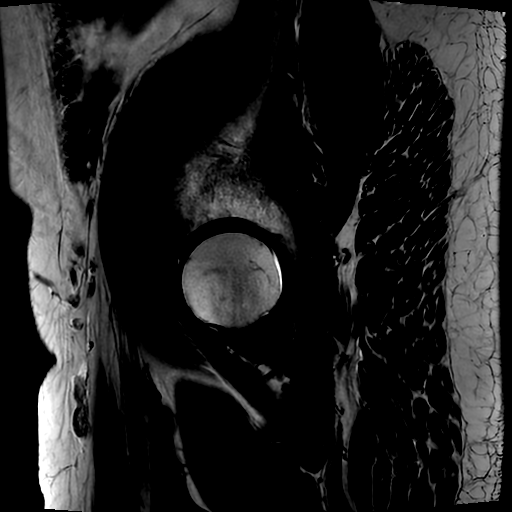
[im 13/39]
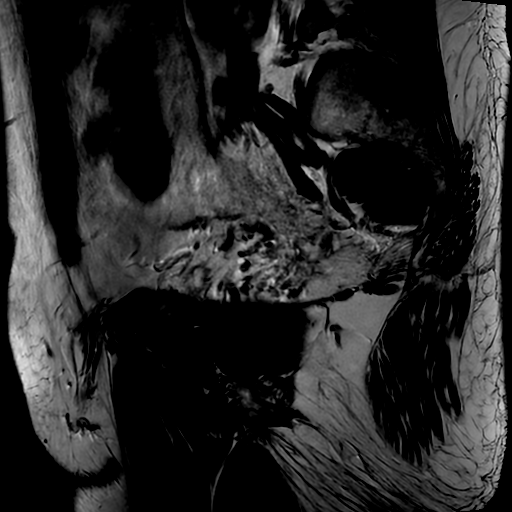
[im 26/39]
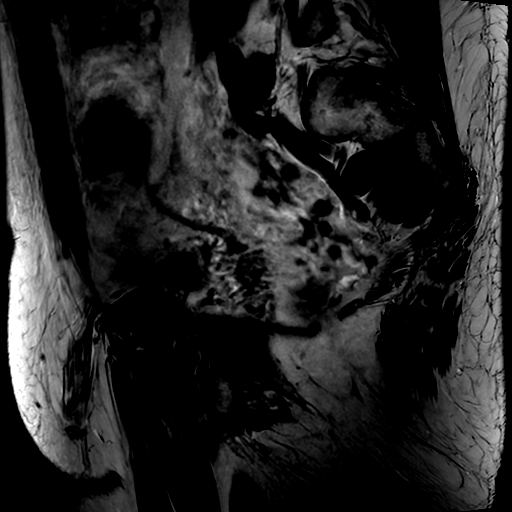
[im 39/39]
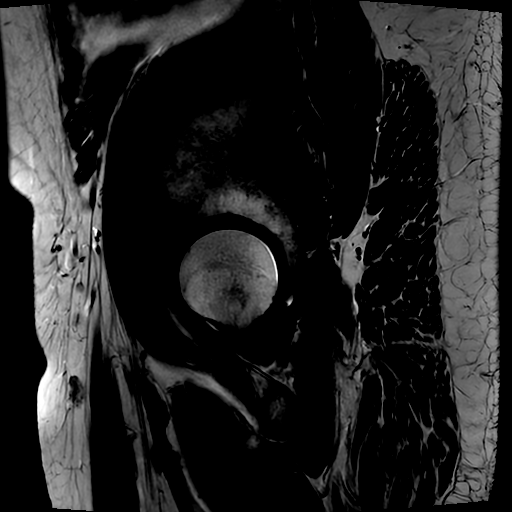

[Series 4: T2 · coronal · 4.0mm · 0.47mm/px · 5 of 43 slices shown (3 of 4)]
[im 1/43]
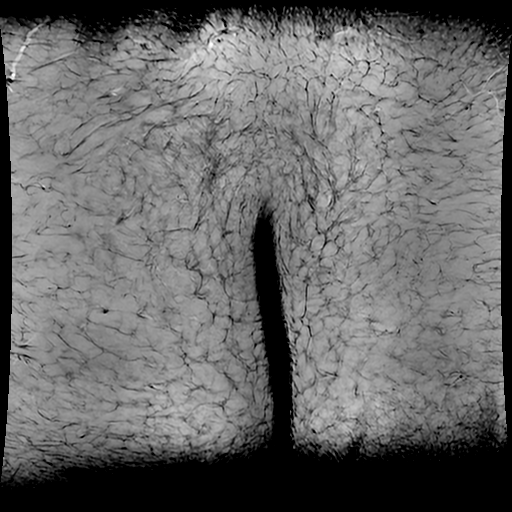
[im 11/43]
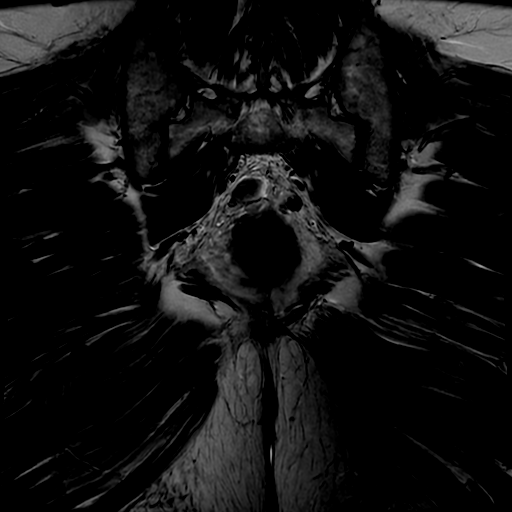
[im 22/43]
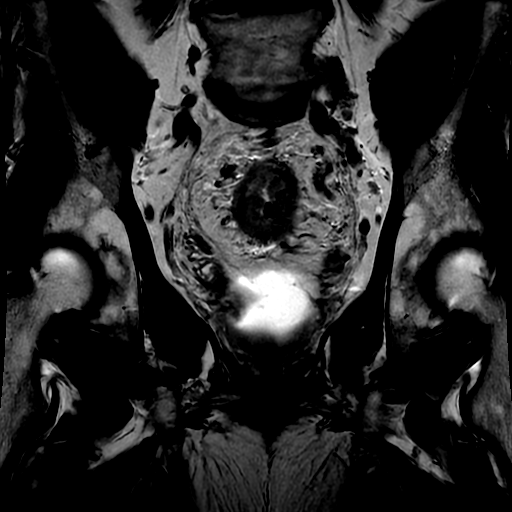
[im 32/43]
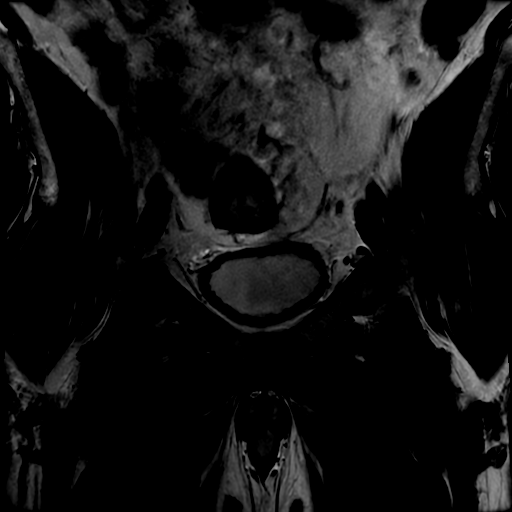
[im 43/43]
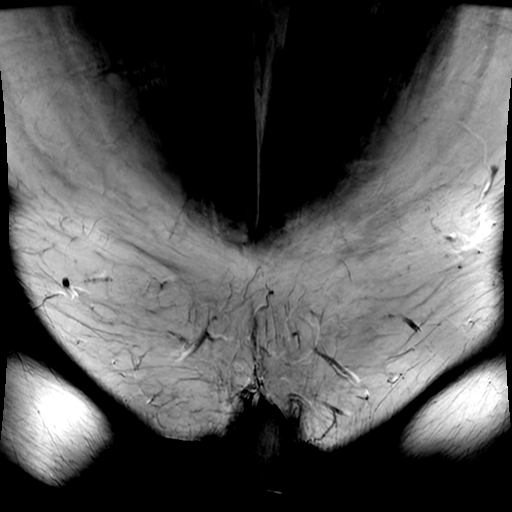

[Series 5: T2 · axial · 3.0mm · 0.47mm/px · z∈[+32,+204]mm · 5 of 45 slices shown (4 of 4)]
[im 1/45]
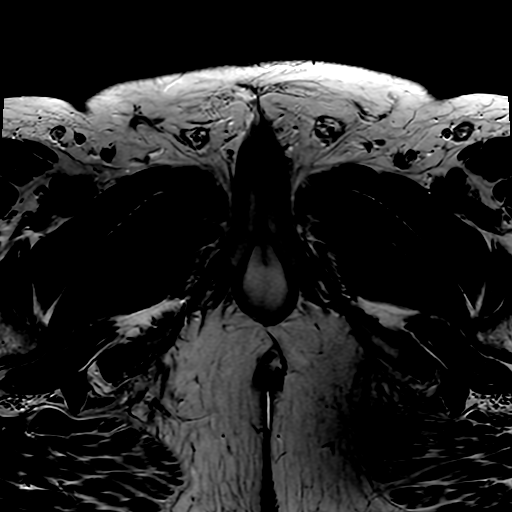
[im 12/45]
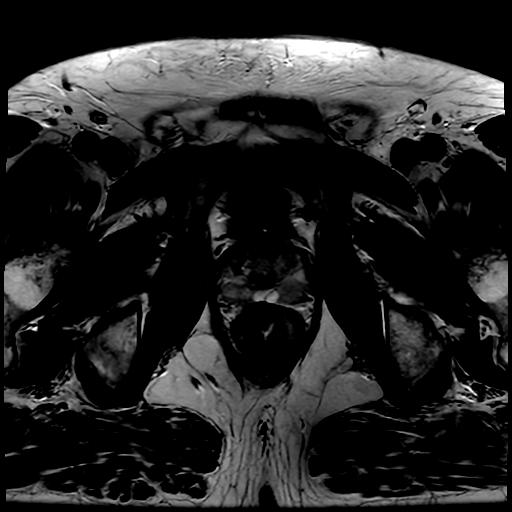
[im 23/45]
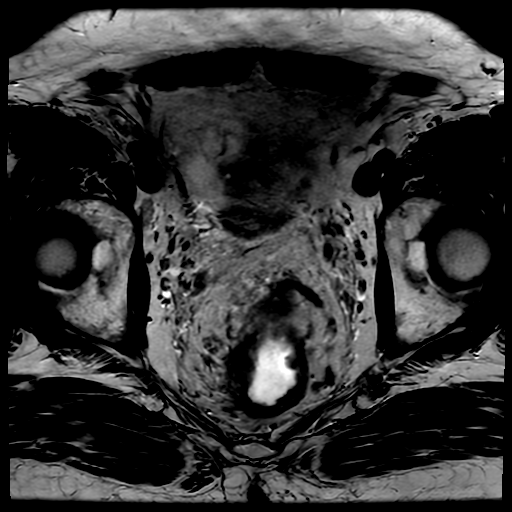
[im 34/45]
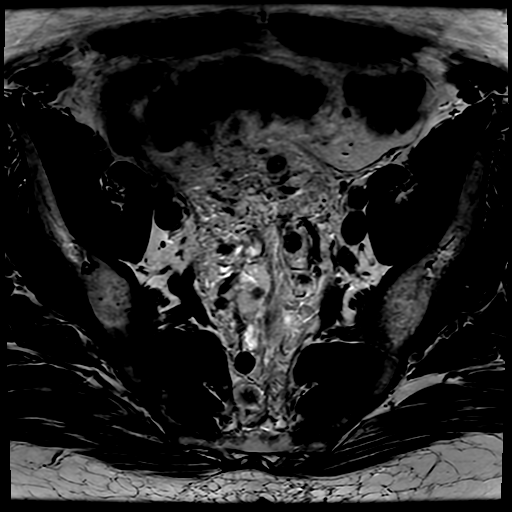
[im 45/45]
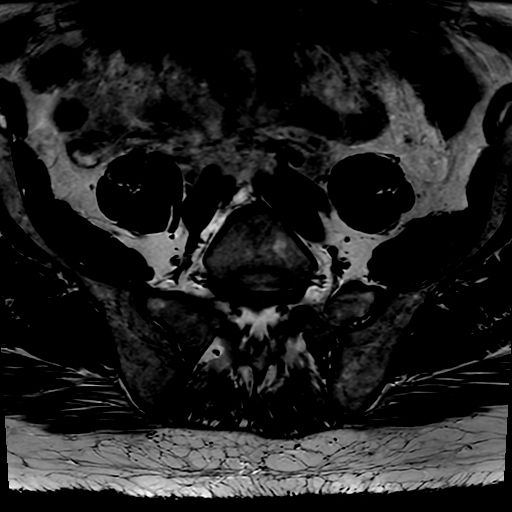

[19 of 48 positions shown; findings below may reference images not displayed]

FINDINGS: TUMOR LOCATION

Tumor distance from Anal Verge/Skin Surface:  10 cm

Tumor distance from Internal Anal Sphincter:  8 cm

TUMOR DESCRIPTION

Circumferential Extent: 360 degrees

Tumor Length: 8.3 cm

T - CATEGORY

Extension through Muscularis Propria: [N7] mm equals T3 C

Shortest Distance of any tumor/node from Mesorectal Fascia: 1 mm (8
mm node on image [DATE] abuts the mesorectal fascia)

Extramural Vascular Invasion/Tumor Thrombus: No

Invasion of Anterior Peritoneal Reflection: No

Involvement of Adjacent Organs or Pelvic Sidewall: No

Levator Ani Involvement: No

N - CATEGORY

Mesorectal Lymph Nodes >=5mm: Approximately ten metastatic nodes in
the mesorectal sheath. There are several large nodes in the
presacral space. For example 12 mm node on image [DATE] and 8 mm node
on same image. 8 mm node on image 34/5

Extra-mesorectal Lymphadenopathy: Not identified

Other:  None.
IMPRESSION: Rectal adenocarcinoma T stage: T3 C

Rectal adenocarcinoma N stage: N2

Distance from tumor to the internal anal sphincter is 8 cm.

## 2021-12-03 MED ORDER — GADOBUTROL 1 MMOL/ML IV SOLN
10.0000 mL | Freq: Once | INTRAVENOUS | Status: AC | PRN
  Administered 2021-12-03: 10 mL via INTRAVENOUS

## 2021-12-03 NOTE — Telephone Encounter (Signed)
Patient's daughter Edythe Clarity called and states her dad was just schedule to have an MRI of his pelvis on 12/02/21 and had to be canceled. The nurse asked if he had an enema and nothing was said to them about having a bowel prep so the MRI. I spoke with center schedule and got him schedule for today at 130 with [facility]. Center Schedule needed the order to change to stat, so she can get him into. I spoke with the provider about the order needs to be change. The provider was leave the order as routine.

## 2021-12-04 DIAGNOSIS — C20 Malignant neoplasm of rectum: Secondary | ICD-10-CM | POA: Diagnosis not present

## 2021-12-07 ENCOUNTER — Inpatient Hospital Stay (HOSPITAL_BASED_OUTPATIENT_CLINIC_OR_DEPARTMENT_OTHER): Payer: BC Managed Care – PPO | Admitting: Oncology

## 2021-12-07 ENCOUNTER — Encounter: Payer: Self-pay | Admitting: Medical Oncology

## 2021-12-07 VITALS — BP 125/78 | HR 77 | Temp 98.2°F | Resp 18 | Ht 73.0 in | Wt 255.0 lb

## 2021-12-07 DIAGNOSIS — R140 Abdominal distension (gaseous): Secondary | ICD-10-CM | POA: Diagnosis not present

## 2021-12-07 DIAGNOSIS — C20 Malignant neoplasm of rectum: Secondary | ICD-10-CM

## 2021-12-07 DIAGNOSIS — H532 Diplopia: Secondary | ICD-10-CM | POA: Diagnosis not present

## 2021-12-07 DIAGNOSIS — F329 Major depressive disorder, single episode, unspecified: Secondary | ICD-10-CM | POA: Diagnosis not present

## 2021-12-07 NOTE — Progress Notes (Signed)
Trial: [employer] 2021-05 - Specimen Collection Study to Evaluate Biomarkers in Subjects with Cancer    Patient Billy Roman was identified by Dr. Benay Spice as a potential candidate for the above listed study.  This Clinical Research Nurse met with TREAVOR BLOMQUIST, [id number], on 12/07/21 in a manner and location that ensures patient privacy to discuss participation in the above listed research study.  Patient is Accompanied by his two daughters this afternoon .  A copy of the informed consent document with embedded HIPAA language was provided to the patient.  Patient reads, speaks, and understands [place].    Patient was provided with the business card of this Nurse and encouraged to contact the research team with any questions.  Patient was provided the option of taking informed consent documents home to review and was encouraged to review at their convenience with their support network, including other care providers. Patient is comfortable with making a decision regarding study participation today.  As outlined in the informed consent form, this Nurse and [name] discussed the purpose of the research study, the investigational nature of the study, study procedures and requirements for study participation, potential risks and benefits of study participation, as well as alternatives to participation. This study is not blinded. The patient understands participation is voluntary and they may withdraw from study participation at any time.  This study does not involve randomization.  This study does not involve an investigational drug or device. This study does not involve a placebo. Patient understands enrollment is pending full eligibility review.   Confidentiality and how the patient's information will be used as part of study participation were discussed.  Patient was informed there is reimbursement provided for their time and effort spent on trial participation.  The patient is  encouraged to discuss research study participation with their insurance provider to determine what costs they may incur as part of study participation, including research related injury.    All questions were answered to patient's satisfaction.  The informed consent with embedded HIPAA language was reviewed page by page.  The patient's mental and emotional status is appropriate to provide informed consent, and the patient verbalizes an understanding of study participation.  Patient has agreed to participate in the above listed research study and has voluntarily signed the informed consent version Revised 16 Jul 2021  with embedded HIPAA language, version Revised 16 Jul 2021  on 12/07/21 at 3:15PM.  The patient was provided with a copy of the signed informed consent form with embedded HIPAA language for their reference.  No study specific procedures were obtained prior to the signing of the informed consent document.  Approximately 20 minutes were spent with the patient reviewing the informed consent documents.  Patient was not requested to complete a Release of Information form. Release of Information form will be presented for signing to the patient, prior to study specimen collection.   Patient was informed that I will check his future appointments to schedule for the blood draw. Patient and family were thanked for their time and interest in study and were encouraged to contact Dr. Benay Spice or myself with questions or concerns.   Maxwell Marion, RN, BSN, [place] Clinical Research Nurse Lead 12/07/2021 3:44 PM

## 2021-12-10 ENCOUNTER — Encounter: Payer: Self-pay | Admitting: *Deleted

## 2021-12-10 ENCOUNTER — Other Ambulatory Visit: Payer: Self-pay | Admitting: Medical Oncology

## 2021-12-10 ENCOUNTER — Other Ambulatory Visit (HOSPITAL_COMMUNITY): Payer: Self-pay | Admitting: Surgery

## 2021-12-10 ENCOUNTER — Other Ambulatory Visit: Payer: Self-pay | Admitting: Surgery

## 2021-12-10 ENCOUNTER — Encounter: Payer: Self-pay | Admitting: Family

## 2021-12-10 DIAGNOSIS — C20 Malignant neoplasm of rectum: Secondary | ICD-10-CM

## 2021-12-11 NOTE — Progress Notes (Signed)
Virtual Visit via Telephone Note  I connected with Billy Roman, on 12/13/2021 at 8:23 AM by telephone and verified that I am speaking with the correct person using two identifiers.  Consent: I discussed the limitations, risks, security and privacy concerns of performing an evaluation and management service by telephone and the availability of in person appointments. I also discussed with the patient that there may be a patient responsible charge related to this service. The patient expressed understanding and agreed to proceed.   Location of Patient: Home  Location of Provider: [facility] participating in Telemedicine visit: [place], NP Elmon Else, CMA   History of Present Illness: Billy Roman is a 61 year-old male who presents for smoking cessation. Currently smoking around 1 PPD. Recently diagnosed with rectal cancer with plans to begin chemotherapy within the next week. Reports told nicotine options for smoking cessation are not recommended as this may worsen cancer and/or complicate chemotherapy.     Past Medical History:  Diagnosis Date   Anxiety    Arrhythmia    [name], recurrent severe, without psychosis ([place]) 08/23/2021   Rectal cancer ([place]) 11/20/2021   Sleep apnea    Allergies  Allergen Reactions   Propyphenazone Anaphylaxis, Itching and Rash    Felt hot    Current Outpatient Medications on File Prior to Visit  Medication Sig Dispense Refill   desvenlafaxine (PRISTIQ) 50 MG 24 hr tablet Take 1 tablet (50 mg total) by mouth daily. (Patient not taking: Reported on 12/10/2021) 30 tablet 0   gabapentin (NEURONTIN) 300 MG capsule Take 100 mg by mouth 2 (two) times daily as needed (neuropathy).     VITAMIN D PO Take 1,000 Units by mouth once a week.     Vitamin D, Ergocalciferol, (DRISDOL) 1.25 MG (50000 UNIT) CAPS capsule Take 1 capsule (50,000 Units total) by  mouth every 7 (seven) days. (Patient not taking: Reported on 12/10/2021) 5 capsule 0   No current facility-administered medications on file prior to visit.    Observations/Objective: Alert and oriented x 3. Not in acute distress. Physical examination not completed as this is a telemedicine visit.  Assessment and Plan: 1. Encounter for smoking cessation counseling - Patient with history of anxiety, depression, suicidal ideation, and PTSD. Established with Psychiatry ([facility] [place] and [place]). Reports appointments are once monthly. - Discussed with patient any oral smoking cessation medications such as Chantix or Zyban may cause side effects similar to psychiatry diagnoses. - Counseled official medical agreement from Psychiatry needed prior to prescribing these medications. Patient verbalized understanding and plans to ask his psychiatrist for further advisement.  - Follow-up with primary care as scheduled.     Follow Up Instructions: Follow-up with primary provider as scheduled.   Patient was given clear instructions to go to Emergency Department or return to medical center if symptoms don't improve, worsen, or new problems develop.The patient verbalized understanding.  I discussed the assessment and treatment plan with the patient. The patient was provided an opportunity to ask questions and all were answered. The patient agreed with the plan and demonstrated an understanding of the instructions.   The patient was advised to call back or seek an in-person evaluation if the symptoms worsen or if the condition fails to improve as anticipated.     I provided 7 minutes total of non-face-to-face time during this encounter.   Tarynn Garling Zachery Dauer,  NP  [facility] [employer], [place] 12/13/2021, 8:23 AM

## 2021-12-12 ENCOUNTER — Inpatient Hospital Stay: Payer: BC Managed Care – PPO | Admitting: Nurse Practitioner

## 2021-12-12 ENCOUNTER — Other Ambulatory Visit: Payer: Self-pay

## 2021-12-12 NOTE — Progress Notes (Signed)
The proposed treatment discussed in conference is for discussion purpose only and is not a binding recommendation.  The patients have not been physically examined, or presented with their treatment options.  Therefore, final treatment plans cannot be decided.  

## 2021-12-13 ENCOUNTER — Encounter: Payer: Self-pay | Admitting: Oncology

## 2021-12-13 ENCOUNTER — Ambulatory Visit (INDEPENDENT_AMBULATORY_CARE_PROVIDER_SITE_OTHER): Payer: BC Managed Care – PPO | Admitting: Family

## 2021-12-13 ENCOUNTER — Telehealth: Payer: Self-pay | Admitting: Medical Oncology

## 2021-12-13 DIAGNOSIS — Z716 Tobacco abuse counseling: Secondary | ICD-10-CM

## 2021-12-13 DIAGNOSIS — F17210 Nicotine dependence, cigarettes, uncomplicated: Secondary | ICD-10-CM

## 2021-12-13 DIAGNOSIS — F331 Major depressive disorder, recurrent, moderate: Secondary | ICD-10-CM | POA: Diagnosis not present

## 2021-12-13 DIAGNOSIS — Z710 Person encountering health services to consult on behalf of another person: Secondary | ICD-10-CM

## 2021-12-13 DIAGNOSIS — F1994 Other psychoactive substance use, unspecified with psychoactive substance-induced mood disorder: Secondary | ICD-10-CM | POA: Diagnosis not present

## 2021-12-13 NOTE — Telephone Encounter (Signed)
[employer] 2021-05 - Specimen Collection Study to Evaluate Biomarkers in Subjects with Cancer    Outgoing call: [phone] Spoke with patient regarding his appointments tomorrow morning for lab and chemo education. Informed patient that during his lab appointment we will draw the study blood specimen collection at that time, along with collecting smoking and drinking history, if patient still would like to participate with study. Patient stated he would. Patient states he was adopted and does not know or have his family health/cancer history. All patient's questions answered. Patient thanked and encouraged to call with questions.   Maxwell Marion, RN, BSN, [place], [place] Clinical Research Nurse Lead 12/13/2021 2:41 PM

## 2021-12-14 ENCOUNTER — Encounter (HOSPITAL_COMMUNITY): Payer: Self-pay | Admitting: Surgery

## 2021-12-14 ENCOUNTER — Inpatient Hospital Stay: Payer: BC Managed Care – PPO

## 2021-12-14 ENCOUNTER — Other Ambulatory Visit: Payer: Self-pay | Admitting: *Deleted

## 2021-12-14 ENCOUNTER — Encounter: Payer: Self-pay | Admitting: Medical Oncology

## 2021-12-14 ENCOUNTER — Other Ambulatory Visit: Payer: Self-pay

## 2021-12-14 DIAGNOSIS — H532 Diplopia: Secondary | ICD-10-CM | POA: Diagnosis not present

## 2021-12-14 DIAGNOSIS — C20 Malignant neoplasm of rectum: Secondary | ICD-10-CM

## 2021-12-14 DIAGNOSIS — F329 Major depressive disorder, single episode, unspecified: Secondary | ICD-10-CM | POA: Diagnosis not present

## 2021-12-14 DIAGNOSIS — R140 Abdominal distension (gaseous): Secondary | ICD-10-CM | POA: Diagnosis not present

## 2021-12-14 LAB — CBC WITH DIFFERENTIAL (CANCER CENTER ONLY)
Abs Immature Granulocytes: 0.02 10*3/uL (ref 0.00–0.07)
Basophils Absolute: 0.1 10*3/uL (ref 0.0–0.1)
Basophils Relative: 1 %
Eosinophils Absolute: 0.2 10*3/uL (ref 0.0–0.5)
Eosinophils Relative: 3 %
HCT: 44.7 % (ref 39.0–52.0)
Hemoglobin: 14.4 g/dL (ref 13.0–17.0)
Immature Granulocytes: 0 %
Lymphocytes Relative: 26 %
Lymphs Abs: 1.6 10*3/uL (ref 0.7–4.0)
MCH: 28.8 pg (ref 26.0–34.0)
MCHC: 32.2 g/dL (ref 30.0–36.0)
MCV: 89.4 fL (ref 80.0–100.0)
Monocytes Absolute: 0.5 10*3/uL (ref 0.1–1.0)
Monocytes Relative: 8 %
Neutro Abs: 3.6 10*3/uL (ref 1.7–7.7)
Neutrophils Relative %: 62 %
Platelet Count: 272 10*3/uL (ref 150–400)
RBC: 5 MIL/uL (ref 4.22–5.81)
RDW: 14.3 % (ref 11.5–15.5)
WBC Count: 5.9 10*3/uL (ref 4.0–10.5)
nRBC: 0 % (ref 0.0–0.2)

## 2021-12-14 LAB — CMP (CANCER CENTER ONLY)
ALT: 18 U/L (ref 0–44)
AST: 23 U/L (ref 15–41)
Albumin: 4.2 g/dL (ref 3.5–5.0)
Alkaline Phosphatase: 64 U/L (ref 38–126)
Anion gap: 9 (ref 5–15)
BUN: 14 mg/dL (ref 6–20)
CO2: 26 mmol/L (ref 22–32)
Calcium: 9.6 mg/dL (ref 8.9–10.3)
Chloride: 106 mmol/L (ref 98–111)
Creatinine: 0.77 mg/dL (ref 0.61–1.24)
GFR, Estimated: >60 mL/min (ref >60)
Glucose, Bld: 95 mg/dL (ref 70–99)
Potassium: 4.1 mmol/L (ref 3.5–5.1)
Sodium: 141 mmol/L (ref 135–145)
Total Bilirubin: 0.4 mg/dL (ref 0.3–1.2)
Total Protein: 7.6 g/dL (ref 6.5–8.1)

## 2021-12-14 LAB — CEA (ACCESS): CEA (CHCC): 3.65 ng/mL (ref 0.00–5.00)

## 2021-12-14 MED ORDER — LIDOCAINE-PRILOCAINE 2.5-2.5 % EX CREA
1.0000 | TOPICAL_CREAM | CUTANEOUS | 0 refills | Status: DC | PRN
Start: 2021-12-14 — End: 2022-05-24

## 2021-12-14 MED ORDER — PROCHLORPERAZINE MALEATE 10 MG PO TABS: 10.0000 mg | ORAL_TABLET | Freq: Four times a day (QID) | ORAL | 0 refills | Status: DC | PRN

## 2021-12-14 MED ORDER — ONDANSETRON HCL 8 MG PO TABS: 8.0000 mg | ORAL_TABLET | Freq: Three times a day (TID) | ORAL | 0 refills | Status: DC | PRN

## 2021-12-14 NOTE — Progress Notes (Signed)
Mr, Stickels denies chest pain or shortness of breath.  Patient denies having any s/s of Covid in her household.  Patient denies any known exposure to Covid.   Mr. Featherston'  PCP is Durene Fruits, NP

## 2021-12-14 NOTE — Research (Signed)
[employer] 2021-05 - Specimen Collection Study to Evaluate Biomarkers in Subjects with Cancer   This Nurse has reviewed this patient's inclusion and exclusion criteria as a second review and confirms Billy Roman is eligible for study participation.  Patient may continue with enrollment.  Jeral Fruit, RN 12/14/21 1:45 PM

## 2021-12-14 NOTE — Progress Notes (Signed)
Disability paperwork returned to pt.

## 2021-12-14 NOTE — Progress Notes (Addendum)
Note created for required eligibility co-sign by Dr. Benay Spice.

## 2021-12-14 NOTE — Progress Notes (Signed)
[employer] 2021-05 - Specimen Collection Study to Evaluate Biomarkers in Subjects with Cancer     Eligibility: Eligibility criteria reviewed with patient. This Nurse has reviewed this patient's inclusion and exclusion criteria and confirmed patient is eligible for study participation. Eligibility confirmed by treating investigator, who also agrees that patient should proceed with enrollment. Patient will continue with enrollment. Blood Collection: Research blood obtained by Unice Bailey, via fresh venipuncture. Patient does not have port placement at this time. Patient tolerated well without any adverse events. Gift Card: $50 gift card given to patient for his participation in this study.    Medical History:  High Blood Pressure  No Coronary Artery Disease No Lupus    No Rheumatoid Arthritis  No Diabetes   No      Lynch Syndrome  No  Is the patient currently taking a magnesium supplement? No  Does the patient have a personal history of cancer (greater than 5 years ago)? No  Does the patient have a family history of cancer in 1st or 2nd degree relatives? Per patient unknown, he states he was adopted and does not know birth family history.   Does the patient have history of alcohol consumption? Yes   If yes, current or former? current Number of years? 41 Drinks per week? 2  Does the patient have history of cigarette, cigar, pipe, or chewing tobacco use?  Yes  If yes, current for former? current If yes, type (Cigarette, cigar, pipe, and/or chewing tobacco)? Cigarette, no cigar, pipe or chewing tobacco.   Number of years? 40 Packs/number/containers per day? 1 pack per day   Maxwell Marion, RN, BSN, [facility], [place] Clinical Research Nurse Lead 12/14/2021 12:14 PM

## 2021-12-15 ENCOUNTER — Other Ambulatory Visit: Payer: Self-pay | Admitting: Oncology

## 2021-12-17 ENCOUNTER — Ambulatory Visit (HOSPITAL_COMMUNITY): Payer: BC Managed Care – PPO | Admitting: Certified Registered"

## 2021-12-17 ENCOUNTER — Ambulatory Visit (HOSPITAL_COMMUNITY): Payer: BC Managed Care – PPO

## 2021-12-17 ENCOUNTER — Encounter (HOSPITAL_COMMUNITY): Payer: Self-pay | Admitting: Surgery

## 2021-12-17 ENCOUNTER — Other Ambulatory Visit: Payer: Self-pay

## 2021-12-17 ENCOUNTER — Ambulatory Visit (HOSPITAL_COMMUNITY)
Admission: RE | Admit: 2021-12-17 | Discharge: 2021-12-17 | Disposition: A | Discharge status: Home / Self Care | Payer: BC Managed Care – PPO | Attending: Surgery | Admitting: Surgery

## 2021-12-17 ENCOUNTER — Encounter (HOSPITAL_COMMUNITY): Payer: Self-pay

## 2021-12-17 ENCOUNTER — Encounter (HOSPITAL_COMMUNITY)
Admission: RE | Disposition: A | Discharge status: Home / Self Care | Payer: Self-pay | Source: Home / Self Care | Attending: Surgery

## 2021-12-17 ENCOUNTER — Inpatient Hospital Stay (HOSPITAL_COMMUNITY): Admission: RE | Admit: 2021-12-17 | Payer: BC Managed Care – PPO | Source: Ambulatory Visit

## 2021-12-17 DIAGNOSIS — K5730 Diverticulosis of large intestine without perforation or abscess without bleeding: Secondary | ICD-10-CM | POA: Diagnosis not present

## 2021-12-17 DIAGNOSIS — R251 Tremor, unspecified: Secondary | ICD-10-CM | POA: Diagnosis not present

## 2021-12-17 DIAGNOSIS — F418 Other specified anxiety disorders: Secondary | ICD-10-CM | POA: Diagnosis not present

## 2021-12-17 DIAGNOSIS — C19 Malignant neoplasm of rectosigmoid junction: Secondary | ICD-10-CM | POA: Diagnosis not present

## 2021-12-17 DIAGNOSIS — F17200 Nicotine dependence, unspecified, uncomplicated: Secondary | ICD-10-CM | POA: Diagnosis not present

## 2021-12-17 DIAGNOSIS — K648 Other hemorrhoids: Secondary | ICD-10-CM | POA: Insufficient documentation

## 2021-12-17 DIAGNOSIS — I4510 Unspecified right bundle-branch block: Secondary | ICD-10-CM | POA: Diagnosis not present

## 2021-12-17 DIAGNOSIS — Z452 Encounter for adjustment and management of vascular access device: Secondary | ICD-10-CM | POA: Diagnosis not present

## 2021-12-17 DIAGNOSIS — Z5111 Encounter for antineoplastic chemotherapy: Secondary | ICD-10-CM | POA: Diagnosis not present

## 2021-12-17 DIAGNOSIS — C20 Malignant neoplasm of rectum: Secondary | ICD-10-CM | POA: Diagnosis not present

## 2021-12-17 DIAGNOSIS — E669 Obesity, unspecified: Secondary | ICD-10-CM | POA: Diagnosis not present

## 2021-12-17 HISTORY — PX: PORTACATH PLACEMENT: SHX2246

## 2021-12-17 HISTORY — DX: Pneumonia, unspecified organism: J18.9

## 2021-12-17 HISTORY — DX: Other complications of anesthesia, initial encounter: T88.59XA

## 2021-12-17 HISTORY — PX: OPERATIVE ULTRASOUND: SHX5996

## 2021-12-17 SURGERY — INSERTION, TUNNELED CENTRAL VENOUS DEVICE, WITH PORT
Anesthesia: General | Site: Chest | Laterality: Right

## 2021-12-17 MED ORDER — MIDAZOLAM HCL 2 MG/2ML IJ SOLN
INTRAMUSCULAR | Status: AC
  Filled 2021-12-17: qty 2

## 2021-12-17 MED ORDER — ORAL CARE MOUTH RINSE: 15.0000 mL | Freq: Once | OROMUCOSAL | Status: AC

## 2021-12-17 MED ORDER — BUPIVACAINE-EPINEPHRINE (PF) 0.25% -1:200000 IJ SOLN
INTRAMUSCULAR | Status: DC | PRN
  Administered 2021-12-17: 10 mL

## 2021-12-17 MED ORDER — LIDOCAINE 2% (20 MG/ML) 5 ML SYRINGE
INTRAMUSCULAR | Status: DC | PRN
  Administered 2021-12-17: 60 mg via INTRAVENOUS

## 2021-12-17 MED ORDER — OXYCODONE HCL 5 MG PO TABS: 5.0000 mg | ORAL_TABLET | Freq: Once | ORAL | Status: DC | PRN

## 2021-12-17 MED ORDER — HEPARIN 6000 UNIT IRRIGATION SOLUTION
Status: AC
Start: 2021-12-17 — End: ?
  Filled 2021-12-17: qty 500

## 2021-12-17 MED ORDER — FENTANYL CITRATE (PF) 250 MCG/5ML IJ SOLN
INTRAMUSCULAR | Status: DC | PRN
Start: 2021-12-17 — End: 2021-12-17
  Administered 2021-12-17 (×2): 50 ug via INTRAVENOUS

## 2021-12-17 MED ORDER — LACTATED RINGERS IV SOLN: INTRAVENOUS | Status: DC

## 2021-12-17 MED ORDER — AMISULPRIDE (ANTIEMETIC) 5 MG/2ML IV SOLN: 10.0000 mg | Freq: Once | INTRAVENOUS | Status: DC | PRN

## 2021-12-17 MED ORDER — PROPOFOL 10 MG/ML IV BOLUS
INTRAVENOUS | Status: AC
  Filled 2021-12-17: qty 20

## 2021-12-17 MED ORDER — CEFAZOLIN SODIUM-DEXTROSE 2-3 GM-%(50ML) IV SOLR
INTRAVENOUS | Status: DC | PRN
  Administered 2021-12-17: 2 g via INTRAVENOUS

## 2021-12-17 MED ORDER — PROPOFOL 10 MG/ML IV BOLUS
INTRAVENOUS | Status: DC | PRN
  Administered 2021-12-17: 200 mg via INTRAVENOUS

## 2021-12-17 MED ORDER — IOHEXOL 300 MG/ML  SOLN
INTRAMUSCULAR | Status: DC | PRN
  Administered 2021-12-17: 8 mL

## 2021-12-17 MED ORDER — DEXAMETHASONE SODIUM PHOSPHATE 10 MG/ML IJ SOLN
INTRAMUSCULAR | Status: DC | PRN
  Administered 2021-12-17: 4 mg via INTRAVENOUS

## 2021-12-17 MED ORDER — HEPARIN 6000 UNIT IRRIGATION SOLUTION
Status: DC | PRN
  Administered 2021-12-17: 1

## 2021-12-17 MED ORDER — OXYCODONE HCL 5 MG/5ML PO SOLN: 5.0000 mg | Freq: Once | ORAL | Status: DC | PRN

## 2021-12-17 MED ORDER — HEPARIN SOD (PORK) LOCK FLUSH 100 UNIT/ML IV SOLN
INTRAVENOUS | Status: AC
  Filled 2021-12-17: qty 5

## 2021-12-17 MED ORDER — DEXAMETHASONE SODIUM PHOSPHATE 10 MG/ML IJ SOLN
INTRAMUSCULAR | Status: AC
  Filled 2021-12-17: qty 1

## 2021-12-17 MED ORDER — ONDANSETRON HCL 4 MG/2ML IJ SOLN
INTRAMUSCULAR | Status: AC
  Filled 2021-12-17: qty 2

## 2021-12-17 MED ORDER — FENTANYL CITRATE (PF) 250 MCG/5ML IJ SOLN
INTRAMUSCULAR | Status: AC
  Filled 2021-12-17: qty 5

## 2021-12-17 MED ORDER — ONDANSETRON HCL 4 MG/2ML IJ SOLN
INTRAMUSCULAR | Status: DC | PRN
  Administered 2021-12-17: 4 mg via INTRAVENOUS

## 2021-12-17 MED ORDER — MIDAZOLAM HCL 2 MG/2ML IJ SOLN
INTRAMUSCULAR | Status: DC | PRN
  Administered 2021-12-17: 2 mg via INTRAVENOUS

## 2021-12-17 MED ORDER — BUPIVACAINE-EPINEPHRINE (PF) 0.25% -1:200000 IJ SOLN
INTRAMUSCULAR | Status: AC
  Filled 2021-12-17: qty 30

## 2021-12-17 MED ORDER — ONDANSETRON HCL 4 MG/2ML IJ SOLN: 4.0000 mg | Freq: Once | INTRAMUSCULAR | Status: DC | PRN

## 2021-12-17 MED ORDER — CEFAZOLIN SODIUM 1 G IJ SOLR
INTRAMUSCULAR | Status: AC
  Filled 2021-12-17: qty 20

## 2021-12-17 MED ORDER — CHLORHEXIDINE GLUCONATE 0.12 % MT SOLN
15.0000 mL | Freq: Once | OROMUCOSAL | Status: AC
  Administered 2021-12-17: 15 mL via OROMUCOSAL
  Filled 2021-12-17: qty 15

## 2021-12-17 MED ORDER — FENTANYL CITRATE (PF) 100 MCG/2ML IJ SOLN: 25.0000 ug | INTRAMUSCULAR | Status: DC | PRN

## 2021-12-17 MED ORDER — 0.9 % SODIUM CHLORIDE (POUR BTL) OPTIME
TOPICAL | Status: DC | PRN
  Administered 2021-12-17: 1000 mL

## 2021-12-17 MED ORDER — TRAMADOL HCL 50 MG PO TABS: 50.0000 mg | ORAL_TABLET | Freq: Four times a day (QID) | ORAL | 0 refills | Status: AC | PRN

## 2021-12-17 MED ORDER — HEPARIN SOD (PORK) LOCK FLUSH 100 UNIT/ML IV SOLN
INTRAVENOUS | Status: DC | PRN
  Administered 2021-12-17: 300 [IU]

## 2021-12-17 SURGICAL SUPPLY — 44 items
ADH SKN CLS APL DERMABOND .7 (GAUZE/BANDAGES/DRESSINGS) ×1
BAG COUNTER SPONGE SURGICOUNT (BAG) ×3 IMPLANT
BAG DECANTER FOR FLEXI CONT (MISCELLANEOUS) ×3 IMPLANT
BAG SPNG CNTER NS LX DISP (BAG) ×1
CHLORAPREP W/TINT 10.5 ML (MISCELLANEOUS) ×3 IMPLANT
COVER SURGICAL LIGHT HANDLE (MISCELLANEOUS) ×3 IMPLANT
COVER TRANSDUCER ULTRASND GEL (DISPOSABLE) ×3 IMPLANT
DERMABOND ADVANCED (GAUZE/BANDAGES/DRESSINGS) ×1
DERMABOND ADVANCED .7 DNX12 (GAUZE/BANDAGES/DRESSINGS) ×2 IMPLANT
DRAPE C-ARM 42X120 X-RAY (DRAPES) ×3 IMPLANT
DRAPE CHEST BREAST 15X10 FENES (DRAPES) ×3 IMPLANT
ELECT CAUTERY BLADE 6.4 (BLADE) ×3 IMPLANT
ELECT REM PT RETURN 9FT ADLT (ELECTROSURGICAL) ×2
ELECTRODE REM PT RTRN 9FT ADLT (ELECTROSURGICAL) ×2 IMPLANT
GAUZE 4X4 16PLY ~~LOC~~+RFID DBL (SPONGE) ×3 IMPLANT
GEL ULTRASOUND 20GR AQUASONIC (MISCELLANEOUS) ×3 IMPLANT
GLOVE BIO SURGEON STRL SZ7.5 (GLOVE) ×3 IMPLANT
GLOVE INDICATOR 8.0 STRL GRN (GLOVE) ×3 IMPLANT
GOWN STRL REUS W/ TWL LRG LVL3 (GOWN DISPOSABLE) ×2 IMPLANT
GOWN STRL REUS W/ TWL XL LVL3 (GOWN DISPOSABLE) ×2 IMPLANT
GOWN STRL REUS W/TWL LRG LVL3 (GOWN DISPOSABLE) ×2
GOWN STRL REUS W/TWL XL LVL3 (GOWN DISPOSABLE) ×2
INTRODUCER COOK 11FR (CATHETERS) IMPLANT
KIT BASIN OR (CUSTOM PROCEDURE TRAY) ×3 IMPLANT
KIT PORT POWER 8FR ISP CVUE (Port) ×3 IMPLANT
KIT TURNOVER KIT B (KITS) ×3 IMPLANT
NS IRRIG 1000ML POUR BTL (IV SOLUTION) ×3 IMPLANT
PAD ARMBOARD 7.5X6 YLW CONV (MISCELLANEOUS) ×6 IMPLANT
PENCIL BUTTON HOLSTER BLD 10FT (ELECTRODE) ×3 IMPLANT
POSITIONER HEAD DONUT 9IN (MISCELLANEOUS) ×3 IMPLANT
SET INTRODUCER 12FR PACEMAKER (INTRODUCER) IMPLANT
SET SHEATH INTRODUCER 10FR (MISCELLANEOUS) IMPLANT
SHEATH COOK PEEL AWAY SET 9F (SHEATH) IMPLANT
SPIKE FLUID TRANSFER (MISCELLANEOUS) ×3 IMPLANT
SUT MNCRL AB 4-0 PS2 18 (SUTURE) ×3 IMPLANT
SUT PROLENE 2 0 SH DA (SUTURE) ×3 IMPLANT
SUT SILK 2 0 (SUTURE)
SUT SILK 2-0 18XBRD TIE 12 (SUTURE) IMPLANT
SUT VIC AB 3-0 SH 27 (SUTURE) ×2
SUT VIC AB 3-0 SH 27XBRD (SUTURE) ×2 IMPLANT
SYR 5ML LUER SLIP (SYRINGE) ×3 IMPLANT
TOWEL GREEN STERILE (TOWEL DISPOSABLE) ×3 IMPLANT
TOWEL GREEN STERILE FF (TOWEL DISPOSABLE) ×3 IMPLANT
TRAY LAPAROSCOPIC MC (CUSTOM PROCEDURE TRAY) ×3 IMPLANT

## 2021-12-17 NOTE — Transfer of Care (Signed)
Immediate Anesthesia Transfer of Care Note  Patient: Billy Roman  Procedure(s) Performed: INSERTION PORT-A-CATH (Right: Chest) OPERATIVE ULTRASOUND (Right: Chest)  Patient Location: PACU  Anesthesia Type:General  Level of Consciousness: awake, alert  and oriented  Airway & Oxygen Therapy: Patient Spontanous Breathing and Patient connected to nasal cannula oxygen  Post-op Assessment: Report given to RN and Post -op Vital signs reviewed and stable  Post vital signs: Reviewed and stable  Last Vitals:  Vitals Value Taken Time  BP 1287/83   Temp    Pulse 74   Resp 16   SpO2 95     Last Pain:  Vitals:   12/17/21 0954  TempSrc:   PainSc: 0-No pain         Complications: No notable events documented.

## 2021-12-17 NOTE — H&P (Signed)
CC: Here today for surgery - port-a-cath placement  HPI: Billy Roman is an 61 y.o. male with history of tobacco use, recent tremors (follows with neurology), whom is seen in the office today as a referral by Dr. Carlean Purl for evaluation of rectal cancer.  He underwent colonoscopy 11/19/21 -  - Preparation of the colon was fair. - Malignant partially obstructing tumor in the rectum. Biopsied. 12-19 cm estimated location - not palpable w/ finger. TATTOO x 2 distal to tumor placed in rectum w/ SPOT - Two 5 mm polyps in the sigmoid colon and in the ascending colon, removed with a cold snare. Resected and retrieved. - Diverticulosis in the sigmoid colon. - External and internal hemorrhoids. - The examination was otherwise normal on direct and retroflexion views.  PATH:  1. Sigmoid Colon Polyp, x 1 and ascending x 1 FINDINGS CONSISTENT WITH TUBULAR ADENOMA. NO HIGH-GRADE DYSPLASIA OR MALIGNANCY IS SEEN. 2. Rectum, biopsy, mass :MODERATELY TO POORLY DIFFERENTIATED ADENOCARCINOMA.  CEA 11/19/21 - 2.9 CT CAP 11/30/21 1. Extensive mural thickening in the distal sigmoid colon and proximal rectum suggesting infiltrative colorectal neoplasm with haziness of the mesorectal fat and adjacent mesorectal lymphadenopathy concerning for local direct invasion and nodal disease. 2. Small pulmonary nodules measuring 5 mm or less in the lungs, nonspecific. Metastatic disease is not excluded, but not favored on the basis of today's examination. Attention on follow-up studies is recommended to ensure stability. 3. No definite metastatic disease to the abdomen. 4. Aortic atherosclerosis, in addition to three-vessel coronary artery disease. Please note that although the presence of coronary artery calcium documents the presence of coronary artery disease, the severity of this disease and any potential stenosis cannot be assessed on this non-gated CT examination. Assessment for potential risk factor  modification, dietary therapy or pharmacologic therapy may be warranted, if clinically indicated. 5. Additional incidental findings, as above.   MRI Pelvis 12/03/21 - Rectal adenocarcinoma T stage: T3 C   Rectal adenocarcinoma N stage: N2   Distance from tumor to the internal anal sphincter is 8 cm. Case presented at our multidisciplinary tumor board and consensus recommendation was for TNT. We have been asked by Dr. Benay Spice to place port-a-cath for planned chemotherapy administration.   PMH: tobacco use, tremors  PSH: He denies any prior abdominal or pelvic surgical history.  FHx: Denies any known family history of colorectal, breast, endometrial or ovarian cancer  Social Hx: Smokes approximately 1 pack/day for 40 years. Social EtOH use. He has been seen in the office with his daughter whom had surgery with Dr. Kieth Brightly.  Past Medical History:  Diagnosis Date   Anxiety    Arrhythmia    Complication of anesthesia    Nausea   [name], recurrent severe, without psychosis ([place]) 08/23/2021   Pneumonia    Rectal cancer ([place]) 11/20/2021   Sleep apnea     Past Surgical History:  Procedure Laterality Date   RHINOPLASTY     SEPTOPLASTY     SHOULDER ARTHROSCOPY Left    SHOULDER ARTHROSCOPY WITH OPEN ROTATOR CUFF REPAIR AND DISTAL CLAVICLE ACROMINECTOMY Right 02/01/2021   Procedure: RIGHT SHOULDER ARTHROSCOPY WITH MINI OPEN ROTATOR CUFF REPAIR AND DISTAL CLAVICLE EXCISION;  Surgeon: Thornton Park, MD;  Location: [facility] ORS;  Service: Orthopedics;  Laterality: Right;   SPINE SURGERY     lumbar laminectomy   WRIST SURGERY Bilateral    corpal tunnel    Family History  Adopted: Yes  Problem Relation Age of Onset  Colon cancer Neg Hx    Stomach cancer Neg Hx    Esophageal cancer Neg Hx     Social:  reports that he has been smoking cigarettes. He has a 30.00 pack-year smoking history. He has never been exposed to tobacco smoke. He quit smokeless tobacco  use about 33 years ago.  His smokeless tobacco use included chew. He reports current alcohol use of about 2.0 standard drinks of alcohol per week. He reports current drug use. Frequency: 1.00 time per week. Drug: Marijuana.  Allergies:  Allergies  Allergen Reactions   Propyphenazone Anaphylaxis, Itching and Rash    Felt hot    Medications: I have reviewed the patient's current medications.  No results found for this or any previous visit (from the past 48 hour(s)).  No results found.  ROS - all of the below systems have been reviewed with the patient and positives are indicated with bold text General: chills, fever or night sweats Eyes: blurry vision or double vision ENT: epistaxis or sore throat Allergy/Immunology: itchy/watery eyes or nasal congestion Hematologic/Lymphatic: bleeding problems, blood clots or swollen lymph nodes Endocrine: temperature intolerance or unexpected weight changes Breast: new or changing breast lumps or nipple discharge Resp: cough, shortness of breath, or wheezing CV: chest pain or dyspnea on exertion GI: as per HPI GU: dysuria, trouble voiding, or hematuria MSK: joint pain or joint stiffness Neuro: TIA or stroke symptoms Derm: pruritus and skin lesion changes Psych: anxiety and depression  PE Blood pressure 110/71, pulse 66, temperature 98.5 F (36.9 C), temperature source Oral, resp. rate 18, height '6\' 1"'$  (1.854 m), weight 115.7 kg, SpO2 95 %. Constitutional: NAD; conversant Eyes: Moist conjunctiva; no lid lag; anicteric Lungs: Normal respiratory effort CV: RRR MSK: Normal range of motion of extremities Psychiatric: Appropriate affect; alert and oriented x3 Lymphatic: No palpable cervical lymphadenopathy  No results found for this or any previous visit (from the past 48 hour(s)).  No results found.  A/P: Billy Roman is an 61 y.o. male with clinical stage III proximal/mid rectal cancer - planning total neoadjuvant therapy (TNT) -  here today for port-a-catheter placement  -The anatomy of the neck and central vascular system was discussed with him today. We spent time discussing rational for port-a-cath placement, technical aspects of this procedure - planned right vs left internal jugular vein cannulation, scenarios where subclavian vein access may be necessary. -The planned procedures, material risks (including, but not limited to, pain, bleeding, infection, scarring, need for blood transfusion, damage to surrounding structures- arteries and veins/nerves/viscus/organs, blood clot, pneumothorax, hemothorax, need for additional procedures, port site infection, pneumonia, heart attack, stroke, death) benefits and alternatives to surgery were discussed. The patient's questions were answered to his satisfaction, he voiced understanding and elected to proceed with surgery. Additionally, we discussed typical postoperative expectations and the recovery process.  Nadeen Landau, [facility], [place]

## 2021-12-17 NOTE — Op Note (Signed)
12/17/2021  12:16 PM  PATIENT:  Billy Roman  61 y.o. male  Patient Care Team: Camillia Herter, NP as PCP - General (Nurse Practitioner) Lin Givens, RN as Oncology Nurse Navigator  PRE-OPERATIVE DIAGNOSIS:  Colorectal cancer, need for adjuvant chemotherapy  POST-OPERATIVE DIAGNOSIS: Same   PROCEDURE:  Placement of port-a-cath - right internal jugular vein Fluoroscopy Ultrasound guided access of right internal jugular vein  SURGEON:  Sharon Mt. Anahis Furgeson, MD  ASSISTANT: OR staff  ANESTHESIA:   local and general  COUNTS:  Sponge, needle and instrument counts were reported correct x2 at the conclusion of the operation.  EBL: 5 mL  DRAINS: None  SPECIMEN: None  COMPLICATIONS: None  FINDINGS: Standard right internal jugular venous anatomy on ultrasound evaluation of the right neck.  Right IJ is accessed on the first attempt.  The Port-A-Cath is able to be placed using the Seldinger technique with fluoroscopic guidance.  At conclusion, the tip of this rests at the cavoatrial junction by fluoroscopy.  Postprocedure chest x-ray demonstrates normal.  Exam without pneumo/hemothorax and appropriately positioned Port-A-Cath.  DISPOSITION: PACU in satisfactory condition  DESCRIPTION: The patient was identified in preop holding and taken to the OR where he was placed on the operating room table. SCDs were placed. General endotracheal anesthesia was induced without difficulty.  We began with a right neck ultrasound, surveying the venous anatomy.  This overlies the carotid artery on the right.  It is well distended.  He was then prepped and draped in the usual sterile fashion. A surgical timeout was performed indicating the correct patient, procedure, positioning and need for preoperative antibiotics.   Sterile ultrasound probe was utilized to again visualize the right neck.  The right internal jugular vein is identified.  Local anesthetic is infiltrated.  A shallow stab  incision is created overlying the internal jugular vein going through the skin and dermis only.  The introducer needle was then introduced into the right internal jugular vein under direct visualization on the first attempt.  This is directed caudad.  Using a Seldinger technique, a wire was advanced.  The ultrasound was then used to confirm the wire within the lumen of the internal jugular vein down to the level of the right clavicle.  Fluoroscopy demonstrates the wire to be within the right hemithorax and appropriate configuration.  A peel-away sheath is then introduced over the wire under fluoroscopic guidance.  The wire and introducer sheath were removed.  Port tubing is brought onto the field after flushing.  This is then introduced into the peel-away sheath.  This sheath is then cracked and peeled away.  The end of the tubing is clamped.  Attention was directed at creating a pocket for the port.  This is overlying the right chest.  The skin is incised.  Subcutaneous tissue divided electrocautery.  A pocket is created using electrocautery.  Hemostasis is verified within the pocket.  Pectoralis fascia is seen.  Stay sutures of 2-0 Prolene are placed through the port and into the fascia.  A tunneling device is brought to the field.  This is then tunneled to the stab incision in the right neck.  The tubing is then brought through the tunnel.  Approximately 2 cc of contrast is instilled within the tubing to aid in visualization.  The tubing is then slowly withdrawn through our pocket incision until the tip of the tubing rests at the cavoatrial junction.  The tubing is then trimmed to size.  The locking clamp for  the port is placed over the tubing and this is then mated with the port.  This is secured in place.  The port is placed back in the pocket and the Prolene sutures tied.  The port pocket is then closed in layers using 3-0 Vicryl deep dermal sutures followed by a 4-0 Monocryl subcuticular stitch.  All  counts are reported correct. The wound was washed, dried, and Dermabond is applied to both this and the neck incision.  The port is accessed using a Huber needle and we are able to confirm that it both aspirates and flushes freely.  The port is then "locked" with heparinized saline.  He is then awakened from anesthesia, extubated, and transferred to a stretcher for transport to PACU in satisfactory condition.  A postprocedure chest x-ray has been ordered.

## 2021-12-17 NOTE — Anesthesia Postprocedure Evaluation (Signed)
Anesthesia Post Note  Patient: Billy Roman  Procedure(s) Performed: INSERTION PORT-A-CATH (Right: Chest) OPERATIVE ULTRASOUND (Right: Chest)     Patient location during evaluation: PACU Anesthesia Type: General Level of consciousness: awake and alert and oriented Pain management: pain level controlled Vital Signs Assessment: post-procedure vital signs reviewed and stable Respiratory status: spontaneous breathing, nonlabored ventilation and respiratory function stable Cardiovascular status: blood pressure returned to baseline and stable Postop Assessment: no apparent nausea or vomiting Anesthetic complications: no   No notable events documented.  Last Vitals:  Vitals:   12/17/21 1234 12/17/21 1300  BP: 113/75 102/72  Pulse: 67 68  Resp: 14 12  Temp:  36.4 C  SpO2: 94% 92%    Last Pain:  Vitals:   12/17/21 1300  TempSrc:   PainSc: 6                  Darriel Utter A.

## 2021-12-17 NOTE — Anesthesia Procedure Notes (Signed)
Procedure Name: LMA Insertion Date/Time: 12/17/2021 11:12 AM  Performed by: Anastasio Auerbach, CRNAPre-anesthesia Checklist: Patient identified, Emergency Drugs available, Suction available and Patient being monitored Patient Re-evaluated:Patient Re-evaluated prior to induction Oxygen Delivery Method: Circle system utilized Preoxygenation: Pre-oxygenation with 100% oxygen Induction Type: IV induction Ventilation: Mask ventilation without difficulty LMA: LMA inserted LMA Size: 5.0 Number of attempts: 1 Tube secured with: Tape Dental Injury: Teeth and Oropharynx as per pre-operative assessment

## 2021-12-17 NOTE — Discharge Instructions (Addendum)
POST OP INSTRUCTIONS  DIET: As tolerated. Follow a light bland diet the first 24 hours after arrival home, such as soup, liquids, crackers, etc.  Be sure to include lots of fluids daily.  Avoid fast food or heavy meals as your are more likely to get nauseated.  Eat a low fat the next few days after surgery.  Take your usually prescribed home medications unless otherwise directed.  PAIN CONTROL: Pain is best controlled by a usual combination of three different methods TOGETHER: Ice/Heat Over the counter pain medication Prescription pain medication Most patients will experience some swelling and bruising around the surgical site.  Ice packs or heating pads (30-60 minutes up to 6 times a day) will help. Some people prefer to use ice alone, heat alone, alternating between ice & heat.  Experiment to what works for you.  Swelling and bruising can take several weeks to resolve.   It is helpful to take an over-the-counter pain medication regularly for the first few weeks: Ibuprofen (Motrin/Advil) - 200mg tabs - take 3 tabs (600mg) every 6 hours as needed for pain Acetaminophen (Tylenol) - you may take 650mg every 6 hours as needed. You can take this with motrin as they act differently on the body. If you are taking a narcotic pain medication that has acetaminophen in it, do not take over the counter tylenol at the same time.  Iii. NOTE: You may take both of these medications together - most patients  find it most helpful when alternating between the two (i.e. Ibuprofen at 6am,  tylenol at 9am, ibuprofen at 12pm ...) A  prescription for pain medication should be given to you upon discharge.  Take your pain medication as prescribed if your pain is not adequatly controlled with the over-the-counter pain reliefs mentioned above.  Avoid getting constipated.  Between the surgery and the pain medications, it is common to experience some constipation.  Increasing fluid intake and taking a fiber supplement (such as  Metamucil, Citrucel, FiberCon, MiraLax, etc) 1-2 times a day regularly will usually help prevent this problem from occurring.  A mild laxative (prune juice, Milk of Magnesia, MiraLax, etc) should be taken according to package directions if there are no bowel movements after 48 hours.    Dressing: Your incisions are covered in Dermabond which is like sterile superglue for the skin. This will come off on it's own in a couple weeks. It is waterproof and you may bathe normally starting the day after your surgery in a shower. Avoid baths/pools/lakes/oceans until your wounds have fully healed.  ACTIVITIES as tolerated:   Avoid heavy lifting (>10lbs or 1 gallon of milk) for the next 6 weeks. You may resume regular (light) daily activities beginning the next day--such as daily self-care, walking, climbing stairs--gradually increasing activities as tolerated.  If you can walk 30 minutes without difficulty, it is safe to try more intense activity such as jogging, treadmill, bicycling, low-impact aerobics.  DO NOT PUSH THROUGH PAIN.  Let pain be your guide: If it hurts to do something, don't do it. You may drive when you are no longer taking prescription pain medication, you can comfortably wear a seatbelt, and you can safely maneuver your car and apply brakes.   FOLLOW UP in our office Please call [facility] at [phone] to set up an appointment to see your surgeon in the office for a follow-up appointment approximately 2 weeks after your surgery. Make sure that you call for this appointment the day you arrive home to   insure a convenient appointment time.  9. If you have disability or family leave forms that need to be completed, you may have them completed by your primary care physician's office; for return to work instructions, please ask our office staff and they will be happy to assist you in obtaining this documentation   When to call us [phone]: Poor pain control Reactions / problems with new  medications (rash/itching, etc)  Fever over 101.5 F (38.5 C) Inability to urinate Nausea/vomiting Worsening swelling or bruising Continued bleeding from incision. Increased pain, redness, or drainage from the incision  The clinic staff is available to answer your questions during regular business hours (8:30am-5pm).  Please don't hesitate to call and ask to speak to one of our nurses for clinical concerns.   A surgeon from [facility] is always on call at the hospitals   If you have a medical emergency, go to the nearest emergency room or call 911.  [facility] [address], [address], [place], [place]  [address] MAIN: [phone] FAX: [phone] [email or web address]  

## 2021-12-17 NOTE — Anesthesia Preprocedure Evaluation (Addendum)
Anesthesia Evaluation  Patient identified by MRN, date of birth, ID band Patient awake    Reviewed: Allergy & Precautions, NPO status , Patient's Chart, lab work & pertinent test results  History of Anesthesia Complications (+) PONV and history of anesthetic complications  Airway Mallampati: III  TM Distance: >3 FB Neck ROM: Full    Dental no notable dental hx. (+) Dental Advisory Given   Pulmonary sleep apnea , pneumonia, resolved, Current SmokerPatient did not abstain from smoking.,    Pulmonary exam normal breath sounds clear to auscultation       Cardiovascular + dysrhythmias  Rhythm:Regular Rate:Normal  EKG 09/2021 NSR, incomplete RBBB pattern, LAD, ?RVH   Neuro/Psych PSYCHIATRIC DISORDERS Anxiety Depression Hx/o cognitive impairment Neuromuscular disease    GI/Hepatic negative GI ROS, (+)     substance abuse  alcohol use and marijuana use, Rectal Ca   Endo/Other  negative endocrine ROS  Renal/GU negative Renal ROS  negative genitourinary   Musculoskeletal negative musculoskeletal ROS (+)   Abdominal (+) + obese,   Peds  Hematology negative hematology ROS (+)   Anesthesia Other Findings RECTAL CANCER  Reproductive/Obstetrics                           Anesthesia Physical Anesthesia Plan  ASA: 3  Anesthesia Plan: General   Post-op Pain Management: Minimal or no pain anticipated   Induction: Intravenous  PONV Risk Score and Plan: 3 and Ondansetron, Dexamethasone, Midazolam and Treatment may vary due to age or medical condition  Airway Management Planned: LMA  Additional Equipment: None  Intra-op Plan:   Post-operative Plan: Extubation in OR  Informed Consent: I have reviewed the patients History and Physical, chart, labs and discussed the procedure including the risks, benefits and alternatives for the proposed anesthesia with the patient or authorized representative who  has indicated his/her understanding and acceptance.     Dental advisory given  Plan Discussed with: CRNA and Anesthesiologist  Anesthesia Plan Comments:        Anesthesia Quick Evaluation

## 2021-12-18 ENCOUNTER — Encounter (HOSPITAL_COMMUNITY): Payer: Self-pay | Admitting: Surgery

## 2021-12-19 ENCOUNTER — Inpatient Hospital Stay: Payer: BC Managed Care – PPO | Attending: Oncology

## 2021-12-19 ENCOUNTER — Encounter: Payer: Self-pay | Admitting: *Deleted

## 2021-12-19 VITALS — BP 123/69 | HR 66 | Resp 18 | Ht 73.0 in | Wt 264.4 lb

## 2021-12-19 DIAGNOSIS — F329 Major depressive disorder, single episode, unspecified: Secondary | ICD-10-CM | POA: Insufficient documentation

## 2021-12-19 DIAGNOSIS — G629 Polyneuropathy, unspecified: Secondary | ICD-10-CM | POA: Diagnosis not present

## 2021-12-19 DIAGNOSIS — H532 Diplopia: Secondary | ICD-10-CM | POA: Insufficient documentation

## 2021-12-19 DIAGNOSIS — Z452 Encounter for adjustment and management of vascular access device: Secondary | ICD-10-CM | POA: Insufficient documentation

## 2021-12-19 DIAGNOSIS — C20 Malignant neoplasm of rectum: Secondary | ICD-10-CM | POA: Insufficient documentation

## 2021-12-19 DIAGNOSIS — Z5111 Encounter for antineoplastic chemotherapy: Secondary | ICD-10-CM | POA: Diagnosis not present

## 2021-12-19 MED ORDER — DEXTROSE 5 % IV SOLN: Freq: Once | INTRAVENOUS | Status: AC

## 2021-12-19 MED ORDER — FLUOROURACIL CHEMO INJECTION 2.5 GM/50ML
400.0000 mg/m2 | Freq: Once | INTRAVENOUS | Status: AC
  Administered 2021-12-19: 1000 mg via INTRAVENOUS
  Filled 2021-12-19: qty 20

## 2021-12-19 MED ORDER — LEUCOVORIN CALCIUM INJECTION 350 MG
400.0000 mg/m2 | Freq: Once | INTRAVENOUS | Status: AC
  Administered 2021-12-19: 976 mg via INTRAVENOUS
  Filled 2021-12-19: qty 48.8

## 2021-12-19 MED ORDER — PALONOSETRON HCL INJECTION 0.25 MG/5ML
0.2500 mg | Freq: Once | INTRAVENOUS | Status: AC
  Administered 2021-12-19: 0.25 mg via INTRAVENOUS
  Filled 2021-12-19: qty 5

## 2021-12-19 MED ORDER — SODIUM CHLORIDE 0.9 % IV SOLN
2400.0000 mg/m2 | INTRAVENOUS | Status: DC
  Administered 2021-12-19: 5850 mg via INTRAVENOUS
  Filled 2021-12-19: qty 50

## 2021-12-19 MED ORDER — OXALIPLATIN CHEMO INJECTION 100 MG/20ML
82.0000 mg/m2 | Freq: Once | INTRAVENOUS | Status: AC
  Administered 2021-12-19: 200 mg via INTRAVENOUS
  Filled 2021-12-19: qty 40

## 2021-12-19 MED ORDER — SODIUM CHLORIDE 0.9 % IV SOLN
10.0000 mg | Freq: Once | INTRAVENOUS | Status: AC
  Administered 2021-12-19: 10 mg via INTRAVENOUS
  Filled 2021-12-19: qty 10

## 2021-12-19 NOTE — Patient Instructions (Addendum)
[facility]  The chemotherapy medication bag should finish at 46 hours, 96 hours, or 7 days. For example, if your pump is scheduled for 46 hours and it was put on at 4:00 p.m., it should finish at 2:00 p.m. the day it is scheduled to come off regardless of your appointment time.     Estimated time to finish at 11:45 Friday, December 21, 2021.   If the display on your pump reads "Low Volume" and it is beeping, take the batteries out of the pump and come to the cancer center for it to be taken off.   If the pump alarms go off prior to the pump reading "Low Volume" then call [phone] and someone can assist you.  If the plunger comes out and the chemotherapy medication is leaking out, please use your home chemo spill kit to clean up the spill. Do NOT use paper towels or other household products.  If you have problems or questions regarding your pump, please call either [phone] (24 hours a day) or the cancer center Monday-Friday 8:00 a.m.- 4:30 p.m. at the clinic number and we will assist you. If you are unable to get assistance, then go to the nearest Emergency Department and ask the staff to contact the IV team for assistance.   Discharge Instructions: Thank you for choosing [facility] to provide your oncology and hematology care.   If you have a lab appointment with the [facility], please go directly to the [facility] and check in at the registration area.   Wear comfortable clothing and clothing appropriate for easy access to any Portacath or PICC line.   We strive to give you quality time with your provider. You may need to reschedule your appointment if you arrive late (15 or more minutes).  Arriving late affects you and other patients whose appointments are after yours.  Also, if you miss three or more appointments without notifying the office, you may be dismissed from the clinic at the provider's discretion.      For prescription  refill requests, have your pharmacy contact our office and allow 72 hours for refills to be completed.    Today you received the following chemotherapy and/or immunotherapy agents Oxaliplatin, Leucovorin, Fluorouracil.       To help prevent nausea and vomiting after your treatment, we encourage you to take your nausea medication as directed.  BELOW ARE SYMPTOMS THAT SHOULD BE REPORTED IMMEDIATELY: *FEVER GREATER THAN 100.4 F (38 C) OR HIGHER *CHILLS OR SWEATING *NAUSEA AND VOMITING THAT IS NOT CONTROLLED WITH YOUR NAUSEA MEDICATION *UNUSUAL SHORTNESS OF BREATH *UNUSUAL BRUISING OR BLEEDING *URINARY PROBLEMS (pain or burning when urinating, or frequent urination) *BOWEL PROBLEMS (unusual diarrhea, constipation, pain near the anus) TENDERNESS IN MOUTH AND THROAT WITH OR WITHOUT PRESENCE OF ULCERS (sore throat, sores in mouth, or a toothache) UNUSUAL RASH, SWELLING OR PAIN  UNUSUAL VAGINAL DISCHARGE OR ITCHING   Items with * indicate a potential emergency and should be followed up as soon as possible or go to the Emergency Department if any problems should occur.  Please show the CHEMOTHERAPY ALERT CARD or IMMUNOTHERAPY ALERT CARD at check-in to the Emergency Department and triage nurse.  Should you have questions after your visit or need to cancel or reschedule your appointment, please contact [facility]  Dept: [phone]  and follow the prompts.  Office hours are 8:00 a.m. to 4:30 p.m. Monday - Friday. Please note that  voicemails left after 4:00 p.m. may not be returned until the following business day.  We are closed weekends and major holidays. You have access to a nurse at all times for urgent questions. Please call the main number to the clinic Dept: [phone] and follow the prompts.   For any non-urgent questions, you may also contact your provider using MyChart. We now offer e-Visits for anyone 40 and older to request care online for non-urgent  symptoms. For details visit [email or web address].   Also download the MyChart app! Go to the app store, search "MyChart", open the app, select [facility], and log in with your MyChart username and password.  Masks are optional in the cancer centers. If you would like for your care team to wear a mask while they are taking care of you, please let them know. For doctor visits, patients may have with them one support person who is at least 59 years old. At this time, visitors are not allowed in the infusion area.  Oxaliplatin Injection What is this medication? OXALIPLATIN (ox AL i PLA tin) is a chemotherapy drug. It targets fast dividing cells, like cancer cells, and causes these cells to die. This medicine is used to treat cancers of the colon and rectum, and many other cancers. This medicine may be used for other purposes; ask your health care provider or pharmacist if you have questions. COMMON BRAND NAME(S): Eloxatin What should I tell my care team before I take this medication? They need to know if you have any of these conditions: heart disease history of irregular heartbeat liver disease low blood counts, like white cells, platelets, or red blood cells lung or breathing disease, like asthma take medicines that treat or prevent blood clots tingling of the fingers or toes, or other nerve disorder an unusual or allergic reaction to oxaliplatin, other chemotherapy, other medicines, foods, dyes, or preservatives pregnant or trying to get pregnant breast-feeding How should I use this medication? This drug is given as an infusion into a vein. It is administered in a hospital or clinic by a specially trained health care professional. Talk to your pediatrician regarding the use of this medicine in children. Special care may be needed. Overdosage: If you think you have taken too much of this medicine contact a poison control center or emergency room at once. NOTE: This medicine is only for  you. Do not share this medicine with others. What if I miss a dose? It is important not to miss a dose. Call your doctor or health care professional if you are unable to keep an appointment. What may interact with this medication? Do not take this medicine with any of the following medications: cisapride dronedarone pimozide thioridazine This medicine may also interact with the following medications: aspirin and aspirin-like medicines certain medicines that treat or prevent blood clots like warfarin, apixaban, dabigatran, and rivaroxaban cisplatin cyclosporine diuretics medicines for infection like acyclovir, adefovir, amphotericin B, bacitracin, cidofovir, foscarnet, ganciclovir, gentamicin, pentamidine, vancomycin NSAIDs, medicines for pain and inflammation, like ibuprofen or naproxen other medicines that prolong the QT interval (an abnormal heart rhythm) pamidronate zoledronic acid This list may not describe all possible interactions. Give your health care provider a list of all the medicines, herbs, non-prescription drugs, or dietary supplements you use. Also tell them if you smoke, drink alcohol, or use illegal drugs. Some items may interact with your medicine. What should I watch for while using this medication? Your condition will be monitored carefully while you are receiving this  medicine. You may need blood work done while you are taking this medicine. This medicine may make you feel generally unwell. This is not uncommon as chemotherapy can affect healthy cells as well as cancer cells. Report any side effects. Continue your course of treatment even though you feel ill unless your healthcare professional tells you to stop. This medicine can make you more sensitive to cold. Do not drink cold drinks or use ice. Cover exposed skin before coming in contact with cold temperatures or cold objects. When out in cold weather wear warm clothing and cover your mouth and nose to warm the air  that goes into your lungs. Tell your doctor if you get sensitive to the cold. Do not become pregnant while taking this medicine or for 9 months after stopping it. Women should inform their health care professional if they wish to become pregnant or think they might be pregnant. Men should not father a child while taking this medicine and for 6 months after stopping it. There is potential for serious side effects to an unborn child. Talk to your health care professional for more information. Do not breast-feed a child while taking this medicine or for 3 months after stopping it. This medicine has caused ovarian failure in some women. This medicine may make it more difficult to get pregnant. Talk to your health care professional if you are concerned about your fertility. This medicine has caused decreased sperm counts in some men. This may make it more difficult to father a child. Talk to your health care professional if you are concerned about your fertility. This medicine may increase your risk of getting an infection. Call your health care professional for advice if you get a fever, chills, or sore throat, or other symptoms of a cold or flu. Do not treat yourself. Try to avoid being around people who are sick. Avoid taking medicines that contain aspirin, acetaminophen, ibuprofen, naproxen, or ketoprofen unless instructed by your health care professional. These medicines may hide a fever. Be careful brushing or flossing your teeth or using a toothpick because you may get an infection or bleed more easily. If you have any dental work done, tell your dentist you are receiving this medicine. What side effects may I notice from receiving this medication? Side effects that you should report to your doctor or health care professional as soon as possible: allergic reactions like skin rash, itching or hives, swelling of the face, lips, or tongue breathing problems cough low blood counts - this medicine may  decrease the number of white blood cells, red blood cells, and platelets. You may be at increased risk for infections and bleeding nausea, vomiting pain, redness, or irritation at site where injected pain, tingling, numbness in the hands or feet signs and symptoms of bleeding such as bloody or black, tarry stools; red or dark brown urine; spitting up blood or brown material that looks like coffee grounds; red spots on the skin; unusual bruising or bleeding from the eyes, gums, or nose signs and symptoms of a dangerous change in heartbeat or heart rhythm like chest pain; dizziness; fast, irregular heartbeat; palpitations; feeling faint or lightheaded; falls signs and symptoms of infection like fever; chills; cough; sore throat; pain or trouble passing urine signs and symptoms of liver injury like dark yellow or brown urine; general ill feeling or flu-like symptoms; light-colored stools; loss of appetite; nausea; right upper belly pain; unusually weak or tired; yellowing of the eyes or skin signs and symptoms of  low red blood cells or anemia such as unusually weak or tired; feeling faint or lightheaded; falls signs and symptoms of muscle injury like dark urine; trouble passing urine or change in the amount of urine; unusually weak or tired; muscle pain; back pain Side effects that usually do not require medical attention (report to your doctor or health care professional if they continue or are bothersome): changes in taste diarrhea gas hair loss loss of appetite mouth sores This list may not describe all possible side effects. Call your doctor for medical advice about side effects. You may report side effects to FDA at 1-800-FDA-1088. Where should I keep my medication? This drug is given in a hospital or clinic and will not be stored at home. NOTE: This sheet is a summary. It may not cover all possible information. If you have questions about this medicine, talk to your doctor, pharmacist, or  health care provider.  2023 Elsevier/Gold Standard (2021-05-04 00:00:00) Leucovorin injection What is this medication? LEUCOVORIN (loo koe VOR in) is used to prevent or treat the harmful effects of some medicines. This medicine is used to treat anemia caused by a low amount of folic acid in the body. It is also used with 5-fluorouracil (5-FU) to treat colon cancer. This medicine may be used for other purposes; ask your health care provider or pharmacist if you have questions. What should I tell my care team before I take this medication? They need to know if you have any of these conditions: anemia from low levels of vitamin B-12 in the blood an unusual or allergic reaction to leucovorin, folic acid, other medicines, foods, dyes, or preservatives pregnant or trying to get pregnant breast-feeding How should I use this medication? This medicine is for injection into a muscle or into a vein. It is given by a health care professional in a hospital or clinic setting. Talk to your pediatrician regarding the use of this medicine in children. Special care may be needed. Overdosage: If you think you have taken too much of this medicine contact a poison control center or emergency room at once. NOTE: This medicine is only for you. Do not share this medicine with others. What if I miss a dose? This does not apply. What may interact with this medication? capecitabine fluorouracil phenobarbital phenytoin primidone trimethoprim-sulfamethoxazole This list may not describe all possible interactions. Give your health care provider a list of all the medicines, herbs, non-prescription drugs, or dietary supplements you use. Also tell them if you smoke, drink alcohol, or use illegal drugs. Some items may interact with your medicine. What should I watch for while using this medication? Your condition will be monitored carefully while you are receiving this medicine. This medicine may increase the side  effects of 5-fluorouracil, 5-FU. Tell your doctor or health care professional if you have diarrhea or mouth sores that do not get better or that get worse. What side effects may I notice from receiving this medication? Side effects that you should report to your doctor or health care professional as soon as possible: allergic reactions like skin rash, itching or hives, swelling of the face, lips, or tongue breathing problems fever, infection mouth sores unusual bleeding or bruising unusually weak or tired Side effects that usually do not require medical attention (report to your doctor or health care professional if they continue or are bothersome): constipation or diarrhea loss of appetite nausea, vomiting This list may not describe all possible side effects. Call your doctor for medical advice  about side effects. You may report side effects to FDA at 1-800-FDA-1088. Where should I keep my medication? This drug is given in a hospital or clinic and will not be stored at home. NOTE: This sheet is a summary. It may not cover all possible information. If you have questions about this medicine, talk to your doctor, pharmacist, or health care provider.  2023 Elsevier/Gold Standard (2007-12-10 00:00:00) Fluorouracil, 5-FU injection What is this medication? FLUOROURACIL, 5-FU (flure oh YOOR a sil) is a chemotherapy drug. It slows the growth of cancer cells. This medicine is used to treat many types of cancer like breast cancer, colon or rectal cancer, pancreatic cancer, and stomach cancer. This medicine may be used for other purposes; ask your health care provider or pharmacist if you have questions. COMMON BRAND NAME(S): Adrucil What should I tell my care team before I take this medication? They need to know if you have any of these conditions: blood disorders dihydropyrimidine dehydrogenase (DPD) deficiency infection (especially a virus infection such as chickenpox, cold sores, or  herpes) kidney disease liver disease malnourished, poor nutrition recent or ongoing radiation therapy an unusual or allergic reaction to fluorouracil, other chemotherapy, other medicines, foods, dyes, or preservatives pregnant or trying to get pregnant breast-feeding How should I use this medication? This drug is given as an infusion or injection into a vein. It is administered in a hospital or clinic by a specially trained health care professional. Talk to your pediatrician regarding the use of this medicine in children. Special care may be needed. Overdosage: If you think you have taken too much of this medicine contact a poison control center or emergency room at once. NOTE: This medicine is only for you. Do not share this medicine with others. What if I miss a dose? It is important not to miss your dose. Call your doctor or health care professional if you are unable to keep an appointment. What may interact with this medication? Do not take this medicine with any of the following medications: live virus vaccines This medicine may also interact with the following medications: medicines that treat or prevent blood clots like warfarin, enoxaparin, and dalteparin This list may not describe all possible interactions. Give your health care provider a list of all the medicines, herbs, non-prescription drugs, or dietary supplements you use. Also tell them if you smoke, drink alcohol, or use illegal drugs. Some items may interact with your medicine. What should I watch for while using this medication? Visit your doctor for checks on your progress. This drug may make you feel generally unwell. This is not uncommon, as chemotherapy can affect healthy cells as well as cancer cells. Report any side effects. Continue your course of treatment even though you feel ill unless your doctor tells you to stop. In some cases, you may be given additional medicines to help with side effects. Follow all directions  for their use. Call your doctor or health care professional for advice if you get a fever, chills or sore throat, or other symptoms of a cold or flu. Do not treat yourself. This drug decreases your body's ability to fight infections. Try to avoid being around people who are sick. This medicine may increase your risk to bruise or bleed. Call your doctor or health care professional if you notice any unusual bleeding. Be careful brushing and flossing your teeth or using a toothpick because you may get an infection or bleed more easily. If you have any dental work done, tell your  dentist you are receiving this medicine. Avoid taking products that contain aspirin, acetaminophen, ibuprofen, naproxen, or ketoprofen unless instructed by your doctor. These medicines may hide a fever. Do not become pregnant while taking this medicine. Women should inform their doctor if they wish to become pregnant or think they might be pregnant. There is a potential for serious side effects to an unborn child. Talk to your health care professional or pharmacist for more information. Do not breast-feed an infant while taking this medicine. Men should inform their doctor if they wish to father a child. This medicine may lower sperm counts. Do not treat diarrhea with over the counter products. Contact your doctor if you have diarrhea that lasts more than 2 days or if it is severe and watery. This medicine can make you more sensitive to the sun. Keep out of the sun. If you cannot avoid being in the sun, wear protective clothing and use sunscreen. Do not use sun lamps or tanning beds/booths. What side effects may I notice from receiving this medication? Side effects that you should report to your doctor or health care professional as soon as possible: allergic reactions like skin rash, itching or hives, swelling of the face, lips, or tongue low blood counts - this medicine may decrease the number of white blood cells, red blood cells  and platelets. You may be at increased risk for infections and bleeding. signs of infection - fever or chills, cough, sore throat, pain or difficulty passing urine signs of decreased platelets or bleeding - bruising, pinpoint red spots on the skin, black, tarry stools, blood in the urine signs of decreased red blood cells - unusually weak or tired, fainting spells, lightheadedness breathing problems changes in vision chest pain mouth sores nausea and vomiting pain, swelling, redness at site where injected pain, tingling, numbness in the hands or feet redness, swelling, or sores on hands or feet stomach pain unusual bleeding Side effects that usually do not require medical attention (report to your doctor or health care professional if they continue or are bothersome): changes in finger or toe nails diarrhea dry or itchy skin hair loss headache loss of appetite sensitivity of eyes to the light stomach upset unusually teary eyes This list may not describe all possible side effects. Call your doctor for medical advice about side effects. You may report side effects to FDA at 1-800-FDA-1088. Where should I keep my medication? This drug is given in a hospital or clinic and will not be stored at home. NOTE: This sheet is a summary. It may not cover all possible information. If you have questions about this medicine, talk to your doctor, pharmacist, or health care provider.  2023 Elsevier/Gold Standard (2021-05-04 00:00:00)

## 2021-12-19 NOTE — Telephone Encounter (Signed)
As discussed with patient on 12/13/2021. I need official written or verbal report from patient's psychiatrist prior to prescribing.

## 2021-12-20 ENCOUNTER — Encounter: Payer: Self-pay | Admitting: *Deleted

## 2021-12-20 ENCOUNTER — Encounter: Payer: Self-pay | Admitting: Oncology

## 2021-12-20 NOTE — Progress Notes (Signed)
PATIENT NAVIGATOR PROGRESS NOTE  Name: Billy Roman Date: 12/20/2021 MRN: [id number]  DOB: 02/21/1961   Reason for visit: Telephone visit  Comments:  Called patient after first treatment and he stated that he is eating and drinking today but having mild nausea. We reviewed how to take anti nausea medication Prochlorperazine every 6 hours as needed and that he received Aloxi in IV so not to start Ondansetron until Day4 after chemo which would be Saturday Verbalized understanding   Time spent counseling/coordinating care: 30-45 minutes

## 2021-12-21 ENCOUNTER — Inpatient Hospital Stay: Payer: BC Managed Care – PPO

## 2021-12-21 VITALS — BP 119/77 | HR 63 | Temp 98.6°F | Resp 18

## 2021-12-21 DIAGNOSIS — F329 Major depressive disorder, single episode, unspecified: Secondary | ICD-10-CM | POA: Diagnosis not present

## 2021-12-21 DIAGNOSIS — G629 Polyneuropathy, unspecified: Secondary | ICD-10-CM | POA: Diagnosis not present

## 2021-12-21 DIAGNOSIS — Z5111 Encounter for antineoplastic chemotherapy: Secondary | ICD-10-CM | POA: Diagnosis not present

## 2021-12-21 DIAGNOSIS — C20 Malignant neoplasm of rectum: Secondary | ICD-10-CM | POA: Diagnosis not present

## 2021-12-21 DIAGNOSIS — Z452 Encounter for adjustment and management of vascular access device: Secondary | ICD-10-CM | POA: Diagnosis not present

## 2021-12-21 DIAGNOSIS — H532 Diplopia: Secondary | ICD-10-CM | POA: Diagnosis not present

## 2021-12-21 MED ORDER — HEPARIN SOD (PORK) LOCK FLUSH 100 UNIT/ML IV SOLN
500.0000 [IU] | Freq: Once | INTRAVENOUS | Status: AC | PRN
  Administered 2021-12-21: 500 [IU]

## 2021-12-21 MED ORDER — SODIUM CHLORIDE 0.9% FLUSH
10.0000 mL | INTRAVENOUS | Status: DC | PRN
  Administered 2021-12-21: 10 mL

## 2021-12-21 NOTE — Patient Instructions (Signed)
Heparin injection ?What is this medication? ?HEPARIN (HEP a rin) is an anticoagulant. It is used to treat or prevent clots in the veins, arteries, lungs, or heart. It stops clots from forming or getting bigger. This medicine prevents clotting during open-heart surgery, dialysis, or in patients who are confined to bed. ?This medicine may be used for other purposes; ask your health care provider or pharmacist if you have questions. ?COMMON BRAND NAME(S): Hep-Lock, Hep-Lock U/P, Hepflush-10, Monoject Prefill Advanced Heparin Lock Flush, SASH Normal Saline and Heparin ?What should I tell my care team before I take this medication? ?They need to know if you have any of these conditions: ?bleeding disorders, such as hemophilia or low blood platelets ?bowel disease or diverticulitis ?endocarditis ?high blood pressure ?liver disease ?recent surgery or delivery of a baby ?stomach ulcers ?an unusual or allergic reaction to heparin, benzyl alcohol, sulfites, other medicines, foods, dyes, or preservatives ?pregnant or trying to get pregnant ?breast-feeding ?How should I use this medication? ?This medicine is given by injection or infusion into a vein. It can also be given by injection of small amounts under the skin. It is usually given by a health care professional in a hospital or clinic setting. ?If you get this medicine at home, you will be taught how to prepare and give this medicine. Use exactly as directed. Take your medicine at regular intervals. Do not take it more often than directed. Do not stop taking except on your doctor's advice. Stopping this medicine may increase your risk of a blot clot. Be sure to refill your prescription before you run out of medicine. ?It is important that you put your used needles and syringes in a special sharps container. Do not put them in a trash can. If you do not have a sharps container, call your pharmacist or healthcare provider to get one. ?Talk to your pediatrician regarding the  use of this medicine in children. While this medicine may be prescribed for children for selected conditions, precautions do apply. ?Overdosage: If you think you have taken too much of this medicine contact a poison control center or emergency room at once. ?NOTE: This medicine is only for you. Do not share this medicine with others. ?What if I miss a dose? ?If you miss a dose, take it as soon as you can. If it is almost time for your next dose, take only that dose. Do not take double or extra doses. ?What may interact with this medication? ?Do not take this medicine with any of the following medications: ?aspirin and aspirin-like drugs ?mifepristone ?medicines that treat or prevent blood clots like warfarin, enoxaparin, and dalteparin ?palifermin ?protamine ?This medicine may also interact with the following medications: ?dextran ?digoxin ?hydroxychloroquine ?medicines for treating colds or allergies ?nicotine ?NSAIDs, medicines for pain and inflammation, like ibuprofen or naproxen ?phenylbutazone ?tetracycline antibiotics ?This list may not describe all possible interactions. Give your health care provider a list of all the medicines, herbs, non-prescription drugs, or dietary supplements you use. Also tell them if you smoke, drink alcohol, or use illegal drugs. Some items may interact with your medicine. ?What should I watch for while using this medication? ?Visit your healthcare professional for regular checks on your progress. You may need blood work done while you are taking this medicine. Your condition will be monitored carefully while you are receiving this medicine. It is important not to miss any appointments. ?Wear a medical ID bracelet or chain, and carry a card that describes your disease and details   of your medicine and dosage times. ?Notify your doctor or healthcare professional at once if you have cold, blue hands or feet. ?If you are going to need surgery or other procedure, tell your healthcare  professional that you are using this medicine. ?Avoid sports and activities that might cause injury while you are using this medicine. Severe falls or injuries can cause unseen bleeding. Be careful when using sharp tools or knives. Consider using an electric razor. Take special care brushing or flossing your teeth. Report any injuries, bruising, or red spots on the skin to your healthcare professional. ?Using this medicine for a long time may weaken your bones and increase the risk of bone fractures. ?You should make sure that you get enough calcium and vitamin D while you are taking this medicine. Discuss the foods you eat and the vitamins you take with your healthcare professional. ?Wear a medical ID bracelet or chain. Carry a card that describes your disease and details of your medicine and dosage times. ?What side effects may I notice from receiving this medication? ?Side effects that you should report to your doctor or health care professional as soon as possible: ?allergic reactions like skin rash, itching or hives, swelling of the face, lips, or tongue ?bone pain ?fever, chills ?nausea, vomiting ?signs and symptoms of bleeding such as bloody or black, tarry stools; red or dark-brown urine; spitting up blood or brown material that looks like coffee grounds; red spots on the skin; unusual bruising or bleeding from the eye, gums, or nose ?signs and symptoms of a blood clot such as chest pain; shortness of breath; pain, swelling, or warmth in the leg ?signs and symptoms of a stroke such as changes in vision; confusion; trouble speaking or understanding; severe headaches; sudden numbness or weakness of the face, arm or leg; trouble walking; dizziness; loss of coordination ?Side effects that usually do not require medical attention (report to your doctor or health care professional if they continue or are bothersome): ?hair loss ?pain, redness, or irritation at site where injected ?This list may not describe all  possible side effects. Call your doctor for medical advice about side effects. You may report side effects to FDA at 1-800-FDA-1088. ?Where should I keep my medication? ?Keep out of the reach of children. ?Store unopened vials at room temperature between 15 and 30 degrees C (59 and 86 degrees F). Do not freeze. Do not use if solution is discolored or particulate matter is present. Throw away any unused medicine after the expiration date. ?NOTE: This sheet is a summary. It may not cover all possible information. If you have questions about this medicine, talk to your doctor, pharmacist, or health care provider. ?? 2023 [employer]/Gold Standard (2020-07-13 00:00:00) ? ?

## 2021-12-27 ENCOUNTER — Encounter: Payer: Self-pay | Admitting: Oncology

## 2021-12-30 ENCOUNTER — Other Ambulatory Visit: Payer: Self-pay | Admitting: Oncology

## 2022-01-02 ENCOUNTER — Inpatient Hospital Stay: Payer: BC Managed Care – PPO

## 2022-01-02 ENCOUNTER — Other Ambulatory Visit: Payer: Self-pay

## 2022-01-02 ENCOUNTER — Inpatient Hospital Stay (HOSPITAL_BASED_OUTPATIENT_CLINIC_OR_DEPARTMENT_OTHER): Payer: BC Managed Care – PPO | Admitting: Oncology

## 2022-01-02 ENCOUNTER — Encounter: Payer: Self-pay | Admitting: *Deleted

## 2022-01-02 VITALS — BP 134/84 | HR 65 | Resp 18

## 2022-01-02 VITALS — BP 104/70 | HR 74 | Temp 98.1°F | Resp 18 | Wt 249.6 lb

## 2022-01-02 DIAGNOSIS — Z452 Encounter for adjustment and management of vascular access device: Secondary | ICD-10-CM | POA: Diagnosis not present

## 2022-01-02 DIAGNOSIS — C20 Malignant neoplasm of rectum: Secondary | ICD-10-CM

## 2022-01-02 DIAGNOSIS — F329 Major depressive disorder, single episode, unspecified: Secondary | ICD-10-CM | POA: Diagnosis not present

## 2022-01-02 DIAGNOSIS — H532 Diplopia: Secondary | ICD-10-CM | POA: Diagnosis not present

## 2022-01-02 DIAGNOSIS — Z5111 Encounter for antineoplastic chemotherapy: Secondary | ICD-10-CM | POA: Diagnosis not present

## 2022-01-02 DIAGNOSIS — G629 Polyneuropathy, unspecified: Secondary | ICD-10-CM | POA: Diagnosis not present

## 2022-01-02 LAB — CBC WITH DIFFERENTIAL (CANCER CENTER ONLY)
Abs Immature Granulocytes: 0.02 10*3/uL (ref 0.00–0.07)
Basophils Absolute: 0.1 10*3/uL (ref 0.0–0.1)
Basophils Relative: 1 %
Eosinophils Absolute: 0.3 10*3/uL (ref 0.0–0.5)
Eosinophils Relative: 6 %
HCT: 44.2 % (ref 39.0–52.0)
Hemoglobin: 14.9 g/dL (ref 13.0–17.0)
Immature Granulocytes: 0 %
Lymphocytes Relative: 34 %
Lymphs Abs: 1.9 10*3/uL (ref 0.7–4.0)
MCH: 29.4 pg (ref 26.0–34.0)
MCHC: 33.7 g/dL (ref 30.0–36.0)
MCV: 87.4 fL (ref 80.0–100.0)
Monocytes Absolute: 0.6 10*3/uL (ref 0.1–1.0)
Monocytes Relative: 11 %
Neutro Abs: 2.7 10*3/uL (ref 1.7–7.7)
Neutrophils Relative %: 48 %
Platelet Count: 206 10*3/uL (ref 150–400)
RBC: 5.06 MIL/uL (ref 4.22–5.81)
RDW: 14.2 % (ref 11.5–15.5)
WBC Count: 5.6 10*3/uL (ref 4.0–10.5)
nRBC: 0 % (ref 0.0–0.2)

## 2022-01-02 LAB — CMP (CANCER CENTER ONLY)
ALT: 23 U/L (ref 0–44)
AST: 27 U/L (ref 15–41)
Albumin: 4.5 g/dL (ref 3.5–5.0)
Alkaline Phosphatase: 71 U/L (ref 38–126)
Anion gap: 9 (ref 5–15)
BUN: 13 mg/dL (ref 6–20)
CO2: 26 mmol/L (ref 22–32)
Calcium: 10 mg/dL (ref 8.9–10.3)
Chloride: 104 mmol/L (ref 98–111)
Creatinine: 0.75 mg/dL (ref 0.61–1.24)
GFR, Estimated: >60 mL/min (ref >60)
Glucose, Bld: 105 mg/dL — ABNORMAL HIGH (ref 70–99)
Potassium: 4.2 mmol/L (ref 3.5–5.1)
Sodium: 139 mmol/L (ref 135–145)
Total Bilirubin: 0.4 mg/dL (ref 0.3–1.2)
Total Protein: 7.7 g/dL (ref 6.5–8.1)

## 2022-01-02 LAB — RESEARCH LABS

## 2022-01-02 MED ORDER — PALONOSETRON HCL INJECTION 0.25 MG/5ML
0.2500 mg | Freq: Once | INTRAVENOUS | Status: AC
  Administered 2022-01-02: 0.25 mg via INTRAVENOUS
  Filled 2022-01-02: qty 5

## 2022-01-02 MED ORDER — LEUCOVORIN CALCIUM INJECTION 350 MG
400.0000 mg/m2 | Freq: Once | INTRAVENOUS | Status: AC
  Administered 2022-01-02: 964 mg via INTRAVENOUS
  Filled 2022-01-02: qty 25

## 2022-01-02 MED ORDER — DEXTROSE 5 % IV SOLN: Freq: Once | INTRAVENOUS | Status: AC

## 2022-01-02 MED ORDER — FLUOROURACIL CHEMO INJECTION 2.5 GM/50ML
400.0000 mg/m2 | Freq: Once | INTRAVENOUS | Status: AC
  Administered 2022-01-02: 950 mg via INTRAVENOUS
  Filled 2022-01-02: qty 19

## 2022-01-02 MED ORDER — DEXAMETHASONE 4 MG PO TABS: 4.0000 mg | ORAL_TABLET | Freq: Two times a day (BID) | ORAL | 0 refills | Status: AC

## 2022-01-02 MED ORDER — LEUCOVORIN CALCIUM INJECTION 350 MG
400.0000 mg/m2 | Freq: Once | INTRAVENOUS | Status: DC
  Filled 2022-01-02: qty 48.2

## 2022-01-02 MED ORDER — SODIUM CHLORIDE 0.9 % IV SOLN
10.0000 mg | Freq: Once | INTRAVENOUS | Status: AC
  Administered 2022-01-02: 10 mg via INTRAVENOUS
  Filled 2022-01-02: qty 10

## 2022-01-02 MED ORDER — OXALIPLATIN CHEMO INJECTION 100 MG/20ML
84.0000 mg/m2 | Freq: Once | INTRAVENOUS | Status: AC
  Administered 2022-01-02: 200 mg via INTRAVENOUS
  Filled 2022-01-02: qty 40

## 2022-01-02 MED ORDER — SODIUM CHLORIDE 0.9 % IV SOLN
2400.0000 mg/m2 | INTRAVENOUS | Status: DC
  Administered 2022-01-02: 5800 mg via INTRAVENOUS
  Filled 2022-01-02: qty 116

## 2022-01-02 NOTE — Progress Notes (Addendum)
Patient seen by Dr. Benay Spice today  Vitals are within treatment parameters.  Labs reviewed by Dr. Benay Spice and are within treatment parameters.  Per physician team, OK to treat w/dose reduction on 5FU based on weight.

## 2022-01-02 NOTE — Progress Notes (Signed)
  [facility] OFFICE PROGRESS NOTE   Diagnosis: Rectal cancer  INTERVAL HISTORY:   Mr. Langland completed cycle 1 FOLFOX on 12/19/2021.  He reports increased cold sensitivity.  Mild increase in hand/finger numbness.  He had 2 days of diarrhea following chemotherapy.  This improved with Imodium.  He continues to have rectal urgency.  He had nausea for 3 to 4 days beginning on the day following chemotherapy.  Compazine helped.  No emesis.  Objective:  Vital signs in last 24 hours:  Blood pressure 104/70, pulse 74, temperature 98.1 F (36.7 C), resp. rate 18, weight 249 lb 9.6 oz (113.2 kg), SpO2 98 %.    HEENT: No thrush or ulcers Resp: Distant breath sounds, scattered inspiratory rhonchi, no respiratory distress Cardio: Regular rate and rhythm GI: No hepatosplenomegaly Vascular: No leg edema    Portacath/PICC-without erythema, mild tenderness surrounding the Port-A-Cath  Lab Results:  Lab Results  Component Value Date   WBC 5.6 01/02/2022   HGB 14.9 01/02/2022   HCT 44.2 01/02/2022   MCV 87.4 01/02/2022   PLT 206 01/02/2022   NEUTROABS 2.7 01/02/2022    CMP  Lab Results  Component Value Date   NA 139 01/02/2022   K 4.2 01/02/2022   CL 104 01/02/2022   CO2 26 01/02/2022   GLUCOSE 105 (H) 01/02/2022   BUN 13 01/02/2022   CREATININE 0.75 01/02/2022   CALCIUM 10.0 01/02/2022   PROT 7.7 01/02/2022   ALBUMIN 4.5 01/02/2022   AST 27 01/02/2022   ALT 23 01/02/2022   ALKPHOS 71 01/02/2022   BILITOT 0.4 01/02/2022   GFRNONAA >60 01/02/2022   GFRAA 117 07/26/2019    Lab Results  Component Value Date   CEA 3.65 12/14/2021    Medications: I have reviewed the patient's current medications.   Assessment/Plan:  Adenocarcinoma of the proximal rectum/distal sigmoid Colonoscopy 11/19/2021-partially obstructing mass at 12-19 cm, biopsy moderate to poorly differentiated adenocarcinoma Mildly elevated CEA CTs 11/29/2021-mural thickening in the distal  sigmoid/proximal rectum with haziness of the mesorectal fat, mesorectal lymphadenopathy, small pulmonary nodules favored to be benign MRI pelvis 12/03/2021-tumor at 10 cm from the anal verge, T3c, approximately 10 metastatic nodes in the mesorectal sheath and presacral space, no extra mesorectal lymphadenopathy, N2 Cycle 1 FOLFOX 12/19/2021 Cycle 2 FOLFOX 01/02/2022, home Decadron prophylaxis added for delayed nausea Major depressive disorder Change in bowel habits and rectal bleeding secondary to #1 Ongoing tobacco use Report of blurred vision and diplopia Peripheral neuropathy-fingers and left foot    Disposition: Mr. Tuman has completed 1 cycle of neoadjuvant FOLFOX.  He tolerated the chemotherapy well aside from delayed nausea.  He will take Decadron for 2 days following this cycle of chemotherapy.  He will call for nausea.  He will complete another cycle of FOLFOX beginning today.  He will return for an office visit and chemotherapy in 2 weeks.  [name], MD  01/02/2022  11:23 AM

## 2022-01-02 NOTE — Patient Instructions (Signed)

## 2022-01-02 NOTE — Patient Instructions (Addendum)
[facility]  The chemotherapy medication bag should finish at 46 hours, 96 hours, or 7 days. For example, if your pump is scheduled for 46 hours and it was put on at 4:00 p.m., it should finish at 2:00 p.m. the day it is scheduled to come off regardless of your appointment time.     Estimated time to finish at 1pm Friday, January 04, 2022.   If the display on your pump reads "Low Volume" and it is beeping, take the batteries out of the pump and come to the cancer center for it to be taken off.   If the pump alarms go off prior to the pump reading "Low Volume" then call [phone] and someone can assist you.  If the plunger comes out and the chemotherapy medication is leaking out, please use your home chemo spill kit to clean up the spill. Do NOT use paper towels or other household products.  If you have problems or questions regarding your pump, please call either [phone] (24 hours a day) or the cancer center Monday-Friday 8:00 a.m.- 4:30 p.m. at the clinic number and we will assist you. If you are unable to get assistance, then go to the nearest Emergency Department and ask the staff to contact the IV team for assistance.   Discharge Instructions: Thank you for choosing [facility] to provide your oncology and hematology care.   If you have a lab appointment with the [facility], please go directly to the [facility] and check in at the registration area.   Wear comfortable clothing and clothing appropriate for easy access to any Portacath or PICC line.   We strive to give you quality time with your provider. You may need to reschedule your appointment if you arrive late (15 or more minutes).  Arriving late affects you and other patients whose appointments are after yours.  Also, if you miss three or more appointments without notifying the office, you may be dismissed from the clinic at the provider's discretion.      For prescription refill  requests, have your pharmacy contact our office and allow 72 hours for refills to be completed.    Today you received the following chemotherapy and/or immunotherapy agents Oxaliplatin, Leucovorin, Fluorouracil.       To help prevent nausea and vomiting after your treatment, we encourage you to take your nausea medication as directed.  BELOW ARE SYMPTOMS THAT SHOULD BE REPORTED IMMEDIATELY: *FEVER GREATER THAN 100.4 F (38 C) OR HIGHER *CHILLS OR SWEATING *NAUSEA AND VOMITING THAT IS NOT CONTROLLED WITH YOUR NAUSEA MEDICATION *UNUSUAL SHORTNESS OF BREATH *UNUSUAL BRUISING OR BLEEDING *URINARY PROBLEMS (pain or burning when urinating, or frequent urination) *BOWEL PROBLEMS (unusual diarrhea, constipation, pain near the anus) TENDERNESS IN MOUTH AND THROAT WITH OR WITHOUT PRESENCE OF ULCERS (sore throat, sores in mouth, or a toothache) UNUSUAL RASH, SWELLING OR PAIN  UNUSUAL VAGINAL DISCHARGE OR ITCHING   Items with * indicate a potential emergency and should be followed up as soon as possible or go to the Emergency Department if any problems should occur.  Please show the CHEMOTHERAPY ALERT CARD or IMMUNOTHERAPY ALERT CARD at check-in to the Emergency Department and triage nurse.  Should you have questions after your visit or need to cancel or reschedule your appointment, please contact [facility]  Dept: [phone]  and follow the prompts.  Office hours are 8:00 a.m. to 4:30 p.m. Monday - Friday. Please note that  voicemails left after 4:00 p.m. may not be returned until the following business day.  We are closed weekends and major holidays. You have access to a nurse at all times for urgent questions. Please call the main number to the clinic Dept: [phone] and follow the prompts.   For any non-urgent questions, you may also contact your provider using MyChart. We now offer e-Visits for anyone 1 and older to request care online for non-urgent symptoms. For  details visit [email or web address].   Also download the MyChart app! Go to the app store, search "MyChart", open the app, select [facility], and log in with your MyChart username and password.  Masks are optional in the cancer centers. If you would like for your care team to wear a mask while they are taking care of you, please let them know. For doctor visits, patients may have with them one support person who is at least 59 years old. At this time, visitors are not allowed in the infusion area.  Oxaliplatin Injection What is this medication? OXALIPLATIN (ox AL i PLA tin) is a chemotherapy drug. It targets fast dividing cells, like cancer cells, and causes these cells to die. This medicine is used to treat cancers of the colon and rectum, and many other cancers. This medicine may be used for other purposes; ask your health care provider or pharmacist if you have questions. COMMON BRAND NAME(S): Eloxatin What should I tell my care team before I take this medication? They need to know if you have any of these conditions: heart disease history of irregular heartbeat liver disease low blood counts, like white cells, platelets, or red blood cells lung or breathing disease, like asthma take medicines that treat or prevent blood clots tingling of the fingers or toes, or other nerve disorder an unusual or allergic reaction to oxaliplatin, other chemotherapy, other medicines, foods, dyes, or preservatives pregnant or trying to get pregnant breast-feeding How should I use this medication? This drug is given as an infusion into a vein. It is administered in a hospital or clinic by a specially trained health care professional. Talk to your pediatrician regarding the use of this medicine in children. Special care may be needed. Overdosage: If you think you have taken too much of this medicine contact a poison control center or emergency room at once. NOTE: This medicine is only for you. Do not  share this medicine with others. What if I miss a dose? It is important not to miss a dose. Call your doctor or health care professional if you are unable to keep an appointment. What may interact with this medication? Do not take this medicine with any of the following medications: cisapride dronedarone pimozide thioridazine This medicine may also interact with the following medications: aspirin and aspirin-like medicines certain medicines that treat or prevent blood clots like warfarin, apixaban, dabigatran, and rivaroxaban cisplatin cyclosporine diuretics medicines for infection like acyclovir, adefovir, amphotericin B, bacitracin, cidofovir, foscarnet, ganciclovir, gentamicin, pentamidine, vancomycin NSAIDs, medicines for pain and inflammation, like ibuprofen or naproxen other medicines that prolong the QT interval (an abnormal heart rhythm) pamidronate zoledronic acid This list may not describe all possible interactions. Give your health care provider a list of all the medicines, herbs, non-prescription drugs, or dietary supplements you use. Also tell them if you smoke, drink alcohol, or use illegal drugs. Some items may interact with your medicine. What should I watch for while using this medication? Your condition will be monitored carefully while you are receiving this  medicine. You may need blood work done while you are taking this medicine. This medicine may make you feel generally unwell. This is not uncommon as chemotherapy can affect healthy cells as well as cancer cells. Report any side effects. Continue your course of treatment even though you feel ill unless your healthcare professional tells you to stop. This medicine can make you more sensitive to cold. Do not drink cold drinks or use ice. Cover exposed skin before coming in contact with cold temperatures or cold objects. When out in cold weather wear warm clothing and cover your mouth and nose to warm the air that goes  into your lungs. Tell your doctor if you get sensitive to the cold. Do not become pregnant while taking this medicine or for 9 months after stopping it. Women should inform their health care professional if they wish to become pregnant or think they might be pregnant. Men should not father a child while taking this medicine and for 6 months after stopping it. There is potential for serious side effects to an unborn child. Talk to your health care professional for more information. Do not breast-feed a child while taking this medicine or for 3 months after stopping it. This medicine has caused ovarian failure in some women. This medicine may make it more difficult to get pregnant. Talk to your health care professional if you are concerned about your fertility. This medicine has caused decreased sperm counts in some men. This may make it more difficult to father a child. Talk to your health care professional if you are concerned about your fertility. This medicine may increase your risk of getting an infection. Call your health care professional for advice if you get a fever, chills, or sore throat, or other symptoms of a cold or flu. Do not treat yourself. Try to avoid being around people who are sick. Avoid taking medicines that contain aspirin, acetaminophen, ibuprofen, naproxen, or ketoprofen unless instructed by your health care professional. These medicines may hide a fever. Be careful brushing or flossing your teeth or using a toothpick because you may get an infection or bleed more easily. If you have any dental work done, tell your dentist you are receiving this medicine. What side effects may I notice from receiving this medication? Side effects that you should report to your doctor or health care professional as soon as possible: allergic reactions like skin rash, itching or hives, swelling of the face, lips, or tongue breathing problems cough low blood counts - this medicine may decrease the  number of white blood cells, red blood cells, and platelets. You may be at increased risk for infections and bleeding nausea, vomiting pain, redness, or irritation at site where injected pain, tingling, numbness in the hands or feet signs and symptoms of bleeding such as bloody or black, tarry stools; red or dark brown urine; spitting up blood or brown material that looks like coffee grounds; red spots on the skin; unusual bruising or bleeding from the eyes, gums, or nose signs and symptoms of a dangerous change in heartbeat or heart rhythm like chest pain; dizziness; fast, irregular heartbeat; palpitations; feeling faint or lightheaded; falls signs and symptoms of infection like fever; chills; cough; sore throat; pain or trouble passing urine signs and symptoms of liver injury like dark yellow or brown urine; general ill feeling or flu-like symptoms; light-colored stools; loss of appetite; nausea; right upper belly pain; unusually weak or tired; yellowing of the eyes or skin signs and symptoms of  low red blood cells or anemia such as unusually weak or tired; feeling faint or lightheaded; falls signs and symptoms of muscle injury like dark urine; trouble passing urine or change in the amount of urine; unusually weak or tired; muscle pain; back pain Side effects that usually do not require medical attention (report to your doctor or health care professional if they continue or are bothersome): changes in taste diarrhea gas hair loss loss of appetite mouth sores This list may not describe all possible side effects. Call your doctor for medical advice about side effects. You may report side effects to FDA at 1-800-FDA-1088. Where should I keep my medication? This drug is given in a hospital or clinic and will not be stored at home. NOTE: This sheet is a summary. It may not cover all possible information. If you have questions about this medicine, talk to your doctor, pharmacist, or health care  provider.  2023 Elsevier/Gold Standard (2021-05-04 00:00:00) Leucovorin injection What is this medication? LEUCOVORIN (loo koe VOR in) is used to prevent or treat the harmful effects of some medicines. This medicine is used to treat anemia caused by a low amount of folic acid in the body. It is also used with 5-fluorouracil (5-FU) to treat colon cancer. This medicine may be used for other purposes; ask your health care provider or pharmacist if you have questions. What should I tell my care team before I take this medication? They need to know if you have any of these conditions: anemia from low levels of vitamin B-12 in the blood an unusual or allergic reaction to leucovorin, folic acid, other medicines, foods, dyes, or preservatives pregnant or trying to get pregnant breast-feeding How should I use this medication? This medicine is for injection into a muscle or into a vein. It is given by a health care professional in a hospital or clinic setting. Talk to your pediatrician regarding the use of this medicine in children. Special care may be needed. Overdosage: If you think you have taken too much of this medicine contact a poison control center or emergency room at once. NOTE: This medicine is only for you. Do not share this medicine with others. What if I miss a dose? This does not apply. What may interact with this medication? capecitabine fluorouracil phenobarbital phenytoin primidone trimethoprim-sulfamethoxazole This list may not describe all possible interactions. Give your health care provider a list of all the medicines, herbs, non-prescription drugs, or dietary supplements you use. Also tell them if you smoke, drink alcohol, or use illegal drugs. Some items may interact with your medicine. What should I watch for while using this medication? Your condition will be monitored carefully while you are receiving this medicine. This medicine may increase the side effects of  5-fluorouracil, 5-FU. Tell your doctor or health care professional if you have diarrhea or mouth sores that do not get better or that get worse. What side effects may I notice from receiving this medication? Side effects that you should report to your doctor or health care professional as soon as possible: allergic reactions like skin rash, itching or hives, swelling of the face, lips, or tongue breathing problems fever, infection mouth sores unusual bleeding or bruising unusually weak or tired Side effects that usually do not require medical attention (report to your doctor or health care professional if they continue or are bothersome): constipation or diarrhea loss of appetite nausea, vomiting This list may not describe all possible side effects. Call your doctor for medical advice  about side effects. You may report side effects to FDA at 1-800-FDA-1088. Where should I keep my medication? This drug is given in a hospital or clinic and will not be stored at home. NOTE: This sheet is a summary. It may not cover all possible information. If you have questions about this medicine, talk to your doctor, pharmacist, or health care provider.  2023 Elsevier/Gold Standard (2007-12-10 00:00:00) Fluorouracil, 5-FU injection What is this medication? FLUOROURACIL, 5-FU (flure oh YOOR a sil) is a chemotherapy drug. It slows the growth of cancer cells. This medicine is used to treat many types of cancer like breast cancer, colon or rectal cancer, pancreatic cancer, and stomach cancer. This medicine may be used for other purposes; ask your health care provider or pharmacist if you have questions. COMMON BRAND NAME(S): Adrucil What should I tell my care team before I take this medication? They need to know if you have any of these conditions: blood disorders dihydropyrimidine dehydrogenase (DPD) deficiency infection (especially a virus infection such as chickenpox, cold sores, or herpes) kidney  disease liver disease malnourished, poor nutrition recent or ongoing radiation therapy an unusual or allergic reaction to fluorouracil, other chemotherapy, other medicines, foods, dyes, or preservatives pregnant or trying to get pregnant breast-feeding How should I use this medication? This drug is given as an infusion or injection into a vein. It is administered in a hospital or clinic by a specially trained health care professional. Talk to your pediatrician regarding the use of this medicine in children. Special care may be needed. Overdosage: If you think you have taken too much of this medicine contact a poison control center or emergency room at once. NOTE: This medicine is only for you. Do not share this medicine with others. What if I miss a dose? It is important not to miss your dose. Call your doctor or health care professional if you are unable to keep an appointment. What may interact with this medication? Do not take this medicine with any of the following medications: live virus vaccines This medicine may also interact with the following medications: medicines that treat or prevent blood clots like warfarin, enoxaparin, and dalteparin This list may not describe all possible interactions. Give your health care provider a list of all the medicines, herbs, non-prescription drugs, or dietary supplements you use. Also tell them if you smoke, drink alcohol, or use illegal drugs. Some items may interact with your medicine. What should I watch for while using this medication? Visit your doctor for checks on your progress. This drug may make you feel generally unwell. This is not uncommon, as chemotherapy can affect healthy cells as well as cancer cells. Report any side effects. Continue your course of treatment even though you feel ill unless your doctor tells you to stop. In some cases, you may be given additional medicines to help with side effects. Follow all directions for their  use. Call your doctor or health care professional for advice if you get a fever, chills or sore throat, or other symptoms of a cold or flu. Do not treat yourself. This drug decreases your body's ability to fight infections. Try to avoid being around people who are sick. This medicine may increase your risk to bruise or bleed. Call your doctor or health care professional if you notice any unusual bleeding. Be careful brushing and flossing your teeth or using a toothpick because you may get an infection or bleed more easily. If you have any dental work done, tell your  dentist you are receiving this medicine. Avoid taking products that contain aspirin, acetaminophen, ibuprofen, naproxen, or ketoprofen unless instructed by your doctor. These medicines may hide a fever. Do not become pregnant while taking this medicine. Women should inform their doctor if they wish to become pregnant or think they might be pregnant. There is a potential for serious side effects to an unborn child. Talk to your health care professional or pharmacist for more information. Do not breast-feed an infant while taking this medicine. Men should inform their doctor if they wish to father a child. This medicine may lower sperm counts. Do not treat diarrhea with over the counter products. Contact your doctor if you have diarrhea that lasts more than 2 days or if it is severe and watery. This medicine can make you more sensitive to the sun. Keep out of the sun. If you cannot avoid being in the sun, wear protective clothing and use sunscreen. Do not use sun lamps or tanning beds/booths. What side effects may I notice from receiving this medication? Side effects that you should report to your doctor or health care professional as soon as possible: allergic reactions like skin rash, itching or hives, swelling of the face, lips, or tongue low blood counts - this medicine may decrease the number of white blood cells, red blood cells and  platelets. You may be at increased risk for infections and bleeding. signs of infection - fever or chills, cough, sore throat, pain or difficulty passing urine signs of decreased platelets or bleeding - bruising, pinpoint red spots on the skin, black, tarry stools, blood in the urine signs of decreased red blood cells - unusually weak or tired, fainting spells, lightheadedness breathing problems changes in vision chest pain mouth sores nausea and vomiting pain, swelling, redness at site where injected pain, tingling, numbness in the hands or feet redness, swelling, or sores on hands or feet stomach pain unusual bleeding Side effects that usually do not require medical attention (report to your doctor or health care professional if they continue or are bothersome): changes in finger or toe nails diarrhea dry or itchy skin hair loss headache loss of appetite sensitivity of eyes to the light stomach upset unusually teary eyes This list may not describe all possible side effects. Call your doctor for medical advice about side effects. You may report side effects to FDA at 1-800-FDA-1088. Where should I keep my medication? This drug is given in a hospital or clinic and will not be stored at home. NOTE: This sheet is a summary. It may not cover all possible information. If you have questions about this medicine, talk to your doctor, pharmacist, or health care provider.  2023 Elsevier/Gold Standard (2021-05-04 00:00:00)

## 2022-01-04 ENCOUNTER — Inpatient Hospital Stay: Payer: BC Managed Care – PPO

## 2022-01-04 VITALS — BP 121/86 | HR 71 | Temp 98.3°F | Resp 18

## 2022-01-04 DIAGNOSIS — G629 Polyneuropathy, unspecified: Secondary | ICD-10-CM | POA: Diagnosis not present

## 2022-01-04 DIAGNOSIS — C20 Malignant neoplasm of rectum: Secondary | ICD-10-CM

## 2022-01-04 DIAGNOSIS — F329 Major depressive disorder, single episode, unspecified: Secondary | ICD-10-CM | POA: Diagnosis not present

## 2022-01-04 DIAGNOSIS — Z5111 Encounter for antineoplastic chemotherapy: Secondary | ICD-10-CM | POA: Diagnosis not present

## 2022-01-04 DIAGNOSIS — H532 Diplopia: Secondary | ICD-10-CM | POA: Diagnosis not present

## 2022-01-04 DIAGNOSIS — Z452 Encounter for adjustment and management of vascular access device: Secondary | ICD-10-CM | POA: Diagnosis not present

## 2022-01-04 MED ORDER — HEPARIN SOD (PORK) LOCK FLUSH 100 UNIT/ML IV SOLN
500.0000 [IU] | Freq: Once | INTRAVENOUS | Status: AC | PRN
  Administered 2022-01-04: 500 [IU]

## 2022-01-04 MED ORDER — SODIUM CHLORIDE 0.9% FLUSH
10.0000 mL | INTRAVENOUS | Status: DC | PRN
  Administered 2022-01-04: 10 mL

## 2022-01-04 NOTE — Patient Instructions (Signed)

## 2022-01-07 ENCOUNTER — Other Ambulatory Visit: Payer: Self-pay

## 2022-01-08 ENCOUNTER — Telehealth: Payer: Self-pay

## 2022-01-08 NOTE — Telephone Encounter (Signed)
TC from Pt stating he received a note for jury duty. Informed Pt to bring in jury duty papers at his next scheduled appointment which is 01/15/22. Pt verbalized understanding.

## 2022-01-09 ENCOUNTER — Other Ambulatory Visit: Payer: Self-pay | Admitting: Oncology

## 2022-01-13 ENCOUNTER — Other Ambulatory Visit: Payer: Self-pay | Admitting: Oncology

## 2022-01-15 ENCOUNTER — Telehealth: Payer: Self-pay | Admitting: Radiation Oncology

## 2022-01-15 ENCOUNTER — Inpatient Hospital Stay: Payer: BC Managed Care – PPO

## 2022-01-15 ENCOUNTER — Inpatient Hospital Stay (HOSPITAL_BASED_OUTPATIENT_CLINIC_OR_DEPARTMENT_OTHER): Payer: BC Managed Care – PPO | Admitting: Nurse Practitioner

## 2022-01-15 ENCOUNTER — Encounter: Payer: Self-pay | Admitting: Nurse Practitioner

## 2022-01-15 ENCOUNTER — Inpatient Hospital Stay: Payer: BC Managed Care – PPO | Admitting: Licensed Clinical Social Worker

## 2022-01-15 ENCOUNTER — Inpatient Hospital Stay: Payer: BC Managed Care – PPO | Attending: Oncology

## 2022-01-15 VITALS — BP 150/84 | HR 81 | Temp 98.1°F | Resp 18 | Ht 73.0 in | Wt 254.6 lb

## 2022-01-15 VITALS — BP 131/91 | HR 67

## 2022-01-15 DIAGNOSIS — R21 Rash and other nonspecific skin eruption: Secondary | ICD-10-CM | POA: Insufficient documentation

## 2022-01-15 DIAGNOSIS — C20 Malignant neoplasm of rectum: Secondary | ICD-10-CM

## 2022-01-15 DIAGNOSIS — R197 Diarrhea, unspecified: Secondary | ICD-10-CM | POA: Insufficient documentation

## 2022-01-15 DIAGNOSIS — F329 Major depressive disorder, single episode, unspecified: Secondary | ICD-10-CM | POA: Insufficient documentation

## 2022-01-15 DIAGNOSIS — Z452 Encounter for adjustment and management of vascular access device: Secondary | ICD-10-CM | POA: Diagnosis not present

## 2022-01-15 DIAGNOSIS — H532 Diplopia: Secondary | ICD-10-CM | POA: Insufficient documentation

## 2022-01-15 DIAGNOSIS — Z5111 Encounter for antineoplastic chemotherapy: Secondary | ICD-10-CM | POA: Insufficient documentation

## 2022-01-15 DIAGNOSIS — G629 Polyneuropathy, unspecified: Secondary | ICD-10-CM | POA: Insufficient documentation

## 2022-01-15 LAB — CBC WITH DIFFERENTIAL (CANCER CENTER ONLY)
Abs Immature Granulocytes: 0.01 10*3/uL (ref 0.00–0.07)
Basophils Absolute: 0.1 10*3/uL (ref 0.0–0.1)
Basophils Relative: 1 %
Eosinophils Absolute: 0.3 10*3/uL (ref 0.0–0.5)
Eosinophils Relative: 6 %
HCT: 44.9 % (ref 39.0–52.0)
Hemoglobin: 15.1 g/dL (ref 13.0–17.0)
Immature Granulocytes: 0 %
Lymphocytes Relative: 38 %
Lymphs Abs: 1.9 10*3/uL (ref 0.7–4.0)
MCH: 29.4 pg (ref 26.0–34.0)
MCHC: 33.6 g/dL (ref 30.0–36.0)
MCV: 87.4 fL (ref 80.0–100.0)
Monocytes Absolute: 0.6 10*3/uL (ref 0.1–1.0)
Monocytes Relative: 13 %
Neutro Abs: 2.1 10*3/uL (ref 1.7–7.7)
Neutrophils Relative %: 42 %
Platelet Count: 135 10*3/uL — ABNORMAL LOW (ref 150–400)
RBC: 5.14 MIL/uL (ref 4.22–5.81)
RDW: 14.6 % (ref 11.5–15.5)
WBC Count: 4.9 10*3/uL (ref 4.0–10.5)
nRBC: 0 % (ref 0.0–0.2)

## 2022-01-15 LAB — CMP (CANCER CENTER ONLY)
ALT: 33 U/L (ref 0–44)
AST: 30 U/L (ref 15–41)
Albumin: 4.2 g/dL (ref 3.5–5.0)
Alkaline Phosphatase: 61 U/L (ref 38–126)
Anion gap: 10 (ref 5–15)
BUN: 13 mg/dL (ref 6–20)
CO2: 26 mmol/L (ref 22–32)
Calcium: 9.4 mg/dL (ref 8.9–10.3)
Chloride: 103 mmol/L (ref 98–111)
Creatinine: 0.83 mg/dL (ref 0.61–1.24)
GFR, Estimated: >60 mL/min (ref >60)
Glucose, Bld: 104 mg/dL — ABNORMAL HIGH (ref 70–99)
Potassium: 3.9 mmol/L (ref 3.5–5.1)
Sodium: 139 mmol/L (ref 135–145)
Total Bilirubin: 0.5 mg/dL (ref 0.3–1.2)
Total Protein: 6.7 g/dL (ref 6.5–8.1)

## 2022-01-15 MED ORDER — OXALIPLATIN CHEMO INJECTION 100 MG/20ML
84.0000 mg/m2 | Freq: Once | INTRAVENOUS | Status: AC
  Administered 2022-01-15: 200 mg via INTRAVENOUS
  Filled 2022-01-15: qty 40

## 2022-01-15 MED ORDER — SODIUM CHLORIDE 0.9 % IV SOLN
2400.0000 mg/m2 | INTRAVENOUS | Status: DC
  Administered 2022-01-15: 5800 mg via INTRAVENOUS
  Filled 2022-01-15: qty 20

## 2022-01-15 MED ORDER — SODIUM CHLORIDE 0.9 % IV SOLN
10.0000 mg | Freq: Once | INTRAVENOUS | Status: AC
  Administered 2022-01-15: 10 mg via INTRAVENOUS
  Filled 2022-01-15: qty 10

## 2022-01-15 MED ORDER — DEXTROSE 5 % IV SOLN: Freq: Once | INTRAVENOUS | Status: AC

## 2022-01-15 MED ORDER — PALONOSETRON HCL INJECTION 0.25 MG/5ML
0.2500 mg | Freq: Once | INTRAVENOUS | Status: AC
  Administered 2022-01-15: 0.25 mg via INTRAVENOUS
  Filled 2022-01-15: qty 5

## 2022-01-15 MED ORDER — LEUCOVORIN CALCIUM INJECTION 350 MG
400.0000 mg/m2 | Freq: Once | INTRAVENOUS | Status: AC
  Administered 2022-01-15: 964 mg via INTRAVENOUS
  Filled 2022-01-15: qty 48.2

## 2022-01-15 MED ORDER — FLUOROURACIL CHEMO INJECTION 2.5 GM/50ML
400.0000 mg/m2 | Freq: Once | INTRAVENOUS | Status: AC
  Administered 2022-01-15: 950 mg via INTRAVENOUS
  Filled 2022-01-15: qty 19

## 2022-01-15 NOTE — Patient Instructions (Signed)
Billy Roman   Discharge Instructions: Thank you for choosing [facility] to provide your oncology and hematology care.   If you have a lab appointment with the [facility], please go directly to the [facility] and check in at the registration area.   Wear comfortable clothing and clothing appropriate for easy access to any Portacath or PICC line.   We strive to give you quality time with your provider. You may need to reschedule your appointment if you arrive late (15 or more minutes).  Arriving late affects you and other patients whose appointments are after yours.  Also, if you miss three or more appointments without notifying the office, you may be dismissed from the clinic at the provider's discretion.      For prescription refill requests, have your pharmacy contact our office and allow 72 hours for refills to be completed.    Today you received the following chemotherapy and/or immunotherapy agents Oxaliplatin (ELOXATIN), Leucovorin & Flourouracil (ADRUCIL).      To help prevent nausea and vomiting after your treatment, we encourage you to take your nausea medication as directed.  BELOW ARE SYMPTOMS THAT SHOULD BE REPORTED IMMEDIATELY: *FEVER GREATER THAN 100.4 F (38 C) OR HIGHER *CHILLS OR SWEATING *NAUSEA AND VOMITING THAT IS NOT CONTROLLED WITH YOUR NAUSEA MEDICATION *UNUSUAL SHORTNESS OF BREATH *UNUSUAL BRUISING OR BLEEDING *URINARY PROBLEMS (pain or burning when urinating, or frequent urination) *BOWEL PROBLEMS (unusual diarrhea, constipation, pain near the anus) TENDERNESS IN MOUTH AND THROAT WITH OR WITHOUT PRESENCE OF ULCERS (sore throat, sores in mouth, or a toothache) UNUSUAL RASH, SWELLING OR PAIN  UNUSUAL VAGINAL DISCHARGE OR ITCHING   Items with * indicate a potential emergency and should be followed up as soon as possible or go to the Emergency Department if any problems should occur.  Please show the CHEMOTHERAPY ALERT  CARD or IMMUNOTHERAPY ALERT CARD at check-in to the Emergency Department and triage nurse.  Should you have questions after your visit or need to cancel or reschedule your appointment, please contact [facility]  Dept: [phone]  and follow the prompts.  Office hours are 8:00 a.m. to 4:30 p.m. Monday - Friday. Please note that voicemails left after 4:00 p.m. may not be returned until the following business day.  We are closed weekends and major holidays. You have access to a nurse at all times for urgent questions. Please call the main number to the clinic Dept: [phone] and follow the prompts.   For any non-urgent questions, you may also contact your provider using MyChart. We now offer e-Visits for anyone 73 and older to request care online for non-urgent symptoms. For details visit [email or web address].   Also download the MyChart app! Go to the app store, search "MyChart", open the app, select [facility], and log in with your MyChart username and password.  Masks are optional in the cancer centers. If you would like for your care team to wear a mask while they are taking care of you, please let them know. You may have one support person who is at least 59 years old accompany you for your appointments.  Oxaliplatin Injection What is this medication? OXALIPLATIN (ox AL i PLA tin) is a chemotherapy drug. It targets fast dividing cells, like cancer cells, and causes these cells to die. This medicine is used to treat cancers of the colon and rectum, and many other cancers. This medicine may be used for other purposes; ask  your health care provider or pharmacist if you have questions. COMMON BRAND NAME(S): Eloxatin What should I tell my care team before I take this medication? They need to know if you have any of these conditions: heart disease history of irregular heartbeat liver disease low blood counts, like white cells, platelets, or red blood cells lung  or breathing disease, like asthma take medicines that treat or prevent blood clots tingling of the fingers or toes, or other nerve disorder an unusual or allergic reaction to oxaliplatin, other chemotherapy, other medicines, foods, dyes, or preservatives pregnant or trying to get pregnant breast-feeding How should I use this medication? This drug is given as an infusion into a vein. It is administered in a hospital or clinic by a specially trained health care professional. Talk to your pediatrician regarding the use of this medicine in children. Special care may be needed. Overdosage: If you think you have taken too much of this medicine contact a poison control center or emergency room at once. NOTE: This medicine is only for you. Do not share this medicine with others. What if I miss a dose? It is important not to miss a dose. Call your doctor or health care professional if you are unable to keep an appointment. What may interact with this medication? Do not take this medicine with any of the following medications: cisapride dronedarone pimozide thioridazine This medicine may also interact with the following medications: aspirin and aspirin-like medicines certain medicines that treat or prevent blood clots like warfarin, apixaban, dabigatran, and rivaroxaban cisplatin cyclosporine diuretics medicines for infection like acyclovir, adefovir, amphotericin B, bacitracin, cidofovir, foscarnet, ganciclovir, gentamicin, pentamidine, vancomycin NSAIDs, medicines for pain and inflammation, like ibuprofen or naproxen other medicines that prolong the QT interval (an abnormal heart rhythm) pamidronate zoledronic acid This list may not describe all possible interactions. Give your health care provider a list of all the medicines, herbs, non-prescription drugs, or dietary supplements you use. Also tell them if you smoke, drink alcohol, or use illegal drugs. Some items may interact with your  medicine. What should I watch for while using this medication? Your condition will be monitored carefully while you are receiving this medicine. You may need blood work done while you are taking this medicine. This medicine may make you feel generally unwell. This is not uncommon as chemotherapy can affect healthy cells as well as cancer cells. Report any side effects. Continue your course of treatment even though you feel ill unless your healthcare professional tells you to stop. This medicine can make you more sensitive to cold. Do not drink cold drinks or use ice. Cover exposed skin before coming in contact with cold temperatures or cold objects. When out in cold weather wear warm clothing and cover your mouth and nose to warm the air that goes into your lungs. Tell your doctor if you get sensitive to the cold. Do not become pregnant while taking this medicine or for 9 months after stopping it. Women should inform their health care professional if they wish to become pregnant or think they might be pregnant. Men should not father a child while taking this medicine and for 6 months after stopping it. There is potential for serious side effects to an unborn child. Talk to your health care professional for more information. Do not breast-feed a child while taking this medicine or for 3 months after stopping it. This medicine has caused ovarian failure in some women. This medicine may make it more difficult to get pregnant.  Talk to your health care professional if you are concerned about your fertility. This medicine has caused decreased sperm counts in some men. This may make it more difficult to father a child. Talk to your health care professional if you are concerned about your fertility. This medicine may increase your risk of getting an infection. Call your health care professional for advice if you get a fever, chills, or sore throat, or other symptoms of a cold or flu. Do not treat yourself. Try to  avoid being around people who are sick. Avoid taking medicines that contain aspirin, acetaminophen, ibuprofen, naproxen, or ketoprofen unless instructed by your health care professional. These medicines may hide a fever. Be careful brushing or flossing your teeth or using a toothpick because you may get an infection or bleed more easily. If you have any dental work done, tell your dentist you are receiving this medicine. What side effects may I notice from receiving this medication? Side effects that you should report to your doctor or health care professional as soon as possible: allergic reactions like skin rash, itching or hives, swelling of the face, lips, or tongue breathing problems cough low blood counts - this medicine may decrease the number of white blood cells, red blood cells, and platelets. You may be at increased risk for infections and bleeding nausea, vomiting pain, redness, or irritation at site where injected pain, tingling, numbness in the hands or feet signs and symptoms of bleeding such as bloody or black, tarry stools; red or dark brown urine; spitting up blood or brown material that looks like coffee grounds; red spots on the skin; unusual bruising or bleeding from the eyes, gums, or nose signs and symptoms of a dangerous change in heartbeat or heart rhythm like chest pain; dizziness; fast, irregular heartbeat; palpitations; feeling faint or lightheaded; falls signs and symptoms of infection like fever; chills; cough; sore throat; pain or trouble passing urine signs and symptoms of liver injury like dark yellow or brown urine; general ill feeling or flu-like symptoms; light-colored stools; loss of appetite; nausea; right upper belly pain; unusually weak or tired; yellowing of the eyes or skin signs and symptoms of low red blood cells or anemia such as unusually weak or tired; feeling faint or lightheaded; falls signs and symptoms of muscle injury like dark urine; trouble  passing urine or change in the amount of urine; unusually weak or tired; muscle pain; back pain Side effects that usually do not require medical attention (report to your doctor or health care professional if they continue or are bothersome): changes in taste diarrhea gas hair loss loss of appetite mouth sores This list may not describe all possible side effects. Call your doctor for medical advice about side effects. You may report side effects to FDA at 1-800-FDA-1088. Where should I keep my medication? This drug is given in a hospital or clinic and will not be stored at home. NOTE: This sheet is a summary. It may not cover all possible information. If you have questions about this medicine, talk to your doctor, pharmacist, or health care provider.  2023 Elsevier/Gold Standard (2021-05-04 00:00:00)  Leucovorin injection What is this medication? LEUCOVORIN (loo koe VOR in) is used to prevent or treat the harmful effects of some medicines. This medicine is used to treat anemia caused by a low amount of folic acid in the body. It is also used with 5-fluorouracil (5-FU) to treat colon cancer. This medicine may be used for other purposes; ask your health  care provider or pharmacist if you have questions. What should I tell my care team before I take this medication? They need to know if you have any of these conditions: anemia from low levels of vitamin B-12 in the blood an unusual or allergic reaction to leucovorin, folic acid, other medicines, foods, dyes, or preservatives pregnant or trying to get pregnant breast-feeding How should I use this medication? This medicine is for injection into a muscle or into a vein. It is given by a health care professional in a hospital or clinic setting. Talk to your pediatrician regarding the use of this medicine in children. Special care may be needed. Overdosage: If you think you have taken too much of this medicine contact a poison control center or  emergency room at once. NOTE: This medicine is only for you. Do not share this medicine with others. What if I miss a dose? This does not apply. What may interact with this medication? capecitabine fluorouracil phenobarbital phenytoin primidone trimethoprim-sulfamethoxazole This list may not describe all possible interactions. Give your health care provider a list of all the medicines, herbs, non-prescription drugs, or dietary supplements you use. Also tell them if you smoke, drink alcohol, or use illegal drugs. Some items may interact with your medicine. What should I watch for while using this medication? Your condition will be monitored carefully while you are receiving this medicine. This medicine may increase the side effects of 5-fluorouracil, 5-FU. Tell your doctor or health care professional if you have diarrhea or mouth sores that do not get better or that get worse. What side effects may I notice from receiving this medication? Side effects that you should report to your doctor or health care professional as soon as possible: allergic reactions like skin rash, itching or hives, swelling of the face, lips, or tongue breathing problems fever, infection mouth sores unusual bleeding or bruising unusually weak or tired Side effects that usually do not require medical attention (report to your doctor or health care professional if they continue or are bothersome): constipation or diarrhea loss of appetite nausea, vomiting This list may not describe all possible side effects. Call your doctor for medical advice about side effects. You may report side effects to FDA at 1-800-FDA-1088. Where should I keep my medication? This drug is given in a hospital or clinic and will not be stored at home. NOTE: This sheet is a summary. It may not cover all possible information. If you have questions about this medicine, talk to your doctor, pharmacist, or health care provider.  2023  Elsevier/Gold Standard (2007-12-10 00:00:00)  Fluorouracil, 5-FU injection What is this medication? FLUOROURACIL, 5-FU (flure oh YOOR a sil) is a chemotherapy drug. It slows the growth of cancer cells. This medicine is used to treat many types of cancer like breast cancer, colon or rectal cancer, pancreatic cancer, and stomach cancer. This medicine may be used for other purposes; ask your health care provider or pharmacist if you have questions. COMMON BRAND NAME(S): Adrucil What should I tell my care team before I take this medication? They need to know if you have any of these conditions: blood disorders dihydropyrimidine dehydrogenase (DPD) deficiency infection (especially a virus infection such as chickenpox, cold sores, or herpes) kidney disease liver disease malnourished, poor nutrition recent or ongoing radiation therapy an unusual or allergic reaction to fluorouracil, other chemotherapy, other medicines, foods, dyes, or preservatives pregnant or trying to get pregnant breast-feeding How should I use this medication? This drug is given as  an infusion or injection into a vein. It is administered in a hospital or clinic by a specially trained health care professional. Talk to your pediatrician regarding the use of this medicine in children. Special care may be needed. Overdosage: If you think you have taken too much of this medicine contact a poison control center or emergency room at once. NOTE: This medicine is only for you. Do not share this medicine with others. What if I miss a dose? It is important not to miss your dose. Call your doctor or health care professional if you are unable to keep an appointment. What may interact with this medication? Do not take this medicine with any of the following medications: live virus vaccines This medicine may also interact with the following medications: medicines that treat or prevent blood clots like warfarin, enoxaparin, and  dalteparin This list may not describe all possible interactions. Give your health care provider a list of all the medicines, herbs, non-prescription drugs, or dietary supplements you use. Also tell them if you smoke, drink alcohol, or use illegal drugs. Some items may interact with your medicine. What should I watch for while using this medication? Visit your doctor for checks on your progress. This drug may make you feel generally unwell. This is not uncommon, as chemotherapy can affect healthy cells as well as cancer cells. Report any side effects. Continue your course of treatment even though you feel ill unless your doctor tells you to stop. In some cases, you may be given additional medicines to help with side effects. Follow all directions for their use. Call your doctor or health care professional for advice if you get a fever, chills or sore throat, or other symptoms of a cold or flu. Do not treat yourself. This drug decreases your body's ability to fight infections. Try to avoid being around people who are sick. This medicine may increase your risk to bruise or bleed. Call your doctor or health care professional if you notice any unusual bleeding. Be careful brushing and flossing your teeth or using a toothpick because you may get an infection or bleed more easily. If you have any dental work done, tell your dentist you are receiving this medicine. Avoid taking products that contain aspirin, acetaminophen, ibuprofen, naproxen, or ketoprofen unless instructed by your doctor. These medicines may hide a fever. Do not become pregnant while taking this medicine. Women should inform their doctor if they wish to become pregnant or think they might be pregnant. There is a potential for serious side effects to an unborn child. Talk to your health care professional or pharmacist for more information. Do not breast-feed an infant while taking this medicine. Men should inform their doctor if they wish to  father a child. This medicine may lower sperm counts. Do not treat diarrhea with over the counter products. Contact your doctor if you have diarrhea that lasts more than 2 days or if it is severe and watery. This medicine can make you more sensitive to the sun. Keep out of the sun. If you cannot avoid being in the sun, wear protective clothing and use sunscreen. Do not use sun lamps or tanning beds/booths. What side effects may I notice from receiving this medication? Side effects that you should report to your doctor or health care professional as soon as possible: allergic reactions like skin rash, itching or hives, swelling of the face, lips, or tongue low blood counts - this medicine may decrease the number of white blood cells,  red blood cells and platelets. You may be at increased risk for infections and bleeding. signs of infection - fever or chills, cough, sore throat, pain or difficulty passing urine signs of decreased platelets or bleeding - bruising, pinpoint red spots on the skin, black, tarry stools, blood in the urine signs of decreased red blood cells - unusually weak or tired, fainting spells, lightheadedness breathing problems changes in vision chest pain mouth sores nausea and vomiting pain, swelling, redness at site where injected pain, tingling, numbness in the hands or feet redness, swelling, or sores on hands or feet stomach pain unusual bleeding Side effects that usually do not require medical attention (report to your doctor or health care professional if they continue or are bothersome): changes in finger or toe nails diarrhea dry or itchy skin hair loss headache loss of appetite sensitivity of eyes to the light stomach upset unusually teary eyes This list may not describe all possible side effects. Call your doctor for medical advice about side effects. You may report side effects to FDA at 1-800-FDA-1088. Where should I keep my medication? This drug is given  in a hospital or clinic and will not be stored at home. NOTE: This sheet is a summary. It may not cover all possible information. If you have questions about this medicine, talk to your doctor, pharmacist, or health care provider.  2023 Elsevier/Gold Standard (2021-05-04 00:00:00)  The chemotherapy medication bag should finish at 46 hours, 96 hours, or 7 days. For example, if your pump is scheduled for 46 hours and it was put on at 4:00 p.m., it should finish at 2:00 p.m. the day it is scheduled to come off regardless of your appointment time.     Estimated time to finish at 12:15 p.m. on Thursday 01/17/2022.   If the display on your pump reads "Low Volume" and it is beeping, take the batteries out of the pump and come to the cancer center for it to be taken off.   If the pump alarms go off prior to the pump reading "Low Volume" then call [phone] and someone can assist you.  If the plunger comes out and the chemotherapy medication is leaking out, please use your home chemo spill kit to clean up the spill. Do NOT use paper towels or other household products.  If you have problems or questions regarding your pump, please call either [phone] (24 hours a day) or the cancer center Monday-Friday 8:00 a.m.- 4:30 p.m. at the clinic number and we will assist you. If you are unable to get assistance, then go to the nearest Emergency Department and ask the staff to contact the IV team for assistance. \

## 2022-01-15 NOTE — Progress Notes (Signed)
Patient seen by Lisa Thomas NP today  Vitals are within treatment parameters.  Labs reviewed by Lisa Thomas NP and are within treatment parameters.  Per physician team, patient is ready for treatment and there are NO modifications to the treatment plan.     

## 2022-01-15 NOTE — Progress Notes (Signed)
  [facility] OFFICE PROGRESS NOTE   Diagnosis: Rectal cancer  INTERVAL HISTORY:   Billy Roman returns as scheduled.  He completed cycle 2 FOLFOX 01/02/2022.  He noted much less nausea with prophylactic dexamethasone.  He has mild nausea at present and wonders if this is related to a bee or wasp sting earlier this morning.  As far as he knows he is not allergic to insect stings.  No mouth sores.  No diarrhea.  He did develop constipation and took MiraLAX with good results.  He has mild persistent cold sensitivity in the fingertips.  Objective:  Vital signs in last 24 hours:  Blood pressure (!) 150/84, pulse 81, temperature 98.1 F (36.7 C), temperature source Oral, resp. rate 18, height '6\' 1"'$  (1.854 m), weight 254 lb 9.6 oz (115.5 kg), SpO2 99 %.    HEENT: No thrush or ulcers. Resp: Distant breath sounds.  No respiratory distress. Cardio: Regular rate and rhythm. GI: Abdomen soft and nontender.  No hepatosplenomegaly.   Vascular: No leg edema. Neuro: Vibratory sense intact over the fingertips per tuning fork exam. Skin: Palms without erythema.  Right upper lateral back with small rounded area of erythema. Port-A-Cath without erythema.  Lab Results:  Lab Results  Component Value Date   WBC 4.9 01/15/2022   HGB 15.1 01/15/2022   HCT 44.9 01/15/2022   MCV 87.4 01/15/2022   PLT 135 (L) 01/15/2022   NEUTROABS 2.1 01/15/2022    Imaging:  No results found.  Medications: I have reviewed the patient's current medications.  Assessment/Plan: Adenocarcinoma of the proximal rectum/distal sigmoid Colonoscopy 11/19/2021-partially obstructing mass at 12-19 cm, biopsy moderate to poorly differentiated adenocarcinoma Mildly elevated CEA CTs 11/29/2021-mural thickening in the distal sigmoid/proximal rectum with haziness of the mesorectal fat, mesorectal lymphadenopathy, small pulmonary nodules favored to be benign MRI pelvis 12/03/2021-tumor at 10 cm from the anal verge,  T3c, approximately 10 metastatic nodes in the mesorectal sheath and presacral space, no extra mesorectal lymphadenopathy, N2 Cycle 1 FOLFOX 12/19/2021 Cycle 2 FOLFOX 01/02/2022, home Decadron prophylaxis added for delayed nausea Cycle 3 FOLFOX 01/15/2022 Major depressive disorder Change in bowel habits and rectal bleeding secondary to #1 Ongoing tobacco use Report of blurred vision and diplopia Peripheral neuropathy-fingers and left foot  Disposition: Billy Roman appears stable.  He has completed 2 cycles of FOLFOX.  He noted improved tolerance with significantly less nausea following cycle 2.  Plan to proceed with cycle 3 today as scheduled.    We reviewed the overall treatment plan consisting of 8 cycles of FOLFOX chemotherapy followed by radiation/Xeloda then surgery.  Referral placed to Dr. Lisbeth Renshaw.  CBC and chemistry panel reviewed.  Labs adequate to proceed with treatment today as scheduled.  He will return for lab, follow-up, cycle 4 FOLFOX in 2 weeks.  We are available to see him sooner if needed.    Ned Card ANP/GNP-BC   01/15/2022  9:49 AM

## 2022-01-15 NOTE — Telephone Encounter (Signed)
Spoke to pt who asked to be called tomorrow due to being in chemo. Wilkl call to schedule 8/2

## 2022-01-15 NOTE — Progress Notes (Signed)
[place] Work  Initial Assessment   NAOMI FITTON is a 61 y.o. year old male presenting alone. Patient is a widower and cared for his wife who had MS.  Clinical Social Work was referred by nurse navigator for assessment of psychosocial needs.   SDOH (Social Determinants of Health) assessments performed: Yes SDOH Interventions    Flowsheet Row Most Recent Value  SDOH Interventions   Financial Strain Interventions Financial Counselor  Transportation Interventions Intervention Not Indicated       SDOH Screenings   Alcohol Screen: Low Risk  (08/23/2021)   Alcohol Screen    Last Alcohol Screening Score (AUDIT): 4  Depression (PHQ2-9): Medium Risk (08/23/2021)   Depression (PHQ2-9)    PHQ-2 Score: 18  Financial Resource Strain: Medium Risk (01/15/2022)   Overall Financial Resource Strain (CARDIA)    Difficulty of Paying Living Expenses: Somewhat hard  Food Insecurity: Food Insecurity Present (01/15/2022)   Hunger Vital Sign    Worried About Running Out of Food in the Last Year: Sometimes true    Ran Out of Food in the Last Year: Sometimes true  Housing: Not on file  Physical Activity: Not on file  Social Connections: Not on file  Stress: Not on file  Tobacco Use: High Risk (01/15/2022)   Patient History    Smoking Tobacco Use: Every Day    Smokeless Tobacco Use: Former    Passive Exposure: Never  Transportation Needs: No Transportation Needs (01/15/2022)   PRAPARE - Hydrologist (Medical): No    Lack of Transportation (Non-Medical): No     Distress Screen completed: No     No data to display            Family/Social Information:  Housing Arrangement: patient lives alone.  His daughter and other family members help him when needed. Family members/support persons in your life? Family Transportation concerns: no  Employment: Disabled.  Encouraged patient to contact Human Resources at his work regarding his questions with long term  disability.  Income source: Short-Term Disability Financial concerns: Yes, due to illness and/or loss of work during treatment Type of concern: Food Food access concerns: yes Religious or spiritual practice: No Services Currently in place:  None  Coping/ Adjustment to diagnosis: Patient understands treatment plan and what happens next? 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 Patient enjoys time with family/ friends Current coping skills/ strengths: Average or above average intelligence , Capable of independent living , Communication skills , and Supportive family/friends     SUMMARY: Current SDOH Barriers:  Financial constraints related to being on short term disability.  Clinical Social Work Clinical Goal(s):  Freight forwarder options for unmet needs related to:  Financial Strain   Interventions: Discussed common feeling and emotions when being diagnosed with cancer, and the importance of support during treatment Informed patient of the support team roles and support services at [facility] Provided [place] contact information and encouraged patient to call with any questions or concerns Referred patient to Otilio Carpen, Development worker, community.   Also provided [place] to assist with food.  Also discussed [employer], but patient reports he may not qualify.   Follow Up Plan: Patient will contact CSW with any support or resource needs Patient verbalizes understanding of plan: Yes    Rodman Pickle Azlan Hanway, LCSW

## 2022-01-17 ENCOUNTER — Other Ambulatory Visit: Payer: Self-pay | Admitting: *Deleted

## 2022-01-17 ENCOUNTER — Inpatient Hospital Stay: Payer: BC Managed Care – PPO

## 2022-01-17 VITALS — BP 122/78 | HR 77 | Temp 98.7°F | Resp 20

## 2022-01-17 DIAGNOSIS — Z5111 Encounter for antineoplastic chemotherapy: Secondary | ICD-10-CM | POA: Diagnosis not present

## 2022-01-17 DIAGNOSIS — R197 Diarrhea, unspecified: Secondary | ICD-10-CM | POA: Diagnosis not present

## 2022-01-17 DIAGNOSIS — F329 Major depressive disorder, single episode, unspecified: Secondary | ICD-10-CM | POA: Diagnosis not present

## 2022-01-17 DIAGNOSIS — Z452 Encounter for adjustment and management of vascular access device: Secondary | ICD-10-CM | POA: Diagnosis not present

## 2022-01-17 DIAGNOSIS — G629 Polyneuropathy, unspecified: Secondary | ICD-10-CM | POA: Diagnosis not present

## 2022-01-17 DIAGNOSIS — R21 Rash and other nonspecific skin eruption: Secondary | ICD-10-CM | POA: Diagnosis not present

## 2022-01-17 DIAGNOSIS — C20 Malignant neoplasm of rectum: Secondary | ICD-10-CM | POA: Diagnosis not present

## 2022-01-17 DIAGNOSIS — H532 Diplopia: Secondary | ICD-10-CM | POA: Diagnosis not present

## 2022-01-17 MED ORDER — DEXAMETHASONE 4 MG PO TABS: ORAL_TABLET | ORAL | 0 refills | Status: DC

## 2022-01-17 MED ORDER — HEPARIN SOD (PORK) LOCK FLUSH 100 UNIT/ML IV SOLN: 500.0000 [IU] | Freq: Once | INTRAVENOUS | Status: DC | PRN

## 2022-01-17 MED ORDER — SODIUM CHLORIDE 0.9% FLUSH: 10.0000 mL | INTRAVENOUS | Status: DC | PRN

## 2022-01-17 NOTE — Patient Instructions (Signed)
Heparin injection What is this medication? HEPARIN (HEP a rin) is an anticoagulant. It is used to treat or prevent clots in the veins, arteries, lungs, or heart. It stops clots from forming or getting bigger. This medicine prevents clotting during open-heart surgery, dialysis, or in patients who are confined to bed. This medicine may be used for other purposes; ask your health care provider or pharmacist if you have questions. COMMON BRAND NAME(S): Hep-Lock, Hep-Lock U/P, Hepflush-10, Monoject Prefill Advanced Heparin Lock Flush, SASH Normal Saline and Heparin What should I tell my care team before I take this medication? They need to know if you have any of these conditions: bleeding disorders, such as hemophilia or low blood platelets bowel disease or diverticulitis endocarditis high blood pressure liver disease recent surgery or delivery of a baby stomach ulcers an unusual or allergic reaction to heparin, benzyl alcohol, sulfites, other medicines, foods, dyes, or preservatives pregnant or trying to get pregnant breast-feeding How should I use this medication? This medicine is given by injection or infusion into a vein. It can also be given by injection of small amounts under the skin. It is usually given by a health care professional in a hospital or clinic setting. If you get this medicine at home, you will be taught how to prepare and give this medicine. Use exactly as directed. Take your medicine at regular intervals. Do not take it more often than directed. Do not stop taking except on your doctor's advice. Stopping this medicine may increase your risk of a blot clot. Be sure to refill your prescription before you run out of medicine. It is important that you put your used needles and syringes in a special sharps container. Do not put them in a trash can. If you do not have a sharps container, call your pharmacist or healthcare provider to get one. Talk to your pediatrician regarding the  use of this medicine in children. While this medicine may be prescribed for children for selected conditions, precautions do apply. Overdosage: If you think you have taken too much of this medicine contact a poison control center or emergency room at once. NOTE: This medicine is only for you. Do not share this medicine with others. What if I miss a dose? If you miss a dose, take it as soon as you can. If it is almost time for your next dose, take only that dose. Do not take double or extra doses. What may interact with this medication? Do not take this medicine with any of the following medications: aspirin and aspirin-like drugs mifepristone medicines that treat or prevent blood clots like warfarin, enoxaparin, and dalteparin palifermin protamine This medicine may also interact with the following medications: dextran digoxin hydroxychloroquine medicines for treating colds or allergies nicotine NSAIDs, medicines for pain and inflammation, like ibuprofen or naproxen phenylbutazone tetracycline antibiotics This list may not describe all possible interactions. Give your health care provider a list of all the medicines, herbs, non-prescription drugs, or dietary supplements you use. Also tell them if you smoke, drink alcohol, or use illegal drugs. Some items may interact with your medicine. What should I watch for while using this medication? Visit your healthcare professional for regular checks on your progress. You may need blood work done while you are taking this medicine. Your condition will be monitored carefully while you are receiving this medicine. It is important not to miss any appointments. Wear a medical ID bracelet or chain, and carry a card that describes your disease and details   of your medicine and dosage times. Notify your doctor or healthcare professional at once if you have cold, blue hands or feet. If you are going to need surgery or other procedure, tell your healthcare  professional that you are using this medicine. Avoid sports and activities that might cause injury while you are using this medicine. Severe falls or injuries can cause unseen bleeding. Be careful when using sharp tools or knives. Consider using an electric razor. Take special care brushing or flossing your teeth. Report any injuries, bruising, or red spots on the skin to your healthcare professional. Using this medicine for a long time may weaken your bones and increase the risk of bone fractures. You should make sure that you get enough calcium and vitamin D while you are taking this medicine. Discuss the foods you eat and the vitamins you take with your healthcare professional. Wear a medical ID bracelet or chain. Carry a card that describes your disease and details of your medicine and dosage times. What side effects may I notice from receiving this medication? Side effects that you should report to your doctor or health care professional as soon as possible: allergic reactions like skin rash, itching or hives, swelling of the face, lips, or tongue bone pain fever, chills nausea, vomiting signs and symptoms of bleeding such as bloody or black, tarry stools; red or dark-brown urine; spitting up blood or brown material that looks like coffee grounds; red spots on the skin; unusual bruising or bleeding from the eye, gums, or nose signs and symptoms of a blood clot such as chest pain; shortness of breath; pain, swelling, or warmth in the leg signs and symptoms of a stroke such as changes in vision; confusion; trouble speaking or understanding; severe headaches; sudden numbness or weakness of the face, arm or leg; trouble walking; dizziness; loss of coordination Side effects that usually do not require medical attention (report to your doctor or health care professional if they continue or are bothersome): hair loss pain, redness, or irritation at site where injected This list may not describe all  possible side effects. Call your doctor for medical advice about side effects. You may report side effects to FDA at 1-800-FDA-1088. Where should I keep my medication? Keep out of the reach of children. Store unopened vials at room temperature between 15 and 30 degrees C (59 and 86 degrees F). Do not freeze. Do not use if solution is discolored or particulate matter is present. Throw away any unused medicine after the expiration date. NOTE: This sheet is a summary. It may not cover all possible information. If you have questions about this medicine, talk to your doctor, pharmacist, or health care provider.  2023 Elsevier/Gold Standard (2005-03-11 00:00:00)  

## 2022-01-17 NOTE — Progress Notes (Signed)
Patient requesting the dexamethasone for 2 days post tx for nausea again. OK per NP.

## 2022-01-19 DIAGNOSIS — C20 Malignant neoplasm of rectum: Secondary | ICD-10-CM | POA: Diagnosis not present

## 2022-01-21 NOTE — Progress Notes (Signed)
GI Location of Tumor / Histology: Rectal Cancer  Billy Roman presented with abdominal discomfort, fatigue, and dark stools since the spring of 2022.  MRI Pelvis 12/03/2021:  extension of his tumor into the muscularis and through it.  The length of his tumor was 8.3 cm and was 10 cm from the anal verge.  His tumor was staged as a T3c, and nodal staging was N2 based on 10 metastatic nodes in the mesorectal sheath several nodes in his presacral space  CT CAP 11/30/2021: extensive mural thickening of the distal sigmoid and proximal rectum.  Small pulmonary nodules measuring 5 mm or less in the lungs were identified and no evidence of metastatic disease to the abdomen was noted.   Colonoscopy 11/19/2021: Showed a partially obstructing tumor in the rectum approximately 12 to 19 cm from the dentate line.  2 additional polyps in the sigmoid and ascending colon were identified.      Biopsies of Rectal Mass 11/19/2021     Past/Anticipated interventions by surgeon, if any:  Dr. Dema Severin 12/17/2021 -It is not clear whether the tumor starts in the distal sigmoid colon versus proximal rectum.  -Case presented at our multidisciplinary tumor board and consensus recommendation was for TNT. We have been asked by Dr. Benay Spice to place port-a-cath for planned chemotherapy administration.  Past/Anticipated interventions by medical oncology, if any:  Dr. Benay Spice 11/30/2021 -  I recommend proceeding with total neoadjuvant therapy.  His case will be presented at the GI tumor conference on 12/12/2021. -FOLFOX start 12/19/2021   Weight changes, if any:   Bowel/Bladder complaints, if any:   Nausea / Vomiting, if any:   Pain issues, if any:    Any blood per rectum:     SAFETY ISSUES: Prior radiation?  Pacemaker/ICD?  Possible current pregnancy? N/a Is the patient on methotrexate?   Current Complaints/Details:

## 2022-01-21 NOTE — Progress Notes (Signed)
Radiation Oncology         [phone] ________________________________  Name: Billy Roman        MRN: [id number]  Date of Service: 01/22/2022 DOB: 08-27-1960  FB:PZWCHENI, Flonnie Hailstone, NP  Ladell Pier, MD     REFERRING PHYSICIAN: Ladell Pier, MD   DIAGNOSIS: The encounter diagnosis was Rectal cancer [facility]).   HISTORY OF PRESENT ILLNESS: Billy Roman is a 61 y.o. male seen at the request of Dr. Benay Spice for diagnosis of rectal cancer.  The patient was found to have changes in stool habits and was seen by GI.  He underwent colonoscopy with Dr. Arelia Longest on 11/19/2021 which showed a partially obstructing tumor in the rectum approximately 12 to 19 cm from the dentate line.  2 additional polyps in the sigmoid and ascending colon were identified.  Evidence of external and internal hemorrhoids and diverticulosis were also noted.  Biopsies from that procedure showed tubular adenomatous changes of the sigmoid and ascending colon polyps.  His rectal mass however showed moderate to poorly differentiated adenocarcinoma.  He underwent staging imaging.  CT chest abdomen and pelvis showed extensive mural thickening of the distal sigmoid and proximal rectum.  Small pulmonary nodules measuring 5 mm or less in the lungs were identified and no evidence of metastatic disease to the abdomen was noted.  He underwent an MRI of the pelvis with and without contrast on 12/03/2021 which showed extension of his tumor into the muscularis and through it.  The length of his tumor was 8.3 cm and was 10 cm from the anal verge.  His tumor was staged as a T3c, and nodal staging was in 2 based on 10 metastatic nodes in the mesorectal sheath several nodes in his presacral space and given these findings he began total neoadjuvant FOLFOX with Dr. Benay Spice on 12/19/2021.  He is seen to discuss chemoradiation at the appropriate time.  Of note he is a patient established with Dr. Dema Severin and he encouraged the patient to stop smoking  and discussed that he may be a candidate for permanent colostomy depending on tobacco use and response to therapy.    PREVIOUS RADIATION THERAPY: {EXAM; YES/NO:19492::"No"}   PAST MEDICAL HISTORY:  Past Medical History:  Diagnosis Date   Anxiety    Arrhythmia    Complication of anesthesia    Nausea   [name], recurrent severe, without psychosis ([place]) 08/23/2021   Pneumonia    Rectal cancer ([place]) 11/20/2021   Sleep apnea        PAST SURGICAL HISTORY: Past Surgical History:  Procedure Laterality Date   OPERATIVE ULTRASOUND Right 12/17/2021   Procedure: OPERATIVE ULTRASOUND;  Surgeon: Ileana Roup, MD;  Location: [place];  Service: General;  Laterality: Right;   PORTACATH PLACEMENT Right 12/17/2021   Procedure: INSERTION PORT-A-CATH;  Surgeon: Ileana Roup, MD;  Location: [place];  Service: General;  Laterality: Right;   RHINOPLASTY     SEPTOPLASTY     SHOULDER ARTHROSCOPY Left    SHOULDER ARTHROSCOPY WITH OPEN ROTATOR CUFF REPAIR AND DISTAL CLAVICLE ACROMINECTOMY Right 02/01/2021   Procedure: RIGHT SHOULDER ARTHROSCOPY WITH MINI OPEN ROTATOR CUFF REPAIR AND DISTAL CLAVICLE EXCISION;  Surgeon: Thornton Park, MD;  Location: [facility];  Service: Orthopedics;  Laterality: Right;   SPINE SURGERY     lumbar laminectomy   WRIST SURGERY Bilateral    corpal tunnel     FAMILY HISTORY:  Family History  Adopted: Yes  Problem Relation Age of  Onset   Colon cancer Neg Hx    Stomach cancer Neg Hx    Esophageal cancer Neg Hx      SOCIAL HISTORY:  reports that he has been smoking cigarettes. He has a 30.00 pack-year smoking history. He has never been exposed to tobacco smoke. He quit smokeless tobacco use about 33 years ago.  His smokeless tobacco use included chew. He reports current alcohol use of about 2.0 standard drinks of alcohol per week. He reports current drug use. Frequency: 1.00 time per week. Drug: Marijuana.   ALLERGIES:  Propyphenazone   MEDICATIONS:  Current Outpatient Medications  Medication Sig Dispense Refill   atomoxetine (STRATTERA) 10 MG capsule Take 10 mg by mouth daily.     dexamethasone (DECADRON) 4 MG tablet Take 4 mg twice daily x 2 days after each chemotherapy treatment starting on day 2 of each cycle 24 tablet 0   gabapentin (NEURONTIN) 300 MG capsule Take 100 mg by mouth 2 (two) times daily as needed (neuropathy).     lidocaine-prilocaine (EMLA) cream Apply 1 Application topically as needed. 30 g 0   ondansetron (ZOFRAN) 8 MG tablet Take 1 tablet (8 mg total) by mouth every 8 (eight) hours as needed for nausea or vomiting (Starting on day 4 if needed for nausea and vomiting). 20 tablet 0   prochlorperazine (COMPAZINE) 10 MG tablet Take 1 tablet (10 mg total) by mouth every 6 (six) hours as needed for nausea or vomiting. 30 tablet 0   VITAMIN D PO Take 1,000 Units by mouth once a week.     Vitamin D, Ergocalciferol, (DRISDOL) 1.25 MG (50000 UNIT) CAPS capsule Take 1 capsule (50,000 Units total) by mouth every 7 (seven) days. (Patient not taking: Reported on 12/10/2021) 5 capsule 0   No current facility-administered medications for this visit.     REVIEW OF SYSTEMS: On review of systems, the patient reports that *** is doing well overall. *** denies any chest pain, shortness of breath, cough, fevers, chills, night sweats, unintended weight changes. *** denies any bowel or bladder disturbances, and denies abdominal pain, nausea or vomiting. *** denies any new musculoskeletal or joint aches or pains. A complete review of systems is obtained and is otherwise negative.     PHYSICAL EXAM:  Wt Readings from Last 3 Encounters:  01/15/22 254 lb 9.6 oz (115.5 kg)  01/02/22 249 lb 9.6 oz (113.2 kg)  12/19/21 264 lb 6.4 oz (119.9 kg)   Temp Readings from Last 3 Encounters:  01/17/22 98.7 F (37.1 C) (Oral)  01/15/22 98.1 F (36.7 C) (Oral)  01/04/22 98.3 F (36.8 C) (Oral)   BP Readings from  Last 3 Encounters:  01/17/22 122/78  01/15/22 (!) 131/91  01/15/22 (!) 150/84   Pulse Readings from Last 3 Encounters:  01/17/22 77  01/15/22 67  01/15/22 81    /10  In general this is a well appearing *** in no acute distress. ***'s alert and oriented x4 and appropriate throughout the examination. Cardiopulmonary assessment is negative for acute distress and *** exhibits normal effort.     ECOG = ***  0 - Asymptomatic (Fully active, able to carry on all predisease activities without restriction)  1 - Symptomatic but completely ambulatory (Restricted in physically strenuous activity but ambulatory and able to carry out work of a light or sedentary nature. For example, light housework, office work)  2 - Symptomatic, <50% in bed during the day (Ambulatory and capable of all self care but unable to carry  out any work activities. Up and about more than 50% of waking hours)  3 - Symptomatic, >50% in bed, but not bedbound (Capable of only limited self-care, confined to bed or chair 50% or more of waking hours)  4 - Bedbound (Completely disabled. Cannot carry on any self-care. Totally confined to bed or chair)  5 - Death   Eustace Pen MM, Creech RH, Tormey DC, et al. [phone]). "Toxicity and response criteria of the [facility]". [place] Oncol. 5 (6): 649-55    LABORATORY DATA:  Lab Results  Component Value Date   WBC 4.9 01/15/2022   HGB 15.1 01/15/2022   HCT 44.9 01/15/2022   MCV 87.4 01/15/2022   PLT 135 (L) 01/15/2022   Lab Results  Component Value Date   NA 139 01/15/2022   K 3.9 01/15/2022   CL 103 01/15/2022   CO2 26 01/15/2022   Lab Results  Component Value Date   ALT 33 01/15/2022   AST 30 01/15/2022   ALKPHOS 61 01/15/2022   BILITOT 0.5 01/15/2022      RADIOGRAPHY: No results found.     IMPRESSION/PLAN: 1. Stage III, cT3cN2M0, moderate to poorly differentiated adenocarcinoma of the rectum. Dr. Lisbeth Renshaw discusses the pathology findings  and reviews the nature of ***.  We discussed the risks, benefits, short, and long term effects of radiotherapy, as well as the curative intent, and the patient is interested in proceeding. Dr. Lisbeth Renshaw discusses the delivery and logistics of radiotherapy and anticipates a course of 5 1/2 weeks of radiotherapy to the rectum and nodes of the pelvis. Written consent is obtained and placed in the chart, a copy was provided to the patient. We will anticipate simulation mid November after completing his chemotherapy ***    In a visit lasting *** minutes, greater than 50% of the time was spent face to face discussing the patient's condition, in preparation for the discussion, and coordinating the patient's care.   The above documentation reflects my direct findings during this shared patient visit. Please see the separate note by Dr. Lisbeth Renshaw on this date for the remainder of the patient's plan of care.    Carola Rhine, [facility]   **Disclaimer: This note was dictated with voice recognition software. Similar sounding words can inadvertently be transcribed and this note may contain transcription errors which may not have been corrected upon publication of note.**

## 2022-01-22 ENCOUNTER — Ambulatory Visit
Admission: RE | Admit: 2022-01-22 | Discharge: 2022-01-22 | Disposition: A | Discharge status: Home / Self Care | Payer: BC Managed Care – PPO | Source: Ambulatory Visit | Attending: Radiation Oncology | Admitting: Radiation Oncology

## 2022-01-22 ENCOUNTER — Ambulatory Visit: Payer: BC Managed Care – PPO

## 2022-01-22 ENCOUNTER — Encounter: Payer: Self-pay | Admitting: Radiation Oncology

## 2022-01-22 ENCOUNTER — Other Ambulatory Visit: Payer: Self-pay

## 2022-01-22 ENCOUNTER — Telehealth: Payer: Self-pay

## 2022-01-22 ENCOUNTER — Ambulatory Visit: Payer: BC Managed Care – PPO | Admitting: Radiation Oncology

## 2022-01-22 ENCOUNTER — Other Ambulatory Visit: Payer: Self-pay | Admitting: Oncology

## 2022-01-22 VITALS — BP 128/80 | HR 74 | Temp 98.4°F | Resp 20 | Ht 73.0 in | Wt 255.6 lb

## 2022-01-22 DIAGNOSIS — Z7952 Long term (current) use of systemic steroids: Secondary | ICD-10-CM | POA: Diagnosis not present

## 2022-01-22 DIAGNOSIS — G4730 Sleep apnea, unspecified: Secondary | ICD-10-CM | POA: Insufficient documentation

## 2022-01-22 DIAGNOSIS — C20 Malignant neoplasm of rectum: Secondary | ICD-10-CM | POA: Diagnosis not present

## 2022-01-22 DIAGNOSIS — Z85048 Personal history of other malignant neoplasm of rectum, rectosigmoid junction, and anus: Secondary | ICD-10-CM | POA: Diagnosis not present

## 2022-01-22 DIAGNOSIS — F17210 Nicotine dependence, cigarettes, uncomplicated: Secondary | ICD-10-CM | POA: Insufficient documentation

## 2022-01-22 DIAGNOSIS — R918 Other nonspecific abnormal finding of lung field: Secondary | ICD-10-CM | POA: Diagnosis not present

## 2022-01-22 DIAGNOSIS — D125 Benign neoplasm of sigmoid colon: Secondary | ICD-10-CM | POA: Insufficient documentation

## 2022-01-22 NOTE — Telephone Encounter (Signed)
TC from [phone]  inquiring if Pt can get clearance for deep scaling and planing. Spoke with provider Santiago Glad who stated they were not aware Pt was undergoing treatment. Form given to Dr Benay Spice who stated Pt is cleared for procedure but Pt will have repeat labs on 8/15 and if labs are ok he can be scheduled for procedure. Form was faxed to [phone]

## 2022-01-26 ENCOUNTER — Other Ambulatory Visit: Payer: Self-pay | Admitting: Oncology

## 2022-01-26 NOTE — Progress Notes (Signed)
ON PATHWAY REGIMEN - Colorectal  No Change  Continue With Treatment as Ordered.  Original Decision Date/Time: 12/07/2021 16:15     A cycle is every 14 days:     Oxaliplatin      Leucovorin      Fluorouracil      Fluorouracil   **Always confirm dose/schedule in your pharmacy ordering system**  Patient Characteristics: Preoperative or Nonsurgical Candidate, M0 (Clinical Staging), Rectal, cT3 - cT4, cN0 or Any cT, cN+ Tumor Location: Rectal Therapeutic Status: Preoperative or Nonsurgical Candidate, M0 (Clinical Staging) AJCC T Category: cT3 AJCC N Category: cN2 AJCC M Category: cM0 AJCC 8 Stage Grouping: Unknown Intent of Therapy: Curative Intent, Discussed with Patient

## 2022-01-29 ENCOUNTER — Encounter: Payer: Self-pay | Admitting: *Deleted

## 2022-01-29 ENCOUNTER — Inpatient Hospital Stay: Payer: BC Managed Care – PPO

## 2022-01-29 ENCOUNTER — Inpatient Hospital Stay (HOSPITAL_BASED_OUTPATIENT_CLINIC_OR_DEPARTMENT_OTHER): Payer: BC Managed Care – PPO | Admitting: Oncology

## 2022-01-29 VITALS — BP 133/84 | HR 97 | Temp 98.1°F | Resp 18 | Ht 73.0 in | Wt 252.0 lb

## 2022-01-29 VITALS — BP 138/86 | HR 73 | Resp 18

## 2022-01-29 DIAGNOSIS — C20 Malignant neoplasm of rectum: Secondary | ICD-10-CM | POA: Diagnosis not present

## 2022-01-29 DIAGNOSIS — R21 Rash and other nonspecific skin eruption: Secondary | ICD-10-CM | POA: Diagnosis not present

## 2022-01-29 DIAGNOSIS — R197 Diarrhea, unspecified: Secondary | ICD-10-CM | POA: Diagnosis not present

## 2022-01-29 DIAGNOSIS — Z452 Encounter for adjustment and management of vascular access device: Secondary | ICD-10-CM | POA: Diagnosis not present

## 2022-01-29 DIAGNOSIS — G629 Polyneuropathy, unspecified: Secondary | ICD-10-CM | POA: Diagnosis not present

## 2022-01-29 DIAGNOSIS — Z5111 Encounter for antineoplastic chemotherapy: Secondary | ICD-10-CM | POA: Diagnosis not present

## 2022-01-29 DIAGNOSIS — H532 Diplopia: Secondary | ICD-10-CM | POA: Diagnosis not present

## 2022-01-29 DIAGNOSIS — F329 Major depressive disorder, single episode, unspecified: Secondary | ICD-10-CM | POA: Diagnosis not present

## 2022-01-29 LAB — CBC WITH DIFFERENTIAL (CANCER CENTER ONLY)
Abs Immature Granulocytes: 0.01 10*3/uL (ref 0.00–0.07)
Basophils Absolute: 0.1 10*3/uL (ref 0.0–0.1)
Basophils Relative: 2 %
Eosinophils Absolute: 0.4 10*3/uL (ref 0.0–0.5)
Eosinophils Relative: 9 %
HCT: 44 % (ref 39.0–52.0)
Hemoglobin: 14.8 g/dL (ref 13.0–17.0)
Immature Granulocytes: 0 %
Lymphocytes Relative: 36 %
Lymphs Abs: 1.6 10*3/uL (ref 0.7–4.0)
MCH: 29.7 pg (ref 26.0–34.0)
MCHC: 33.6 g/dL (ref 30.0–36.0)
MCV: 88.4 fL (ref 80.0–100.0)
Monocytes Absolute: 0.7 10*3/uL (ref 0.1–1.0)
Monocytes Relative: 16 %
Neutro Abs: 1.6 10*3/uL — ABNORMAL LOW (ref 1.7–7.7)
Neutrophils Relative %: 37 %
Platelet Count: 125 10*3/uL — ABNORMAL LOW (ref 150–400)
RBC: 4.98 MIL/uL (ref 4.22–5.81)
RDW: 15.7 % — ABNORMAL HIGH (ref 11.5–15.5)
WBC Count: 4.3 10*3/uL (ref 4.0–10.5)
nRBC: 0 % (ref 0.0–0.2)

## 2022-01-29 LAB — CMP (CANCER CENTER ONLY)
ALT: 49 U/L — ABNORMAL HIGH (ref 0–44)
AST: 40 U/L (ref 15–41)
Albumin: 4 g/dL (ref 3.5–5.0)
Alkaline Phosphatase: 60 U/L (ref 38–126)
Anion gap: 9 (ref 5–15)
BUN: 16 mg/dL (ref 6–20)
CO2: 25 mmol/L (ref 22–32)
Calcium: 9.4 mg/dL (ref 8.9–10.3)
Chloride: 105 mmol/L (ref 98–111)
Creatinine: 0.73 mg/dL (ref 0.61–1.24)
GFR, Estimated: >60 mL/min (ref >60)
Glucose, Bld: 98 mg/dL (ref 70–99)
Potassium: 3.8 mmol/L (ref 3.5–5.1)
Sodium: 139 mmol/L (ref 135–145)
Total Bilirubin: 0.4 mg/dL (ref 0.3–1.2)
Total Protein: 6.8 g/dL (ref 6.5–8.1)

## 2022-01-29 MED ORDER — LEUCOVORIN CALCIUM INJECTION 350 MG
300.0000 mg/m2 | Freq: Once | INTRAVENOUS | Status: AC
  Administered 2022-01-29: 732 mg via INTRAVENOUS
  Filled 2022-01-29: qty 36.6

## 2022-01-29 MED ORDER — OXALIPLATIN CHEMO INJECTION 100 MG/20ML
150.0000 mg | Freq: Once | INTRAVENOUS | Status: AC
  Administered 2022-01-29: 150 mg via INTRAVENOUS
  Filled 2022-01-29: qty 20

## 2022-01-29 MED ORDER — FLUOROURACIL CHEMO INJECTION 2.5 GM/50ML
300.0000 mg/m2 | Freq: Once | INTRAVENOUS | Status: AC
  Administered 2022-01-29: 750 mg via INTRAVENOUS
  Filled 2022-01-29: qty 15

## 2022-01-29 MED ORDER — SODIUM CHLORIDE 0.9 % IV SOLN
10.0000 mg | Freq: Once | INTRAVENOUS | Status: AC
  Administered 2022-01-29: 10 mg via INTRAVENOUS
  Filled 2022-01-29: qty 1

## 2022-01-29 MED ORDER — SODIUM CHLORIDE 0.9 % IV SOLN
2400.0000 mg/m2 | INTRAVENOUS | Status: DC
  Administered 2022-01-29: 5850 mg via INTRAVENOUS
  Filled 2022-01-29: qty 50

## 2022-01-29 MED ORDER — DEXTROSE 5 % IV SOLN: Freq: Once | INTRAVENOUS | Status: AC

## 2022-01-29 MED ORDER — PALONOSETRON HCL INJECTION 0.25 MG/5ML
0.2500 mg | Freq: Once | INTRAVENOUS | Status: AC
  Administered 2022-01-29: 0.25 mg via INTRAVENOUS

## 2022-01-29 NOTE — Patient Instructions (Addendum)
[facility]   Discharge Instructions: Thank you for choosing [facility] to provide your oncology and hematology care.   If you have a lab appointment with the [facility], please go directly to the [facility] and check in at the registration area.   Wear comfortable clothing and clothing appropriate for easy access to any Portacath or PICC line.   We strive to give you quality time with your provider. You may need to reschedule your appointment if you arrive late (15 or more minutes).  Arriving late affects you and other patients whose appointments are after yours.  Also, if you miss three or more appointments without notifying the office, you may be dismissed from the clinic at the provider's discretion.      For prescription refill requests, have your pharmacy contact our office and allow 72 hours for refills to be completed.    Today you received the following chemotherapy and/or immunotherapy agents Oxaliplatin (ELOXATIN), Leucovorin & Flourouracil (ADRUCIL).      To help prevent nausea and vomiting after your treatment, we encourage you to take your nausea medication as directed.  BELOW ARE SYMPTOMS THAT SHOULD BE REPORTED IMMEDIATELY: *FEVER GREATER THAN 100.4 F (38 C) OR HIGHER *CHILLS OR SWEATING *NAUSEA AND VOMITING THAT IS NOT CONTROLLED WITH YOUR NAUSEA MEDICATION *UNUSUAL SHORTNESS OF BREATH *UNUSUAL BRUISING OR BLEEDING *URINARY PROBLEMS (pain or burning when urinating, or frequent urination) *BOWEL PROBLEMS (unusual diarrhea, constipation, pain near the anus) TENDERNESS IN MOUTH AND THROAT WITH OR WITHOUT PRESENCE OF ULCERS (sore throat, sores in mouth, or a toothache) UNUSUAL RASH, SWELLING OR PAIN  UNUSUAL VAGINAL DISCHARGE OR ITCHING   Items with * indicate a potential emergency and should be followed up as soon as possible or go to the Emergency Department if any problems should occur.  Please show the CHEMOTHERAPY ALERT  CARD or IMMUNOTHERAPY ALERT CARD at check-in to the Emergency Department and triage nurse.  Should you have questions after your visit or need to cancel or reschedule your appointment, please contact [facility]  Dept: [phone]  and follow the prompts.  Office hours are 8:00 a.m. to 4:30 p.m. Monday - Friday. Please note that voicemails left after 4:00 p.m. may not be returned until the following business day.  We are closed weekends and major holidays. You have access to a nurse at all times for urgent questions. Please call the main number to the clinic Dept: [phone] and follow the prompts.   For any non-urgent questions, you may also contact your provider using MyChart. We now offer e-Visits for anyone 68 and older to request care online for non-urgent symptoms. For details visit [email or web address].   Also download the MyChart app! Go to the app store, search "MyChart", open the app, select , and log in with your MyChart username and password.  Masks are optional in the cancer centers. If you would like for your care team to wear a mask while they are taking care of you, please let them know. You may have one support person who is at least 59 years old accompany you for your appointments.  Oxaliplatin Injection What is this medication? OXALIPLATIN (ox AL i PLA tin) treats some types of cancer. It works by slowing down the growth of cancer cells. This medicine may be used for other purposes; ask your health care provider or pharmacist if you have questions. COMMON BRAND NAME(S): Eloxatin What should I tell my  care team before I take this medication? They need to know if you have any of these conditions: Heart disease History of irregular heartbeat or rhythm Liver disease Low blood cell levels (white cells, red cells, and platelets) Lung or breathing disease, such as asthma Take medications that treat or prevent blood clots Tingling of the  fingers, toes, or other nerve disorder An unusual or allergic reaction to oxaliplatin, other medications, foods, dyes, or preservatives If you or your partner are pregnant or trying to get pregnant Breast-feeding How should I use this medication? This medication is injected into a vein. It is given by your care team in a hospital or clinic setting. Talk to your care team about the use of this medication in children. Special care may be needed. Overdosage: If you think you have taken too much of this medicine contact a poison control center or emergency room at once. NOTE: This medicine is only for you. Do not share this medicine with others. What if I miss a dose? Keep appointments for follow-up doses. It is important not to miss a dose. Call your care team if you are unable to keep an appointment. What may interact with this medication? Do not take this medication with any of the following: Cisapride Dronedarone Pimozide Thioridazine This medication may also interact with the following: Aspirin and aspirin-like medications Certain medications that treat or prevent blood clots, such as warfarin, apixaban, dabigatran, and rivaroxaban Cisplatin Cyclosporine Diuretics Medications for infection, such as acyclovir, adefovir, amphotericin B, bacitracin, cidofovir, foscarnet, ganciclovir, gentamicin, pentamidine, vancomycin NSAIDs, medications for pain and inflammation, such as ibuprofen or naproxen Other medications that cause heart rhythm changes Pamidronate Zoledronic acid This list may not describe all possible interactions. Give your health care provider a list of all the medicines, herbs, non-prescription drugs, or dietary supplements you use. Also tell them if you smoke, drink alcohol, or use illegal drugs. Some items may interact with your medicine. What should I watch for while using this medication? Your condition will be monitored carefully while you are receiving this  medication. You may need blood work while taking this medication. This medication may make you feel generally unwell. This is not uncommon as chemotherapy can affect healthy cells as well as cancer cells. Report any side effects. Continue your course of treatment even though you feel ill unless your care team tells you to stop. This medication may increase your risk of getting an infection. Call your care team for advice if you get a fever, chills, sore throat, or other symptoms of a cold or flu. Do not treat yourself. Try to avoid being around people who are sick. Avoid taking medications that contain aspirin, acetaminophen, ibuprofen, naproxen, or ketoprofen unless instructed by your care team. These medications may hide a fever. Be careful brushing or flossing your teeth or using a toothpick because you may get an infection or bleed more easily. If you have any dental work done, tell your dentist you are receiving this medication. This medication can make you more sensitive to cold. Do not drink cold drinks or use ice. Cover exposed skin before coming in contact with cold temperatures or cold objects. When out in cold weather wear warm clothing and cover your mouth and nose to warm the air that goes into your lungs. Tell your care team if you get sensitive to the cold. Talk to your care team if you or your partner are pregnant or think either of you might be pregnant. This medication can  cause serious birth defects if taken during pregnancy and for 9 months after the last dose. A negative pregnancy test is required before starting this medication. A reliable form of contraception is recommended while taking this medication and for 9 months after the last dose. Talk to your care team about effective forms of contraception. Do not father a child while taking this medication and for 6 months after the last dose. Use a condom while having sex during this time period. Do not breastfeed while taking this  medication and for 3 months after the last dose. This medication may cause infertility. Talk to your care team if you are concerned about your fertility. What side effects may I notice from receiving this medication? Side effects that you should report to your care team as soon as possible: Allergic reactions--skin rash, itching, hives, swelling of the face, lips, tongue, or throat Bleeding--bloody or black, tar-like stools, vomiting blood or brown material that looks like coffee grounds, red or dark brown urine, small red or purple spots on skin, unusual bruising or bleeding Dry cough, shortness of breath or trouble breathing Heart rhythm changes--fast or irregular heartbeat, dizziness, feeling faint or lightheaded, chest pain, trouble breathing Infection--fever, chills, cough, sore throat, wounds that don't heal, pain or trouble when passing urine, general feeling of discomfort or being unwell Liver injury--right upper belly pain, loss of appetite, nausea, light-colored stool, dark yellow or brown urine, yellowing skin or eyes, unusual weakness or fatigue Low red blood cell level--unusual weakness or fatigue, dizziness, headache, trouble breathing Muscle injury--unusual weakness or fatigue, muscle pain, dark yellow or brown urine, decrease in amount of urine Pain, tingling, or numbness in the hands or feet Sudden and severe headache, confusion, change in vision, seizures, which may be signs of posterior reversible encephalopathy syndrome (PRES) Unusual bruising or bleeding Side effects that usually do not require medical attention (report to your care team if they continue or are bothersome): Diarrhea Nausea Pain, redness, or swelling with sores inside the mouth or throat Unusual weakness or fatigue Vomiting This list may not describe all possible side effects. Call your doctor for medical advice about side effects. You may report side effects to FDA at 1-800-FDA-1088. Where should I keep my  medication? This medication is given in a hospital or clinic. It will not be stored at home. NOTE: This sheet is a summary. It may not cover all possible information. If you have questions about this medicine, talk to your doctor, pharmacist, or health care provider.  2023 Elsevier/Gold Standard (2021-09-28 00:00:00)  Leucovorin Injection What is this medication? LEUCOVORIN (loo koe VOR in) prevents side effects from certain medications, such as methotrexate. It works by increasing folate levels. This helps protect healthy cells in your body. It may also be used to treat anemia caused by low levels of folate. It can also be used with fluorouracil, a type of chemotherapy, to treat colorectal cancer. It works by increasing the effects of fluorouracil in the body. This medicine may be used for other purposes; ask your health care provider or pharmacist if you have questions. What should I tell my care team before I take this medication? They need to know if you have any of these conditions: Anemia from low levels of vitamin B12 in the blood An unusual or allergic reaction to leucovorin, folic acid, other medications, foods, dyes, or preservatives Pregnant or trying to get pregnant Breastfeeding How should I use this medication? This medication is injected into a vein or a  muscle. It is given by your care team in a hospital or clinic setting. Talk to your care team about the use of this medication in children. Special care may be needed. Overdosage: If you think you have taken too much of this medicine contact a poison control center or emergency room at once. NOTE: This medicine is only for you. Do not share this medicine with others. What if I miss a dose? Keep appointments for follow-up doses. It is important not to miss your dose. Call your care team if you are unable to keep an appointment. What may interact with this  medication? Capecitabine Fluorouracil Phenobarbital Phenytoin Primidone Trimethoprim;sulfamethoxazole This list may not describe all possible interactions. Give your health care provider a list of all the medicines, herbs, non-prescription drugs, or dietary supplements you use. Also tell them if you smoke, drink alcohol, or use illegal drugs. Some items may interact with your medicine. What should I watch for while using this medication? Your condition will be monitored carefully while you are receiving this medication. This medication may increase the side effects of 5-fluorouracil. Tell your care team if you have diarrhea or mouth sores that do not get better or that get worse. What side effects may I notice from receiving this medication? Side effects that you should report to your care team as soon as possible: Allergic reactions--skin rash, itching, hives, swelling of the face, lips, tongue, or throat This list may not describe all possible side effects. Call your doctor for medical advice about side effects. You may report side effects to FDA at 1-800-FDA-1088. Where should I keep my medication? This medication is given in a hospital or clinic. It will not be stored at home. NOTE: This sheet is a summary. It may not cover all possible information. If you have questions about this medicine, talk to your doctor, pharmacist, or health care provider.  2023 Elsevier/Gold Standard (2021-10-12 00:00:00)  Fluorouracil Injection What is this medication? FLUOROURACIL (flure oh YOOR a sil) treats some types of cancer. It works by slowing down the growth of cancer cells. This medicine may be used for other purposes; ask your health care provider or pharmacist if you have questions. COMMON BRAND NAME(S): Adrucil What should I tell my care team before I take this medication? They need to know if you have any of these conditions: Blood disorders Dihydropyrimidine dehydrogenase (DPD)  deficiency Infection, such as chickenpox, cold sores, herpes Kidney disease Liver disease Poor nutrition Recent or ongoing radiation therapy An unusual or allergic reaction to fluorouracil, other medications, foods, dyes, or preservatives If you or your partner are pregnant or trying to get pregnant Breast-feeding How should I use this medication? This medication is injected into a vein. It is administered by your care team in a hospital or clinic setting. Talk to your care team about the use of this medication in children. Special care may be needed. Overdosage: If you think you have taken too much of this medicine contact a poison control center or emergency room at once. NOTE: This medicine is only for you. Do not share this medicine with others. What if I miss a dose? Keep appointments for follow-up doses. It is important not to miss your dose. Call your care team if you are unable to keep an appointment. What may interact with this medication? Do not take this medication with any of the following: Live virus vaccines This medication may also interact with the following: Medications that treat or prevent blood clots, such  as warfarin, enoxaparin, dalteparin This list may not describe all possible interactions. Give your health care provider a list of all the medicines, herbs, non-prescription drugs, or dietary supplements you use. Also tell them if you smoke, drink alcohol, or use illegal drugs. Some items may interact with your medicine. What should I watch for while using this medication? Your condition will be monitored carefully while you are receiving this medication. This medication may make you feel generally unwell. This is not uncommon as chemotherapy can affect healthy cells as well as cancer cells. Report any side effects. Continue your course of treatment even though you feel ill unless your care team tells you to stop. In some cases, you may be given additional medications  to help with side effects. Follow all directions for their use. This medication may increase your risk of getting an infection. Call your care team for advice if you get a fever, chills, sore throat, or other symptoms of a cold or flu. Do not treat yourself. Try to avoid being around people who are sick. This medication may increase your risk to bruise or bleed. Call your care team if you notice any unusual bleeding. Be careful brushing or flossing your teeth or using a toothpick because you may get an infection or bleed more easily. If you have any dental work done, tell your dentist you are receiving this medication. Avoid taking medications that contain aspirin, acetaminophen, ibuprofen, naproxen, or ketoprofen unless instructed by your care team. These medications may hide a fever. Do not treat diarrhea with over the counter products. Contact your care team if you have diarrhea that lasts more than 2 days or if it is severe and watery. This medication can make you more sensitive to the sun. Keep out of the sun. If you cannot avoid being in the sun, wear protective clothing and sunscreen. Do not use sun lamps, tanning beds, or tanning booths. Talk to your care team if you or your partner wish to become pregnant or think you might be pregnant. This medication can cause serious birth defects if taken during pregnancy and for 3 months after the last dose. A reliable form of contraception is recommended while taking this medication and for 3 months after the last dose. Talk to your care team about effective forms of contraception. Do not father a child while taking this medication and for 3 months after the last dose. Use a condom while having sex during this time period. Do not breastfeed while taking this medication. This medication may cause infertility. Talk to your care team if you are concerned about your fertility. What side effects may I notice from receiving this medication? Side effects that you  should report to your care team as soon as possible: Allergic reactions--skin rash, itching, hives, swelling of the face, lips, tongue, or throat Heart attack--pain or tightness in the chest, shoulders, arms, or jaw, nausea, shortness of breath, cold or clammy skin, feeling faint or lightheaded Heart failure--shortness of breath, swelling of the ankles, feet, or hands, sudden weight gain, unusual weakness or fatigue Heart rhythm changes--fast or irregular heartbeat, dizziness, feeling faint or lightheaded, chest pain, trouble breathing High ammonia level--unusual weakness or fatigue, confusion, loss of appetite, nausea, vomiting, seizures Infection--fever, chills, cough, sore throat, wounds that don't heal, pain or trouble when passing urine, general feeling of discomfort or being unwell Low red blood cell level--unusual weakness or fatigue, dizziness, headache, trouble breathing Pain, tingling, or numbness in the hands or feet, muscle weakness,  change in vision, confusion or trouble speaking, loss of balance or coordination, trouble walking, seizures Redness, swelling, and blistering of the skin over hands and feet Severe or prolonged diarrhea Unusual bruising or bleeding Side effects that usually do not require medical attention (report to your care team if they continue or are bothersome): Dry skin Headache Increased tears Nausea Pain, redness, or swelling with sores inside the mouth or throat Sensitivity to light Vomiting This list may not describe all possible side effects. Call your doctor for medical advice about side effects. You may report side effects to FDA at 1-800-FDA-1088. Where should I keep my medication? This medication is given in a hospital or clinic. It will not be stored at home. NOTE: This sheet is a summary. It may not cover all possible information. If you have questions about this medicine, talk to your doctor, pharmacist, or health care provider.  2023 Elsevier/Gold  Standard (2021-10-09 00:00:00)  The chemotherapy medication bag should finish at 46 hours, 96 hours, or 7 days. For example, if your pump is scheduled for 46 hours and it was put on at 4:00 p.m., it should finish at 2:00 p.m. the day it is scheduled to come off regardless of your appointment time.     Estimated time to finish at on 1:15 Thursday 01/31/2022.   If the display on your pump reads "Low Volume" and it is beeping, take the batteries out of the pump and come to the cancer center for it to be taken off.   If the pump alarms go off prior to the pump reading "Low Volume" then call [phone] and someone can assist you.  If the plunger comes out and the chemotherapy medication is leaking out, please use your home chemo spill kit to clean up the spill. Do NOT use paper towels or other household products.  If you have problems or questions regarding your pump, please call either [phone] (24 hours a day) or the cancer center Monday-Friday 8:00 a.m.- 4:30 p.m. at the clinic number and we will assist you. If you are unable to get assistance, then go to the nearest Emergency Department and ask the staff to contact the IV team for assistance.

## 2022-01-29 NOTE — Progress Notes (Signed)
  [facility] OFFICE PROGRESS NOTE   Diagnosis: Rectal cancer  INTERVAL HISTORY:   Billy Billy Roman completed another cycle of FOLFOX on 01/15/2022.  He reports a few days of diarrhea.  No nausea/vomiting.  Cold sensitivity lasted a few days.  No significant change in baseline neuropathy symptoms.  He is trying to discontinue smoking.  He would like to resume Wellbutrin.  He has noted a rash at the forearms.  Objective:  Vital signs in last 24 hours:  Blood pressure 133/84, pulse 97, temperature 98.1 F (36.7 C), temperature source Oral, resp. rate 18, height '6\' 1"'$  (1.854 m), weight 252 lb (114.3 kg), SpO2 100 %.    HEENT: No thrush or ulcers Resp: Lungs clear bilaterally Cardio: Regular rate and rhythm GI: No hepatosplenomegaly Vascular: No leg edema  Skin: Palms without erythema, erythematous slightly raised rash over the forearm bilaterally, erythematous rash at the right side of the face  Portacath/PICC-without erythema  Lab Results:  Lab Results  Component Value Date   WBC 4.3 01/29/2022   HGB 14.8 01/29/2022   HCT 44.0 01/29/2022   MCV 88.4 01/29/2022   PLT 125 (L) 01/29/2022   NEUTROABS 1.6 (L) 01/29/2022    CMP  Lab Results  Component Value Date   NA 139 01/15/2022   K 3.9 01/15/2022   CL 103 01/15/2022   CO2 26 01/15/2022   GLUCOSE 104 (H) 01/15/2022   BUN 13 01/15/2022   CREATININE 0.83 01/15/2022   CALCIUM 9.4 01/15/2022   PROT 6.7 01/15/2022   ALBUMIN 4.2 01/15/2022   AST 30 01/15/2022   ALT 33 01/15/2022   ALKPHOS 61 01/15/2022   BILITOT 0.5 01/15/2022   GFRNONAA >60 01/15/2022   GFRAA 117 07/26/2019    Lab Results  Component Value Date   CEA 3.65 12/14/2021    Medications: I have reviewed the patient's current medications.   Assessment/Plan: Adenocarcinoma of the proximal rectum/distal sigmoid Colonoscopy 11/19/2021-partially obstructing mass at 12-19 cm, biopsy moderate to poorly differentiated adenocarcinoma Mildly elevated  CEA CTs 11/29/2021-mural thickening in the distal sigmoid/proximal rectum with haziness of the mesorectal fat, mesorectal lymphadenopathy, small pulmonary nodules favored to be benign MRI pelvis 12/03/2021-tumor at 10 cm from the anal verge, T3c, approximately 10 metastatic nodes in the mesorectal sheath and presacral space, no extra mesorectal lymphadenopathy, N2 Cycle 1 FOLFOX 12/19/2021 Cycle 2 FOLFOX 01/02/2022, home Decadron prophylaxis added for delayed nausea Cycle 3 FOLFOX 01/15/2022 Cycle 4 FOLFOX 01/29/2022, 5-FU bolus, leucovorin, and oxaliplatin dose reduced secondary to neutropenia and thrombocytopenia Major depressive disorder Change in bowel habits and rectal bleeding secondary to #1 Ongoing tobacco use Report of blurred vision and diplopia Peripheral neuropathy-fingers and left foot    Disposition: Mr. Billy Billy Roman has completed 3 cycles of FOLFOX.  He has tolerated the chemotherapy well.  He has mild neutropenia.  He will call for a fever or symptoms of an infection.  He will complete cycle 4 FOLFOX today.  The platelet count is mildly low.  Oxaliplatin and the 5-FU bolus will be dose reduced with this cycle.  We will dose reduce the 5-FU infusion if the forearm rash progresses.  I encouraged him to avoid heavy sun exposure.  He will follow-up with his primary provider to discuss beginning Wellbutrin.  Mr. Billy Billy Roman will return for an office visit and chemotherapy in 2 weeks.  [name], MD  01/29/2022  10:36 AM

## 2022-01-29 NOTE — Progress Notes (Signed)
Patient seen by Dr. Benay Spice today  Vitals are within treatment parameters.  Labs reviewed by Dr. Benay Spice and are not all within treatment parameters. ANC 1.6-- OK to proceed.   Per physician team, patient is ready for treatment. Please note that modifications are being made to the treatment plan including Dose reduction of Oxaliplatin, 5FU bolus and LV. Will add Udenyca to next cycle.

## 2022-01-31 ENCOUNTER — Inpatient Hospital Stay: Payer: BC Managed Care – PPO

## 2022-01-31 VITALS — BP 132/98 | HR 66 | Temp 98.6°F | Resp 17

## 2022-01-31 DIAGNOSIS — H532 Diplopia: Secondary | ICD-10-CM | POA: Diagnosis not present

## 2022-01-31 DIAGNOSIS — C20 Malignant neoplasm of rectum: Secondary | ICD-10-CM | POA: Diagnosis not present

## 2022-01-31 DIAGNOSIS — R197 Diarrhea, unspecified: Secondary | ICD-10-CM | POA: Diagnosis not present

## 2022-01-31 DIAGNOSIS — G629 Polyneuropathy, unspecified: Secondary | ICD-10-CM | POA: Diagnosis not present

## 2022-01-31 DIAGNOSIS — Z452 Encounter for adjustment and management of vascular access device: Secondary | ICD-10-CM | POA: Diagnosis not present

## 2022-01-31 DIAGNOSIS — F329 Major depressive disorder, single episode, unspecified: Secondary | ICD-10-CM | POA: Diagnosis not present

## 2022-01-31 DIAGNOSIS — R21 Rash and other nonspecific skin eruption: Secondary | ICD-10-CM | POA: Diagnosis not present

## 2022-01-31 DIAGNOSIS — Z5111 Encounter for antineoplastic chemotherapy: Secondary | ICD-10-CM | POA: Diagnosis not present

## 2022-01-31 MED ORDER — HEPARIN SOD (PORK) LOCK FLUSH 100 UNIT/ML IV SOLN
500.0000 [IU] | Freq: Once | INTRAVENOUS | Status: AC | PRN
  Administered 2022-01-31: 500 [IU]

## 2022-01-31 MED ORDER — SODIUM CHLORIDE 0.9% FLUSH
10.0000 mL | INTRAVENOUS | Status: DC | PRN
  Administered 2022-01-31: 10 mL

## 2022-01-31 NOTE — Patient Instructions (Signed)

## 2022-02-02 ENCOUNTER — Emergency Department (HOSPITAL_BASED_OUTPATIENT_CLINIC_OR_DEPARTMENT_OTHER)
Admission: EM | Admit: 2022-02-02 | Discharge: 2022-02-02 | Disposition: A | Discharge status: Home / Self Care | Payer: BC Managed Care – PPO | Attending: Emergency Medicine | Admitting: Emergency Medicine

## 2022-02-02 ENCOUNTER — Other Ambulatory Visit: Payer: Self-pay

## 2022-02-02 ENCOUNTER — Emergency Department (HOSPITAL_BASED_OUTPATIENT_CLINIC_OR_DEPARTMENT_OTHER): Payer: BC Managed Care – PPO

## 2022-02-02 DIAGNOSIS — D696 Thrombocytopenia, unspecified: Secondary | ICD-10-CM | POA: Insufficient documentation

## 2022-02-02 DIAGNOSIS — R7401 Elevation of levels of liver transaminase levels: Secondary | ICD-10-CM | POA: Insufficient documentation

## 2022-02-02 DIAGNOSIS — R749 Abnormal serum enzyme level, unspecified: Secondary | ICD-10-CM | POA: Diagnosis not present

## 2022-02-02 DIAGNOSIS — R410 Disorientation, unspecified: Secondary | ICD-10-CM | POA: Insufficient documentation

## 2022-02-02 DIAGNOSIS — Z85048 Personal history of other malignant neoplasm of rectum, rectosigmoid junction, and anus: Secondary | ICD-10-CM | POA: Insufficient documentation

## 2022-02-02 DIAGNOSIS — R110 Nausea: Secondary | ICD-10-CM | POA: Insufficient documentation

## 2022-02-02 DIAGNOSIS — R748 Abnormal levels of other serum enzymes: Secondary | ICD-10-CM | POA: Diagnosis not present

## 2022-02-02 DIAGNOSIS — R251 Tremor, unspecified: Secondary | ICD-10-CM | POA: Insufficient documentation

## 2022-02-02 DIAGNOSIS — H539 Unspecified visual disturbance: Secondary | ICD-10-CM | POA: Diagnosis not present

## 2022-02-02 DIAGNOSIS — H538 Other visual disturbances: Secondary | ICD-10-CM | POA: Insufficient documentation

## 2022-02-02 DIAGNOSIS — D72819 Decreased white blood cell count, unspecified: Secondary | ICD-10-CM | POA: Insufficient documentation

## 2022-02-02 DIAGNOSIS — R6883 Chills (without fever): Secondary | ICD-10-CM | POA: Insufficient documentation

## 2022-02-02 DIAGNOSIS — R519 Headache, unspecified: Secondary | ICD-10-CM | POA: Diagnosis not present

## 2022-02-02 LAB — COMPREHENSIVE METABOLIC PANEL
ALT: 59 U/L — ABNORMAL HIGH (ref 0–44)
AST: 41 U/L (ref 15–41)
Albumin: 4.3 g/dL (ref 3.5–5.0)
Alkaline Phosphatase: 51 U/L (ref 38–126)
Anion gap: 10 (ref 5–15)
BUN: 13 mg/dL (ref 6–20)
CO2: 22 mmol/L (ref 22–32)
Calcium: 8.9 mg/dL (ref 8.9–10.3)
Chloride: 103 mmol/L (ref 98–111)
Creatinine, Ser: 0.61 mg/dL (ref 0.61–1.24)
GFR, Estimated: >60 mL/min (ref >60)
Glucose, Bld: 111 mg/dL — ABNORMAL HIGH (ref 70–99)
Potassium: 4.1 mmol/L (ref 3.5–5.1)
Sodium: 135 mmol/L (ref 135–145)
Total Bilirubin: 0.8 mg/dL (ref 0.3–1.2)
Total Protein: 7.3 g/dL (ref 6.5–8.1)

## 2022-02-02 LAB — DIFFERENTIAL
Abs Immature Granulocytes: 0.01 10*3/uL (ref 0.00–0.07)
Basophils Absolute: 0 10*3/uL (ref 0.0–0.1)
Basophils Relative: 1 %
Eosinophils Absolute: 0.1 10*3/uL (ref 0.0–0.5)
Eosinophils Relative: 2 %
Immature Granulocytes: 0 %
Lymphocytes Relative: 22 %
Lymphs Abs: 0.7 10*3/uL (ref 0.7–4.0)
Monocytes Absolute: 0.1 10*3/uL (ref 0.1–1.0)
Monocytes Relative: 2 %
Neutro Abs: 2.4 10*3/uL (ref 1.7–7.7)
Neutrophils Relative %: 73 %

## 2022-02-02 LAB — CBC
HCT: 44.8 % (ref 39.0–52.0)
Hemoglobin: 15.2 g/dL (ref 13.0–17.0)
MCH: 29.7 pg (ref 26.0–34.0)
MCHC: 33.9 g/dL (ref 30.0–36.0)
MCV: 87.7 fL (ref 80.0–100.0)
Platelets: 143 10*3/uL — ABNORMAL LOW (ref 150–400)
RBC: 5.11 MIL/uL (ref 4.22–5.81)
RDW: 15.5 % (ref 11.5–15.5)
WBC: 3.4 10*3/uL — ABNORMAL LOW (ref 4.0–10.5)
nRBC: 0 % (ref 0.0–0.2)

## 2022-02-02 LAB — URINALYSIS, ROUTINE W REFLEX MICROSCOPIC
Bilirubin Urine: NEGATIVE
Glucose, UA: NEGATIVE mg/dL
Hgb urine dipstick: NEGATIVE
Ketones, ur: NEGATIVE mg/dL
Leukocytes,Ua: NEGATIVE
Nitrite: NEGATIVE
Protein, ur: NEGATIVE mg/dL
Specific Gravity, Urine: 1.02 (ref 1.005–1.030)
pH: 6.5 (ref 5.0–8.0)

## 2022-02-02 LAB — LIPASE, BLOOD: Lipase: 408 U/L — ABNORMAL HIGH (ref 11–51)

## 2022-02-02 MED ORDER — ONDANSETRON 4 MG PO TBDP: 4.0000 mg | ORAL_TABLET | Freq: Three times a day (TID) | ORAL | 0 refills | Status: DC | PRN

## 2022-02-02 MED ORDER — HYDROCODONE-ACETAMINOPHEN 5-325 MG PO TABS: 2.0000 | ORAL_TABLET | ORAL | 0 refills | Status: DC | PRN

## 2022-02-02 MED ORDER — ONDANSETRON 4 MG PO TBDP
4.0000 mg | ORAL_TABLET | Freq: Once | ORAL | Status: AC | PRN
  Administered 2022-02-02: 4 mg via ORAL
  Filled 2022-02-02: qty 1

## 2022-02-02 NOTE — ED Triage Notes (Addendum)
Patient arrives with complaints of chills/tremor (no fever). Patient had his scheduled chemo 2 days ago. Patient also reports nausea, blurry vision, and feelings of confusion.  Currently getting chemo for Colorectal Cancer.

## 2022-02-02 NOTE — ED Provider Notes (Signed)
[facility] EMERGENCY DEPT Provider Note   CSN: [id number] Arrival date & time: 02/02/22  1629     History  Chief Complaint  Patient presents with   Chills    No fever   Nausea    Billy Roman is a 61 y.o. male.  61 year old male with past medical history significant for colorectal cancer who is on FOLFOX.  Reports he has had 4 sessions.  He takes Decadron 4 mg twice daily following his sessions for 2 days.  He states today after he woke up and had his morning coffee he noticed tremors in his left upper extremity, blurred vision, confusion, and has been nauseous.  Denies abdominal pain, chest pain, shortness of breath.  He called on-call oncology office and was recommended to come in for evaluation.  Denies other complaints at this time.  The history is provided by the patient. No language interpreter was used.       Home Medications Prior to Admission medications   Medication Sig Start Date End Date Taking? Authorizing Provider  atomoxetine (STRATTERA) 10 MG capsule Take 10 mg by mouth daily.    [provider]  dexamethasone (DECADRON) 4 MG tablet Take 4 mg twice daily x 2 days after each chemotherapy treatment starting on day 2 of each cycle 01/17/22   Owens Shark, NP  gabapentin (NEURONTIN) 300 MG capsule Take 100 mg by mouth 2 (two) times daily as needed (neuropathy). 09/27/21   [provider]  lidocaine-prilocaine (EMLA) cream Apply 1 Application topically as needed. 12/14/21   Ladell Pier, MD  ondansetron (ZOFRAN) 8 MG tablet Take 1 tablet (8 mg total) by mouth every 8 (eight) hours as needed for nausea or vomiting (Starting on day 4 if needed for nausea and vomiting). 12/14/21   Ladell Pier, MD  prochlorperazine (COMPAZINE) 10 MG tablet Take 1 tablet (10 mg total) by mouth every 6 (six) hours as needed for nausea or vomiting. 12/14/21   Ladell Pier, MD  Vitamin D, Ergocalciferol, (DRISDOL) 1.25 MG (50000 UNIT) CAPS capsule  Take 1 capsule (50,000 Units total) by mouth every 7 (seven) days. Patient not taking: Reported on 12/10/2021 09/01/21   Briant Cedar, MD      Allergies    Propyphenazone    Review of Systems   Review of Systems  Constitutional:  Positive for chills. Negative for fever.  Eyes:  Positive for visual disturbance.  Respiratory:  Negative for shortness of breath.   Cardiovascular:  Negative for chest pain.  Gastrointestinal:  Positive for nausea. Negative for abdominal pain and vomiting.  Genitourinary:  Negative for dysuria.  Neurological:  Negative for syncope, weakness, light-headedness and headaches.  All other systems reviewed and are negative.   Physical Exam Updated Vital Signs BP 103/79   Pulse 76   Temp 98.4 F (36.9 C)   Resp 20   Ht '6\' 1"'$  (1.854 m)   Wt 114.3 kg   SpO2 97%   BMI 33.25 kg/m  Physical Exam Vitals and nursing note reviewed.  Constitutional:      General: He is not in acute distress.    Appearance: Normal appearance. He is not ill-appearing.  HENT:     Head: Normocephalic and atraumatic.     Nose: Nose normal.  Eyes:     General: No scleral icterus.    Extraocular Movements: Extraocular movements intact.     Conjunctiva/sclera: Conjunctivae normal.  Cardiovascular:     Rate and Rhythm: Normal rate  and regular rhythm.     Pulses: Normal pulses.     Heart sounds: Normal heart sounds.  Pulmonary:     Effort: Pulmonary effort is normal. No respiratory distress.     Breath sounds: Normal breath sounds. No wheezing or rales.  Abdominal:     General: There is no distension.     Tenderness: There is no abdominal tenderness.  Musculoskeletal:        General: Normal range of motion.     Cervical back: Normal range of motion.  Skin:    General: Skin is warm and dry.  Neurological:     General: No focal deficit present.     Mental Status: He is alert. Mental status is at baseline.     Comments: Cranial nerves III through XII intact.  Without  pronator drift.  Tongue midline.  Without facial droop.  Full range of motion of bilateral upper and lower extremities with 5/5 strength in extensor and flexor muscle groups.     ED Results / Procedures / Treatments   Labs (all labs ordered are listed, but only abnormal results are displayed) Labs Reviewed  LIPASE, BLOOD - Abnormal; Notable for the following components:      Result Value   Lipase 408 (*)    All other components within normal limits  COMPREHENSIVE METABOLIC PANEL - Abnormal; Notable for the following components:   Glucose, Bld 111 (*)    ALT 59 (*)    All other components within normal limits  CBC - Abnormal; Notable for the following components:   WBC 3.4 (*)    Platelets 143 (*)    All other components within normal limits  DIFFERENTIAL  URINALYSIS, ROUTINE W REFLEX MICROSCOPIC    EKG None  Radiology No results found.  Procedures Procedures    Medications Ordered in ED Medications  ondansetron (ZOFRAN-ODT) disintegrating tablet 4 mg (has no administration in time range)    ED Course/ Medical Decision Making/ A&P                           Medical Decision Making Amount and/or Complexity of Data Reviewed Labs: ordered. Radiology: ordered.  Risk Prescription drug management.   Medical Decision Making / ED Course   This patient presents to the ED for concern of chills, left upper extremity tremor, blurred vision, this involves an extensive number of treatment options, and is a complaint that carries with it a high risk of complications and morbidity.  The differential diagnosis includes metastasis of known malignancy, dehydration, CVA  MDM: 61 year old male with history of colorectal cancer on FOLFOX presents today for evaluation of near vision, left upper extremity tremors, confusion, and chills since this morning.  This all started after he had his morning coffee.  He reached out to his on-call oncologist recommended to come in for evaluation.   Work-up shows CBC with mild leukopenia but no neutropenia, mild thrombocytopenia which is patient's baseline.  UA without evidence of UTI.  CMP which is unremarkable with exception of mildly elevated ALT of 59 and glucose of 111.  Lipase is elevated to 408.  Denies prior history of pancreatitis.  Denies any significant abdominal pain.  Does have nausea but states this is not unusual for him and he has not had something to eat which she has been prior to arriving to the emergency room.  Patient after having some p.o. intake reported resolution of his nausea.  Patient does not  have any abdominal tenderness present.  No other symptoms concerning for pancreatitis.  Will provide patient with Zofran, and pain medication to keep on hand in case his pain worsens.  Return precautions discussed concern for pancreatitis.  Discussed follow-up with PCP to have repeat lipase.  Patient voices understanding and is in agreement with plan.  Discussed increasing his hydration.  CT head does not show any evidence of mass or other acute findings.  Neurological exam without focal deficits. Patient discussed with Dr. Tomi Bamberger.  Does not recommend additional work-up.  Patient is appropriate for discharge.  Discharged in stable condition.    Lab Tests: -I ordered, reviewed, and interpreted labs.   The pertinent results include:   Labs Reviewed  LIPASE, BLOOD - Abnormal; Notable for the following components:      Result Value   Lipase 408 (*)    All other components within normal limits  COMPREHENSIVE METABOLIC PANEL - Abnormal; Notable for the following components:   Glucose, Bld 111 (*)    ALT 59 (*)    All other components within normal limits  CBC - Abnormal; Notable for the following components:   WBC 3.4 (*)    Platelets 143 (*)    All other components within normal limits  URINALYSIS, ROUTINE W REFLEX MICROSCOPIC  DIFFERENTIAL      EKG  EKG Interpretation  Date/Time:    Ventricular Rate:    PR Interval:     QRS Duration:   QT Interval:    QTC Calculation:   R Axis:     Text Interpretation:           Imaging Studies ordered: I ordered imaging studies including CT head I independently visualized and interpreted imaging. I agree with the radiologist interpretation   Medicines ordered and prescription drug management: Meds ordered this encounter  Medications   ondansetron (ZOFRAN-ODT) disintegrating tablet 4 mg    -I have reviewed the patients home medicines and have made adjustments as needed  Reevaluation: After the interventions noted above, I reevaluated the patient and found that they have :improved Reports improvement in blurry vision.  Reports improvement in nausea.  Still with some left upper extremity tremor.  Co morbidities that complicate the patient evaluation  Past Medical History:  Diagnosis Date   Anxiety    Arrhythmia    Complication of anesthesia    Nausea   [name], recurrent severe, without psychosis ([place]) 08/23/2021   Pneumonia    Rectal cancer ([place]) 11/20/2021   Sleep apnea       Dispostion: Patient is appropriate for discharge.  Discharged in stable condition.  Return precautions discussed.  Final Clinical Impression(s) / ED Diagnoses Final diagnoses:  Tremor  Vision changes  Elevated lipase    Rx / DC Orders ED Discharge Orders          Ordered    ondansetron (ZOFRAN-ODT) 4 MG disintegrating tablet  Every 8 hours PRN        02/02/22 2114    HYDROcodone-acetaminophen (NORCO/VICODIN) 5-325 MG tablet  Every 4 hours PRN        02/02/22 2114              Evlyn Courier, PA-C 02/02/22 2115    Dorie Rank, MD 02/03/22 2041

## 2022-02-02 NOTE — Discharge Instructions (Addendum)
Your work-up today was overall reassuring.  CT scan did not show any concerning cause of your trauma, vision change.  Blood work otherwise showed elevated lipase concerning for pancreatitis.  You did not have any clinical signs of pancreatitis.  If you develop severe abdominal pain, nausea vomiting to the point you are not able to keep any food or drink down please return to the emergency room for evaluation.  Otherwise follow-up with your primary care provider early next week to have repeat lipase to ensure this is improving.  Follow-up with your oncologist.  If you notice any worsening vision change, weakness, facial droop or other signs of stroke please return to the emergency room for evaluation.  I have given you pain medication, nausea medication to keep on hand in case you do develop some abdominal pain

## 2022-02-05 ENCOUNTER — Encounter: Payer: Self-pay | Admitting: Nurse Practitioner

## 2022-02-05 ENCOUNTER — Telehealth: Payer: Self-pay

## 2022-02-05 ENCOUNTER — Inpatient Hospital Stay (HOSPITAL_BASED_OUTPATIENT_CLINIC_OR_DEPARTMENT_OTHER): Payer: BC Managed Care – PPO | Admitting: Nurse Practitioner

## 2022-02-05 VITALS — BP 136/90 | HR 79 | Temp 98.2°F | Resp 18 | Ht 73.0 in | Wt 254.6 lb

## 2022-02-05 DIAGNOSIS — F329 Major depressive disorder, single episode, unspecified: Secondary | ICD-10-CM | POA: Diagnosis not present

## 2022-02-05 DIAGNOSIS — Z452 Encounter for adjustment and management of vascular access device: Secondary | ICD-10-CM | POA: Diagnosis not present

## 2022-02-05 DIAGNOSIS — Z5111 Encounter for antineoplastic chemotherapy: Secondary | ICD-10-CM | POA: Diagnosis not present

## 2022-02-05 DIAGNOSIS — R197 Diarrhea, unspecified: Secondary | ICD-10-CM | POA: Diagnosis not present

## 2022-02-05 DIAGNOSIS — C20 Malignant neoplasm of rectum: Secondary | ICD-10-CM

## 2022-02-05 DIAGNOSIS — G629 Polyneuropathy, unspecified: Secondary | ICD-10-CM | POA: Diagnosis not present

## 2022-02-05 DIAGNOSIS — R21 Rash and other nonspecific skin eruption: Secondary | ICD-10-CM | POA: Diagnosis not present

## 2022-02-05 DIAGNOSIS — H532 Diplopia: Secondary | ICD-10-CM | POA: Diagnosis not present

## 2022-02-05 NOTE — Progress Notes (Signed)
  [facility] OFFICE PROGRESS NOTE   Diagnosis: Rectal cancer  INTERVAL HISTORY:   Billy Roman completed cycle 4 FOLFOX 01/29/2022.  He was seen in the emergency department 02/02/2022 ror evaluation of chills in the absence of fever, left upper extremity tremors, confusion and nausea.  Brain CT returned negative.  Labs unremarkable with the exception of lipase which was elevated at 408.  He was discharged home.  He contacted the office today to report pain in several toes.  He woke up early this morning with "shooting pain" beginning at the left second and third toes.  Toes appeared swollen.  He notes the pain is better since he took a gabapentin and walked around outside.  No significant change in baseline neuropathy symptoms which he describes as numbness.  No known injury.  Objective:  Vital signs in last 24 hours:  Blood pressure (!) 136/90, pulse 79, temperature 98.2 F (36.8 C), temperature source Oral, resp. rate 18, height '6\' 1"'$  (1.854 m), weight 254 lb 9.6 oz (115.5 kg), SpO2 100 %.    Resp: Lungs clear bilaterally. Cardio: Regular rate and rhythm. GI: No hepatomegaly. Vascular: No leg edema.  Specifically no left leg/foot edema.  No skin discoloration legs or feet.  Calves soft and nontender. Neuro: Lower extremity motor strength intact.  Knee DTRs 2+, symmetric. Musculoskeletal: The left foot does not appear edematous.  Able to move toes on the left foot without difficulty. Port-A-Cath without erythema.  Lab Results:  Lab Results  Component Value Date   WBC 3.4 (L) 02/02/2022   HGB 15.2 02/02/2022   HCT 44.8 02/02/2022   MCV 87.7 02/02/2022   PLT 143 (L) 02/02/2022   NEUTROABS 2.4 02/02/2022    Imaging:  No results found.  Medications: I have reviewed the patient's current medications.  Assessment/Plan: Adenocarcinoma of the proximal rectum/distal sigmoid Colonoscopy 11/19/2021-partially obstructing mass at 12-19 cm, biopsy moderate to poorly  differentiated adenocarcinoma Mildly elevated CEA CTs 11/29/2021-mural thickening in the distal sigmoid/proximal rectum with haziness of the mesorectal fat, mesorectal lymphadenopathy, small pulmonary nodules favored to be benign MRI pelvis 12/03/2021-tumor at 10 cm from the anal verge, T3c, approximately 10 metastatic nodes in the mesorectal sheath and presacral space, no extra mesorectal lymphadenopathy, N2 Cycle 1 FOLFOX 12/19/2021 Cycle 2 FOLFOX 01/02/2022, home Decadron prophylaxis added for delayed nausea Cycle 3 FOLFOX 01/15/2022 Cycle 4 FOLFOX 01/29/2022, 5-FU bolus, leucovorin, and oxaliplatin dose reduced secondary to neutropenia and thrombocytopenia Major depressive disorder Change in bowel habits and rectal bleeding secondary to #1 Ongoing tobacco use Report of blurred vision and diplopia Peripheral neuropathy-fingers and left foot  Disposition: Billy Roman appears stable.  He has completed 4 cycles of FOLFOX.  He presents today for evaluation of a "shooting pain" involving several toes on the left foot.  The pain is better.  We discussed the possibility of neuropathy related to oxaliplatin.  He will continue gabapentin.  He will contact the office if the pain persist or he develops any new symptoms.  Next scheduled visit is in 1 week.    Ned Card ANP/GNP-BC   02/05/2022  4:15 PM

## 2022-02-05 NOTE — Telephone Encounter (Signed)
Patient called and stated he went to the [facility] ER on Saturday and he concerns about his symptoms. Patient symptoms are shortness of breath, tremors, pain (2nd/3rd toes are shooting pain to his legs).Patient denied chills, fever, or feeling confusion. I ask the patient was his toes warm and when did the pain started with his toes. The pain started in the middle of the night with his toes. He's toes are swollen and red. I advice the patient to come in for a office visit with Lattie Haw. The patient  agreed to come in. He is schedule to come in tat 315 with [facility].

## 2022-02-10 ENCOUNTER — Other Ambulatory Visit: Payer: Self-pay | Admitting: Oncology

## 2022-02-10 DIAGNOSIS — C20 Malignant neoplasm of rectum: Secondary | ICD-10-CM

## 2022-02-11 ENCOUNTER — Ambulatory Visit: Payer: BC Managed Care – PPO | Attending: Radiation Oncology | Admitting: Physical Therapy

## 2022-02-11 DIAGNOSIS — M6281 Muscle weakness (generalized): Secondary | ICD-10-CM | POA: Diagnosis not present

## 2022-02-11 DIAGNOSIS — C20 Malignant neoplasm of rectum: Secondary | ICD-10-CM | POA: Diagnosis not present

## 2022-02-11 DIAGNOSIS — R293 Abnormal posture: Secondary | ICD-10-CM | POA: Diagnosis not present

## 2022-02-11 DIAGNOSIS — R279 Unspecified lack of coordination: Secondary | ICD-10-CM | POA: Diagnosis not present

## 2022-02-11 NOTE — Therapy (Signed)
OUTPATIENT PHYSICAL THERAPY MALE PELVIC EVALUATION   Patient Name: Billy Roman MRN: [id number] DOB:[birth year], 61 y.o., male Today's Date: 02/11/2022   PT End of Session - 02/11/22 0926     Visit Number 1    Date for PT Re-Evaluation 05/14/22    Authorization Type [employer]    PT Start Time 0930    PT Stop Time 1010    PT Time Calculation (min) 40 min    Activity Tolerance Patient tolerated treatment well    Behavior During Therapy [facility] for tasks assessed/performed             Past Medical History:  Diagnosis Date   Anxiety    Arrhythmia    Complication of anesthesia    Nausea   [name], recurrent severe, without psychosis ([place]) 08/23/2021   Pneumonia    Rectal cancer ([place]) 11/20/2021   Sleep apnea    Past Surgical History:  Procedure Laterality Date   OPERATIVE ULTRASOUND Right 12/17/2021   Procedure: OPERATIVE ULTRASOUND;  Surgeon: Ileana Roup, MD;  Location: [place];  Service: General;  Laterality: Right;   PORTACATH PLACEMENT Right 12/17/2021   Procedure: INSERTION PORT-A-CATH;  Surgeon: Ileana Roup, MD;  Location: [place];  Service: General;  Laterality: Right;   RHINOPLASTY     SEPTOPLASTY     SHOULDER ARTHROSCOPY Left    SHOULDER ARTHROSCOPY WITH OPEN ROTATOR CUFF REPAIR AND DISTAL CLAVICLE ACROMINECTOMY Right 02/01/2021   Procedure: RIGHT SHOULDER ARTHROSCOPY WITH MINI OPEN ROTATOR CUFF REPAIR AND DISTAL CLAVICLE EXCISION;  Surgeon: Thornton Park, MD;  Location: [facility];  Service: Orthopedics;  Laterality: Right;   SPINE SURGERY     lumbar laminectomy   WRIST SURGERY Bilateral    corpal tunnel   Patient Active Problem List   Diagnosis Date Noted   Rectal cancer ([place]) 11/20/2021   Cognitive impairment 09/18/2021   Confusion 09/18/2021   Tremor 09/18/2021   Complaints of memory disturbance 08/24/2021   Tremor of both hands 08/24/2021   H/O rotator cuff surgery 08/24/2021   History of COVID-19 08/24/2021   Smoker  08/24/2021   [name], recurrent severe, without psychosis ([place]) 08/23/2021   Stiffness of right shoulder joint 02/09/2021   Pain in joint of right shoulder 02/09/2021   Vitamin D deficiency 01/17/2021   HNP (herniated nucleus pulposus), lumbar 01/15/2021   Sciatica 01/15/2021   Anxiety and depression 08/23/2009    PCP: Camillia Herter, NP  REFERRING PROVIDER: Hayden Pedro, PA-C   REFERRING DIAG: C20 (ICD-10-CM) - Rectal cancer [facility])  THERAPY DIAG:  Muscle weakness (generalized)  Unspecified lack of coordination  Abnormal posture  Rationale for Evaluation and Treatment Rehabilitation  ONSET DATE: November 15, 2021  SUBJECTIVE:  SUBJECTIVE STATEMENT: Pt still undergoing chemo currently, sometimes has bladder urgency but mostly fecal urgency. Has had one incident of fecal leakage about 1-2 years ago but not since then.  Does have intermittent instances of unsteadiness but has not had falls.    PAIN:  Are you having pain? Yes NPRS scale: 6/10  Pain location:  nerve pain at Lt toes and shooting into Lt leg  Pain type: sharp Pain description: intermittent   Aggravating factors: wakes from sleeping when it happens, but randomly Relieving factors: quickly relieves   PRECAUTIONS: Other: currently has CA  WEIGHT BEARING RESTRICTIONS No  FALLS:  Has patient fallen in last 6 months? No  LIVING ENVIRONMENT: Lives with: lives alone Lives in: House/apartment Stairs: Yes: Internal: split level with several steps up/down steps; one set going upstairs but none downstairs Has following equipment at home: None  OCCUPATION: [employer] employee, needs to sit 8 hours  PLOF: Independent  PATIENT GOALS to keep strength and decrease an incontinence   PERTINENT HISTORY:   1.Adenocarcinoma of the proximal rectum/distal sigmoid .Colonoscopy 11/19/2021-partially obstructing mass at 12-19 cm, biopsy moderate to poorly differentiated adenocarcinoma, Mildly elevated CEA, CTs 11/29/2021-mural thickening in the distal sigmoid/proximal rectum with haziness of the mesorectal fat, mesorectal lymphadenopathy, small pulmonary nodules favored to be benign. MRI pelvis 12/03/2021-tumor at 10 cm from the anal verge, T3c, approximately 10 metastatic nodes in the mesorectal sheath and presacral space, no extra mesorectal lymphadenopathy, N2 Cycle 1 FOLFOX 12/19/2021, Cycle 2 FOLFOX 01/02/2022, home Decadron prophylaxis added for delayed nausea, Cycle 3 FOLFOX 01/15/2022, Cycle 4 FOLFOX 01/29/2022, 5-FU bolus, leucovorin, and oxaliplatin dose reduced secondary to neutropenia and thrombocytopenia 2.Major depressive disorder, 3.Change in bowel habits and rectal bleeding, blurred vision    BOWEL MOVEMENT Pain with bowel movement: Yes Type of bowel movement:Type (Bristol Stool Scale) 1-4, Frequency 1x per day, and Strain Yes (but rare) Fully empty rectum: No Leakage: No Pads: No Fiber supplement: Yes: miralax   URINATION Pain with urination: No Fully empty bladder: Yes:   Stream: Strong and Weak Urgency: Yes: infrequently but does have mild urgency  Frequency: not quicker than every 2 hours  Leakage:  none Pads: No  INTERCOURSE Pain with intercourse:  not active      OBJECTIVE:   DIAGNOSTIC FINDINGS:   COGNITION:  Overall cognitive status: Within functional limits for tasks assessed     SENSATION:  Light touch: Appears intact  Proprioception: Appears intact  MUSCLE LENGTH: Bil Hamstrings and adductors limited by 50%   FUNCTIONAL TESTS:  Single leg stance unable standing on Lt, 5s on Rt without UE support  GAIT: Distance walked: 150' Assistive device utilized: None Level of assistance: Complete Independence Comments: decreased cadence, slightly decreased stride  length, decreased bil step height    POSTURE: rounded shoulders, forward head, and posterior pelvic tilt  LUMBARAROM/PROM  A/PROM A/PROM  eval  Flexion Limited by 75%  Extension Limited by 25%  Right lateral flexion Limited by 75%  Left lateral flexion Limited by 75%  Right rotation Limited by 50%  Left rotation Limited by 50%   (Blank rows = not tested)  LOWER EXTREMITY AROM/PROM:  WFL bil  LOWER EXTREMITY MMT:  Hip abduction 3+/5, extension and adduction 4/5, flexion 3+/5 with pain for testing bil  PALPATION: GENERAL no TTP              External Perineal Exam deferred               Internal Pelvic Floor deferred  Patient confirms identification and approves PT to assess internal pelvic floor and treatment No  PELVIC MMT:   MMT eval  Internal Anal Sphincter   External Anal Sphincter   Puborectalis   Diastasis Recti   (Blank rows = not tested)   TONE: Deferred   TODAY'S TREATMENT  EVAL Examination completed, findings reviewed, pt educated on POC, HEP, and education handouts for energy conservation, bowel control exercises. Pt motivated to participate in PT and agreeable to attempt recommendations.     PATIENT EDUCATION:  Education details: [place] Person educated: Patient Education method: Consulting civil engineer, Demonstration, Corporate treasurer cues, Verbal cues, and Handouts Education comprehension: verbalized understanding and returned demonstration   HOME EXERCISE PROGRAM: [place]  ASSESSMENT:  CLINICAL IMPRESSION: Patient is a 61 y.o. male  who was seen today for physical therapy evaluation and treatment for baseline evaluation pre-radiation treatment and establishing HEP and understanding pelvic floor and bowel/bladder habits and mechanics. Pt found to have decreased core and bil hip strength, decreased standing balance, decreased gait mechanics mildly with decreased cadence and stride length, impaired posture, limited rib mobility with breathing mechanics and noted  chest breathing. Pt reports intermittent need to strain with bowel movements, and does have urgency of bowels mostly and mildly with urine. Pt presenting pre-radiation but during chemo treatments, does endorse his energy levels vary and reports decreased mobility in general due to energy levels. Pt educated on HEP, importance of walking program and exercising as tolerated and male pelvic floor today. Pt agreeable to returning for 1-2 treatments to become I with HEP and strengthening core and hips and balance training as well as education further on voiding mechanics and breathing mechanics and pelvic floor relaxation for post radiation. Then pt to return post radiation for further assessment and needs. Pt would benefit from additional PT to further address deficits.     OBJECTIVE IMPAIRMENTS decreased activity tolerance, decreased balance, decreased coordination, decreased endurance, decreased mobility, decreased strength, increased fascial restrictions, impaired flexibility, and postural dysfunction.   ACTIVITY LIMITATIONS carrying, lifting, bending, squatting, continence, and locomotion level  PARTICIPATION LIMITATIONS: cleaning, interpersonal relationship, community activity, occupation, and yard work  PERSONAL FACTORS Past/current experiences, Time since onset of injury/illness/exacerbation, and 1 comorbidity: medical history  are also affecting patient's functional outcome.   REHAB POTENTIAL: Good  CLINICAL DECISION MAKING: Evolving/moderate complexity  EVALUATION COMPLEXITY: Moderate   GOALS: Goals reviewed with patient? Yes  SHORT TERM GOALS: Target date: 03/11/2022   Pt to be I with HEP.  Baseline: Goal status: INITIAL  2.  Pt will report her BMs are complete due to improved bowel habits and evacuation techniques.  Baseline:  Goal status: INITIAL  3.  Pt to be I with abdominal massage, voiding and breathing mechanics for improved voiding habits. Baseline:  Goal status:  INITIAL  LONG TERM GOALS: Target date:  05/14/22    Pt to be I with advanced HEP.  Baseline:  Goal status: INITIAL  2.  Demonstrate 5xSTS in 11s for decreased fall risk.  Baseline:  Goal status: INITIAL  3.  Pt to demonstrate improved coordination of pelvic floor and breathing mechanics with squatting at least 20# without compensatory strategies for improved pelvic stability without leakage or pain.  Baseline:  Goal status: INITIAL  4.  Pt to demonstrate at least 5/5 bil hip strength for improved pelvic stability and functional squats without leakage.  Baseline:  Goal status: INITIAL  5.  Pt to report no more than 3 bowel movements per day without need of straining for  improved bowel habits post radiation.  Baseline:  Goal status: INITIAL   PLAN: PT FREQUENCY: 1x/week  PT DURATION:  2 sessions then to return post radiation treatment for continued PFPT and reassessment   PLANNED INTERVENTIONS: Therapeutic exercises, Therapeutic activity, Neuromuscular re-education, Patient/Family education, Self Care, Joint mobilization, Aquatic Therapy, Dry Needling, Spinal mobilization, Cryotherapy, Moist heat, scar mobilization, Taping, Biofeedback, and Manual therapy  PLAN FOR NEXT SESSION: review HEP, go over voiding mechanics and breathing mechanics.   Stacy Gardner, PT, DPT 08/28/232:05 PM

## 2022-02-11 NOTE — Patient Instructions (Signed)
Tips for Energy Conservation for Activities of Daily Living Plan ahead to avoid rushing. Sit down to bathe and dry off. Wear a terry robe instead of drying off. Use a shower/bath organizer to decrease leaning and reaching. Use extension handles on sponges and brushes. Install grab rails in the bathroom or use an elevated toilet seat. Lay out clothes and toiletries before dressing. Minimize leaning over to put on clothes and shoes. Bring your foot to your knee to apply socks and shoes. Wear comfortable shoes and low-heeled, slip on shoes. Wear button front shirts rather than pullovers. Housekeeping Schedule household tasks throughout the week. Do housework sitting down when possible. Delegate heavy housework, shopping, laundry and child care when possible. Drag or slide objects rather than lifting. Sit when ironing and take rest periods. Stop working before becoming overly tired. Engineer, water by aisle. Use a grocery cart for support. Shop at less busy times. Ask for help with getting to the car. Meal Preparation Use convenience and easy-to-prepare foods. Use small appliances that take less effort to use. Prepare meals sitting down. Soak dishes instead of scrubbing and let dishes air dry. Prepare double portions and freeze half. Child Care Plan activities that can be done sitting down, such as drawing pictures, playing games, reading, and computer games. Encourage children to climb up onto your lap or into the highchair instead of being lifted. Make a game of the household chores so that children will want to help. Delegate child care when possible.   Ways to get started on an exercise program  Start for 10 minutes per day with a walking program. Work towards 30 minutes of exercise per day When you do an aerobic exercise program start on a low level Water aerobics is a good place due to decreased strain on your joints Begin your exercise program gradually and progress  slowly over time When exercising use correct form. Keep neutral spine Engage abdominals Keep chest up  Chin down Do not lock your knees  Pelvic Floor Exercises for Bowel Control Exercises using both the external anal sphincter and the deep pelvic floor muscles can help you to improve your bowel control. When done correctly, these exercises can tone and strengthen the muscles to help you hold back gas and prevent fecal incontinence (leakage of stool). Exercise programs take time; you may not see any noticeable change in your bowel control immediately.  In some cases it may take several months to regain control. Bowel Control Muscles The anus and the anal canal, has rings of muscle around it. The outer ring of muscle is called the external anal sphincter; it is a voluntary muscle which you can learn to tighten and close more efficiently. When you contract it you will feel the skin around your anus tighten and pull in as if the anus is winking. Try to keep the buttocks muscles relaxed. The inner ring around the anus is the internal anal sphincter. It is an involuntary and automatic muscle; you don't have to think to keep it closed or open.  This muscle should be closed at all times, except when you are actually trying to have a bowel movement.  In addition to the sphincter muscles, there are deeper muscles called the levator ani that form a sling from your tailbone to your pubic bone. The levator ani muscle has a specific part called the puborectalis that holds stool in until you give the signal to relax and empty.  When you contract these muscles it creates  a feeling of lifting the anus inward.   External Anal Sphincter     Levator ani deep layer   Effective Exercises for Control of Gas and Bowels Identify the specific areas of the pelvic floor muscles you need to use.  This can be done using a mirror to see if you are contracting the correct muscles or by placing the pad of your finger at or just  inside the anal opening. Develop an exercise plan for strength, endurance and quick response of the muscles and stick with it.  You must make the muscles do more than they are used to doing. Incorporate the exercises into your daily activities.

## 2022-02-12 ENCOUNTER — Inpatient Hospital Stay: Payer: BC Managed Care – PPO

## 2022-02-12 ENCOUNTER — Other Ambulatory Visit: Payer: Self-pay

## 2022-02-12 ENCOUNTER — Encounter: Payer: Self-pay | Admitting: *Deleted

## 2022-02-12 ENCOUNTER — Inpatient Hospital Stay (HOSPITAL_BASED_OUTPATIENT_CLINIC_OR_DEPARTMENT_OTHER): Payer: BC Managed Care – PPO | Admitting: Oncology

## 2022-02-12 VITALS — BP 132/91 | HR 76 | Temp 98.2°F | Resp 18 | Wt 257.6 lb

## 2022-02-12 VITALS — BP 127/75 | HR 75

## 2022-02-12 DIAGNOSIS — H532 Diplopia: Secondary | ICD-10-CM | POA: Diagnosis not present

## 2022-02-12 DIAGNOSIS — R21 Rash and other nonspecific skin eruption: Secondary | ICD-10-CM | POA: Diagnosis not present

## 2022-02-12 DIAGNOSIS — G629 Polyneuropathy, unspecified: Secondary | ICD-10-CM | POA: Diagnosis not present

## 2022-02-12 DIAGNOSIS — Z5111 Encounter for antineoplastic chemotherapy: Secondary | ICD-10-CM | POA: Diagnosis not present

## 2022-02-12 DIAGNOSIS — C20 Malignant neoplasm of rectum: Secondary | ICD-10-CM | POA: Diagnosis not present

## 2022-02-12 DIAGNOSIS — R197 Diarrhea, unspecified: Secondary | ICD-10-CM | POA: Diagnosis not present

## 2022-02-12 DIAGNOSIS — Z452 Encounter for adjustment and management of vascular access device: Secondary | ICD-10-CM | POA: Diagnosis not present

## 2022-02-12 DIAGNOSIS — F329 Major depressive disorder, single episode, unspecified: Secondary | ICD-10-CM | POA: Diagnosis not present

## 2022-02-12 LAB — CMP (CANCER CENTER ONLY)
ALT: 36 U/L (ref 0–44)
AST: 33 U/L (ref 15–41)
Albumin: 4 g/dL (ref 3.5–5.0)
Alkaline Phosphatase: 58 U/L (ref 38–126)
Anion gap: 9 (ref 5–15)
BUN: 14 mg/dL (ref 6–20)
CO2: 25 mmol/L (ref 22–32)
Calcium: 9.1 mg/dL (ref 8.9–10.3)
Chloride: 104 mmol/L (ref 98–111)
Creatinine: 0.69 mg/dL (ref 0.61–1.24)
GFR, Estimated: >60 mL/min (ref >60)
Glucose, Bld: 109 mg/dL — ABNORMAL HIGH (ref 70–99)
Potassium: 3.8 mmol/L (ref 3.5–5.1)
Sodium: 138 mmol/L (ref 135–145)
Total Bilirubin: 0.5 mg/dL (ref 0.3–1.2)
Total Protein: 6.9 g/dL (ref 6.5–8.1)

## 2022-02-12 LAB — CBC WITH DIFFERENTIAL (CANCER CENTER ONLY)
Abs Immature Granulocytes: 0.02 10*3/uL (ref 0.00–0.07)
Basophils Absolute: 0.1 10*3/uL (ref 0.0–0.1)
Basophils Relative: 2 %
Eosinophils Absolute: 0.4 10*3/uL (ref 0.0–0.5)
Eosinophils Relative: 10 %
HCT: 43.6 % (ref 39.0–52.0)
Hemoglobin: 14.7 g/dL (ref 13.0–17.0)
Immature Granulocytes: 1 %
Lymphocytes Relative: 39 %
Lymphs Abs: 1.6 10*3/uL (ref 0.7–4.0)
MCH: 29.6 pg (ref 26.0–34.0)
MCHC: 33.7 g/dL (ref 30.0–36.0)
MCV: 87.9 fL (ref 80.0–100.0)
Monocytes Absolute: 0.5 10*3/uL (ref 0.1–1.0)
Monocytes Relative: 14 %
Neutro Abs: 1.3 10*3/uL — ABNORMAL LOW (ref 1.7–7.7)
Neutrophils Relative %: 34 %
Platelet Count: 98 10*3/uL — ABNORMAL LOW (ref 150–400)
RBC: 4.96 MIL/uL (ref 4.22–5.81)
RDW: 16 % — ABNORMAL HIGH (ref 11.5–15.5)
WBC Count: 3.9 10*3/uL — ABNORMAL LOW (ref 4.0–10.5)
nRBC: 0 % (ref 0.0–0.2)

## 2022-02-12 MED ORDER — PROCHLORPERAZINE MALEATE 10 MG PO TABS
10.0000 mg | ORAL_TABLET | Freq: Four times a day (QID) | ORAL | 0 refills | Status: AC | PRN
Start: 2022-02-12 — End: ?

## 2022-02-12 MED ORDER — FLUOROURACIL CHEMO INJECTION 2.5 GM/50ML
300.0000 mg/m2 | Freq: Once | INTRAVENOUS | Status: AC
  Administered 2022-02-12: 750 mg via INTRAVENOUS
  Filled 2022-02-12: qty 15

## 2022-02-12 MED ORDER — PALONOSETRON HCL INJECTION 0.25 MG/5ML
0.2500 mg | Freq: Once | INTRAVENOUS | Status: AC
  Administered 2022-02-12: 0.25 mg via INTRAVENOUS
  Filled 2022-02-12: qty 5

## 2022-02-12 MED ORDER — OXALIPLATIN CHEMO INJECTION 100 MG/20ML
150.0000 mg | Freq: Once | INTRAVENOUS | Status: AC
  Administered 2022-02-12: 150 mg via INTRAVENOUS
  Filled 2022-02-12: qty 20

## 2022-02-12 MED ORDER — LEUCOVORIN CALCIUM INJECTION 350 MG
300.0000 mg/m2 | Freq: Once | INTRAVENOUS | Status: AC
  Administered 2022-02-12: 732 mg via INTRAVENOUS
  Filled 2022-02-12: qty 36.6

## 2022-02-12 MED ORDER — SODIUM CHLORIDE 0.9 % IV SOLN
5000.0000 mg | INTRAVENOUS | Status: DC
  Administered 2022-02-12: 5000 mg via INTRAVENOUS
  Filled 2022-02-12: qty 100

## 2022-02-12 MED ORDER — SODIUM CHLORIDE 0.9 % IV SOLN
10.0000 mg | Freq: Once | INTRAVENOUS | Status: AC
  Administered 2022-02-12: 10 mg via INTRAVENOUS
  Filled 2022-02-12: qty 1

## 2022-02-12 MED ORDER — DEXTROSE 5 % IV SOLN: Freq: Once | INTRAVENOUS | Status: AC

## 2022-02-12 NOTE — Patient Instructions (Addendum)
[facility]   Discharge Instructions: Thank you for choosing [facility] to provide your oncology and hematology care.   If you have a lab appointment with the [facility], please go directly to the [facility] and check in at the registration area.   Wear comfortable clothing and clothing appropriate for easy access to any Portacath or PICC line.   We strive to give you quality time with your provider. You may need to reschedule your appointment if you arrive late (15 or more minutes).  Arriving late affects you and other patients whose appointments are after yours.  Also, if you miss three or more appointments without notifying the office, you may be dismissed from the clinic at the provider's discretion.      For prescription refill requests, have your pharmacy contact our office and allow 72 hours for refills to be completed.    Today you received the following chemotherapy and/or immunotherapy agents Oxaliplatin (ELOXATIN), Leucovorin & Flourouracil (ADRUCIL).      To help prevent nausea and vomiting after your treatment, we encourage you to take your nausea medication as directed.  BELOW ARE SYMPTOMS THAT SHOULD BE REPORTED IMMEDIATELY: *FEVER GREATER THAN 100.4 F (38 C) OR HIGHER *CHILLS OR SWEATING *NAUSEA AND VOMITING THAT IS NOT CONTROLLED WITH YOUR NAUSEA MEDICATION *UNUSUAL SHORTNESS OF BREATH *UNUSUAL BRUISING OR BLEEDING *URINARY PROBLEMS (pain or burning when urinating, or frequent urination) *BOWEL PROBLEMS (unusual diarrhea, constipation, pain near the anus) TENDERNESS IN MOUTH AND THROAT WITH OR WITHOUT PRESENCE OF ULCERS (sore throat, sores in mouth, or a toothache) UNUSUAL RASH, SWELLING OR PAIN  UNUSUAL VAGINAL DISCHARGE OR ITCHING   Items with * indicate a potential emergency and should be followed up as soon as possible or go to the Emergency Department if any problems should occur.  Please show the CHEMOTHERAPY ALERT  CARD or IMMUNOTHERAPY ALERT CARD at check-in to the Emergency Department and triage nurse.  Should you have questions after your visit or need to cancel or reschedule your appointment, please contact [facility]  Dept: [phone]  and follow the prompts.  Office hours are 8:00 a.m. to 4:30 p.m. Monday - Friday. Please note that voicemails left after 4:00 p.m. may not be returned until the following business day.  We are closed weekends and major holidays. You have access to a nurse at all times for urgent questions. Please call the main number to the clinic Dept: [phone] and follow the prompts.   For any non-urgent questions, you may also contact your provider using MyChart. We now offer e-Visits for anyone 68 and older to request care online for non-urgent symptoms. For details visit [email or web address].   Also download the MyChart app! Go to the app store, search "MyChart", open the app, select , and log in with your MyChart username and password.  Masks are optional in the cancer centers. If you would like for your care team to wear a mask while they are taking care of you, please let them know. You may have one support person who is at least 59 years old accompany you for your appointments.  Oxaliplatin Injection What is this medication? OXALIPLATIN (ox AL i PLA tin) treats some types of cancer. It works by slowing down the growth of cancer cells. This medicine may be used for other purposes; ask your health care provider or pharmacist if you have questions. COMMON BRAND NAME(S): Eloxatin What should I tell my  care team before I take this medication? They need to know if you have any of these conditions: Heart disease History of irregular heartbeat or rhythm Liver disease Low blood cell levels (white cells, red cells, and platelets) Lung or breathing disease, such as asthma Take medications that treat or prevent blood clots Tingling of the  fingers, toes, or other nerve disorder An unusual or allergic reaction to oxaliplatin, other medications, foods, dyes, or preservatives If you or your partner are pregnant or trying to get pregnant Breast-feeding How should I use this medication? This medication is injected into a vein. It is given by your care team in a hospital or clinic setting. Talk to your care team about the use of this medication in children. Special care may be needed. Overdosage: If you think you have taken too much of this medicine contact a poison control center or emergency room at once. NOTE: This medicine is only for you. Do not share this medicine with others. What if I miss a dose? Keep appointments for follow-up doses. It is important not to miss a dose. Call your care team if you are unable to keep an appointment. What may interact with this medication? Do not take this medication with any of the following: Cisapride Dronedarone Pimozide Thioridazine This medication may also interact with the following: Aspirin and aspirin-like medications Certain medications that treat or prevent blood clots, such as warfarin, apixaban, dabigatran, and rivaroxaban Cisplatin Cyclosporine Diuretics Medications for infection, such as acyclovir, adefovir, amphotericin B, bacitracin, cidofovir, foscarnet, ganciclovir, gentamicin, pentamidine, vancomycin NSAIDs, medications for pain and inflammation, such as ibuprofen or naproxen Other medications that cause heart rhythm changes Pamidronate Zoledronic acid This list may not describe all possible interactions. Give your health care provider a list of all the medicines, herbs, non-prescription drugs, or dietary supplements you use. Also tell them if you smoke, drink alcohol, or use illegal drugs. Some items may interact with your medicine. What should I watch for while using this medication? Your condition will be monitored carefully while you are receiving this  medication. You may need blood work while taking this medication. This medication may make you feel generally unwell. This is not uncommon as chemotherapy can affect healthy cells as well as cancer cells. Report any side effects. Continue your course of treatment even though you feel ill unless your care team tells you to stop. This medication may increase your risk of getting an infection. Call your care team for advice if you get a fever, chills, sore throat, or other symptoms of a cold or flu. Do not treat yourself. Try to avoid being around people who are sick. Avoid taking medications that contain aspirin, acetaminophen, ibuprofen, naproxen, or ketoprofen unless instructed by your care team. These medications may hide a fever. Be careful brushing or flossing your teeth or using a toothpick because you may get an infection or bleed more easily. If you have any dental work done, tell your dentist you are receiving this medication. This medication can make you more sensitive to cold. Do not drink cold drinks or use ice. Cover exposed skin before coming in contact with cold temperatures or cold objects. When out in cold weather wear warm clothing and cover your mouth and nose to warm the air that goes into your lungs. Tell your care team if you get sensitive to the cold. Talk to your care team if you or your partner are pregnant or think either of you might be pregnant. This medication can  cause serious birth defects if taken during pregnancy and for 9 months after the last dose. A negative pregnancy test is required before starting this medication. A reliable form of contraception is recommended while taking this medication and for 9 months after the last dose. Talk to your care team about effective forms of contraception. Do not father a child while taking this medication and for 6 months after the last dose. Use a condom while having sex during this time period. Do not breastfeed while taking this  medication and for 3 months after the last dose. This medication may cause infertility. Talk to your care team if you are concerned about your fertility. What side effects may I notice from receiving this medication? Side effects that you should report to your care team as soon as possible: Allergic reactions--skin rash, itching, hives, swelling of the face, lips, tongue, or throat Bleeding--bloody or black, tar-like stools, vomiting blood or brown material that looks like coffee grounds, red or dark brown urine, small red or purple spots on skin, unusual bruising or bleeding Dry cough, shortness of breath or trouble breathing Heart rhythm changes--fast or irregular heartbeat, dizziness, feeling faint or lightheaded, chest pain, trouble breathing Infection--fever, chills, cough, sore throat, wounds that don't heal, pain or trouble when passing urine, general feeling of discomfort or being unwell Liver injury--right upper belly pain, loss of appetite, nausea, light-colored stool, dark yellow or brown urine, yellowing skin or eyes, unusual weakness or fatigue Low red blood cell level--unusual weakness or fatigue, dizziness, headache, trouble breathing Muscle injury--unusual weakness or fatigue, muscle pain, dark yellow or brown urine, decrease in amount of urine Pain, tingling, or numbness in the hands or feet Sudden and severe headache, confusion, change in vision, seizures, which may be signs of posterior reversible encephalopathy syndrome (PRES) Unusual bruising or bleeding Side effects that usually do not require medical attention (report to your care team if they continue or are bothersome): Diarrhea Nausea Pain, redness, or swelling with sores inside the mouth or throat Unusual weakness or fatigue Vomiting This list may not describe all possible side effects. Call your doctor for medical advice about side effects. You may report side effects to FDA at 1-800-FDA-1088. Where should I keep my  medication? This medication is given in a hospital or clinic. It will not be stored at home. NOTE: This sheet is a summary. It may not cover all possible information. If you have questions about this medicine, talk to your doctor, pharmacist, or health care provider.  2023 Elsevier/Gold Standard (2021-09-28 00:00:00)  Leucovorin Injection What is this medication? LEUCOVORIN (loo koe VOR in) prevents side effects from certain medications, such as methotrexate. It works by increasing folate levels. This helps protect healthy cells in your body. It may also be used to treat anemia caused by low levels of folate. It can also be used with fluorouracil, a type of chemotherapy, to treat colorectal cancer. It works by increasing the effects of fluorouracil in the body. This medicine may be used for other purposes; ask your health care provider or pharmacist if you have questions. What should I tell my care team before I take this medication? They need to know if you have any of these conditions: Anemia from low levels of vitamin B12 in the blood An unusual or allergic reaction to leucovorin, folic acid, other medications, foods, dyes, or preservatives Pregnant or trying to get pregnant Breastfeeding How should I use this medication? This medication is injected into a vein or a  muscle. It is given by your care team in a hospital or clinic setting. Talk to your care team about the use of this medication in children. Special care may be needed. Overdosage: If you think you have taken too much of this medicine contact a poison control center or emergency room at once. NOTE: This medicine is only for you. Do not share this medicine with others. What if I miss a dose? Keep appointments for follow-up doses. It is important not to miss your dose. Call your care team if you are unable to keep an appointment. What may interact with this  medication? Capecitabine Fluorouracil Phenobarbital Phenytoin Primidone Trimethoprim;sulfamethoxazole This list may not describe all possible interactions. Give your health care provider a list of all the medicines, herbs, non-prescription drugs, or dietary supplements you use. Also tell them if you smoke, drink alcohol, or use illegal drugs. Some items may interact with your medicine. What should I watch for while using this medication? Your condition will be monitored carefully while you are receiving this medication. This medication may increase the side effects of 5-fluorouracil. Tell your care team if you have diarrhea or mouth sores that do not get better or that get worse. What side effects may I notice from receiving this medication? Side effects that you should report to your care team as soon as possible: Allergic reactions--skin rash, itching, hives, swelling of the face, lips, tongue, or throat This list may not describe all possible side effects. Call your doctor for medical advice about side effects. You may report side effects to FDA at 1-800-FDA-1088. Where should I keep my medication? This medication is given in a hospital or clinic. It will not be stored at home. NOTE: This sheet is a summary. It may not cover all possible information. If you have questions about this medicine, talk to your doctor, pharmacist, or health care provider.  2023 Elsevier/Gold Standard (2021-10-12 00:00:00)  Fluorouracil Injection What is this medication? FLUOROURACIL (flure oh YOOR a sil) treats some types of cancer. It works by slowing down the growth of cancer cells. This medicine may be used for other purposes; ask your health care provider or pharmacist if you have questions. COMMON BRAND NAME(S): Adrucil What should I tell my care team before I take this medication? They need to know if you have any of these conditions: Blood disorders Dihydropyrimidine dehydrogenase (DPD)  deficiency Infection, such as chickenpox, cold sores, herpes Kidney disease Liver disease Poor nutrition Recent or ongoing radiation therapy An unusual or allergic reaction to fluorouracil, other medications, foods, dyes, or preservatives If you or your partner are pregnant or trying to get pregnant Breast-feeding How should I use this medication? This medication is injected into a vein. It is administered by your care team in a hospital or clinic setting. Talk to your care team about the use of this medication in children. Special care may be needed. Overdosage: If you think you have taken too much of this medicine contact a poison control center or emergency room at once. NOTE: This medicine is only for you. Do not share this medicine with others. What if I miss a dose? Keep appointments for follow-up doses. It is important not to miss your dose. Call your care team if you are unable to keep an appointment. What may interact with this medication? Do not take this medication with any of the following: Live virus vaccines This medication may also interact with the following: Medications that treat or prevent blood clots, such  as warfarin, enoxaparin, dalteparin This list may not describe all possible interactions. Give your health care provider a list of all the medicines, herbs, non-prescription drugs, or dietary supplements you use. Also tell them if you smoke, drink alcohol, or use illegal drugs. Some items may interact with your medicine. What should I watch for while using this medication? Your condition will be monitored carefully while you are receiving this medication. This medication may make you feel generally unwell. This is not uncommon as chemotherapy can affect healthy cells as well as cancer cells. Report any side effects. Continue your course of treatment even though you feel ill unless your care team tells you to stop. In some cases, you may be given additional medications  to help with side effects. Follow all directions for their use. This medication may increase your risk of getting an infection. Call your care team for advice if you get a fever, chills, sore throat, or other symptoms of a cold or flu. Do not treat yourself. Try to avoid being around people who are sick. This medication may increase your risk to bruise or bleed. Call your care team if you notice any unusual bleeding. Be careful brushing or flossing your teeth or using a toothpick because you may get an infection or bleed more easily. If you have any dental work done, tell your dentist you are receiving this medication. Avoid taking medications that contain aspirin, acetaminophen, ibuprofen, naproxen, or ketoprofen unless instructed by your care team. These medications may hide a fever. Do not treat diarrhea with over the counter products. Contact your care team if you have diarrhea that lasts more than 2 days or if it is severe and watery. This medication can make you more sensitive to the sun. Keep out of the sun. If you cannot avoid being in the sun, wear protective clothing and sunscreen. Do not use sun lamps, tanning beds, or tanning booths. Talk to your care team if you or your partner wish to become pregnant or think you might be pregnant. This medication can cause serious birth defects if taken during pregnancy and for 3 months after the last dose. A reliable form of contraception is recommended while taking this medication and for 3 months after the last dose. Talk to your care team about effective forms of contraception. Do not father a child while taking this medication and for 3 months after the last dose. Use a condom while having sex during this time period. Do not breastfeed while taking this medication. This medication may cause infertility. Talk to your care team if you are concerned about your fertility. What side effects may I notice from receiving this medication? Side effects that you  should report to your care team as soon as possible: Allergic reactions--skin rash, itching, hives, swelling of the face, lips, tongue, or throat Heart attack--pain or tightness in the chest, shoulders, arms, or jaw, nausea, shortness of breath, cold or clammy skin, feeling faint or lightheaded Heart failure--shortness of breath, swelling of the ankles, feet, or hands, sudden weight gain, unusual weakness or fatigue Heart rhythm changes--fast or irregular heartbeat, dizziness, feeling faint or lightheaded, chest pain, trouble breathing High ammonia level--unusual weakness or fatigue, confusion, loss of appetite, nausea, vomiting, seizures Infection--fever, chills, cough, sore throat, wounds that don't heal, pain or trouble when passing urine, general feeling of discomfort or being unwell Low red blood cell level--unusual weakness or fatigue, dizziness, headache, trouble breathing Pain, tingling, or numbness in the hands or feet, muscle weakness,  change in vision, confusion or trouble speaking, loss of balance or coordination, trouble walking, seizures Redness, swelling, and blistering of the skin over hands and feet Severe or prolonged diarrhea Unusual bruising or bleeding Side effects that usually do not require medical attention (report to your care team if they continue or are bothersome): Dry skin Headache Increased tears Nausea Pain, redness, or swelling with sores inside the mouth or throat Sensitivity to light Vomiting This list may not describe all possible side effects. Call your doctor for medical advice about side effects. You may report side effects to FDA at 1-800-FDA-1088. Where should I keep my medication? This medication is given in a hospital or clinic. It will not be stored at home. NOTE: This sheet is a summary. It may not cover all possible information. If you have questions about this medicine, talk to your doctor, pharmacist, or health care provider.  2023 Elsevier/Gold  Standard (2021-10-09 00:00:00)  The chemotherapy medication bag should finish at 46 hours, 96 hours, or 7 days. For example, if your pump is scheduled for 46 hours and it was put on at 4:00 p.m., it should finish at 2:00 p.m. the day it is scheduled to come off regardless of your appointment time.     Estimated time to finish at on 12pm Thursday 02/14/2022.   If the display on your pump reads "Low Volume" and it is beeping, take the batteries out of the pump and come to the cancer center for it to be taken off.   If the pump alarms go off prior to the pump reading "Low Volume" then call [phone] and someone can assist you.  If the plunger comes out and the chemotherapy medication is leaking out, please use your home chemo spill kit to clean up the spill. Do NOT use paper towels or other household products.  If you have problems or questions regarding your pump, please call either [phone] (24 hours a day) or the cancer center Monday-Friday 8:00 a.m.- 4:30 p.m. at the clinic number and we will assist you. If you are unable to get assistance, then go to the nearest Emergency Department and ask the staff to contact the IV team for assistance.

## 2022-02-12 NOTE — Patient Instructions (Addendum)

## 2022-02-12 NOTE — Progress Notes (Signed)
Patient seen by Dr. Benay Spice today  Vitals are within treatment parameters. MD aware of DBP 91 Labs reviewed by Dr. Benay Spice and are not all within treatment parameters. ANC 1.3 and platelets 98,000 --OK to proceed with treatment. Will receive Udenyca day 3.  Per physician team, patient is ready for treatment and there are NO modifications to the treatment plan.

## 2022-02-12 NOTE — Progress Notes (Signed)
  [place] OFFICE PROGRESS NOTE   Diagnosis: Rectal cancer  INTERVAL HISTORY:   Mr. Heslin completed another cycle of FOLFOX on 01/29/2022.  He reports mild diarrhea for a few days following chemotherapy.  He had nausea for several days, but no emesis.  He reports mild worsening of pre-existing neuropathy symptoms in the feet.  He has mild numbness in the hands.  He continues to have rectal discomfort.  His stool caliber has returned to normal.  Objective:  Vital signs in last 24 hours:  Blood pressure (!) 132/91, pulse 76, temperature 98.2 F (36.8 C), temperature source Skin, resp. rate 18, weight 257 lb 9.6 oz (116.8 kg), SpO2 98 %.    HEENT: No thrush or ulcers Resp: Lungs clear bilaterally Cardio: Regular rate and rhythm GI: No hepatosplenomegaly Vascular: No leg edema  Skin: Erythematous slightly raised rash over the forearm bilaterally and upper anterior chest Neurologic: Mild loss of vibratory sense at the fingertips bilaterally  Portacath/PICC-without erythema  Lab Results:  Lab Results  Component Value Date   WBC 3.9 (L) 02/12/2022   HGB 14.7 02/12/2022   HCT 43.6 02/12/2022   MCV 87.9 02/12/2022   PLT 98 (L) 02/12/2022   NEUTROABS 1.3 (L) 02/12/2022    CMP  Lab Results  Component Value Date   NA 138 02/12/2022   K 3.8 02/12/2022   CL 104 02/12/2022   CO2 25 02/12/2022   GLUCOSE 109 (H) 02/12/2022   BUN 14 02/12/2022   CREATININE 0.69 02/12/2022   CALCIUM 9.1 02/12/2022   PROT 6.9 02/12/2022   ALBUMIN 4.0 02/12/2022   AST 33 02/12/2022   ALT 36 02/12/2022   ALKPHOS 58 02/12/2022   BILITOT 0.5 02/12/2022   GFRNONAA >60 02/12/2022   GFRAA 117 07/26/2019    Lab Results  Component Value Date   CEA 3.65 12/14/2021    Medications: I have reviewed the patient's current medications.   Assessment/Plan:  Adenocarcinoma of the proximal rectum/distal sigmoid Colonoscopy 11/19/2021-partially obstructing mass at 12-19 cm, biopsy  moderate to poorly differentiated adenocarcinoma Mildly elevated CEA CTs 11/29/2021-mural thickening in the distal sigmoid/proximal rectum with haziness of the mesorectal fat, mesorectal lymphadenopathy, small pulmonary nodules favored to be benign MRI pelvis 12/03/2021-tumor at 10 cm from the anal verge, T3c, approximately 10 metastatic nodes in the mesorectal sheath and presacral space, no extra mesorectal lymphadenopathy, N2 Cycle 1 FOLFOX 12/19/2021 Cycle 2 FOLFOX 01/02/2022, home Decadron prophylaxis added for delayed nausea Cycle 3 FOLFOX 01/15/2022 Cycle 4 FOLFOX 01/29/2022, 5-FU bolus, leucovorin, and oxaliplatin dose reduced secondary to neutropenia and thrombocytopenia Cycle 5 FOLFOX 02/12/2022, Udenyca Major depressive disorder Change in bowel habits and rectal bleeding secondary to #1 Ongoing tobacco use Report of blurred vision and diplopia Peripheral neuropathy-fingers and left foot   Disposition: Mr. Beedle has completed 4 cycles of FOLFOX.  He has tolerated the chemotherapy well.  He has mild neutropenia and thrombocytopenia today.  He will continue chemotherapy at the current dose reduction.  He will receive G-CSF with this cycle of chemotherapy.  He knows to call for a fever or bleeding.  He will return for an office visit and chemotherapy in 2 weeks.  We will hold oxaliplatin if the platelet count returns at less than 75,000 when he is here in 2 weeks.  [name], MD  02/12/2022  9:48 AM

## 2022-02-13 ENCOUNTER — Other Ambulatory Visit: Payer: Self-pay

## 2022-02-14 ENCOUNTER — Inpatient Hospital Stay: Payer: BC Managed Care – PPO

## 2022-02-14 ENCOUNTER — Other Ambulatory Visit: Payer: Self-pay | Admitting: Nurse Practitioner

## 2022-02-14 VITALS — BP 134/81 | HR 75 | Temp 98.2°F | Resp 18

## 2022-02-14 DIAGNOSIS — C20 Malignant neoplasm of rectum: Secondary | ICD-10-CM

## 2022-02-14 DIAGNOSIS — Z452 Encounter for adjustment and management of vascular access device: Secondary | ICD-10-CM | POA: Diagnosis not present

## 2022-02-14 DIAGNOSIS — R21 Rash and other nonspecific skin eruption: Secondary | ICD-10-CM | POA: Diagnosis not present

## 2022-02-14 DIAGNOSIS — G629 Polyneuropathy, unspecified: Secondary | ICD-10-CM | POA: Diagnosis not present

## 2022-02-14 DIAGNOSIS — Z5111 Encounter for antineoplastic chemotherapy: Secondary | ICD-10-CM | POA: Diagnosis not present

## 2022-02-14 DIAGNOSIS — F329 Major depressive disorder, single episode, unspecified: Secondary | ICD-10-CM | POA: Diagnosis not present

## 2022-02-14 DIAGNOSIS — R197 Diarrhea, unspecified: Secondary | ICD-10-CM | POA: Diagnosis not present

## 2022-02-14 DIAGNOSIS — H532 Diplopia: Secondary | ICD-10-CM | POA: Diagnosis not present

## 2022-02-14 MED ORDER — HEPARIN SOD (PORK) LOCK FLUSH 100 UNIT/ML IV SOLN
500.0000 [IU] | Freq: Once | INTRAVENOUS | Status: AC | PRN
  Administered 2022-02-14: 500 [IU]

## 2022-02-14 MED ORDER — PEGFILGRASTIM-CBQV 6 MG/0.6ML ~~LOC~~ SOSY
6.0000 mg | PREFILLED_SYRINGE | Freq: Once | SUBCUTANEOUS | Status: AC
  Administered 2022-02-14: 6 mg via SUBCUTANEOUS
  Filled 2022-02-14: qty 0.6

## 2022-02-14 MED ORDER — HYDROCODONE-ACETAMINOPHEN 5-325 MG PO TABS: 1.0000 | ORAL_TABLET | Freq: Four times a day (QID) | ORAL | 0 refills | Status: DC | PRN

## 2022-02-14 MED ORDER — SODIUM CHLORIDE 0.9% FLUSH
10.0000 mL | INTRAVENOUS | Status: DC | PRN
  Administered 2022-02-14: 10 mL

## 2022-02-14 NOTE — Patient Instructions (Signed)
Implanted Port Home Guide An implanted port is a device that is placed under the skin. It is usually placed in the chest. The device may vary based on the need. Implanted ports can be used to give IV medicine, to take blood, or to give fluids. You may have an implanted port if: You need IV medicine that would be irritating to the small veins in your hands or arms. You need IV medicines, such as chemotherapy, for a long period of time. You need IV nutrition for a long period of time. You may have fewer limitations when using a port than you would if you used other types of long-term IVs. You will also likely be able to return to normal activities after your incision heals. An implanted port has two main parts: Reservoir. The reservoir is the part where a needle is inserted to give medicines or draw blood. The reservoir is round. After the port is placed, it appears as a small, raised area under your skin. Catheter. The catheter is a small, thin tube that connects the reservoir to a vein. Medicine that is inserted into the reservoir goes into the catheter and then into the vein. How is my port accessed? To access your port: A numbing cream may be placed on the skin over the port site. Your health care provider will put on a mask and sterile gloves. The skin over your port will be cleaned carefully with a germ-killing soap and allowed to dry. Your health care provider will gently pinch the port and insert a needle into it. Your health care provider will check for a blood return to make sure the port is in the vein and is still working (patent). If your port needs to remain accessed to get medicine continuously (constant infusion), your health care provider will place a clear bandage (dressing) over the needle site. The dressing and needle will need to be changed every week, or as told by your health care provider. What is flushing? Flushing helps keep the port working. Follow instructions from your  health care provider about how and when to flush the port. Ports are usually flushed with saline solution or a medicine called heparin. The need for flushing will depend on how the port is used: If the port is only used from time to time to give medicines or draw blood, the port may need to be flushed: Before and after medicines have been given. Before and after blood has been drawn. As part of routine maintenance. Flushing may be recommended every 4-6 weeks. If a constant infusion is running, the port may not need to be flushed. Throw away any syringes in a disposal container that is meant for sharp items (sharps container). You can buy a sharps container from a pharmacy, or you can make one by using an empty hard plastic bottle with a cover. How long will my port stay implanted? The port can stay in for as long as your health care provider thinks it is needed. When it is time for the port to come out, a surgery will be done to remove it. The surgery will be similar to the procedure that was done to put the port in. Follow these instructions at home: Caring for your port and port site Flush your port as told by your health care provider. If you need an infusion over several days, follow instructions from your health care provider about how to take care of your port site. Make sure you: Change your   dressing as told by your health care provider. Wash your hands with soap and water for at least 20 seconds before and after you change your dressing. If soap and water are not available, use alcohol-based hand sanitizer. Place any used dressings or infusion bags into a plastic bag. Throw that bag in the trash. Keep the dressing that covers the needle clean and dry. Do not get it wet. Do not use scissors or sharp objects near the infusion tubing. Keep any external tubes clamped, unless they are being used. Check your port site every day for signs of infection. Check for: Redness, swelling, or  pain. Fluid or blood. Warmth. Pus or a bad smell. Protect the skin around the port site. Avoid wearing bra straps that rub or irritate the site. Protect the skin around your port from seat belts. Place a soft pad over your chest if needed. Bathe or shower as told by your health care provider. The site may get wet as long as you are not actively receiving an infusion. General instructions  Return to your normal activities as told by your health care provider. Ask your health care provider what activities are safe for you. Carry a medical alert card or wear a medical alert bracelet at all times. This will let health care providers know that you have an implanted port in case of an emergency. Where to find more information American Cancer Society: www.cancer.org American Society of Clinical Oncology: www.cancer.net Contact a health care provider if: You have a fever or chills. You have redness, swelling, or pain at the port site. You have fluid or blood coming from your port site. Your incision feels warm to the touch. You have pus or a bad smell coming from the port site. Summary Implanted ports are usually placed in the chest for long-term IV access. Follow instructions from your health care provider about flushing the port and changing bandages (dressings). Take care of the area around your port by avoiding clothing that puts pressure on the area, and by watching for signs of infection. Protect the skin around your port from seat belts. Place a soft pad over your chest if needed. Contact a health care provider if you have a fever or you have redness, swelling, pain, fluid, or a bad smell at the port site. This information is not intended to replace advice given to you by your health care provider. Make sure you discuss any questions you have with your health care provider. Document Revised: 12/05/2020 Document Reviewed: 12/05/2020 Elsevier Patient Education  2023 [employer]  Pegfilgrastim Injection What is this medication? PEGFILGRASTIM (PEG fil gra stim) lowers the risk of infection in people who are receiving chemotherapy. It works by helping your body make more white blood cells, which protects your body from infection. It may also be used to help people who have been exposed to high doses of radiation. This medicine may be used for other purposes; ask your health care provider or pharmacist if you have questions. COMMON BRAND NAME(S): Fulphila, Fylnetra, Neulasta, Nyvepria, Stimufend, UDENYCA, Ziextenzo What should I tell my care team before I take this medication? They need to know if you have any of these conditions: Kidney disease Latex allergy Ongoing radiation therapy Sickle cell disease Skin reactions to acrylic adhesives (On-Body Injector only) An unusual or allergic reaction to pegfilgrastim, filgrastim, other medications, foods, dyes, or preservatives Pregnant or trying to get pregnant Breast-feeding How should I use this medication? This medication is for injection under the   skin. If you get this medication at home, you will be taught how to prepare and give the pre-filled syringe or how to use the On-body Injector. Refer to the patient Instructions for Use for detailed instructions. Use exactly as directed. Tell your care team immediately if you suspect that the On-body Injector may not have performed as intended or if you suspect the use of the On-body Injector resulted in a missed or partial dose. It is important that you put your used needles and syringes in a special sharps container. Do not put them in a trash can. If you do not have a sharps container, call your pharmacist or care team to get one. Talk to your care team about the use of this medication in children. While this medication may be prescribed for selected conditions, precautions do apply. Overdosage: If you think you have taken too much of this medicine contact a poison control  center or emergency room at once. NOTE: This medicine is only for you. Do not share this medicine with others. What if I miss a dose? It is important not to miss your dose. Call your care team if you miss your dose. If you miss a dose due to an On-body Injector failure or leakage, a new dose should be administered as soon as possible using a single prefilled syringe for manual use. What may interact with this medication? Interactions have not been studied. This list may not describe all possible interactions. Give your health care provider a list of all the medicines, herbs, non-prescription drugs, or dietary supplements you use. Also tell them if you smoke, drink alcohol, or use illegal drugs. Some items may interact with your medicine. What should I watch for while using this medication? Your condition will be monitored carefully while you are receiving this medication. You may need blood work done while you are taking this medication. Talk to your care team about your risk of cancer. You may be more at risk for certain types of cancer if you take this medication. If you are going to need a MRI, CT scan, or other procedure, tell your care team that you are using this medication (On-Body Injector only). What side effects may I notice from receiving this medication? Side effects that you should report to your care team as soon as possible: Allergic reactions--skin rash, itching, hives, swelling of the face, lips, tongue, or throat Capillary leak syndrome--stomach or muscle pain, unusual weakness or fatigue, feeling faint or lightheaded, decrease in the amount of urine, swelling of the ankles, hands, or feet, trouble breathing High white blood cell level--fever, fatigue, trouble breathing, night sweats, change in vision, weight loss Inflammation of the aorta--fever, fatigue, back, chest, or stomach pain, severe headache Kidney injury (glomerulonephritis)--decrease in the amount of urine, red or dark  brown urine, foamy or bubbly urine, swelling of the ankles, hands, or feet Shortness of breath or trouble breathing Spleen injury--pain in upper left stomach or shoulder Unusual bruising or bleeding Side effects that usually do not require medical attention (report to your care team if they continue or are bothersome): Bone pain Pain in the hands or feet This list may not describe all possible side effects. Call your doctor for medical advice about side effects. You may report side effects to FDA at 1-800-FDA-1088. Where should I keep my medication? Keep out of the reach of children. If you are using this medication at home, you will be instructed on how to store it. Throw away any unused   medication after the expiration date on the label. NOTE: This sheet is a summary. It may not cover all possible information. If you have questions about this medicine, talk to your doctor, pharmacist, or health care provider.  2023 Elsevier/Gold Standard (2013-09-03 00:00:00)

## 2022-02-19 DIAGNOSIS — C20 Malignant neoplasm of rectum: Secondary | ICD-10-CM | POA: Diagnosis not present

## 2022-02-25 ENCOUNTER — Ambulatory Visit: Payer: BC Managed Care – PPO | Attending: Radiation Oncology | Admitting: Physical Therapy

## 2022-02-25 DIAGNOSIS — R279 Unspecified lack of coordination: Secondary | ICD-10-CM | POA: Diagnosis present

## 2022-02-25 DIAGNOSIS — M6281 Muscle weakness (generalized): Secondary | ICD-10-CM | POA: Insufficient documentation

## 2022-02-25 DIAGNOSIS — R293 Abnormal posture: Secondary | ICD-10-CM | POA: Diagnosis present

## 2022-02-25 NOTE — Therapy (Addendum)
OUTPATIENT PHYSICAL THERAPY MALE PELVIC TREATMENT   Patient Name: Billy Roman MRN: [id number] DOB:20-Feb-1961, 61 y.o., male Today's Date: 02/25/2022   PT End of Session - 02/25/22 1607     Visit Number 2    Date for PT Re-Evaluation 05/14/22    Authorization Type [employer]    PT Start Time 1615    PT Stop Time 1653    PT Time Calculation (min) 38 min    Activity Tolerance Patient tolerated treatment well    Behavior During Therapy WFL for tasks assessed/performed             Past Medical History:  Diagnosis Date   Anxiety    Arrhythmia    Complication of anesthesia    Nausea   [name], recurrent severe, without psychosis (HCC) 08/23/2021   Pneumonia    Rectal cancer (HCC) 11/20/2021   Sleep apnea    Past Surgical History:  Procedure Laterality Date   OPERATIVE ULTRASOUND Right 12/17/2021   Procedure: OPERATIVE ULTRASOUND;  Surgeon: Andria Meuse, MD;  Location: [facility] OR;  Service: General;  Laterality: Right;   PORTACATH PLACEMENT Right 12/17/2021   Procedure: INSERTION PORT-A-CATH;  Surgeon: Andria Meuse, MD;  Location: [facility] OR;  Service: General;  Laterality: Right;   RHINOPLASTY     SEPTOPLASTY     SHOULDER ARTHROSCOPY Left    SHOULDER ARTHROSCOPY WITH OPEN ROTATOR CUFF REPAIR AND DISTAL CLAVICLE ACROMINECTOMY Right 02/01/2021   Procedure: RIGHT SHOULDER ARTHROSCOPY WITH MINI OPEN ROTATOR CUFF REPAIR AND DISTAL CLAVICLE EXCISION;  Surgeon: Juanell Fairly, MD;  Location: [facility] ORS;  Service: Orthopedics;  Laterality: Right;   SPINE SURGERY     lumbar laminectomy   WRIST SURGERY Bilateral    corpal tunnel   Patient Active Problem List   Diagnosis Date Noted   Rectal cancer (HCC) 11/20/2021   Cognitive impairment 09/18/2021   Confusion 09/18/2021   Tremor 09/18/2021   Complaints of memory disturbance 08/24/2021   Tremor of both hands 08/24/2021   H/O rotator cuff surgery 08/24/2021   History of COVID-19 08/24/2021   Smoker  08/24/2021   [name], recurrent severe, without psychosis (HCC) 08/23/2021   Stiffness of right shoulder joint 02/09/2021   Pain in joint of right shoulder 02/09/2021   Vitamin D deficiency 01/17/2021   HNP (herniated nucleus pulposus), lumbar 01/15/2021   Sciatica 01/15/2021   Anxiety and depression 08/23/2009    PCP: Rema Fendt, NP  REFERRING PROVIDER: Ronny Bacon, PA-C   REFERRING DIAG: C20 (ICD-10-CM) - Rectal cancer [facility])  THERAPY DIAG:  Muscle weakness (generalized)  Unspecified lack of coordination  Abnormal posture  Rationale for Evaluation and Treatment Rehabilitation  ONSET DATE: November 15, 2021  SUBJECTIVE:  SUBJECTIVE STATEMENT: Pt still undergoing chemo currently and reports he has been fatigued with this. Is trying to be active during the day but reports this is hard some days. Pt reports no leakage since last visit, urgency is "a little better" usually with first void most urgent but has been able to make it. Pt does report bowel movements are no longer small thin voids, more normal now.   PAIN:  Are you having pain? No NPRS scale: 3/10  Pain location:  aching after injection  Pain type: aching Pain description: constant   Aggravating factors: wakes from sleeping when it happens, but randomly Relieving factors: quickly relieves   PRECAUTIONS: Other: currently has CA  WEIGHT BEARING RESTRICTIONS No  FALLS:  Has patient fallen in last 6 months? No  LIVING ENVIRONMENT: Lives with: lives alone Lives in: House/apartment Stairs: Yes: Internal: split level with several steps up/down steps; one set going upstairs but none downstairs Has following equipment at home: None  OCCUPATION: [employer] employee, needs to sit 8 hours  PLOF:  Independent  PATIENT GOALS to keep strength and decrease an incontinence   PERTINENT HISTORY:  1.Adenocarcinoma of the proximal rectum/distal sigmoid .Colonoscopy 11/19/2021-partially obstructing mass at 12-19 cm, biopsy moderate to poorly differentiated adenocarcinoma, Mildly elevated CEA, CTs 11/29/2021-mural thickening in the distal sigmoid/proximal rectum with haziness of the mesorectal fat, mesorectal lymphadenopathy, small pulmonary nodules favored to be benign. MRI pelvis 12/03/2021-tumor at 10 cm from the anal verge, T3c, approximately 10 metastatic nodes in the mesorectal sheath and presacral space, no extra mesorectal lymphadenopathy, N2 Cycle 1 FOLFOX 12/19/2021, Cycle 2 FOLFOX 01/02/2022, home Decadron prophylaxis added for delayed nausea, Cycle 3 FOLFOX 01/15/2022, Cycle 4 FOLFOX 01/29/2022, 5-FU bolus, leucovorin, and oxaliplatin dose reduced secondary to neutropenia and thrombocytopenia 2.Major depressive disorder, 3.Change in bowel habits and rectal bleeding, blurred vision    BOWEL MOVEMENT Pain with bowel movement: Yes Type of bowel movement:Type (Bristol Stool Scale) 1-4, Frequency 1x per day, and Strain Yes (but rare) Fully empty rectum: No Leakage: No Pads: No Fiber supplement: Yes: miralax   URINATION Pain with urination: No Fully empty bladder: Yes:   Stream: Strong and Weak Urgency: Yes: infrequently but does have mild urgency  Frequency: not quicker than every 2 hours  Leakage:  none Pads: No  INTERCOURSE Pain with intercourse:  not active      OBJECTIVE:   DIAGNOSTIC FINDINGS:   COGNITION:  Overall cognitive status: Within functional limits for tasks assessed     SENSATION:  Light touch: Appears intact  Proprioception: Appears intact  MUSCLE LENGTH: Bil Hamstrings and adductors limited by 50%   FUNCTIONAL TESTS:  Single leg stance unable standing on Lt, 5s on Rt without UE support  GAIT: Distance walked: 150' Assistive device utilized: None Level  of assistance: Complete Independence Comments: decreased cadence, slightly decreased stride length, decreased bil step height    POSTURE: rounded shoulders, forward head, and posterior pelvic tilt  LUMBARAROM/PROM  A/PROM A/PROM  eval  Flexion Limited by 75%  Extension Limited by 25%  Right lateral flexion Limited by 75%  Left lateral flexion Limited by 75%  Right rotation Limited by 50%  Left rotation Limited by 50%   (Blank rows = not tested)  LOWER EXTREMITY AROM/PROM:  WFL bil  LOWER EXTREMITY MMT:  Hip abduction 3+/5, extension and adduction 4/5, flexion 3+/5 with pain for testing bil  PALPATION: GENERAL no TTP              External Perineal Exam  deferred               Internal Pelvic Floor deferred  Patient confirms identification and approves PT to assess internal pelvic floor and treatment No  PELVIC MMT:   MMT eval  Internal Anal Sphincter   External Anal Sphincter   Puborectalis   Diastasis Recti   (Blank rows = not tested)   TONE: Deferred   TODAY'S TREATMENT  02/25/22: Pt had questions about HEP, reprinted and all reviewed  5x STS: 15.2s with UE support at chair X2 reps of all HEP exercises for carry over for home, extra time to complete minimal cues for technique.     PATIENT EDUCATION:  Education details: [email or web address] Person educated: Patient Education method: Explanation, Demonstration, Tactile cues, Verbal cues, and Handouts Education comprehension: verbalized understanding and returned demonstration   HOME EXERCISE PROGRAM: Access Code: [email or web address] URL: [email or web address] Date: 02/25/2022 Prepared by: [facility] - Outpatient Rehab - [facility]  Exercises - Supine Diaphragmatic Breathing with Pelvic Floor Lengthening  - 1 x daily - 7 x weekly - 1 sets - 10 reps - Cat Cow  - 1 x daily - 7 x weekly - 1 sets - 10 reps - Seated Hamstring Stretch  - 1 x daily - 7 x weekly - 1 sets - 3 reps - 30s holds - Supine  Butterfly Groin Stretch  - 1 x daily - 7 x weekly - 1 sets - 3 reps - 30s holds - Sidelying Thoracic Rotation with Open Book  - 1 x daily - 7 x weekly - 1 sets - 3 reps - 30s holds - Sit to Stand with Counter Support  - 1 x daily - 7 x weekly - 2 sets - 10 reps - Standing 3-Way Leg Reach with Resistance at Ankles and Counter Support  - 1 x daily - 7 x weekly - 1 sets - 10 reps - Standing Single Arm Elbow Flexion with Resistance  - 1 x daily - 7 x weekly - 2 sets - 10 reps - Standing Shoulder Row with Anchored Resistance  - 1 x daily - 7 x weekly - 2 sets - 10 reps - Walking  - 1 x daily - 7 x weekly - minutes per day 20  ASSESSMENT:  CLINICAL IMPRESSION: Patient reports he has felt better with less urgency and no leakage. Stools are fairly normal now no longer having "ribbons". Pt requesting to spend time on HEP again as he had forgotten about them and wanted to review these. Reprinted and all went over. Pt also completed 5xSTS at 15s with intermittent use of UE support at arm rest. Pt reports he feels his strength has been good and balance overall has been good, does know his energy levels has been varying with chemo days. Pt would benefit from additional PT to further address deficits.     OBJECTIVE IMPAIRMENTS decreased activity tolerance, decreased balance, decreased coordination, decreased endurance, decreased mobility, decreased strength, increased fascial restrictions, impaired flexibility, and postural dysfunction.   ACTIVITY LIMITATIONS carrying, lifting, bending, squatting, continence, and locomotion level  PARTICIPATION LIMITATIONS: cleaning, interpersonal relationship, community activity, occupation, and yard work  PERSONAL FACTORS Past/current experiences, Time since onset of injury/illness/exacerbation, and 1 comorbidity: medical history  are also affecting patient's functional outcome.   REHAB POTENTIAL: Good  CLINICAL DECISION MAKING: Evolving/moderate complexity  EVALUATION  COMPLEXITY: Moderate   GOALS: Goals reviewed with patient? Yes  SHORT TERM GOALS: Target date: 03/11/2022   Pt to be I with  HEP.  Baseline: Goal status: INITIAL  2.  Pt will report her BMs are complete due to improved bowel habits and evacuation techniques.  Baseline:  Goal status: INITIAL  3.  Pt to be I with abdominal massage, voiding and breathing mechanics for improved voiding habits. Baseline:  Goal status: INITIAL  LONG TERM GOALS: Target date:  05/14/22    Pt to be I with advanced HEP.  Baseline:  Goal status: INITIAL  2.  Demonstrate 5xSTS in 11s for decreased fall risk.  Baseline:  Goal status: INITIAL  3.  Pt to demonstrate improved coordination of pelvic floor and breathing mechanics with squatting at least 20# without compensatory strategies for improved pelvic stability without leakage or pain.  Baseline:  Goal status: INITIAL  4.  Pt to demonstrate at least 5/5 bil hip strength for improved pelvic stability and functional squats without leakage.  Baseline:  Goal status: INITIAL  5.  Pt to report no more than 3 bowel movements per day without need of straining for improved bowel habits post radiation.  Baseline:  Goal status: INITIAL   PLAN: PT FREQUENCY: 1x/week  PT DURATION:  2 sessions then to return post radiation treatment for continued PFPT and reassessment   PLANNED INTERVENTIONS: Therapeutic exercises, Therapeutic activity, Neuromuscular re-education, Patient/Family education, Self Care, Joint mobilization, Aquatic Therapy, Dry Needling, Spinal mobilization, Cryotherapy, Moist heat, scar mobilization, Taping, Biofeedback, and Manual therapy  PLAN FOR NEXT SESSION: review HEP, go over voiding mechanics and breathing mechanics.   Otelia Sergeant, PT, DPT 09/11/234:55 PM  PHYSICAL THERAPY DISCHARGE SUMMARY  Visits from Start of Care: 2  Current functional level related to goals / functional outcomes: Unable to formally reassess as pt has not  returned since last visit   Remaining deficits: Unable to assess   Education / Equipment: HEP   Patient agrees to discharge. Patient goals were not met. Patient is being discharged due to not returning since the last visit.  Otelia Sergeant, PT, DPT 02/03/253:16 PM

## 2022-02-26 ENCOUNTER — Inpatient Hospital Stay: Payer: BC Managed Care – PPO

## 2022-02-26 ENCOUNTER — Other Ambulatory Visit: Payer: Self-pay

## 2022-02-26 ENCOUNTER — Inpatient Hospital Stay: Payer: BC Managed Care – PPO | Attending: Nurse Practitioner | Admitting: Nurse Practitioner

## 2022-02-26 ENCOUNTER — Encounter: Payer: Self-pay | Admitting: Nurse Practitioner

## 2022-02-26 VITALS — BP 137/87 | HR 92 | Temp 98.1°F | Resp 20 | Ht 73.0 in | Wt 264.4 lb

## 2022-02-26 VITALS — BP 135/87 | HR 76

## 2022-02-26 DIAGNOSIS — R21 Rash and other nonspecific skin eruption: Secondary | ICD-10-CM | POA: Diagnosis not present

## 2022-02-26 DIAGNOSIS — C20 Malignant neoplasm of rectum: Secondary | ICD-10-CM

## 2022-02-26 DIAGNOSIS — R970 Elevated carcinoembryonic antigen [CEA]: Secondary | ICD-10-CM | POA: Insufficient documentation

## 2022-02-26 DIAGNOSIS — G620 Drug-induced polyneuropathy: Secondary | ICD-10-CM | POA: Insufficient documentation

## 2022-02-26 DIAGNOSIS — T451X5A Adverse effect of antineoplastic and immunosuppressive drugs, initial encounter: Secondary | ICD-10-CM | POA: Diagnosis not present

## 2022-02-26 DIAGNOSIS — Z5111 Encounter for antineoplastic chemotherapy: Secondary | ICD-10-CM | POA: Insufficient documentation

## 2022-02-26 DIAGNOSIS — Z720 Tobacco use: Secondary | ICD-10-CM | POA: Insufficient documentation

## 2022-02-26 DIAGNOSIS — D6959 Other secondary thrombocytopenia: Secondary | ICD-10-CM | POA: Diagnosis not present

## 2022-02-26 LAB — CMP (CANCER CENTER ONLY)
ALT: 37 U/L (ref 0–44)
AST: 38 U/L (ref 15–41)
Albumin: 4.1 g/dL (ref 3.5–5.0)
Alkaline Phosphatase: 77 U/L (ref 38–126)
Anion gap: 10 (ref 5–15)
BUN: 14 mg/dL (ref 6–20)
CO2: 25 mmol/L (ref 22–32)
Calcium: 9.3 mg/dL (ref 8.9–10.3)
Chloride: 104 mmol/L (ref 98–111)
Creatinine: 0.7 mg/dL (ref 0.61–1.24)
GFR, Estimated: >60 mL/min (ref >60)
Glucose, Bld: 121 mg/dL — ABNORMAL HIGH (ref 70–99)
Potassium: 4 mmol/L (ref 3.5–5.1)
Sodium: 139 mmol/L (ref 135–145)
Total Bilirubin: 0.5 mg/dL (ref 0.3–1.2)
Total Protein: 7 g/dL (ref 6.5–8.1)

## 2022-02-26 LAB — CBC WITH DIFFERENTIAL (CANCER CENTER ONLY)
Abs Immature Granulocytes: 0.15 10*3/uL — ABNORMAL HIGH (ref 0.00–0.07)
Basophils Absolute: 0.1 10*3/uL (ref 0.0–0.1)
Basophils Relative: 1 %
Eosinophils Absolute: 0.2 10*3/uL (ref 0.0–0.5)
Eosinophils Relative: 3 %
HCT: 44.4 % (ref 39.0–52.0)
Hemoglobin: 14.9 g/dL (ref 13.0–17.0)
Immature Granulocytes: 3 %
Lymphocytes Relative: 33 %
Lymphs Abs: 1.8 10*3/uL (ref 0.7–4.0)
MCH: 30.8 pg (ref 26.0–34.0)
MCHC: 33.6 g/dL (ref 30.0–36.0)
MCV: 91.7 fL (ref 80.0–100.0)
Monocytes Absolute: 0.7 10*3/uL (ref 0.1–1.0)
Monocytes Relative: 13 %
Neutro Abs: 2.5 10*3/uL (ref 1.7–7.7)
Neutrophils Relative %: 47 %
Platelet Count: 102 10*3/uL — ABNORMAL LOW (ref 150–400)
RBC: 4.84 MIL/uL (ref 4.22–5.81)
RDW: 18.3 % — ABNORMAL HIGH (ref 11.5–15.5)
WBC Count: 5.4 10*3/uL (ref 4.0–10.5)
nRBC: 0 % (ref 0.0–0.2)

## 2022-02-26 MED ORDER — PALONOSETRON HCL INJECTION 0.25 MG/5ML
0.2500 mg | Freq: Once | INTRAVENOUS | Status: AC
  Administered 2022-02-26: 0.25 mg via INTRAVENOUS
  Filled 2022-02-26: qty 5

## 2022-02-26 MED ORDER — FLUOROURACIL CHEMO INJECTION 2.5 GM/50ML
300.0000 mg/m2 | Freq: Once | INTRAVENOUS | Status: AC
  Administered 2022-02-26: 750 mg via INTRAVENOUS
  Filled 2022-02-26: qty 15

## 2022-02-26 MED ORDER — OXALIPLATIN CHEMO INJECTION 100 MG/20ML
150.0000 mg | Freq: Once | INTRAVENOUS | Status: AC
  Administered 2022-02-26: 150 mg via INTRAVENOUS
  Filled 2022-02-26: qty 20

## 2022-02-26 MED ORDER — SODIUM CHLORIDE 0.9 % IV SOLN
10.0000 mg | Freq: Once | INTRAVENOUS | Status: AC
  Administered 2022-02-26: 10 mg via INTRAVENOUS
  Filled 2022-02-26: qty 1

## 2022-02-26 MED ORDER — LEUCOVORIN CALCIUM INJECTION 350 MG
300.0000 mg/m2 | Freq: Once | INTRAVENOUS | Status: AC
  Administered 2022-02-26: 732 mg via INTRAVENOUS
  Filled 2022-02-26: qty 17.5

## 2022-02-26 MED ORDER — SODIUM CHLORIDE 0.9 % IV SOLN
5000.0000 mg | INTRAVENOUS | Status: DC
  Administered 2022-02-26: 5000 mg via INTRAVENOUS
  Filled 2022-02-26: qty 100

## 2022-02-26 MED ORDER — DEXTROSE 5 % IV SOLN: Freq: Once | INTRAVENOUS | Status: AC

## 2022-02-26 NOTE — Progress Notes (Signed)
Patient seen by Lisa Thomas NP today  Vitals are within treatment parameters.  Labs reviewed by Lisa Thomas NP and are within treatment parameters.  Per physician team, patient is ready for treatment and there are NO modifications to the treatment plan.     

## 2022-02-26 NOTE — Progress Notes (Signed)
  [facility] OFFICE PROGRESS NOTE   Diagnosis: Rectal cancer  INTERVAL HISTORY:   Mr. Schleyer returns as scheduled.  He completed cycle 5 FOLFOX 02/12/2022.  Nausea was similar as with previous cycles, beginning day 4 and lasting a few days.  No vomiting.  No mouth sores.  No diarrhea.  He did develop constipation.  He takes MiraLAX as needed.  Stable neuropathy symptoms.  Objective:  Vital signs in last 24 hours:  Blood pressure 137/87, pulse 92, temperature 98.1 F (36.7 C), temperature source Oral, resp. rate 20, height '6\' 1"'$  (1.854 m), weight 264 lb 6.4 oz (119.9 kg), SpO2 98 %.    HEENT: No thrush or ulcers. Resp: Lungs clear bilaterally. Cardio: Regular rate and rhythm. GI: Abdomen soft and nontender.  No hepatomegaly. Vascular: No leg edema. Neuro: Vibratory sense intact over the fingertips per tuning fork exam. Skin: Erythematous rash over the forearms.  Palms without erythema. Port-A-Cath without erythema.   Lab Results:  Lab Results  Component Value Date   WBC 5.4 02/26/2022   HGB 14.9 02/26/2022   HCT 44.4 02/26/2022   MCV 91.7 02/26/2022   PLT 102 (L) 02/26/2022   NEUTROABS 2.5 02/26/2022    Imaging:  No results found.  Medications: I have reviewed the patient's current medications.  Assessment/Plan: Adenocarcinoma of the proximal rectum/distal sigmoid Colonoscopy 11/19/2021-partially obstructing mass at 12-19 cm, biopsy moderate to poorly differentiated adenocarcinoma Mildly elevated CEA CTs 11/29/2021-mural thickening in the distal sigmoid/proximal rectum with haziness of the mesorectal fat, mesorectal lymphadenopathy, small pulmonary nodules favored to be benign MRI pelvis 12/03/2021-tumor at 10 cm from the anal verge, T3c, approximately 10 metastatic nodes in the mesorectal sheath and presacral space, no extra mesorectal lymphadenopathy, N2 Cycle 1 FOLFOX 12/19/2021 Cycle 2 FOLFOX 01/02/2022, home Decadron prophylaxis added for delayed  nausea Cycle 3 FOLFOX 01/15/2022 Cycle 4 FOLFOX 01/29/2022, 5-FU bolus, leucovorin, and oxaliplatin dose reduced secondary to neutropenia and thrombocytopenia Cycle 5 FOLFOX 02/12/2022, Udenyca Cycle 6 FOLFOX 02/26/2022, Udenyca Major depressive disorder Change in bowel habits and rectal bleeding secondary to #1 Ongoing tobacco use Report of blurred vision and diplopia Peripheral neuropathy-fingers and left foot  Disposition: Mr. Tangen appears stable.  He has completed 5 cycles of FOLFOX.  Plan to proceed with cycle 6 today as scheduled.  He will again receive Udenyca on day of pump discontinuation.  CBC reviewed.  Counts adequate to proceed as above.  He has stable mild to moderate thrombocytopenia.  He understands the rash on the forearms is likely related to 5-fluorouracil.  We discussed avoiding sun exposure and utilizing sunscreen if exposure cannot be avoided.  He will return for lab, follow-up, cycle 7 FOLFOX in 2 weeks.    Ned Card ANP/GNP-BC   02/26/2022  10:45 AM

## 2022-02-26 NOTE — Patient Instructions (Signed)
[facility]   Discharge Instructions: Thank you for choosing [facility] to provide your oncology and hematology care.   If you have a lab appointment with the [facility], please go directly to the [facility] and check in at the registration area.   Wear comfortable clothing and clothing appropriate for easy access to any Portacath or PICC line.   We strive to give you quality time with your provider. You may need to reschedule your appointment if you arrive late (15 or more minutes).  Arriving late affects you and other patients whose appointments are after yours.  Also, if you miss three or more appointments without notifying the office, you may be dismissed from the clinic at the provider's discretion.      For prescription refill requests, have your pharmacy contact our office and allow 72 hours for refills to be completed.    Today you received the following chemotherapy and/or immunotherapy agents Oxaliplatin (ELOXATIN), Leucovorin & Flourouracil (ADRUCIL).      To help prevent nausea and vomiting after your treatment, we encourage you to take your nausea medication as directed.  BELOW ARE SYMPTOMS THAT SHOULD BE REPORTED IMMEDIATELY: *FEVER GREATER THAN 100.4 F (38 C) OR HIGHER *CHILLS OR SWEATING *NAUSEA AND VOMITING THAT IS NOT CONTROLLED WITH YOUR NAUSEA MEDICATION *UNUSUAL SHORTNESS OF BREATH *UNUSUAL BRUISING OR BLEEDING *URINARY PROBLEMS (pain or burning when urinating, or frequent urination) *BOWEL PROBLEMS (unusual diarrhea, constipation, pain near the anus) TENDERNESS IN MOUTH AND THROAT WITH OR WITHOUT PRESENCE OF ULCERS (sore throat, sores in mouth, or a toothache) UNUSUAL RASH, SWELLING OR PAIN  UNUSUAL VAGINAL DISCHARGE OR ITCHING   Items with * indicate a potential emergency and should be followed up as soon as possible or go to the Emergency Department if any problems should occur.  Please show the CHEMOTHERAPY ALERT  CARD or IMMUNOTHERAPY ALERT CARD at check-in to the Emergency Department and triage nurse.  Should you have questions after your visit or need to cancel or reschedule your appointment, please contact [facility]  Dept: [phone]  and follow the prompts.  Office hours are 8:00 a.m. to 4:30 p.m. Monday - Friday. Please note that voicemails left after 4:00 p.m. may not be returned until the following business day.  We are closed weekends and major holidays. You have access to a nurse at all times for urgent questions. Please call the main number to the clinic Dept: [phone] and follow the prompts.   For any non-urgent questions, you may also contact your provider using MyChart. We now offer e-Visits for anyone 59 and older to request care online for non-urgent symptoms. For details visit [email or web address].   Also download the MyChart app! Go to the app store, search "MyChart", open the app, select , and log in with your MyChart username and password.  Masks are optional in the cancer centers. If you would like for your care team to wear a mask while they are taking care of you, please let them know. You may have one support person who is at least 59 years old accompany you for your appointments.  Oxaliplatin Injection What is this medication? OXALIPLATIN (ox AL i PLA tin) treats some types of cancer. It works by slowing down the growth of cancer cells. This medicine may be used for other purposes; ask your health care provider or pharmacist if you have questions. COMMON BRAND NAME(S): Eloxatin What should I tell my  care team before I take this medication? They need to know if you have any of these conditions: Heart disease History of irregular heartbeat or rhythm Liver disease Low blood cell levels (white cells, red cells, and platelets) Lung or breathing disease, such as asthma Take medications that treat or prevent blood clots Tingling of the  fingers, toes, or other nerve disorder An unusual or allergic reaction to oxaliplatin, other medications, foods, dyes, or preservatives If you or your partner are pregnant or trying to get pregnant Breast-feeding How should I use this medication? This medication is injected into a vein. It is given by your care team in a hospital or clinic setting. Talk to your care team about the use of this medication in children. Special care may be needed. Overdosage: If you think you have taken too much of this medicine contact a poison control center or emergency room at once. NOTE: This medicine is only for you. Do not share this medicine with others. What if I miss a dose? Keep appointments for follow-up doses. It is important not to miss a dose. Call your care team if you are unable to keep an appointment. What may interact with this medication? Do not take this medication with any of the following: Cisapride Dronedarone Pimozide Thioridazine This medication may also interact with the following: Aspirin and aspirin-like medications Certain medications that treat or prevent blood clots, such as warfarin, apixaban, dabigatran, and rivaroxaban Cisplatin Cyclosporine Diuretics Medications for infection, such as acyclovir, adefovir, amphotericin B, bacitracin, cidofovir, foscarnet, ganciclovir, gentamicin, pentamidine, vancomycin NSAIDs, medications for pain and inflammation, such as ibuprofen or naproxen Other medications that cause heart rhythm changes Pamidronate Zoledronic acid This list may not describe all possible interactions. Give your health care provider a list of all the medicines, herbs, non-prescription drugs, or dietary supplements you use. Also tell them if you smoke, drink alcohol, or use illegal drugs. Some items may interact with your medicine. What should I watch for while using this medication? Your condition will be monitored carefully while you are receiving this  medication. You may need blood work while taking this medication. This medication may make you feel generally unwell. This is not uncommon as chemotherapy can affect healthy cells as well as cancer cells. Report any side effects. Continue your course of treatment even though you feel ill unless your care team tells you to stop. This medication may increase your risk of getting an infection. Call your care team for advice if you get a fever, chills, sore throat, or other symptoms of a cold or flu. Do not treat yourself. Try to avoid being around people who are sick. Avoid taking medications that contain aspirin, acetaminophen, ibuprofen, naproxen, or ketoprofen unless instructed by your care team. These medications may hide a fever. Be careful brushing or flossing your teeth or using a toothpick because you may get an infection or bleed more easily. If you have any dental work done, tell your dentist you are receiving this medication. This medication can make you more sensitive to cold. Do not drink cold drinks or use ice. Cover exposed skin before coming in contact with cold temperatures or cold objects. When out in cold weather wear warm clothing and cover your mouth and nose to warm the air that goes into your lungs. Tell your care team if you get sensitive to the cold. Talk to your care team if you or your partner are pregnant or think either of you might be pregnant. This medication can  cause serious birth defects if taken during pregnancy and for 9 months after the last dose. A negative pregnancy test is required before starting this medication. A reliable form of contraception is recommended while taking this medication and for 9 months after the last dose. Talk to your care team about effective forms of contraception. Do not father a child while taking this medication and for 6 months after the last dose. Use a condom while having sex during this time period. Do not breastfeed while taking this  medication and for 3 months after the last dose. This medication may cause infertility. Talk to your care team if you are concerned about your fertility. What side effects may I notice from receiving this medication? Side effects that you should report to your care team as soon as possible: Allergic reactions--skin rash, itching, hives, swelling of the face, lips, tongue, or throat Bleeding--bloody or black, tar-like stools, vomiting blood or brown material that looks like coffee grounds, red or dark brown urine, small red or purple spots on skin, unusual bruising or bleeding Dry cough, shortness of breath or trouble breathing Heart rhythm changes--fast or irregular heartbeat, dizziness, feeling faint or lightheaded, chest pain, trouble breathing Infection--fever, chills, cough, sore throat, wounds that don't heal, pain or trouble when passing urine, general feeling of discomfort or being unwell Liver injury--right upper belly pain, loss of appetite, nausea, light-colored stool, dark yellow or brown urine, yellowing skin or eyes, unusual weakness or fatigue Low red blood cell level--unusual weakness or fatigue, dizziness, headache, trouble breathing Muscle injury--unusual weakness or fatigue, muscle pain, dark yellow or brown urine, decrease in amount of urine Pain, tingling, or numbness in the hands or feet Sudden and severe headache, confusion, change in vision, seizures, which may be signs of posterior reversible encephalopathy syndrome (PRES) Unusual bruising or bleeding Side effects that usually do not require medical attention (report to your care team if they continue or are bothersome): Diarrhea Nausea Pain, redness, or swelling with sores inside the mouth or throat Unusual weakness or fatigue Vomiting This list may not describe all possible side effects. Call your doctor for medical advice about side effects. You may report side effects to FDA at 1-800-FDA-1088. Where should I keep my  medication? This medication is given in a hospital or clinic. It will not be stored at home. NOTE: This sheet is a summary. It may not cover all possible information. If you have questions about this medicine, talk to your doctor, pharmacist, or health care provider.  2023 Elsevier/Gold Standard (2021-09-28 00:00:00)  Leucovorin Injection What is this medication? LEUCOVORIN (loo koe VOR in) prevents side effects from certain medications, such as methotrexate. It works by increasing folate levels. This helps protect healthy cells in your body. It may also be used to treat anemia caused by low levels of folate. It can also be used with fluorouracil, a type of chemotherapy, to treat colorectal cancer. It works by increasing the effects of fluorouracil in the body. This medicine may be used for other purposes; ask your health care provider or pharmacist if you have questions. What should I tell my care team before I take this medication? They need to know if you have any of these conditions: Anemia from low levels of vitamin B12 in the blood An unusual or allergic reaction to leucovorin, folic acid, other medications, foods, dyes, or preservatives Pregnant or trying to get pregnant Breastfeeding How should I use this medication? This medication is injected into a vein or a  muscle. It is given by your care team in a hospital or clinic setting. Talk to your care team about the use of this medication in children. Special care may be needed. Overdosage: If you think you have taken too much of this medicine contact a poison control center or emergency room at once. NOTE: This medicine is only for you. Do not share this medicine with others. What if I miss a dose? Keep appointments for follow-up doses. It is important not to miss your dose. Call your care team if you are unable to keep an appointment. What may interact with this  medication? Capecitabine Fluorouracil Phenobarbital Phenytoin Primidone Trimethoprim;sulfamethoxazole This list may not describe all possible interactions. Give your health care provider a list of all the medicines, herbs, non-prescription drugs, or dietary supplements you use. Also tell them if you smoke, drink alcohol, or use illegal drugs. Some items may interact with your medicine. What should I watch for while using this medication? Your condition will be monitored carefully while you are receiving this medication. This medication may increase the side effects of 5-fluorouracil. Tell your care team if you have diarrhea or mouth sores that do not get better or that get worse. What side effects may I notice from receiving this medication? Side effects that you should report to your care team as soon as possible: Allergic reactions--skin rash, itching, hives, swelling of the face, lips, tongue, or throat This list may not describe all possible side effects. Call your doctor for medical advice about side effects. You may report side effects to FDA at 1-800-FDA-1088. Where should I keep my medication? This medication is given in a hospital or clinic. It will not be stored at home. NOTE: This sheet is a summary. It may not cover all possible information. If you have questions about this medicine, talk to your doctor, pharmacist, or health care provider.  2023 Elsevier/Gold Standard (2021-10-12 00:00:00)  Fluorouracil Injection What is this medication? FLUOROURACIL (flure oh YOOR a sil) treats some types of cancer. It works by slowing down the growth of cancer cells. This medicine may be used for other purposes; ask your health care provider or pharmacist if you have questions. COMMON BRAND NAME(S): Adrucil What should I tell my care team before I take this medication? They need to know if you have any of these conditions: Blood disorders Dihydropyrimidine dehydrogenase (DPD)  deficiency Infection, such as chickenpox, cold sores, herpes Kidney disease Liver disease Poor nutrition Recent or ongoing radiation therapy An unusual or allergic reaction to fluorouracil, other medications, foods, dyes, or preservatives If you or your partner are pregnant or trying to get pregnant Breast-feeding How should I use this medication? This medication is injected into a vein. It is administered by your care team in a hospital or clinic setting. Talk to your care team about the use of this medication in children. Special care may be needed. Overdosage: If you think you have taken too much of this medicine contact a poison control center or emergency room at once. NOTE: This medicine is only for you. Do not share this medicine with others. What if I miss a dose? Keep appointments for follow-up doses. It is important not to miss your dose. Call your care team if you are unable to keep an appointment. What may interact with this medication? Do not take this medication with any of the following: Live virus vaccines This medication may also interact with the following: Medications that treat or prevent blood clots, such  as warfarin, enoxaparin, dalteparin This list may not describe all possible interactions. Give your health care provider a list of all the medicines, herbs, non-prescription drugs, or dietary supplements you use. Also tell them if you smoke, drink alcohol, or use illegal drugs. Some items may interact with your medicine. What should I watch for while using this medication? Your condition will be monitored carefully while you are receiving this medication. This medication may make you feel generally unwell. This is not uncommon as chemotherapy can affect healthy cells as well as cancer cells. Report any side effects. Continue your course of treatment even though you feel ill unless your care team tells you to stop. In some cases, you may be given additional medications  to help with side effects. Follow all directions for their use. This medication may increase your risk of getting an infection. Call your care team for advice if you get a fever, chills, sore throat, or other symptoms of a cold or flu. Do not treat yourself. Try to avoid being around people who are sick. This medication may increase your risk to bruise or bleed. Call your care team if you notice any unusual bleeding. Be careful brushing or flossing your teeth or using a toothpick because you may get an infection or bleed more easily. If you have any dental work done, tell your dentist you are receiving this medication. Avoid taking medications that contain aspirin, acetaminophen, ibuprofen, naproxen, or ketoprofen unless instructed by your care team. These medications may hide a fever. Do not treat diarrhea with over the counter products. Contact your care team if you have diarrhea that lasts more than 2 days or if it is severe and watery. This medication can make you more sensitive to the sun. Keep out of the sun. If you cannot avoid being in the sun, wear protective clothing and sunscreen. Do not use sun lamps, tanning beds, or tanning booths. Talk to your care team if you or your partner wish to become pregnant or think you might be pregnant. This medication can cause serious birth defects if taken during pregnancy and for 3 months after the last dose. A reliable form of contraception is recommended while taking this medication and for 3 months after the last dose. Talk to your care team about effective forms of contraception. Do not father a child while taking this medication and for 3 months after the last dose. Use a condom while having sex during this time period. Do not breastfeed while taking this medication. This medication may cause infertility. Talk to your care team if you are concerned about your fertility. What side effects may I notice from receiving this medication? Side effects that you  should report to your care team as soon as possible: Allergic reactions--skin rash, itching, hives, swelling of the face, lips, tongue, or throat Heart attack--pain or tightness in the chest, shoulders, arms, or jaw, nausea, shortness of breath, cold or clammy skin, feeling faint or lightheaded Heart failure--shortness of breath, swelling of the ankles, feet, or hands, sudden weight gain, unusual weakness or fatigue Heart rhythm changes--fast or irregular heartbeat, dizziness, feeling faint or lightheaded, chest pain, trouble breathing High ammonia level--unusual weakness or fatigue, confusion, loss of appetite, nausea, vomiting, seizures Infection--fever, chills, cough, sore throat, wounds that don't heal, pain or trouble when passing urine, general feeling of discomfort or being unwell Low red blood cell level--unusual weakness or fatigue, dizziness, headache, trouble breathing Pain, tingling, or numbness in the hands or feet, muscle weakness,  change in vision, confusion or trouble speaking, loss of balance or coordination, trouble walking, seizures Redness, swelling, and blistering of the skin over hands and feet Severe or prolonged diarrhea Unusual bruising or bleeding Side effects that usually do not require medical attention (report to your care team if they continue or are bothersome): Dry skin Headache Increased tears Nausea Pain, redness, or swelling with sores inside the mouth or throat Sensitivity to light Vomiting This list may not describe all possible side effects. Call your doctor for medical advice about side effects. You may report side effects to FDA at 1-800-FDA-1088. Where should I keep my medication? This medication is given in a hospital or clinic. It will not be stored at home. NOTE: This sheet is a summary. It may not cover all possible information. If you have questions about this medicine, talk to your doctor, pharmacist, or health care provider.  2023 Elsevier/Gold  Standard (2021-10-09 00:00:00)  The chemotherapy medication bag should finish at 46 hours, 96 hours, or 7 days. For example, if your pump is scheduled for 46 hours and it was put on at 4:00 p.m., it should finish at 2:00 p.m. the day it is scheduled to come off regardless of your appointment time.     Estimated time to finish at 1:30 p.m. on Thursday 02/28/2022.   If the display on your pump reads "Low Volume" and it is beeping, take the batteries out of the pump and come to the cancer center for it to be taken off.   If the pump alarms go off prior to the pump reading "Low Volume" then call [phone] and someone can assist you.  If the plunger comes out and the chemotherapy medication is leaking out, please use your home chemo spill kit to clean up the spill. Do NOT use paper towels or other household products.  If you have problems or questions regarding your pump, please call either [phone] (24 hours a day) or the cancer center Monday-Friday 8:00 a.m.- 4:30 p.m. at the clinic number and we will assist you. If you are unable to get assistance, then go to the nearest Emergency Department and ask the staff to contact the IV team for assistance.

## 2022-02-27 DIAGNOSIS — F331 Major depressive disorder, recurrent, moderate: Secondary | ICD-10-CM | POA: Diagnosis not present

## 2022-02-27 DIAGNOSIS — F1994 Other psychoactive substance use, unspecified with psychoactive substance-induced mood disorder: Secondary | ICD-10-CM | POA: Diagnosis not present

## 2022-02-28 ENCOUNTER — Inpatient Hospital Stay: Payer: BC Managed Care – PPO

## 2022-02-28 VITALS — BP 133/80 | HR 80 | Temp 98.0°F | Resp 20

## 2022-02-28 DIAGNOSIS — C20 Malignant neoplasm of rectum: Secondary | ICD-10-CM | POA: Diagnosis not present

## 2022-02-28 DIAGNOSIS — R21 Rash and other nonspecific skin eruption: Secondary | ICD-10-CM | POA: Diagnosis not present

## 2022-02-28 DIAGNOSIS — G620 Drug-induced polyneuropathy: Secondary | ICD-10-CM | POA: Diagnosis not present

## 2022-02-28 DIAGNOSIS — Z5111 Encounter for antineoplastic chemotherapy: Secondary | ICD-10-CM | POA: Diagnosis not present

## 2022-02-28 DIAGNOSIS — D6959 Other secondary thrombocytopenia: Secondary | ICD-10-CM | POA: Diagnosis not present

## 2022-02-28 DIAGNOSIS — R970 Elevated carcinoembryonic antigen [CEA]: Secondary | ICD-10-CM | POA: Diagnosis not present

## 2022-02-28 DIAGNOSIS — T451X5A Adverse effect of antineoplastic and immunosuppressive drugs, initial encounter: Secondary | ICD-10-CM | POA: Diagnosis not present

## 2022-02-28 DIAGNOSIS — Z720 Tobacco use: Secondary | ICD-10-CM | POA: Diagnosis not present

## 2022-02-28 MED ORDER — SODIUM CHLORIDE 0.9% FLUSH
10.0000 mL | INTRAVENOUS | Status: DC | PRN
  Administered 2022-02-28: 10 mL

## 2022-02-28 MED ORDER — PEGFILGRASTIM-CBQV 6 MG/0.6ML ~~LOC~~ SOSY
6.0000 mg | PREFILLED_SYRINGE | Freq: Once | SUBCUTANEOUS | Status: AC
  Administered 2022-02-28: 6 mg via SUBCUTANEOUS
  Filled 2022-02-28: qty 0.6

## 2022-02-28 MED ORDER — HEPARIN SOD (PORK) LOCK FLUSH 100 UNIT/ML IV SOLN
500.0000 [IU] | Freq: Once | INTRAVENOUS | Status: AC | PRN
  Administered 2022-02-28: 500 [IU]

## 2022-02-28 NOTE — Patient Instructions (Signed)
Implanted Port Home Guide An implanted port is a device that is placed under the skin. It is usually placed in the chest. The device may vary based on the need. Implanted ports can be used to give IV medicine, to take blood, or to give fluids. You may have an implanted port if: You need IV medicine that would be irritating to the small veins in your hands or arms. You need IV medicines, such as chemotherapy, for a long period of time. You need IV nutrition for a long period of time. You may have fewer limitations when using a port than you would if you used other types of long-term IVs. You will also likely be able to return to normal activities after your incision heals. An implanted port has two main parts: Reservoir. The reservoir is the part where a needle is inserted to give medicines or draw blood. The reservoir is round. After the port is placed, it appears as a small, raised area under your skin. Catheter. The catheter is a small, thin tube that connects the reservoir to a vein. Medicine that is inserted into the reservoir goes into the catheter and then into the vein. How is my port accessed? To access your port: A numbing cream may be placed on the skin over the port site. Your health care provider will put on a mask and sterile gloves. The skin over your port will be cleaned carefully with a germ-killing soap and allowed to dry. Your health care provider will gently pinch the port and insert a needle into it. Your health care provider will check for a blood return to make sure the port is in the vein and is still working (patent). If your port needs to remain accessed to get medicine continuously (constant infusion), your health care provider will place a clear bandage (dressing) over the needle site. The dressing and needle will need to be changed every week, or as told by your health care provider. What is flushing? Flushing helps keep the port working. Follow instructions from your  health care provider about how and when to flush the port. Ports are usually flushed with saline solution or a medicine called heparin. The need for flushing will depend on how the port is used: If the port is only used from time to time to give medicines or draw blood, the port may need to be flushed: Before and after medicines have been given. Before and after blood has been drawn. As part of routine maintenance. Flushing may be recommended every 4-6 weeks. If a constant infusion is running, the port may not need to be flushed. Throw away any syringes in a disposal container that is meant for sharp items (sharps container). You can buy a sharps container from a pharmacy, or you can make one by using an empty hard plastic bottle with a cover. How long will my port stay implanted? The port can stay in for as long as your health care provider thinks it is needed. When it is time for the port to come out, a surgery will be done to remove it. The surgery will be similar to the procedure that was done to put the port in. Follow these instructions at home: Caring for your port and port site Flush your port as told by your health care provider. If you need an infusion over several days, follow instructions from your health care provider about how to take care of your port site. Make sure you: Change your   dressing as told by your health care provider. Wash your hands with soap and water for at least 20 seconds before and after you change your dressing. If soap and water are not available, use alcohol-based hand sanitizer. Place any used dressings or infusion bags into a plastic bag. Throw that bag in the trash. Keep the dressing that covers the needle clean and dry. Do not get it wet. Do not use scissors or sharp objects near the infusion tubing. Keep any external tubes clamped, unless they are being used. Check your port site every day for signs of infection. Check for: Redness, swelling, or  pain. Fluid or blood. Warmth. Pus or a bad smell. Protect the skin around the port site. Avoid wearing bra straps that rub or irritate the site. Protect the skin around your port from seat belts. Place a soft pad over your chest if needed. Bathe or shower as told by your health care provider. The site may get wet as long as you are not actively receiving an infusion. General instructions  Return to your normal activities as told by your health care provider. Ask your health care provider what activities are safe for you. Carry a medical alert card or wear a medical alert bracelet at all times. This will let health care providers know that you have an implanted port in case of an emergency. Where to find more information American Cancer Society: www.cancer.org American Society of Clinical Oncology: www.cancer.net Contact a health care provider if: You have a fever or chills. You have redness, swelling, or pain at the port site. You have fluid or blood coming from your port site. Your incision feels warm to the touch. You have pus or a bad smell coming from the port site. Summary Implanted ports are usually placed in the chest for long-term IV access. Follow instructions from your health care provider about flushing the port and changing bandages (dressings). Take care of the area around your port by avoiding clothing that puts pressure on the area, and by watching for signs of infection. Protect the skin around your port from seat belts. Place a soft pad over your chest if needed. Contact a health care provider if you have a fever or you have redness, swelling, pain, fluid, or a bad smell at the port site. This information is not intended to replace advice given to you by your health care provider. Make sure you discuss any questions you have with your health care provider. Document Revised: 12/05/2020 Document Reviewed: 12/05/2020 Elsevier Patient Education  2023 [employer]  Pegfilgrastim Injection What is this medication? PEGFILGRASTIM (PEG fil gra stim) lowers the risk of infection in people who are receiving chemotherapy. It works by helping your body make more white blood cells, which protects your body from infection. It may also be used to help people who have been exposed to high doses of radiation. This medicine may be used for other purposes; ask your health care provider or pharmacist if you have questions. COMMON BRAND NAME(S): Fulphila, Fylnetra, Neulasta, Nyvepria, Stimufend, UDENYCA, Ziextenzo What should I tell my care team before I take this medication? They need to know if you have any of these conditions: Kidney disease Latex allergy Ongoing radiation therapy Sickle cell disease Skin reactions to acrylic adhesives (On-Body Injector only) An unusual or allergic reaction to pegfilgrastim, filgrastim, other medications, foods, dyes, or preservatives Pregnant or trying to get pregnant Breast-feeding How should I use this medication? This medication is for injection under the   skin. If you get this medication at home, you will be taught how to prepare and give the pre-filled syringe or how to use the On-body Injector. Refer to the patient Instructions for Use for detailed instructions. Use exactly as directed. Tell your care team immediately if you suspect that the On-body Injector may not have performed as intended or if you suspect the use of the On-body Injector resulted in a missed or partial dose. It is important that you put your used needles and syringes in a special sharps container. Do not put them in a trash can. If you do not have a sharps container, call your pharmacist or care team to get one. Talk to your care team about the use of this medication in children. While this medication may be prescribed for selected conditions, precautions do apply. Overdosage: If you think you have taken too much of this medicine contact a poison control  center or emergency room at once. NOTE: This medicine is only for you. Do not share this medicine with others. What if I miss a dose? It is important not to miss your dose. Call your care team if you miss your dose. If you miss a dose due to an On-body Injector failure or leakage, a new dose should be administered as soon as possible using a single prefilled syringe for manual use. What may interact with this medication? Interactions have not been studied. This list may not describe all possible interactions. Give your health care provider a list of all the medicines, herbs, non-prescription drugs, or dietary supplements you use. Also tell them if you smoke, drink alcohol, or use illegal drugs. Some items may interact with your medicine. What should I watch for while using this medication? Your condition will be monitored carefully while you are receiving this medication. You may need blood work done while you are taking this medication. Talk to your care team about your risk of cancer. You may be more at risk for certain types of cancer if you take this medication. If you are going to need a MRI, CT scan, or other procedure, tell your care team that you are using this medication (On-Body Injector only). What side effects may I notice from receiving this medication? Side effects that you should report to your care team as soon as possible: Allergic reactions--skin rash, itching, hives, swelling of the face, lips, tongue, or throat Capillary leak syndrome--stomach or muscle pain, unusual weakness or fatigue, feeling faint or lightheaded, decrease in the amount of urine, swelling of the ankles, hands, or feet, trouble breathing High white blood cell level--fever, fatigue, trouble breathing, night sweats, change in vision, weight loss Inflammation of the aorta--fever, fatigue, back, chest, or stomach pain, severe headache Kidney injury (glomerulonephritis)--decrease in the amount of urine, red or dark  brown urine, foamy or bubbly urine, swelling of the ankles, hands, or feet Shortness of breath or trouble breathing Spleen injury--pain in upper left stomach or shoulder Unusual bruising or bleeding Side effects that usually do not require medical attention (report to your care team if they continue or are bothersome): Bone pain Pain in the hands or feet This list may not describe all possible side effects. Call your doctor for medical advice about side effects. You may report side effects to FDA at 1-800-FDA-1088. Where should I keep my medication? Keep out of the reach of children. If you are using this medication at home, you will be instructed on how to store it. Throw away any unused   medication after the expiration date on the label. NOTE: This sheet is a summary. It may not cover all possible information. If you have questions about this medicine, talk to your doctor, pharmacist, or health care provider.  2023 Elsevier/Gold Standard (2013-09-03 00:00:00)

## 2022-03-01 ENCOUNTER — Encounter: Payer: BC Managed Care – PPO | Admitting: Physical Therapy

## 2022-03-09 ENCOUNTER — Other Ambulatory Visit: Payer: Self-pay | Admitting: Oncology

## 2022-03-09 DIAGNOSIS — C20 Malignant neoplasm of rectum: Secondary | ICD-10-CM

## 2022-03-12 ENCOUNTER — Inpatient Hospital Stay (HOSPITAL_BASED_OUTPATIENT_CLINIC_OR_DEPARTMENT_OTHER): Payer: BC Managed Care – PPO | Admitting: Oncology

## 2022-03-12 ENCOUNTER — Inpatient Hospital Stay: Payer: BC Managed Care – PPO

## 2022-03-12 ENCOUNTER — Inpatient Hospital Stay: Payer: BC Managed Care – PPO | Admitting: Licensed Clinical Social Worker

## 2022-03-12 VITALS — BP 141/88 | HR 78

## 2022-03-12 VITALS — BP 136/83 | HR 82 | Temp 98.2°F | Resp 18 | Ht 73.0 in | Wt 272.3 lb

## 2022-03-12 DIAGNOSIS — R21 Rash and other nonspecific skin eruption: Secondary | ICD-10-CM | POA: Diagnosis not present

## 2022-03-12 DIAGNOSIS — T451X5A Adverse effect of antineoplastic and immunosuppressive drugs, initial encounter: Secondary | ICD-10-CM | POA: Diagnosis not present

## 2022-03-12 DIAGNOSIS — C20 Malignant neoplasm of rectum: Secondary | ICD-10-CM | POA: Diagnosis not present

## 2022-03-12 DIAGNOSIS — Z720 Tobacco use: Secondary | ICD-10-CM | POA: Diagnosis not present

## 2022-03-12 DIAGNOSIS — Z5111 Encounter for antineoplastic chemotherapy: Secondary | ICD-10-CM | POA: Diagnosis not present

## 2022-03-12 DIAGNOSIS — D6959 Other secondary thrombocytopenia: Secondary | ICD-10-CM | POA: Diagnosis not present

## 2022-03-12 DIAGNOSIS — G620 Drug-induced polyneuropathy: Secondary | ICD-10-CM | POA: Diagnosis not present

## 2022-03-12 DIAGNOSIS — R970 Elevated carcinoembryonic antigen [CEA]: Secondary | ICD-10-CM | POA: Diagnosis not present

## 2022-03-12 LAB — CBC WITH DIFFERENTIAL (CANCER CENTER ONLY)
Abs Immature Granulocytes: 0.14 10*3/uL — ABNORMAL HIGH (ref 0.00–0.07)
Basophils Absolute: 0.1 10*3/uL (ref 0.0–0.1)
Basophils Relative: 1 %
Eosinophils Absolute: 0.1 10*3/uL (ref 0.0–0.5)
Eosinophils Relative: 3 %
HCT: 44 % (ref 39.0–52.0)
Hemoglobin: 14.3 g/dL (ref 13.0–17.0)
Immature Granulocytes: 3 %
Lymphocytes Relative: 35 %
Lymphs Abs: 1.9 10*3/uL (ref 0.7–4.0)
MCH: 30.4 pg (ref 26.0–34.0)
MCHC: 32.5 g/dL (ref 30.0–36.0)
MCV: 93.4 fL (ref 80.0–100.0)
Monocytes Absolute: 1 10*3/uL (ref 0.1–1.0)
Monocytes Relative: 18 %
Neutro Abs: 2.3 10*3/uL (ref 1.7–7.7)
Neutrophils Relative %: 40 %
Platelet Count: 125 10*3/uL — ABNORMAL LOW (ref 150–400)
RBC: 4.71 MIL/uL (ref 4.22–5.81)
RDW: 18.9 % — ABNORMAL HIGH (ref 11.5–15.5)
WBC Count: 5.5 10*3/uL (ref 4.0–10.5)
nRBC: 0 % (ref 0.0–0.2)

## 2022-03-12 LAB — CMP (CANCER CENTER ONLY)
ALT: 46 U/L — ABNORMAL HIGH (ref 0–44)
AST: 38 U/L (ref 15–41)
Albumin: 4 g/dL (ref 3.5–5.0)
Alkaline Phosphatase: 75 U/L (ref 38–126)
Anion gap: 9 (ref 5–15)
BUN: 14 mg/dL (ref 6–20)
CO2: 26 mmol/L (ref 22–32)
Calcium: 9.1 mg/dL (ref 8.9–10.3)
Chloride: 105 mmol/L (ref 98–111)
Creatinine: 0.74 mg/dL (ref 0.61–1.24)
GFR, Estimated: >60 mL/min (ref >60)
Glucose, Bld: 83 mg/dL (ref 70–99)
Potassium: 4.3 mmol/L (ref 3.5–5.1)
Sodium: 140 mmol/L (ref 135–145)
Total Bilirubin: 0.4 mg/dL (ref 0.3–1.2)
Total Protein: 6.9 g/dL (ref 6.5–8.1)

## 2022-03-12 MED ORDER — OXALIPLATIN CHEMO INJECTION 100 MG/20ML
150.0000 mg | Freq: Once | INTRAVENOUS | Status: AC
  Administered 2022-03-12: 150 mg via INTRAVENOUS
  Filled 2022-03-12: qty 20

## 2022-03-12 MED ORDER — LEUCOVORIN CALCIUM INJECTION 350 MG
300.0000 mg/m2 | Freq: Once | INTRAVENOUS | Status: AC
  Administered 2022-03-12: 732 mg via INTRAVENOUS
  Filled 2022-03-12: qty 36.6

## 2022-03-12 MED ORDER — SODIUM CHLORIDE 0.9 % IV SOLN
5000.0000 mg | INTRAVENOUS | Status: DC
  Administered 2022-03-12: 5000 mg via INTRAVENOUS
  Filled 2022-03-12: qty 100

## 2022-03-12 MED ORDER — FLUOROURACIL CHEMO INJECTION 2.5 GM/50ML
300.0000 mg/m2 | Freq: Once | INTRAVENOUS | Status: AC
  Administered 2022-03-12: 750 mg via INTRAVENOUS
  Filled 2022-03-12: qty 15

## 2022-03-12 MED ORDER — PALONOSETRON HCL INJECTION 0.25 MG/5ML
0.2500 mg | Freq: Once | INTRAVENOUS | Status: AC
  Administered 2022-03-12: 0.25 mg via INTRAVENOUS
  Filled 2022-03-12: qty 5

## 2022-03-12 MED ORDER — SODIUM CHLORIDE 0.9% FLUSH: 10.0000 mL | INTRAVENOUS | Status: DC | PRN

## 2022-03-12 MED ORDER — SODIUM CHLORIDE 0.9 % IV SOLN
10.0000 mg | Freq: Once | INTRAVENOUS | Status: AC
  Administered 2022-03-12: 10 mg via INTRAVENOUS
  Filled 2022-03-12: qty 1

## 2022-03-12 MED ORDER — DEXTROSE 5 % IV SOLN: Freq: Once | INTRAVENOUS | Status: AC

## 2022-03-12 NOTE — Progress Notes (Signed)
Patient seen by Dr. Sherrill today ? ?Vitals are within treatment parameters. ? ?Labs reviewed by Dr. Sherrill and are within treatment parameters. ? ?Per physician team, patient is ready for treatment and there are NO modifications to the treatment plan.  ?

## 2022-03-12 NOTE — Patient Instructions (Addendum)
Billy Roman   The chemotherapy medication bag should finish at 46 hours, 96 hours, or 7 days. For example, if your pump is scheduled for 46 hours and it was put on at 4:00 p.m., it should finish at 2:00 p.m. the day it is scheduled to come off regardless of your appointment time.     Estimated time to finish at 1:15 Thursday, March 14, 2022.   If the display on your pump reads "Low Volume" and it is beeping, take the batteries out of the pump and come to the cancer center for it to be taken off.   If the pump alarms go off prior to the pump reading "Low Volume" then call [phone] and someone can assist you.  If the plunger comes out and the chemotherapy medication is leaking out, please use your home chemo spill kit to clean up the spill. Do NOT use paper towels or other household products.  If you have problems or questions regarding your pump, please call either [phone] (24 hours a day) or the cancer center Monday-Friday 8:00 a.m.- 4:30 p.m. at the clinic number and we will assist you. If you are unable to get assistance, then go to the nearest Emergency Department and ask the staff to contact the IV team for assistance.  Discharge Instructions: Thank you for choosing [facility] to provide your oncology and hematology care.   If you have a lab appointment with the [facility], please go directly to the [facility] and check in at the registration area.   Wear comfortable clothing and clothing appropriate for easy access to any Portacath or PICC line.   We strive to give you quality time with your provider. You may need to reschedule your appointment if you arrive late (15 or more minutes).  Arriving late affects you and other patients whose appointments are after yours.  Also, if you miss three or more appointments without notifying the office, you may be dismissed from the clinic at the provider's discretion.      For  prescription refill requests, have your pharmacy contact our office and allow 72 hours for refills to be completed.    Today you received the following chemotherapy and/or immunotherapy agents Oxaliplatin, Leucovorin, Fluorouracil.      To help prevent nausea and vomiting after your treatment, we encourage you to take your nausea medication as directed.  BELOW ARE SYMPTOMS THAT SHOULD BE REPORTED IMMEDIATELY: *FEVER GREATER THAN 100.4 F (38 C) OR HIGHER *CHILLS OR SWEATING *NAUSEA AND VOMITING THAT IS NOT CONTROLLED WITH YOUR NAUSEA MEDICATION *UNUSUAL SHORTNESS OF BREATH *UNUSUAL BRUISING OR BLEEDING *URINARY PROBLEMS (pain or burning when urinating, or frequent urination) *BOWEL PROBLEMS (unusual diarrhea, constipation, pain near the anus) TENDERNESS IN MOUTH AND THROAT WITH OR WITHOUT PRESENCE OF ULCERS (sore throat, sores in mouth, or a toothache) UNUSUAL RASH, SWELLING OR PAIN  UNUSUAL VAGINAL DISCHARGE OR ITCHING   Items with * indicate a potential emergency and should be followed up as soon as possible or go to the Emergency Department if any problems should occur.  Please show the CHEMOTHERAPY ALERT CARD or IMMUNOTHERAPY ALERT CARD at check-in to the Emergency Department and triage nurse.  Should you have questions after your visit or need to cancel or reschedule your appointment, please contact [facility]  Dept: [phone]  and follow the prompts.  Office hours are 8:00 a.m. to 4:30 p.m. Monday - Friday. Please note that voicemails  left after 4:00 p.m. may not be returned until the following business day.  We are closed weekends and major holidays. You have access to a nurse at all times for urgent questions. Please call the main number to the clinic Dept: [phone] and follow the prompts.   For any non-urgent questions, you may also contact your provider using MyChart. We now offer e-Visits for anyone 15 and older to request care online for  non-urgent symptoms. For details visit [email or web address].   Also download the MyChart app! Go to the app store, search "MyChart", open the app, select [facility], and log in with your MyChart username and password.  Masks are optional in the cancer centers. If you would like for your care team to wear a mask while they are taking care of you, please let them know. You may have one support person who is at least 59 years old accompany you for your appointments.  Oxaliplatin Injection What is this medication? OXALIPLATIN (ox AL i PLA tin) treats some types of cancer. It works by slowing down the growth of cancer cells. This medicine may be used for other purposes; ask your health care provider or pharmacist if you have questions. COMMON BRAND NAME(S): Eloxatin What should I tell my care team before I take this medication? They need to know if you have any of these conditions: Heart disease History of irregular heartbeat or rhythm Liver disease Low blood cell levels (white cells, red cells, and platelets) Lung or breathing disease, such as asthma Take medications that treat or prevent blood clots Tingling of the fingers, toes, or other nerve disorder An unusual or allergic reaction to oxaliplatin, other medications, foods, dyes, or preservatives If you or your partner are pregnant or trying to get pregnant Breast-feeding How should I use this medication? This medication is injected into a vein. It is given by your care team in a hospital or clinic setting. Talk to your care team about the use of this medication in children. Special care may be needed. Overdosage: If you think you have taken too much of this medicine contact a poison control center or emergency room at once. NOTE: This medicine is only for you. Do not share this medicine with others. What if I miss a dose? Keep appointments for follow-up doses. It is important not to miss a dose. Call your care team if you are unable  to keep an appointment. What may interact with this medication? Do not take this medication with any of the following: Cisapride Dronedarone Pimozide Thioridazine This medication may also interact with the following: Aspirin and aspirin-like medications Certain medications that treat or prevent blood clots, such as warfarin, apixaban, dabigatran, and rivaroxaban Cisplatin Cyclosporine Diuretics Medications for infection, such as acyclovir, adefovir, amphotericin B, bacitracin, cidofovir, foscarnet, ganciclovir, gentamicin, pentamidine, vancomycin NSAIDs, medications for pain and inflammation, such as ibuprofen or naproxen Other medications that cause heart rhythm changes Pamidronate Zoledronic acid This list may not describe all possible interactions. Give your health care provider a list of all the medicines, herbs, non-prescription drugs, or dietary supplements you use. Also tell them if you smoke, drink alcohol, or use illegal drugs. Some items may interact with your medicine. What should I watch for while using this medication? Your condition will be monitored carefully while you are receiving this medication. You may need blood work while taking this medication. This medication may make you feel generally unwell. This is not uncommon as chemotherapy can affect healthy cells as well as  cancer cells. Report any side effects. Continue your course of treatment even though you feel ill unless your care team tells you to stop. This medication may increase your risk of getting an infection. Call your care team for advice if you get a fever, chills, sore throat, or other symptoms of a cold or flu. Do not treat yourself. Try to avoid being around people who are sick. Avoid taking medications that contain aspirin, acetaminophen, ibuprofen, naproxen, or ketoprofen unless instructed by your care team. These medications may hide a fever. Be careful brushing or flossing your teeth or using a  toothpick because you may get an infection or bleed more easily. If you have any dental work done, tell your dentist you are receiving this medication. This medication can make you more sensitive to cold. Do not drink cold drinks or use ice. Cover exposed skin before coming in contact with cold temperatures or cold objects. When out in cold weather wear warm clothing and cover your mouth and nose to warm the air that goes into your lungs. Tell your care team if you get sensitive to the cold. Talk to your care team if you or your partner are pregnant or think either of you might be pregnant. This medication can cause serious birth defects if taken during pregnancy and for 9 months after the last dose. A negative pregnancy test is required before starting this medication. A reliable form of contraception is recommended while taking this medication and for 9 months after the last dose. Talk to your care team about effective forms of contraception. Do not father a child while taking this medication and for 6 months after the last dose. Use a condom while having sex during this time period. Do not breastfeed while taking this medication and for 3 months after the last dose. This medication may cause infertility. Talk to your care team if you are concerned about your fertility. What side effects may I notice from receiving this medication? Side effects that you should report to your care team as soon as possible: Allergic reactions--skin rash, itching, hives, swelling of the face, lips, tongue, or throat Bleeding--bloody or black, tar-like stools, vomiting blood or brown material that looks like coffee grounds, red or dark brown urine, small red or purple spots on skin, unusual bruising or bleeding Dry cough, shortness of breath or trouble breathing Heart rhythm changes--fast or irregular heartbeat, dizziness, feeling faint or lightheaded, chest pain, trouble breathing Infection--fever, chills, cough, sore  throat, wounds that don't heal, pain or trouble when passing urine, general feeling of discomfort or being unwell Liver injury--right upper belly pain, loss of appetite, nausea, light-colored stool, dark yellow or brown urine, yellowing skin or eyes, unusual weakness or fatigue Low red blood cell level--unusual weakness or fatigue, dizziness, headache, trouble breathing Muscle injury--unusual weakness or fatigue, muscle pain, dark yellow or brown urine, decrease in amount of urine Pain, tingling, or numbness in the hands or feet Sudden and severe headache, confusion, change in vision, seizures, which may be signs of posterior reversible encephalopathy syndrome (PRES) Unusual bruising or bleeding Side effects that usually do not require medical attention (report to your care team if they continue or are bothersome): Diarrhea Nausea Pain, redness, or swelling with sores inside the mouth or throat Unusual weakness or fatigue Vomiting This list may not describe all possible side effects. Call your doctor for medical advice about side effects. You may report side effects to FDA at 1-800-FDA-1088. Where should I keep my medication?  This medication is given in a hospital or clinic. It will not be stored at home. NOTE: This sheet is a summary. It may not cover all possible information. If you have questions about this medicine, talk to your doctor, pharmacist, or health care provider.  2023 Elsevier/Gold Standard (2021-09-28 00:00:00)  Oxaliplatin Injection What is this medication? OXALIPLATIN (ox AL i PLA tin) treats some types of cancer. It works by slowing down the growth of cancer cells. This medicine may be used for other purposes; ask your health care provider or pharmacist if you have questions. COMMON BRAND NAME(S): Eloxatin What should I tell my care team before I take this medication? They need to know if you have any of these conditions: Heart disease History of irregular heartbeat or  rhythm Liver disease Low blood cell levels (white cells, red cells, and platelets) Lung or breathing disease, such as asthma Take medications that treat or prevent blood clots Tingling of the fingers, toes, or other nerve disorder An unusual or allergic reaction to oxaliplatin, other medications, foods, dyes, or preservatives If you or your partner are pregnant or trying to get pregnant Breast-feeding How should I use this medication? This medication is injected into a vein. It is given by your care team in a hospital or clinic setting. Talk to your care team about the use of this medication in children. Special care may be needed. Overdosage: If you think you have taken too much of this medicine contact a poison control center or emergency room at once. NOTE: This medicine is only for you. Do not share this medicine with others. What if I miss a dose? Keep appointments for follow-up doses. It is important not to miss a dose. Call your care team if you are unable to keep an appointment. What may interact with this medication? Do not take this medication with any of the following: Cisapride Dronedarone Pimozide Thioridazine This medication may also interact with the following: Aspirin and aspirin-like medications Certain medications that treat or prevent blood clots, such as warfarin, apixaban, dabigatran, and rivaroxaban Cisplatin Cyclosporine Diuretics Medications for infection, such as acyclovir, adefovir, amphotericin B, bacitracin, cidofovir, foscarnet, ganciclovir, gentamicin, pentamidine, vancomycin NSAIDs, medications for pain and inflammation, such as ibuprofen or naproxen Other medications that cause heart rhythm changes Pamidronate Zoledronic acid This list may not describe all possible interactions. Give your health care provider a list of all the medicines, herbs, non-prescription drugs, or dietary supplements you use. Also tell them if you smoke, drink alcohol, or use  illegal drugs. Some items may interact with your medicine. What should I watch for while using this medication? Your condition will be monitored carefully while you are receiving this medication. You may need blood work while taking this medication. This medication may make you feel generally unwell. This is not uncommon as chemotherapy can affect healthy cells as well as cancer cells. Report any side effects. Continue your course of treatment even though you feel ill unless your care team tells you to stop. This medication may increase your risk of getting an infection. Call your care team for advice if you get a fever, chills, sore throat, or other symptoms of a cold or flu. Do not treat yourself. Try to avoid being around people who are sick. Avoid taking medications that contain aspirin, acetaminophen, ibuprofen, naproxen, or ketoprofen unless instructed by your care team. These medications may hide a fever. Be careful brushing or flossing your teeth or using a toothpick because you may get an  infection or bleed more easily. If you have any dental work done, tell your dentist you are receiving this medication. This medication can make you more sensitive to cold. Do not drink cold drinks or use ice. Cover exposed skin before coming in contact with cold temperatures or cold objects. When out in cold weather wear warm clothing and cover your mouth and nose to warm the air that goes into your lungs. Tell your care team if you get sensitive to the cold. Talk to your care team if you or your partner are pregnant or think either of you might be pregnant. This medication can cause serious birth defects if taken during pregnancy and for 9 months after the last dose. A negative pregnancy test is required before starting this medication. A reliable form of contraception is recommended while taking this medication and for 9 months after the last dose. Talk to your care team about effective forms of contraception.  Do not father a child while taking this medication and for 6 months after the last dose. Use a condom while having sex during this time period. Do not breastfeed while taking this medication and for 3 months after the last dose. This medication may cause infertility. Talk to your care team if you are concerned about your fertility. What side effects may I notice from receiving this medication? Side effects that you should report to your care team as soon as possible: Allergic reactions--skin rash, itching, hives, swelling of the face, lips, tongue, or throat Bleeding--bloody or black, tar-like stools, vomiting blood or brown material that looks like coffee grounds, red or dark brown urine, small red or purple spots on skin, unusual bruising or bleeding Dry cough, shortness of breath or trouble breathing Heart rhythm changes--fast or irregular heartbeat, dizziness, feeling faint or lightheaded, chest pain, trouble breathing Infection--fever, chills, cough, sore throat, wounds that don't heal, pain or trouble when passing urine, general feeling of discomfort or being unwell Liver injury--right upper belly pain, loss of appetite, nausea, light-colored stool, dark yellow or brown urine, yellowing skin or eyes, unusual weakness or fatigue Low red blood cell level--unusual weakness or fatigue, dizziness, headache, trouble breathing Muscle injury--unusual weakness or fatigue, muscle pain, dark yellow or brown urine, decrease in amount of urine Pain, tingling, or numbness in the hands or feet Sudden and severe headache, confusion, change in vision, seizures, which may be signs of posterior reversible encephalopathy syndrome (PRES) Unusual bruising or bleeding Side effects that usually do not require medical attention (report to your care team if they continue or are bothersome): Diarrhea Nausea Pain, redness, or swelling with sores inside the mouth or throat Unusual weakness or fatigue Vomiting This  list may not describe all possible side effects. Call your doctor for medical advice about side effects. You may report side effects to FDA at 1-800-FDA-1088. Where should I keep my medication? This medication is given in a hospital or clinic. It will not be stored at home. NOTE: This sheet is a summary. It may not cover all possible information. If you have questions about this medicine, talk to your doctor, pharmacist, or health care provider.  2023 Elsevier/Gold Standard (2021-09-28 00:00:00)  Leucovorin Injection What is this medication? LEUCOVORIN (loo koe VOR in) prevents side effects from certain medications, such as methotrexate. It works by increasing folate levels. This helps protect healthy cells in your body. It may also be used to treat anemia caused by low levels of folate. It can also be used with fluorouracil, a  type of chemotherapy, to treat colorectal cancer. It works by increasing the effects of fluorouracil in the body. This medicine may be used for other purposes; ask your health care provider or pharmacist if you have questions. What should I tell my care team before I take this medication? They need to know if you have any of these conditions: Anemia from low levels of vitamin B12 in the blood An unusual or allergic reaction to leucovorin, folic acid, other medications, foods, dyes, or preservatives Pregnant or trying to get pregnant Breastfeeding How should I use this medication? This medication is injected into a vein or a muscle. It is given by your care team in a hospital or clinic setting. Talk to your care team about the use of this medication in children. Special care may be needed. Overdosage: If you think you have taken too much of this medicine contact a poison control center or emergency room at once. NOTE: This medicine is only for you. Do not share this medicine with others. What if I miss a dose? Keep appointments for follow-up doses. It is important not to  miss your dose. Call your care team if you are unable to keep an appointment. What may interact with this medication? Capecitabine Fluorouracil Phenobarbital Phenytoin Primidone Trimethoprim;sulfamethoxazole This list may not describe all possible interactions. Give your health care provider a list of all the medicines, herbs, non-prescription drugs, or dietary supplements you use. Also tell them if you smoke, drink alcohol, or use illegal drugs. Some items may interact with your medicine. What should I watch for while using this medication? Your condition will be monitored carefully while you are receiving this medication. This medication may increase the side effects of 5-fluorouracil. Tell your care team if you have diarrhea or mouth sores that do not get better or that get worse. What side effects may I notice from receiving this medication? Side effects that you should report to your care team as soon as possible: Allergic reactions--skin rash, itching, hives, swelling of the face, lips, tongue, or throat This list may not describe all possible side effects. Call your doctor for medical advice about side effects. You may report side effects to FDA at 1-800-FDA-1088. Where should I keep my medication? This medication is given in a hospital or clinic. It will not be stored at home. NOTE: This sheet is a summary. It may not cover all possible information. If you have questions about this medicine, talk to your doctor, pharmacist, or health care provider.  2023 Elsevier/Gold Standard (2021-10-12 00:00:00)  Fluorouracil Injection What is this medication? FLUOROURACIL (flure oh YOOR a sil) treats some types of cancer. It works by slowing down the growth of cancer cells. This medicine may be used for other purposes; ask your health care provider or pharmacist if you have questions. COMMON BRAND NAME(S): Adrucil What should I tell my care team before I take this medication? They need to know  if you have any of these conditions: Blood disorders Dihydropyrimidine dehydrogenase (DPD) deficiency Infection, such as chickenpox, cold sores, herpes Kidney disease Liver disease Poor nutrition Recent or ongoing radiation therapy An unusual or allergic reaction to fluorouracil, other medications, foods, dyes, or preservatives If you or your partner are pregnant or trying to get pregnant Breast-feeding How should I use this medication? This medication is injected into a vein. It is administered by your care team in a hospital or clinic setting. Talk to your care team about the use of this medication in children.  Special care may be needed. Overdosage: If you think you have taken too much of this medicine contact a poison control center or emergency room at once. NOTE: This medicine is only for you. Do not share this medicine with others. What if I miss a dose? Keep appointments for follow-up doses. It is important not to miss your dose. Call your care team if you are unable to keep an appointment. What may interact with this medication? Do not take this medication with any of the following: Live virus vaccines This medication may also interact with the following: Medications that treat or prevent blood clots, such as warfarin, enoxaparin, dalteparin This list may not describe all possible interactions. Give your health care provider a list of all the medicines, herbs, non-prescription drugs, or dietary supplements you use. Also tell them if you smoke, drink alcohol, or use illegal drugs. Some items may interact with your medicine. What should I watch for while using this medication? Your condition will be monitored carefully while you are receiving this medication. This medication may make you feel generally unwell. This is not uncommon as chemotherapy can affect healthy cells as well as cancer cells. Report any side effects. Continue your course of treatment even though you feel ill  unless your care team tells you to stop. In some cases, you may be given additional medications to help with side effects. Follow all directions for their use. This medication may increase your risk of getting an infection. Call your care team for advice if you get a fever, chills, sore throat, or other symptoms of a cold or flu. Do not treat yourself. Try to avoid being around people who are sick. This medication may increase your risk to bruise or bleed. Call your care team if you notice any unusual bleeding. Be careful brushing or flossing your teeth or using a toothpick because you may get an infection or bleed more easily. If you have any dental work done, tell your dentist you are receiving this medication. Avoid taking medications that contain aspirin, acetaminophen, ibuprofen, naproxen, or ketoprofen unless instructed by your care team. These medications may hide a fever. Do not treat diarrhea with over the counter products. Contact your care team if you have diarrhea that lasts more than 2 days or if it is severe and watery. This medication can make you more sensitive to the sun. Keep out of the sun. If you cannot avoid being in the sun, wear protective clothing and sunscreen. Do not use sun lamps, tanning beds, or tanning booths. Talk to your care team if you or your partner wish to become pregnant or think you might be pregnant. This medication can cause serious birth defects if taken during pregnancy and for 3 months after the last dose. A reliable form of contraception is recommended while taking this medication and for 3 months after the last dose. Talk to your care team about effective forms of contraception. Do not father a child while taking this medication and for 3 months after the last dose. Use a condom while having sex during this time period. Do not breastfeed while taking this medication. This medication may cause infertility. Talk to your care team if you are concerned about your  fertility. What side effects may I notice from receiving this medication? Side effects that you should report to your care team as soon as possible: Allergic reactions--skin rash, itching, hives, swelling of the face, lips, tongue, or throat Heart attack--pain or tightness in the chest,  shoulders, arms, or jaw, nausea, shortness of breath, cold or clammy skin, feeling faint or lightheaded Heart failure--shortness of breath, swelling of the ankles, feet, or hands, sudden weight gain, unusual weakness or fatigue Heart rhythm changes--fast or irregular heartbeat, dizziness, feeling faint or lightheaded, chest pain, trouble breathing High ammonia level--unusual weakness or fatigue, confusion, loss of appetite, nausea, vomiting, seizures Infection--fever, chills, cough, sore throat, wounds that don't heal, pain or trouble when passing urine, general feeling of discomfort or being unwell Low red blood cell level--unusual weakness or fatigue, dizziness, headache, trouble breathing Pain, tingling, or numbness in the hands or feet, muscle weakness, change in vision, confusion or trouble speaking, loss of balance or coordination, trouble walking, seizures Redness, swelling, and blistering of the skin over hands and feet Severe or prolonged diarrhea Unusual bruising or bleeding Side effects that usually do not require medical attention (report to your care team if they continue or are bothersome): Dry skin Headache Increased tears Nausea Pain, redness, or swelling with sores inside the mouth or throat Sensitivity to light Vomiting This list may not describe all possible side effects. Call your doctor for medical advice about side effects. You may report side effects to FDA at 1-800-FDA-1088. Where should I keep my medication? This medication is given in a hospital or clinic. It will not be stored at home. NOTE: This sheet is a summary. It may not cover all possible information. If you have questions  about this medicine, talk to your doctor, pharmacist, or health care provider.  2023 Elsevier/Gold Standard (2021-10-09 00:00:00)

## 2022-03-12 NOTE — Progress Notes (Signed)
[facility] CSW Progress Note  Holiday representative met with patient to assess psychosocial needs.  Patient expressed frustration with his employer because he has received [place] papers for health insurance, but hasn't received any other information about the status of his employment.  He reports being on medical leave until December 2023.  He has the contacts at his work he needs to speak with, and will do so after treatment today.  He has had a conversation with his daughters about retiring early and going on a road trip on his motorcycle.  He declined an express arts activity at this time.  CSW provided active listening and supportive counseling.    Rodman Pickle Daci Stubbe, LCSW

## 2022-03-12 NOTE — Progress Notes (Unsigned)
Patient: Billy Roman Date of Birth: 08-15-1960  Reason for Visit: Follow up History from: Patient Primary Neurologist: Dr. Krista Blue   ASSESSMENT AND PLAN 61 y.o. year old male   1.  Bilateral hand action tremor 2.  Memory loss 3.  Rectal cancer, undergoing chemotherapy -[place] 17/30 today was 24/30 in April, but is poor historian today, trouble focusing, stressed about his employment situation; history significant for depression, stress -Will again place referral for neuropsychological evaluation given his memory concerns -Continue to work with PCP for management of mood disorder -Laboratory evaluation has not shown treatable etiology -MRI of the brain with and without contrast in March 2023 was normal, CT head August 2023 was unremarkable -Encouraged close follow-up in about 4 months at our office -I have concerns about his driving, I discussed this with him, have encouraged him to refrain from driving given his memory concerns  Orders Placed This Encounter  Procedures   Ambulatory referral to Neuropsychology   HISTORY  Billy Roman is a 61 year old male, seen in request by his primary care physician nurse practitioner Minette Brine, Amy for evaluation of bilateral hands tremor, initial evaluation was on September 18, 2021   I reviewed and summarized the referring note. PMHX. Depression Chronic insominia trazodone for sleep as needed,    Patient works as a Radiation protection practitioner support, but recently change to a different job, which has caused a lot of stress on him, during the training in February 2023, he describes difficulty keeping up with the requirement,   This is concurrent with his worsening depression  He suffered depression for few years, especially since he separated from his wife in 2018, complains of worsening depression at the beginning of 2023  Then he began to notice mild bilateral hands tremor, intermittent, also complains of memory loss,  difficulty keeping up with his stroke,   UDS March 2023 was positive for marijuana, oxazepam  Update March 13, 2022 SS: Is now being treated for rectal cancer with chemotherapy. Started December 19, 2021. Will be doing radiation as well.    Extensive laboratory evaluation with Dr. Krista Blue 09/18/21 RPR, HIV, CK, ANA, sed rate, CRP, MM panel, thyroid peroxidase antibody, thyroglobulin antibody, drug screen, ethanol were unremarkable with the exception of positive for marijuana  CT head 02/02/22 was unremarkable.  MRI of the brain with and without contrast March 2023 was unremarkable.  Here today with concerns of worsening memory loss. Trouble filling out paperwork, trouble with cognition. Yesterday he thinks he got terminated, was out on leave. Worried with COB[employer] paperwork. Tremor to both hands intermittently.   He has stopped smoking cigarettes, but does use CBD gummies. MOCA today 17/30.   Lives alone, is very heat sensitive, can't do his yard work. He has trouble getting all of his groceries bought. Has a daughter who lives close, another in [place].   He takes Decadron 2 days after chemo to help with fatigue and nausea.  Takes trazodone to help him sleep. Not sleeping well.   His ex-wife has MS and is in the nursing home.   REVIEW OF SYSTEMS: Out of a complete 14 system review of symptoms, the patient complains only of the following symptoms, and all other reviewed systems are negative.  See HPI  ALLERGIES: Allergies  Allergen Reactions   Propyphenazone Anaphylaxis, Itching and Rash    Felt hot    HOME MEDICATIONS: Outpatient Medications Prior to Visit  Medication Sig Dispense Refill   atomoxetine (STRATTERA) 10 MG capsule Take  10 mg by mouth daily.     dexamethasone (DECADRON) 4 MG tablet Take 4 mg twice daily x 2 days after each chemotherapy treatment starting on day 2 of each cycle 24 tablet 0   gabapentin (NEURONTIN) 300 MG capsule Take 100 mg by mouth 2 (two) times daily as needed  (neuropathy).     HYDROcodone-acetaminophen (NORCO/VICODIN) 5-325 MG tablet Take 1-2 tablets by mouth every 6 (six) hours as needed. 30 tablet 0   lidocaine-prilocaine (EMLA) cream Apply 1 Application topically as needed. 30 g 0   ondansetron (ZOFRAN-ODT) 4 MG disintegrating tablet Take 1 tablet (4 mg total) by mouth every 8 (eight) hours as needed for nausea or vomiting. 20 tablet 0   prochlorperazine (COMPAZINE) 10 MG tablet Take 1 tablet (10 mg total) by mouth every 6 (six) hours as needed for nausea or vomiting. 30 tablet 0   traZODone (DESYREL) 100 MG tablet Take 100 mg by mouth at bedtime.     venlafaxine XR (EFFEXOR-XR) 37.5 MG 24 hr capsule Take 37.5 mg by mouth daily.     No facility-administered medications prior to visit.    PAST MEDICAL HISTORY: Past Medical History:  Diagnosis Date   Anxiety    Arrhythmia    Complication of anesthesia    Nausea   [name], recurrent severe, without psychosis ([facility]) 08/23/2021   Pneumonia    Rectal cancer ([facility]) 11/20/2021   Sleep apnea    PAST SURGICAL HISTORY: Past Surgical History:  Procedure Laterality Date   OPERATIVE ULTRASOUND Right 12/17/2021   Procedure: OPERATIVE ULTRASOUND;  Surgeon: Ileana Roup, MD;  Location: [facility];  Service: General;  Laterality: Right;   PORTACATH PLACEMENT Right 12/17/2021   Procedure: INSERTION PORT-A-CATH;  Surgeon: Ileana Roup, MD;  Location: [facility];  Service: General;  Laterality: Right;   RHINOPLASTY     SEPTOPLASTY     SHOULDER ARTHROSCOPY Left    SHOULDER ARTHROSCOPY WITH OPEN ROTATOR CUFF REPAIR AND DISTAL CLAVICLE ACROMINECTOMY Right 02/01/2021   Procedure: RIGHT SHOULDER ARTHROSCOPY WITH MINI OPEN ROTATOR CUFF REPAIR AND DISTAL CLAVICLE EXCISION;  Surgeon: Thornton Park, MD;  Location: [facility];  Service: Orthopedics;  Laterality: Right;   SPINE SURGERY     lumbar laminectomy   WRIST SURGERY Bilateral    corpal tunnel    FAMILY HISTORY: Family History   Adopted: Yes  Problem Relation Age of Onset   Colon cancer Neg Hx    Stomach cancer Neg Hx    Esophageal cancer Neg Hx     SOCIAL HISTORY: Social History   Socioeconomic History   Marital status: Single    Spouse name: Not on file   Number of children: 2   Years of education: Not on file   Highest education level: Not on file  Occupational History   Occupation: tech support  Tobacco Use   Smoking status: Every Day    Packs/day: 1.00    Years: 30.00    Total pack years: 30.00    Types: Cigarettes    Last attempt to quit: 11/21/2020    Years since quitting: 1.3    Passive exposure: Never   Smokeless tobacco: Former    Types: Chew    Quit date: 1990  Vaping Use   Vaping Use: Never used  Substance and Sexual Activity   Alcohol use: Yes    Alcohol/week: 2.0 standard drinks of alcohol    Types: 2 Standard drinks or equivalent per week   Drug use: Yes  Frequency: 1.0 times per week    Types: Marijuana    Comment: Gummies   Sexual activity: Not Currently  Other Topics Concern   Not on file  Social History Narrative   Divorced, 2 daughters   Works in Designer, multimedia support for [employer]   1 alcoholic drink a day smokes cigarettes, uses marijuana, 0-1 caffeinated beverages daily   Social Determinants of Health   Financial Resource Strain: Medium Risk (01/15/2022)   Overall Financial Resource Strain (CARDIA)    Difficulty of Paying Living Expenses: Somewhat hard  Food Insecurity: Food Insecurity Present (01/15/2022)   Hunger Vital Sign    Worried About Running Out of Food in the Last Year: Sometimes true    Ran Out of Food in the Last Year: Sometimes true  Transportation Needs: No Transportation Needs (01/15/2022)   PRAPARE - Hydrologist (Medical): No    Lack of Transportation (Non-Medical): No  Physical Activity: Not on file  Stress: Not on file  Social Connections: Not on file  Intimate Partner Violence: Not on file    PHYSICAL EXAM  Vitals:    03/13/22 0813  BP: (!) 143/84  Pulse: 98   There is no height or weight on file to calculate BMI.    03/13/2022    8:22 AM 09/18/2021    2:40 PM  Montreal Cognitive Assessment   Visuospatial/ Executive (0/5) 4 5  Naming (0/3) 3 3  Attention: Read list of digits (0/2) 1 2  Attention: Read list of letters (0/1) 1 1  Attention: Serial 7 subtraction starting at 100 (0/3) 0 3  Language: Repeat phrase (0/2) 2 1  Language : Fluency (0/1) 0 0  Abstraction (0/2) 2 2  Delayed Recall (0/5) 0 2  Orientation (0/6) 4 5  Total 17 24   Generalized: Well developed, in no acute distress, somewhat disheveled, anxious Neurological examination  Mentation: Alert, oriented, some trouble focusing, able to follow exam commands, appears frazzled but relaxes through the visit Cranial nerve II-XII: Pupils were equal round reactive to light. Extraocular movements were full, visual field were full on confrontational test. Facial sensation and strength were normal. Head turning and shoulder shrug were normal and symmetric, but hesitant to lift the left shoulder. Motor: The motor testing reveals 5 over 5 strength of all 4 extremities. Good symmetric motor tone is noted throughout.  Mild bradykinesia.  Occasional jerk of the left arm. Sensory: Sensory testing is intact to soft touch on all 4 extremities. No evidence of extinction is noted.  Coordination: Cerebellar testing reveals good finger-nose-finger and heel-to-shin bilaterally.  Mild tremor with finger-nose-finger bilaterally, slightly more on the left.  Mild tremor translated to spiral drawl. Gait and station: Gait is slightly wide-based, otherwise steady and independent, he is cautious Reflexes: Deep tendon reflexes are symmetric and normal bilaterally.   DIAGNOSTIC DATA (LABS, IMAGING, TESTING) - I reviewed patient records, labs, notes, testing and imaging myself where available.  Lab Results  Component Value Date   WBC 5.5 03/12/2022   HGB 14.3  03/12/2022   HCT 44.0 03/12/2022   MCV 93.4 03/12/2022   PLT 125 (L) 03/12/2022      Component Value Date/Time   NA 140 03/12/2022 0955   NA 139 01/15/2021 1032   K 4.3 03/12/2022 0955   CL 105 03/12/2022 0955   CO2 26 03/12/2022 0955   GLUCOSE 83 03/12/2022 0955   BUN 14 03/12/2022 0955   BUN 11 01/15/2021 1032   CREATININE 0.74  03/12/2022 0955   CALCIUM 9.1 03/12/2022 0955   PROT 6.9 03/12/2022 0955   PROT 7.5 09/18/2021 1603   ALBUMIN 4.0 03/12/2022 0955   ALBUMIN 4.6 01/15/2021 1032   AST 38 03/12/2022 0955   ALT 46 (H) 03/12/2022 0955   ALKPHOS 75 03/12/2022 0955   BILITOT 0.4 03/12/2022 0955   GFRNONAA >60 03/12/2022 0955   GFRAA 117 07/26/2019 1218   Lab Results  Component Value Date   CHOL 147 08/22/2021   HDL 49 08/22/2021   LDLCALC 76 08/22/2021   TRIG 109 08/22/2021   CHOLHDL 3.0 08/22/2021   Lab Results  Component Value Date   HGBA1C 5.2 08/22/2021   Lab Results  Component Value Date   VPCHEKBT24 818 01/15/2021   Lab Results  Component Value Date   TSH 1.514 08/22/2021    Butler Denmark, AGNP-C, DNP 03/13/2022, 8:33 AM [facility] [address], [place], [place] [address] [phone]

## 2022-03-12 NOTE — Progress Notes (Signed)
  [facility] OFFICE PROGRESS NOTE   Diagnosis: Rectal cancer  INTERVAL HISTORY:   Billy Roman completed on cycle of FOLFOX on 02/26/2022.  He had mild nausea following chemotherapy.  He has cold sensitivity following chemotherapy he has noted a mild increase in peripheral neuropathy symptoms since beginning chemotherapy.  He is having bowel movements.  Objective:  Vital signs in last 24 hours:  Blood pressure 136/83, pulse 82, temperature 98.2 F (36.8 C), temperature source Oral, resp. rate 18, height '6\' 1"'$  (1.854 m), weight 272 lb 4.8 oz (123.5 kg), SpO2 100 %.    HEENT: No thrush or ulcers Resp: Lungs with distant breath sounds, end inspiratory rhonchi at the lower posterior chest bilaterally, no respiratory distress Cardio: Regular rate and rhythm GI: No hepatosplenomegaly, mild tenderness in the bilateral low abdomen, no mass Vascular: No leg edema Neuro: Very mild loss of vibratory sense at the fingertips bilaterally Skin: Mild erythematous rash at the forearms  Portacath/PICC-without erythema  Lab Results:  Lab Results  Component Value Date   WBC 5.4 02/26/2022   HGB 14.9 02/26/2022   HCT 44.4 02/26/2022   MCV 91.7 02/26/2022   PLT 102 (L) 02/26/2022   NEUTROABS 2.5 02/26/2022    CMP  Lab Results  Component Value Date   NA 139 02/26/2022   K 4.0 02/26/2022   CL 104 02/26/2022   CO2 25 02/26/2022   GLUCOSE 121 (H) 02/26/2022   BUN 14 02/26/2022   CREATININE 0.70 02/26/2022   CALCIUM 9.3 02/26/2022   PROT 7.0 02/26/2022   ALBUMIN 4.1 02/26/2022   AST 38 02/26/2022   ALT 37 02/26/2022   ALKPHOS 77 02/26/2022   BILITOT 0.5 02/26/2022   GFRNONAA >60 02/26/2022   GFRAA 117 07/26/2019    Lab Results  Component Value Date   CEA 3.65 12/14/2021    Medications: I have reviewed the patient's current medications.   Assessment/Plan:  Adenocarcinoma of the proximal rectum/distal sigmoid Colonoscopy 11/19/2021-partially obstructing mass at  12-19 cm, biopsy moderate to poorly differentiated adenocarcinoma Mildly elevated CEA CTs 11/29/2021-mural thickening in the distal sigmoid/proximal rectum with haziness of the mesorectal fat, mesorectal lymphadenopathy, small pulmonary nodules favored to be benign MRI pelvis 12/03/2021-tumor at 10 cm from the anal verge, T3c, approximately 10 metastatic nodes in the mesorectal sheath and presacral space, no extra mesorectal lymphadenopathy, N2 Cycle 1 FOLFOX 12/19/2021 Cycle 2 FOLFOX 01/02/2022, home Decadron prophylaxis added for delayed nausea Cycle 3 FOLFOX 01/15/2022 Cycle 4 FOLFOX 01/29/2022, 5-FU bolus, leucovorin, and oxaliplatin dose reduced secondary to neutropenia and thrombocytopenia Cycle 5 FOLFOX 02/12/2022, Udenyca Cycle 6 FOLFOX 02/26/2022, Udenyca Cycle 7 FOLFOX 03/12/2022, Udenyca  2.   Major depressive disorder Change in bowel habits and rectal bleeding secondary to #1 Ongoing tobacco use Report of blurred vision and diplopia Peripheral neuropathy-fingers and left foot   Disposition: Billy Roman has completed 6 cycles of FOLFOX.  He is tolerated the chemotherapy well.  He has minimal neuropathy symptoms.  The plan is to proceed with cycle 7 FOLFOX today.  He will return for an office visit and chemotherapy in 2 weeks.  He will be referred for concurrent capecitabine and radiation after cycle 8 FOLFOX.  [name], MD  03/12/2022  10:38 AM

## 2022-03-13 ENCOUNTER — Telehealth: Payer: Self-pay | Admitting: Neurology

## 2022-03-13 ENCOUNTER — Ambulatory Visit: Payer: BC Managed Care – PPO | Admitting: Physical Therapy

## 2022-03-13 ENCOUNTER — Encounter: Payer: Self-pay | Admitting: Neurology

## 2022-03-13 ENCOUNTER — Ambulatory Visit (INDEPENDENT_AMBULATORY_CARE_PROVIDER_SITE_OTHER): Payer: BC Managed Care – PPO | Admitting: Neurology

## 2022-03-13 VITALS — BP 143/84 | HR 98

## 2022-03-13 DIAGNOSIS — F332 Major depressive disorder, recurrent severe without psychotic features: Secondary | ICD-10-CM | POA: Diagnosis not present

## 2022-03-13 DIAGNOSIS — R251 Tremor, unspecified: Secondary | ICD-10-CM | POA: Diagnosis not present

## 2022-03-13 DIAGNOSIS — Z0289 Encounter for other administrative examinations: Secondary | ICD-10-CM

## 2022-03-13 DIAGNOSIS — R4189 Other symptoms and signs involving cognitive functions and awareness: Secondary | ICD-10-CM

## 2022-03-13 DIAGNOSIS — R413 Other amnesia: Secondary | ICD-10-CM

## 2022-03-13 NOTE — Telephone Encounter (Signed)
Referral for neuropsychology sent to [facility]. Phone: [phone], Fax: [phone].

## 2022-03-13 NOTE — Patient Instructions (Addendum)
Referral for neuropsychological evaluation  I have concerns about your driving given your memory issues, I would recommend against this until we get a better understanding of what is going on See you back in 4-5 months

## 2022-03-14 ENCOUNTER — Other Ambulatory Visit: Payer: Self-pay

## 2022-03-14 ENCOUNTER — Inpatient Hospital Stay: Payer: BC Managed Care – PPO

## 2022-03-14 VITALS — BP 132/87 | HR 79 | Temp 98.3°F | Resp 20

## 2022-03-14 DIAGNOSIS — R21 Rash and other nonspecific skin eruption: Secondary | ICD-10-CM | POA: Diagnosis not present

## 2022-03-14 DIAGNOSIS — D6959 Other secondary thrombocytopenia: Secondary | ICD-10-CM | POA: Diagnosis not present

## 2022-03-14 DIAGNOSIS — G620 Drug-induced polyneuropathy: Secondary | ICD-10-CM | POA: Diagnosis not present

## 2022-03-14 DIAGNOSIS — Z720 Tobacco use: Secondary | ICD-10-CM | POA: Diagnosis not present

## 2022-03-14 DIAGNOSIS — T451X5A Adverse effect of antineoplastic and immunosuppressive drugs, initial encounter: Secondary | ICD-10-CM | POA: Diagnosis not present

## 2022-03-14 DIAGNOSIS — R970 Elevated carcinoembryonic antigen [CEA]: Secondary | ICD-10-CM | POA: Diagnosis not present

## 2022-03-14 DIAGNOSIS — C20 Malignant neoplasm of rectum: Secondary | ICD-10-CM

## 2022-03-14 DIAGNOSIS — Z5111 Encounter for antineoplastic chemotherapy: Secondary | ICD-10-CM | POA: Diagnosis not present

## 2022-03-14 MED ORDER — SODIUM CHLORIDE 0.9% FLUSH
10.0000 mL | INTRAVENOUS | Status: DC | PRN
  Administered 2022-03-14: 10 mL

## 2022-03-14 MED ORDER — PEGFILGRASTIM-CBQV 6 MG/0.6ML ~~LOC~~ SOSY
6.0000 mg | PREFILLED_SYRINGE | Freq: Once | SUBCUTANEOUS | Status: AC
  Administered 2022-03-14: 6 mg via SUBCUTANEOUS
  Filled 2022-03-14: qty 0.6

## 2022-03-14 MED ORDER — HEPARIN SOD (PORK) LOCK FLUSH 100 UNIT/ML IV SOLN
500.0000 [IU] | Freq: Once | INTRAVENOUS | Status: AC | PRN
  Administered 2022-03-14: 500 [IU]

## 2022-03-14 NOTE — Patient Instructions (Signed)
Heparin injection What is this medication? HEPARIN (HEP a rin) is an anticoagulant. It is used to treat or prevent clots in the veins, arteries, lungs, or heart. It stops clots from forming or getting bigger. This medicine prevents clotting during open-heart surgery, dialysis, or in patients who are confined to bed. This medicine may be used for other purposes; ask your health care provider or pharmacist if you have questions. COMMON BRAND NAME(S): Hep-Lock, Hep-Lock U/P, Hepflush-10, Monoject Prefill Advanced Heparin Lock Flush, SASH Normal Saline and Heparin What should I tell my care team before I take this medication? They need to know if you have any of these conditions: bleeding disorders, such as hemophilia or low blood platelets bowel disease or diverticulitis endocarditis high blood pressure liver disease recent surgery or delivery of a baby stomach ulcers an unusual or allergic reaction to heparin, benzyl alcohol, sulfites, other medicines, foods, dyes, or preservatives pregnant or trying to get pregnant breast-feeding How should I use this medication? This medicine is given by injection or infusion into a vein. It can also be given by injection of small amounts under the skin. It is usually given by a health care professional in a hospital or clinic setting. If you get this medicine at home, you will be taught how to prepare and give this medicine. Use exactly as directed. Take your medicine at regular intervals. Do not take it more often than directed. Do not stop taking except on your doctor's advice. Stopping this medicine may increase your risk of a blot clot. Be sure to refill your prescription before you run out of medicine. It is important that you put your used needles and syringes in a special sharps container. Do not put them in a trash can. If you do not have a sharps container, call your pharmacist or healthcare provider to get one. Talk to your pediatrician regarding the  use of this medicine in children. While this medicine may be prescribed for children for selected conditions, precautions do apply. Overdosage: If you think you have taken too much of this medicine contact a poison control center or emergency room at once. NOTE: This medicine is only for you. Do not share this medicine with others. What if I miss a dose? If you miss a dose, take it as soon as you can. If it is almost time for your next dose, take only that dose. Do not take double or extra doses. What may interact with this medication? Do not take this medicine with any of the following medications: aspirin and aspirin-like drugs mifepristone medicines that treat or prevent blood clots like warfarin, enoxaparin, and dalteparin palifermin protamine This medicine may also interact with the following medications: dextran digoxin hydroxychloroquine medicines for treating colds or allergies nicotine NSAIDs, medicines for pain and inflammation, like ibuprofen or naproxen phenylbutazone tetracycline antibiotics This list may not describe all possible interactions. Give your health care provider a list of all the medicines, herbs, non-prescription drugs, or dietary supplements you use. Also tell them if you smoke, drink alcohol, or use illegal drugs. Some items may interact with your medicine. What should I watch for while using this medication? Visit your healthcare professional for regular checks on your progress. You may need blood work done while you are taking this medicine. Your condition will be monitored carefully while you are receiving this medicine. It is important not to miss any appointments. Wear a medical ID bracelet or chain, and carry a card that describes your disease and details   of your medicine and dosage times. Notify your doctor or healthcare professional at once if you have cold, blue hands or feet. If you are going to need surgery or other procedure, tell your healthcare  professional that you are using this medicine. Avoid sports and activities that might cause injury while you are using this medicine. Severe falls or injuries can cause unseen bleeding. Be careful when using sharp tools or knives. Consider using an electric razor. Take special care brushing or flossing your teeth. Report any injuries, bruising, or red spots on the skin to your healthcare professional. Using this medicine for a long time may weaken your bones and increase the risk of bone fractures. You should make sure that you get enough calcium and vitamin D while you are taking this medicine. Discuss the foods you eat and the vitamins you take with your healthcare professional. Wear a medical ID bracelet or chain. Carry a card that describes your disease and details of your medicine and dosage times. What side effects may I notice from receiving this medication? Side effects that you should report to your doctor or health care professional as soon as possible: allergic reactions like skin rash, itching or hives, swelling of the face, lips, or tongue bone pain fever, chills nausea, vomiting signs and symptoms of bleeding such as bloody or black, tarry stools; red or dark-brown urine; spitting up blood or brown material that looks like coffee grounds; red spots on the skin; unusual bruising or bleeding from the eye, gums, or nose signs and symptoms of a blood clot such as chest pain; shortness of breath; pain, swelling, or warmth in the leg signs and symptoms of a stroke such as changes in vision; confusion; trouble speaking or understanding; severe headaches; sudden numbness or weakness of the face, arm or leg; trouble walking; dizziness; loss of coordination Side effects that usually do not require medical attention (report to your doctor or health care professional if they continue or are bothersome): hair loss pain, redness, or irritation at site where injected This list may not describe all  possible side effects. Call your doctor for medical advice about side effects. You may report side effects to FDA at 1-800-FDA-1088. Where should I keep my medication? Keep out of the reach of children. Store unopened vials at room temperature between 15 and 30 degrees C (59 and 86 degrees F). Do not freeze. Do not use if solution is discolored or particulate matter is present. Throw away any unused medicine after the expiration date. NOTE: This sheet is a summary. It may not cover all possible information. If you have questions about this medicine, talk to your doctor, pharmacist, or health care provider.  2023 Elsevier/Gold Standard (2005-03-11 00:00:00)  

## 2022-03-15 ENCOUNTER — Ambulatory Visit: Payer: BC Managed Care – PPO | Admitting: Physical Therapy

## 2022-03-18 ENCOUNTER — Encounter: Payer: Self-pay | Admitting: Internal Medicine

## 2022-03-18 ENCOUNTER — Ambulatory Visit: Payer: BC Managed Care – PPO | Attending: Radiation Oncology | Admitting: Physical Therapy

## 2022-03-18 DIAGNOSIS — R279 Unspecified lack of coordination: Secondary | ICD-10-CM | POA: Insufficient documentation

## 2022-03-18 DIAGNOSIS — M6281 Muscle weakness (generalized): Secondary | ICD-10-CM | POA: Insufficient documentation

## 2022-03-18 DIAGNOSIS — R293 Abnormal posture: Secondary | ICD-10-CM | POA: Insufficient documentation

## 2022-03-19 DIAGNOSIS — Z0271 Encounter for disability determination: Secondary | ICD-10-CM

## 2022-03-20 ENCOUNTER — Telehealth: Payer: Self-pay | Admitting: *Deleted

## 2022-03-20 NOTE — Telephone Encounter (Signed)
Billy Roman left VM reporting more fatigue with last treatment as well as bone pain. So fatigued he could not leave house--today is better. Asking if the fatigue will worsen with each treatment? Informed him that the fatigue w/chemo can be cumulative. Needs to balance rest with work and push po fluids (limit caffeine and not too many sugary drinks). Push protein in drinks and foods. Informed him that fatigue is not due to anemia (Hgb normal). He is asking if he should still have the gcsf at the next treatment? Suggested he make a list of his questions to discuss at next visit.

## 2022-03-20 NOTE — Telephone Encounter (Signed)
Pt needs to take [place] form to [facility] to be completed by them.

## 2022-03-21 ENCOUNTER — Telehealth: Payer: Self-pay

## 2022-03-21 DIAGNOSIS — C20 Malignant neoplasm of rectum: Secondary | ICD-10-CM | POA: Diagnosis not present

## 2022-03-21 NOTE — Telephone Encounter (Signed)
If I can further assist patient notify me.

## 2022-03-21 NOTE — Telephone Encounter (Signed)
Spoke to pt about [employer] forms that were received, pt stated that he is out for long term disability due to cancer, states that [employer] has sent forms to all of his treating providers, pt was advised that Minette Brine, FNP will not be able to complete form that was sent, pt stated that he doesn't remember the last time he seen her, so he wasn't expecting her to. I did advise pt that medical record request that [facility] sent was sent to HIM  -pt appreciated the call and wanted to advise Ms. Minette Brine, [place] that a tumor was found when he went for his colonoscopy

## 2022-03-22 ENCOUNTER — Ambulatory Visit: Payer: BC Managed Care – PPO | Admitting: Physical Therapy

## 2022-03-23 ENCOUNTER — Other Ambulatory Visit: Payer: Self-pay | Admitting: Oncology

## 2022-03-25 ENCOUNTER — Telehealth: Payer: Self-pay | Admitting: Physical Therapy

## 2022-03-25 ENCOUNTER — Ambulatory Visit: Payer: BC Managed Care – PPO | Admitting: Physical Therapy

## 2022-03-25 NOTE — Telephone Encounter (Signed)
PT spoke with pt and he has been very sick and fatigued with cancer treatments and has not been able to leave home for PT appointments. Pt understands he is he continuing to work on [employer] and walking program and plans to follow up with PT after radiation/chemo treatments.    Stacy Gardner, PT, DPT 10/09/233:28 PM

## 2022-03-26 ENCOUNTER — Inpatient Hospital Stay: Payer: BC Managed Care – PPO | Attending: Oncology

## 2022-03-26 ENCOUNTER — Inpatient Hospital Stay (HOSPITAL_BASED_OUTPATIENT_CLINIC_OR_DEPARTMENT_OTHER): Payer: BC Managed Care – PPO | Admitting: Nurse Practitioner

## 2022-03-26 ENCOUNTER — Inpatient Hospital Stay: Payer: BC Managed Care – PPO

## 2022-03-26 ENCOUNTER — Encounter: Payer: Self-pay | Admitting: Nurse Practitioner

## 2022-03-26 VITALS — BP 140/80 | HR 80 | Temp 98.1°F | Resp 20 | Ht 73.0 in | Wt 274.6 lb

## 2022-03-26 DIAGNOSIS — Z452 Encounter for adjustment and management of vascular access device: Secondary | ICD-10-CM | POA: Insufficient documentation

## 2022-03-26 DIAGNOSIS — R970 Elevated carcinoembryonic antigen [CEA]: Secondary | ICD-10-CM | POA: Insufficient documentation

## 2022-03-26 DIAGNOSIS — G620 Drug-induced polyneuropathy: Secondary | ICD-10-CM | POA: Insufficient documentation

## 2022-03-26 DIAGNOSIS — D61810 Antineoplastic chemotherapy induced pancytopenia: Secondary | ICD-10-CM | POA: Insufficient documentation

## 2022-03-26 DIAGNOSIS — C20 Malignant neoplasm of rectum: Secondary | ICD-10-CM | POA: Insufficient documentation

## 2022-03-26 DIAGNOSIS — T451X5D Adverse effect of antineoplastic and immunosuppressive drugs, subsequent encounter: Secondary | ICD-10-CM | POA: Diagnosis not present

## 2022-03-26 DIAGNOSIS — Z720 Tobacco use: Secondary | ICD-10-CM | POA: Insufficient documentation

## 2022-03-26 DIAGNOSIS — Z5111 Encounter for antineoplastic chemotherapy: Secondary | ICD-10-CM | POA: Insufficient documentation

## 2022-03-26 LAB — CBC WITH DIFFERENTIAL (CANCER CENTER ONLY)
Abs Immature Granulocytes: 0.11 10*3/uL — ABNORMAL HIGH (ref 0.00–0.07)
Basophils Absolute: 0.1 10*3/uL (ref 0.0–0.1)
Basophils Relative: 1 %
Eosinophils Absolute: 0.4 10*3/uL (ref 0.0–0.5)
Eosinophils Relative: 7 %
HCT: 42.7 % (ref 39.0–52.0)
Hemoglobin: 14.2 g/dL (ref 13.0–17.0)
Immature Granulocytes: 2 %
Lymphocytes Relative: 28 %
Lymphs Abs: 1.7 10*3/uL (ref 0.7–4.0)
MCH: 31.7 pg (ref 26.0–34.0)
MCHC: 33.3 g/dL (ref 30.0–36.0)
MCV: 95.3 fL (ref 80.0–100.0)
Monocytes Absolute: 0.7 10*3/uL (ref 0.1–1.0)
Monocytes Relative: 11 %
Neutro Abs: 3.3 10*3/uL (ref 1.7–7.7)
Neutrophils Relative %: 51 %
Platelet Count: 90 10*3/uL — ABNORMAL LOW (ref 150–400)
RBC: 4.48 MIL/uL (ref 4.22–5.81)
RDW: 19 % — ABNORMAL HIGH (ref 11.5–15.5)
WBC Count: 6.3 10*3/uL (ref 4.0–10.5)
nRBC: 0 % (ref 0.0–0.2)

## 2022-03-26 LAB — CMP (CANCER CENTER ONLY)
ALT: 43 U/L (ref 0–44)
AST: 44 U/L — ABNORMAL HIGH (ref 15–41)
Albumin: 3.9 g/dL (ref 3.5–5.0)
Alkaline Phosphatase: 86 U/L (ref 38–126)
Anion gap: 8 (ref 5–15)
BUN: 13 mg/dL (ref 6–20)
CO2: 25 mmol/L (ref 22–32)
Calcium: 9.5 mg/dL (ref 8.9–10.3)
Chloride: 108 mmol/L (ref 98–111)
Creatinine: 0.71 mg/dL (ref 0.61–1.24)
GFR, Estimated: >60 mL/min (ref >60)
Glucose, Bld: 131 mg/dL — ABNORMAL HIGH (ref 70–99)
Potassium: 4 mmol/L (ref 3.5–5.1)
Sodium: 141 mmol/L (ref 135–145)
Total Bilirubin: 0.4 mg/dL (ref 0.3–1.2)
Total Protein: 7.3 g/dL (ref 6.5–8.1)

## 2022-03-26 MED ORDER — DEXTROSE 5 % IV SOLN: Freq: Once | INTRAVENOUS | Status: AC

## 2022-03-26 MED ORDER — LEUCOVORIN CALCIUM INJECTION 350 MG
300.0000 mg/m2 | Freq: Once | INTRAVENOUS | Status: AC
  Administered 2022-03-26: 732 mg via INTRAVENOUS
  Filled 2022-03-26: qty 25

## 2022-03-26 MED ORDER — FLUOROURACIL CHEMO INJECTION 2.5 GM/50ML
300.0000 mg/m2 | Freq: Once | INTRAVENOUS | Status: AC
  Administered 2022-03-26: 750 mg via INTRAVENOUS
  Filled 2022-03-26: qty 15

## 2022-03-26 MED ORDER — SODIUM CHLORIDE 0.9 % IV SOLN
5000.0000 mg | INTRAVENOUS | Status: DC
  Administered 2022-03-26: 5000 mg via INTRAVENOUS
  Filled 2022-03-26: qty 100

## 2022-03-26 NOTE — Progress Notes (Unsigned)
[facility] OFFICE PROGRESS NOTE   Diagnosis: Rectal cancer  INTERVAL HISTORY:   Billy Roman returns as scheduled.  He completed cycle 7 FOLFOX 03/12/2022.  He had more nausea following the chemotherapy that he typically has.  No mouth sores.  1 or 2 loose stools over the past few days.  He has mild persistent cold sensitivity in the fingertips.  He reports persistent tingling in both hands unrelated to cold exposure.  Progressive fatigue.  Alteration in taste.  He reports bilateral achy shoulder pain.  Objective:  Vital signs in last 24 hours:  Blood pressure (!) 140/80, pulse 80, temperature 98.1 F (36.7 C), temperature source Oral, resp. rate 20, height '6\' 1"'$  (1.854 m), weight 274 lb 9.6 oz (124.6 kg), SpO2 98 %.    HEENT: No thrush or ulcers. Resp: Lungs clear bilaterally. Cardio: Regular rate and rhythm. GI: No hepatosplenomegaly. Vascular: No leg edema. Neuro: Vibratory sense mildly decreased over the fingertips per tuning fork exam. Skin: Palms without erythema. Port-A-Cath without erythema.   Lab Results:  Lab Results  Component Value Date   WBC 6.3 03/26/2022   HGB 14.2 03/26/2022   HCT 42.7 03/26/2022   MCV 95.3 03/26/2022   PLT 90 (L) 03/26/2022   NEUTROABS 3.3 03/26/2022    Imaging:  No results found.  Medications: I have reviewed the patient's current medications.  Assessment/Plan: Adenocarcinoma of the proximal rectum/distal sigmoid Colonoscopy 11/19/2021-partially obstructing mass at 12-19 cm, biopsy moderate to poorly differentiated adenocarcinoma Mildly elevated CEA CTs 11/29/2021-mural thickening in the distal sigmoid/proximal rectum with haziness of the mesorectal fat, mesorectal lymphadenopathy, small pulmonary nodules favored to be benign MRI pelvis 12/03/2021-tumor at 10 cm from the anal verge, T3c, approximately 10 metastatic nodes in the mesorectal sheath and presacral space, no extra mesorectal lymphadenopathy, N2 Cycle 1  FOLFOX 12/19/2021 Cycle 2 FOLFOX 01/02/2022, home Decadron prophylaxis added for delayed nausea Cycle 3 FOLFOX 01/15/2022 Cycle 4 FOLFOX 01/29/2022, 5-FU bolus, leucovorin, and oxaliplatin dose reduced secondary to neutropenia and thrombocytopenia Cycle 5 FOLFOX 02/12/2022, Udenyca Cycle 6 FOLFOX 02/26/2022, Udenyca Cycle 7 FOLFOX 03/12/2022, Udenyca  Cycle 8 FOLFOX 03/26/2022, oxaliplatin held due to neuropathy, Udenyca held 2.   Major depressive disorder Change in bowel habits and rectal bleeding secondary to #1 Ongoing tobacco use Report of blurred vision and diplopia Peripheral neuropathy-fingers and left foot    Disposition: Billy Roman appears stable.  He has completed 7 cycles of neoadjuvant FOLFOX.  He has progressive neuropathy symptoms.  He understands symptoms could worsen with continuation of oxaliplatin.  He does not want to receive oxaliplatin with today's treatment.  Plan to proceed with 5-FU/leucovorin as scheduled.  CBC and chemistry panel reviewed.  Labs adequate to proceed with treatment.  He has mild thrombocytopenia.  He is scheduled for radiation simulation 04/12/2022.  We discussed concurrent Xeloda.  We reviewed potential side effects associated with Xeloda including bone marrow toxicity, nausea, mouth sores, diarrhea, hand-foot syndrome, skin rash, increased sensitivity to sun, skin hyperpigmentation.  He agrees to proceed.  He understands he does not begin Xeloda until the first day of radiation, 04/22/2022.  We will see him in follow-up on 04/19/2022.  We are available to see him sooner if needed.  Patient seen with Dr. Benay Spice.    Ned Card ANP/GNP-BC   03/26/2022  8:44 AM This was a shared visit with Ned Card.  Billy Roman has developed progressive neuropathy symptoms.  We discussed the risk/benefit of continuing oxaliplatin with this cycle.  We decided  to discontinue oxaliplatin with cycle 8 FOLFOX.  He will complete a final cycle of chemotherapy today.   The plan is to begin concurrent capecitabine and radiation in 2-3 weeks.  We reviewed potential toxicities associated with capecitabine.  He agrees to proceed.  A capecitabine prescription was entered.  I was present for greater than 50% of today's visit.  I performed medical decision making.  Julieanne Manson, MD

## 2022-03-26 NOTE — Progress Notes (Unsigned)
Patient seen by Ned Card NP today  Vitals are within treatment parameters.  Labs reviewed by Ned Card NP and are not all within treatment parameters. Platelets are 90. Per Lattie Haw it's okay to treat.  Per physician team, patient is ready for treatment. Please note that modifications are being made to the treatment plan including no oxaliplatin with today treatment.

## 2022-03-26 NOTE — Patient Instructions (Signed)
[facility]   The chemotherapy medication bag should finish at 46 hours, 96 hours, or 7 days. For example, if your pump is scheduled for 46 hours and it was put on at 4:00 p.m., it should finish at 2:00 p.m. the day it is scheduled to come off regardless of your appointment time.     Estimated time to finish at 9:30 Thursday March 28, 2022.   If the display on your pump reads "Low Volume" and it is beeping, take the batteries out of the pump and come to the cancer center for it to be taken off.   If the pump alarms go off prior to the pump reading "Low Volume" then call [phone] and someone can assist you.  If the plunger comes out and the chemotherapy medication is leaking out, please use your home chemo spill kit to clean up the spill. Do NOT use paper towels or other household products.  If you have problems or questions regarding your pump, please call either [phone] (24 hours a day) or the cancer center Monday-Friday 8:00 a.m.- 4:30 p.m. at the clinic number and we will assist you. If you are unable to get assistance, then go to the nearest Emergency Department and ask the staff to contact the IV team for assistance.  Discharge Instructions: Thank you for choosing [facility] to provide your oncology and hematology care.   If you have a lab appointment with the [facility], please go directly to the [facility] and check in at the registration area.   Wear comfortable clothing and clothing appropriate for easy access to any Portacath or PICC line.   We strive to give you quality time with your provider. You may need to reschedule your appointment if you arrive late (15 or more minutes).  Arriving late affects you and other patients whose appointments are after yours.  Also, if you miss three or more appointments without notifying the office, you may be dismissed from the clinic at the provider's discretion.      For prescription  refill requests, have your pharmacy contact our office and allow 72 hours for refills to be completed.    Today you received the following chemotherapy and/or immunotherapy agentLeucovorin, Fluorouracil s       To help prevent nausea and vomiting after your treatment, we encourage you to take your nausea medication as directed.  BELOW ARE SYMPTOMS THAT SHOULD BE REPORTED IMMEDIATELY: *FEVER GREATER THAN 100.4 F (38 C) OR HIGHER *CHILLS OR SWEATING *NAUSEA AND VOMITING THAT IS NOT CONTROLLED WITH YOUR NAUSEA MEDICATION *UNUSUAL SHORTNESS OF BREATH *UNUSUAL BRUISING OR BLEEDING *URINARY PROBLEMS (pain or burning when urinating, or frequent urination) *BOWEL PROBLEMS (unusual diarrhea, constipation, pain near the anus) TENDERNESS IN MOUTH AND THROAT WITH OR WITHOUT PRESENCE OF ULCERS (sore throat, sores in mouth, or a toothache) UNUSUAL RASH, SWELLING OR PAIN  UNUSUAL VAGINAL DISCHARGE OR ITCHING   Items with * indicate a potential emergency and should be followed up as soon as possible or go to the Emergency Department if any problems should occur.  Please show the CHEMOTHERAPY ALERT CARD or IMMUNOTHERAPY ALERT CARD at check-in to the Emergency Department and triage nurse.  Should you have questions after your visit or need to cancel or reschedule your appointment, please contact [facility]  Dept: [phone]  and follow the prompts.  Office hours are 8:00 a.m. to 4:30 p.m. Monday - Friday. Please note that voicemails  left after 4:00 p.m. may not be returned until the following business day.  We are closed weekends and major holidays. You have access to a nurse at all times for urgent questions. Please call the main number to the clinic Dept: [phone] and follow the prompts.   For any non-urgent questions, you may also contact your provider using MyChart. We now offer e-Visits for anyone 50 and older to request care online for non-urgent symptoms. For details  visit [email or web address].   Also download the MyChart app! Go to the app store, search "MyChart", open the app, select , and log in with your MyChart username and password.  Masks are optional in the cancer centers. If you would like for your care team to wear a mask while they are taking care of you, please let them know. You may have one support person who is at least 59 years old accompany you for your appointments. Leucovorin Injection What is this medication? LEUCOVORIN (loo koe VOR in) prevents side effects from certain medications, such as methotrexate. It works by increasing folate levels. This helps protect healthy cells in your body. It may also be used to treat anemia caused by low levels of folate. It can also be used with fluorouracil, a type of chemotherapy, to treat colorectal cancer. It works by increasing the effects of fluorouracil in the body. This medicine may be used for other purposes; ask your health care provider or pharmacist if you have questions. What should I tell my care team before I take this medication? They need to know if you have any of these conditions: Anemia from low levels of vitamin B12 in the blood An unusual or allergic reaction to leucovorin, folic acid, other medications, foods, dyes, or preservatives Pregnant or trying to get pregnant Breastfeeding How should I use this medication? This medication is injected into a vein or a muscle. It is given by your care team in a hospital or clinic setting. Talk to your care team about the use of this medication in children. Special care may be needed. Overdosage: If you think you have taken too much of this medicine contact a poison control center or emergency room at once. NOTE: This medicine is only for you. Do not share this medicine with others. What if I miss a dose? Keep appointments for follow-up doses. It is important not to miss your dose. Call your care team if you are unable to keep an  appointment. What may interact with this medication? Capecitabine Fluorouracil Phenobarbital Phenytoin Primidone Trimethoprim;sulfamethoxazole This list may not describe all possible interactions. Give your health care provider a list of all the medicines, herbs, non-prescription drugs, or dietary supplements you use. Also tell them if you smoke, drink alcohol, or use illegal drugs. Some items may interact with your medicine. What should I watch for while using this medication? Your condition will be monitored carefully while you are receiving this medication. This medication may increase the side effects of 5-fluorouracil. Tell your care team if you have diarrhea or mouth sores that do not get better or that get worse. What side effects may I notice from receiving this medication? Side effects that you should report to your care team as soon as possible: Allergic reactions--skin rash, itching, hives, swelling of the face, lips, tongue, or throat This list may not describe all possible side effects. Call your doctor for medical advice about side effects. You may report side effects to FDA at 1-800-FDA-1088. Where should I keep my  medication? This medication is given in a hospital or clinic. It will not be stored at home. NOTE: This sheet is a summary. It may not cover all possible information. If you have questions about this medicine, talk to your doctor, pharmacist, or health care provider.  2023 Elsevier/Gold Standard (2021-10-12 00:00:00)  Fluorouracil Injection What is this medication? FLUOROURACIL (flure oh YOOR a sil) treats some types of cancer. It works by slowing down the growth of cancer cells. This medicine may be used for other purposes; ask your health care provider or pharmacist if you have questions. COMMON BRAND NAME(S): Adrucil What should I tell my care team before I take this medication? They need to know if you have any of these conditions: Blood  disorders Dihydropyrimidine dehydrogenase (DPD) deficiency Infection, such as chickenpox, cold sores, herpes Kidney disease Liver disease Poor nutrition Recent or ongoing radiation therapy An unusual or allergic reaction to fluorouracil, other medications, foods, dyes, or preservatives If you or your partner are pregnant or trying to get pregnant Breast-feeding How should I use this medication? This medication is injected into a vein. It is administered by your care team in a hospital or clinic setting. Talk to your care team about the use of this medication in children. Special care may be needed. Overdosage: If you think you have taken too much of this medicine contact a poison control center or emergency room at once. NOTE: This medicine is only for you. Do not share this medicine with others. What if I miss a dose? Keep appointments for follow-up doses. It is important not to miss your dose. Call your care team if you are unable to keep an appointment. What may interact with this medication? Do not take this medication with any of the following: Live virus vaccines This medication may also interact with the following: Medications that treat or prevent blood clots, such as warfarin, enoxaparin, dalteparin This list may not describe all possible interactions. Give your health care provider a list of all the medicines, herbs, non-prescription drugs, or dietary supplements you use. Also tell them if you smoke, drink alcohol, or use illegal drugs. Some items may interact with your medicine. What should I watch for while using this medication? Your condition will be monitored carefully while you are receiving this medication. This medication may make you feel generally unwell. This is not uncommon as chemotherapy can affect healthy cells as well as cancer cells. Report any side effects. Continue your course of treatment even though you feel ill unless your care team tells you to stop. In some  cases, you may be given additional medications to help with side effects. Follow all directions for their use. This medication may increase your risk of getting an infection. Call your care team for advice if you get a fever, chills, sore throat, or other symptoms of a cold or flu. Do not treat yourself. Try to avoid being around people who are sick. This medication may increase your risk to bruise or bleed. Call your care team if you notice any unusual bleeding. Be careful brushing or flossing your teeth or using a toothpick because you may get an infection or bleed more easily. If you have any dental work done, tell your dentist you are receiving this medication. Avoid taking medications that contain aspirin, acetaminophen, ibuprofen, naproxen, or ketoprofen unless instructed by your care team. These medications may hide a fever. Do not treat diarrhea with over the counter products. Contact your care team if you have  diarrhea that lasts more than 2 days or if it is severe and watery. This medication can make you more sensitive to the sun. Keep out of the sun. If you cannot avoid being in the sun, wear protective clothing and sunscreen. Do not use sun lamps, tanning beds, or tanning booths. Talk to your care team if you or your partner wish to become pregnant or think you might be pregnant. This medication can cause serious birth defects if taken during pregnancy and for 3 months after the last dose. A reliable form of contraception is recommended while taking this medication and for 3 months after the last dose. Talk to your care team about effective forms of contraception. Do not father a child while taking this medication and for 3 months after the last dose. Use a condom while having sex during this time period. Do not breastfeed while taking this medication. This medication may cause infertility. Talk to your care team if you are concerned about your fertility. What side effects may I notice from  receiving this medication? Side effects that you should report to your care team as soon as possible: Allergic reactions--skin rash, itching, hives, swelling of the face, lips, tongue, or throat Heart attack--pain or tightness in the chest, shoulders, arms, or jaw, nausea, shortness of breath, cold or clammy skin, feeling faint or lightheaded Heart failure--shortness of breath, swelling of the ankles, feet, or hands, sudden weight gain, unusual weakness or fatigue Heart rhythm changes--fast or irregular heartbeat, dizziness, feeling faint or lightheaded, chest pain, trouble breathing High ammonia level--unusual weakness or fatigue, confusion, loss of appetite, nausea, vomiting, seizures Infection--fever, chills, cough, sore throat, wounds that don't heal, pain or trouble when passing urine, general feeling of discomfort or being unwell Low red blood cell level--unusual weakness or fatigue, dizziness, headache, trouble breathing Pain, tingling, or numbness in the hands or feet, muscle weakness, change in vision, confusion or trouble speaking, loss of balance or coordination, trouble walking, seizures Redness, swelling, and blistering of the skin over hands and feet Severe or prolonged diarrhea Unusual bruising or bleeding Side effects that usually do not require medical attention (report to your care team if they continue or are bothersome): Dry skin Headache Increased tears Nausea Pain, redness, or swelling with sores inside the mouth or throat Sensitivity to light Vomiting This list may not describe all possible side effects. Call your doctor for medical advice about side effects. You may report side effects to FDA at 1-800-FDA-1088. Where should I keep my medication? This medication is given in a hospital or clinic. It will not be stored at home. NOTE: This sheet is a summary. It may not cover all possible information. If you have questions about this medicine, talk to your doctor,  pharmacist, or health care provider.  2023 Elsevier/Gold Standard (2021-10-09 00:00:00)

## 2022-03-27 ENCOUNTER — Telehealth: Payer: Self-pay | Admitting: Pharmacist

## 2022-03-27 ENCOUNTER — Other Ambulatory Visit (HOSPITAL_COMMUNITY): Payer: Self-pay

## 2022-03-27 ENCOUNTER — Encounter: Payer: Self-pay | Admitting: Oncology

## 2022-03-27 ENCOUNTER — Telehealth: Payer: Self-pay

## 2022-03-27 DIAGNOSIS — C20 Malignant neoplasm of rectum: Secondary | ICD-10-CM

## 2022-03-27 MED ORDER — CAPECITABINE 500 MG PO TABS: 2000.0000 mg | ORAL_TABLET | Freq: Two times a day (BID) | ORAL | 0 refills | Status: DC

## 2022-03-27 MED ORDER — CAPECITABINE 500 MG PO TABS
ORAL_TABLET | ORAL | 0 refills | Status: DC
  Filled 2022-03-27: qty 224, fill #0

## 2022-03-27 NOTE — Telephone Encounter (Signed)
Oral Oncology Pharmacist Encounter  Received new prescription for Xeloda (capecitabine) for the treatment of adenocarcinoma of the proximal rectum/distal sigmoid in conjunction with radiation, planned duration until the end of radiation. Planned start 04/22/22.  CMP from 03/26/22 assessed, no relevant lab abnormalities. Prescription dose and frequency assessed.   Current medication list in Epic reviewed, no relevant DDIs with capecitabine identified.  Evaluated chart and no patient barriers to medication adherence identified.   Prescription has been e-scribed to the [facility] for benefits analysis and approval.  Oral Oncology Clinic will continue to follow for insurance authorization, copayment issues, initial counseling and start date.  Patient agreed to treatment on 04/26/22 per MD documentation.  Darl Pikes, PharmD, BCPS, BCOP, CPP Hematology/Oncology Clinical Pharmacist Practitioner [place]/DB/AP Oral [facility] [phone]  03/27/2022 10:22 AM

## 2022-03-27 NOTE — Telephone Encounter (Signed)
Oral Oncology Patient Advocate Encounter   Received notification that prior authorization for Capecitabine is required.   PA submitted on 10.11.23  Key [id number]  Status is pending     Billy Roman, [name]  [phone] (phone) [phone] (fax) 03/27/2022 10:20 AM

## 2022-03-27 NOTE — Telephone Encounter (Signed)
Oral Oncology Patient Advocate Encounter  Prior Authorization for Capecitabine has been approved.    PA# [id number]  Effective dates: 10.11.23 through 10.11.24  Patient must fill through [facility].    Berdine Addison, [facility] Patient [place]  [phone] (phone) [phone] (fax) 03/27/2022 12:00 PM

## 2022-03-28 ENCOUNTER — Ambulatory Visit: Payer: BC Managed Care – PPO | Admitting: Neurology

## 2022-03-28 ENCOUNTER — Inpatient Hospital Stay: Payer: BC Managed Care – PPO

## 2022-03-28 VITALS — BP 121/82 | HR 79 | Temp 98.1°F | Resp 18

## 2022-03-28 DIAGNOSIS — T451X5D Adverse effect of antineoplastic and immunosuppressive drugs, subsequent encounter: Secondary | ICD-10-CM | POA: Diagnosis not present

## 2022-03-28 DIAGNOSIS — Z452 Encounter for adjustment and management of vascular access device: Secondary | ICD-10-CM | POA: Diagnosis not present

## 2022-03-28 DIAGNOSIS — Z720 Tobacco use: Secondary | ICD-10-CM | POA: Diagnosis not present

## 2022-03-28 DIAGNOSIS — C20 Malignant neoplasm of rectum: Secondary | ICD-10-CM | POA: Diagnosis not present

## 2022-03-28 DIAGNOSIS — R970 Elevated carcinoembryonic antigen [CEA]: Secondary | ICD-10-CM | POA: Diagnosis not present

## 2022-03-28 DIAGNOSIS — D61810 Antineoplastic chemotherapy induced pancytopenia: Secondary | ICD-10-CM | POA: Diagnosis not present

## 2022-03-28 DIAGNOSIS — G620 Drug-induced polyneuropathy: Secondary | ICD-10-CM | POA: Diagnosis not present

## 2022-03-28 DIAGNOSIS — Z5111 Encounter for antineoplastic chemotherapy: Secondary | ICD-10-CM | POA: Diagnosis not present

## 2022-03-28 MED ORDER — HEPARIN SOD (PORK) LOCK FLUSH 100 UNIT/ML IV SOLN
500.0000 [IU] | Freq: Once | INTRAVENOUS | Status: AC | PRN
  Administered 2022-03-28: 500 [IU]

## 2022-03-28 MED ORDER — SODIUM CHLORIDE 0.9% FLUSH
10.0000 mL | INTRAVENOUS | Status: DC | PRN
  Administered 2022-03-28: 10 mL

## 2022-03-28 NOTE — Progress Notes (Signed)
[facility] CSW Progress Note  Clinical Education officer, museum contacted patient by phone to assess needs.  Billy Roman stated he was receiving long term disability from his work, but now must pay the full price of his health insurance through COB[employer].  Explored patient options.  He agreed to apply for disability through the [facility].  CSW mailed the release form to patient with a self-addressed stamped envelope with instructions.  Also provided patient with the website for Marketplace healthcare which will have open enrollment beginning November 1st.  He stated he understood.    Rodman Pickle Talisa Petrak, LCSW

## 2022-03-28 NOTE — Patient Instructions (Signed)

## 2022-04-03 NOTE — Telephone Encounter (Signed)
Oral Chemotherapy Pharmacist Encounter  [facility] will be delivering medication to patient on 04/09/22. He knows not to start until his first day of radiation.   Patient Education I spoke with patient for overview of new oral chemotherapy medication: Xeloda (capecitabine) for the treatment of adenocarcinoma of the proximal rectum/distal sigmoid in conjunction with radiation, planned duration until the end of radiation. Planned start 04/22/22.   Counseled patient on administration, dosing, side effects, monitoring, drug-food interactions, safe handling, storage, and disposal. Patient will take 4 tablets (2,000 mg total) by mouth 2 (two) times daily after a meal. Take Monday-Friday. Take only on days of radiation.  Side effects include but not limited to: diarrhea, hand-foot syndrome, mouth sores, edema, decreased wbc, fatigue, N/V Diarrhea: Recommended patient pick up loperamide to have on hand, he knows to call if he is having 4 or more loose stools per day.  Hand-foot syndrome: Recommended the use of Udderly Smooth Extra Care 20 Mouth sores: Patient knows to request magic mouthwash if needed  Reviewed with patient importance of keeping a medication schedule and plan for any missed doses.  After discussion with patient no patient barriers to medication adherence identified.   Mr. Gargis voiced understanding and appreciation. All questions answered. Medication handout provided.  Provided patient with [facility] phone number. Patient knows to call the office with questions or concerns. Oral Chemotherapy Navigation Clinic will continue to follow.  Darl Pikes, PharmD, BCPS, BCOP, CPP Hematology/Oncology Clinical Pharmacist Practitioner [place]/DB/AP Oral [facility] [phone]  04/03/2022 4:02 PM

## 2022-04-03 NOTE — Telephone Encounter (Signed)
[facility] to deliver on 04/09/22, copay $0

## 2022-04-10 ENCOUNTER — Telehealth: Payer: Self-pay

## 2022-04-10 NOTE — Telephone Encounter (Signed)
Patient called in and stated he is very fatigue. He's having concern about getting up and going to his appointments.  I advice the patient we help with transportation. I will reach out to [facility] to have him set up.

## 2022-04-11 ENCOUNTER — Telehealth: Payer: Self-pay | Admitting: Neurology

## 2022-04-11 DIAGNOSIS — C20 Malignant neoplasm of rectum: Secondary | ICD-10-CM | POA: Diagnosis not present

## 2022-04-11 NOTE — Telephone Encounter (Signed)
Art message regarding form

## 2022-04-12 ENCOUNTER — Other Ambulatory Visit: Payer: Self-pay

## 2022-04-12 ENCOUNTER — Inpatient Hospital Stay: Payer: BC Managed Care – PPO

## 2022-04-12 ENCOUNTER — Ambulatory Visit
Admission: RE | Admit: 2022-04-12 | Discharge: 2022-04-12 | Disposition: A | Discharge status: Home / Self Care | Payer: BC Managed Care – PPO | Source: Ambulatory Visit | Attending: Radiation Oncology | Admitting: Radiation Oncology

## 2022-04-12 DIAGNOSIS — C20 Malignant neoplasm of rectum: Secondary | ICD-10-CM | POA: Diagnosis not present

## 2022-04-19 ENCOUNTER — Inpatient Hospital Stay: Payer: BC Managed Care – PPO

## 2022-04-19 ENCOUNTER — Inpatient Hospital Stay: Payer: BC Managed Care – PPO | Admitting: Nurse Practitioner

## 2022-04-19 DIAGNOSIS — C20 Malignant neoplasm of rectum: Secondary | ICD-10-CM | POA: Diagnosis not present

## 2022-04-22 ENCOUNTER — Other Ambulatory Visit: Payer: Self-pay

## 2022-04-22 ENCOUNTER — Ambulatory Visit
Admission: RE | Admit: 2022-04-22 | Discharge: 2022-04-22 | Disposition: A | Discharge status: Home / Self Care | Payer: BC Managed Care – PPO | Source: Ambulatory Visit | Attending: Radiation Oncology | Admitting: Radiation Oncology

## 2022-04-22 ENCOUNTER — Telehealth: Payer: Self-pay | Admitting: *Deleted

## 2022-04-22 ENCOUNTER — Other Ambulatory Visit: Payer: Self-pay | Admitting: Oncology

## 2022-04-22 DIAGNOSIS — C20 Malignant neoplasm of rectum: Secondary | ICD-10-CM

## 2022-04-22 DIAGNOSIS — Z510 Encounter for antineoplastic radiation therapy: Secondary | ICD-10-CM | POA: Diagnosis not present

## 2022-04-22 LAB — RAD ONC ARIA SESSION SUMMARY
Course Elapsed Days: 0
Plan Fractions Treated to Date: 1
Plan Prescribed Dose Per Fraction: 1.8 Gy
Plan Total Fractions Prescribed: 25
Plan Total Prescribed Dose: 45 Gy
Reference Point Dosage Given to Date: 1.8 Gy
Reference Point Session Dosage Given: 1.8 Gy
Session Number: 1

## 2022-04-22 NOTE — Telephone Encounter (Signed)
Received refill request for capecitabine to take with radiation therapy. Confirmed w/Billy Roman that he already has the medication and will begin today. Reviewed his anti-emetic regimen. Reports significant fatigue--will discuss w/MD at his next visit on 11/08.

## 2022-04-23 ENCOUNTER — Other Ambulatory Visit: Payer: Self-pay

## 2022-04-23 ENCOUNTER — Ambulatory Visit
Admission: RE | Admit: 2022-04-23 | Discharge: 2022-04-23 | Disposition: A | Discharge status: Home / Self Care | Payer: BC Managed Care – PPO | Source: Ambulatory Visit | Attending: Radiation Oncology | Admitting: Radiation Oncology

## 2022-04-23 DIAGNOSIS — C20 Malignant neoplasm of rectum: Secondary | ICD-10-CM | POA: Diagnosis not present

## 2022-04-23 DIAGNOSIS — Z510 Encounter for antineoplastic radiation therapy: Secondary | ICD-10-CM | POA: Diagnosis not present

## 2022-04-23 LAB — RAD ONC ARIA SESSION SUMMARY
Course Elapsed Days: 1
Plan Fractions Treated to Date: 2
Plan Prescribed Dose Per Fraction: 1.8 Gy
Plan Total Fractions Prescribed: 25
Plan Total Prescribed Dose: 45 Gy
Reference Point Dosage Given to Date: 3.6 Gy
Reference Point Session Dosage Given: 1.8 Gy
Session Number: 2

## 2022-04-24 ENCOUNTER — Inpatient Hospital Stay: Payer: BC Managed Care – PPO

## 2022-04-24 ENCOUNTER — Inpatient Hospital Stay (HOSPITAL_BASED_OUTPATIENT_CLINIC_OR_DEPARTMENT_OTHER): Payer: BC Managed Care – PPO | Admitting: Nurse Practitioner

## 2022-04-24 ENCOUNTER — Encounter: Payer: Self-pay | Admitting: Nurse Practitioner

## 2022-04-24 ENCOUNTER — Ambulatory Visit
Admission: RE | Admit: 2022-04-24 | Discharge: 2022-04-24 | Disposition: A | Discharge status: Home / Self Care | Payer: BC Managed Care – PPO | Source: Ambulatory Visit | Attending: Radiation Oncology | Admitting: Radiation Oncology

## 2022-04-24 ENCOUNTER — Other Ambulatory Visit: Payer: Self-pay

## 2022-04-24 VITALS — BP 137/90 | HR 86 | Temp 98.1°F | Resp 18 | Ht 73.0 in | Wt 272.4 lb

## 2022-04-24 DIAGNOSIS — C20 Malignant neoplasm of rectum: Secondary | ICD-10-CM

## 2022-04-24 DIAGNOSIS — T451X5D Adverse effect of antineoplastic and immunosuppressive drugs, subsequent encounter: Secondary | ICD-10-CM | POA: Insufficient documentation

## 2022-04-24 DIAGNOSIS — H532 Diplopia: Secondary | ICD-10-CM | POA: Insufficient documentation

## 2022-04-24 DIAGNOSIS — G620 Drug-induced polyneuropathy: Secondary | ICD-10-CM | POA: Insufficient documentation

## 2022-04-24 DIAGNOSIS — Z23 Encounter for immunization: Secondary | ICD-10-CM | POA: Insufficient documentation

## 2022-04-24 DIAGNOSIS — F329 Major depressive disorder, single episode, unspecified: Secondary | ICD-10-CM | POA: Insufficient documentation

## 2022-04-24 DIAGNOSIS — Z923 Personal history of irradiation: Secondary | ICD-10-CM | POA: Insufficient documentation

## 2022-04-24 DIAGNOSIS — R5383 Other fatigue: Secondary | ICD-10-CM | POA: Insufficient documentation

## 2022-04-24 DIAGNOSIS — R970 Elevated carcinoembryonic antigen [CEA]: Secondary | ICD-10-CM | POA: Insufficient documentation

## 2022-04-24 DIAGNOSIS — Z510 Encounter for antineoplastic radiation therapy: Secondary | ICD-10-CM | POA: Diagnosis not present

## 2022-04-24 DIAGNOSIS — R197 Diarrhea, unspecified: Secondary | ICD-10-CM | POA: Insufficient documentation

## 2022-04-24 LAB — CBC WITH DIFFERENTIAL (CANCER CENTER ONLY)
Abs Immature Granulocytes: 0.02 10*3/uL (ref 0.00–0.07)
Basophils Absolute: 0.1 10*3/uL (ref 0.0–0.1)
Basophils Relative: 1 %
Eosinophils Absolute: 0.2 10*3/uL (ref 0.0–0.5)
Eosinophils Relative: 3 %
HCT: 48.5 % (ref 39.0–52.0)
Hemoglobin: 16.2 g/dL (ref 13.0–17.0)
Immature Granulocytes: 0 %
Lymphocytes Relative: 31 %
Lymphs Abs: 1.7 10*3/uL (ref 0.7–4.0)
MCH: 31.9 pg (ref 26.0–34.0)
MCHC: 33.4 g/dL (ref 30.0–36.0)
MCV: 95.5 fL (ref 80.0–100.0)
Monocytes Absolute: 0.6 10*3/uL (ref 0.1–1.0)
Monocytes Relative: 11 %
Neutro Abs: 3 10*3/uL (ref 1.7–7.7)
Neutrophils Relative %: 54 %
Platelet Count: 137 10*3/uL — ABNORMAL LOW (ref 150–400)
RBC: 5.08 MIL/uL (ref 4.22–5.81)
RDW: 15.1 % (ref 11.5–15.5)
WBC Count: 5.6 10*3/uL (ref 4.0–10.5)
nRBC: 0 % (ref 0.0–0.2)

## 2022-04-24 LAB — RAD ONC ARIA SESSION SUMMARY
Course Elapsed Days: 2
Plan Fractions Treated to Date: 3
Plan Prescribed Dose Per Fraction: 1.8 Gy
Plan Total Fractions Prescribed: 25
Plan Total Prescribed Dose: 45 Gy
Reference Point Dosage Given to Date: 5.4 Gy
Reference Point Session Dosage Given: 1.8 Gy
Session Number: 3

## 2022-04-24 LAB — CMP (CANCER CENTER ONLY)
ALT: 72 U/L — ABNORMAL HIGH (ref 0–44)
AST: 44 U/L — ABNORMAL HIGH (ref 15–41)
Albumin: 4.4 g/dL (ref 3.5–5.0)
Alkaline Phosphatase: 58 U/L (ref 38–126)
Anion gap: 10 (ref 5–15)
BUN: 16 mg/dL (ref 6–20)
CO2: 25 mmol/L (ref 22–32)
Calcium: 9.8 mg/dL (ref 8.9–10.3)
Chloride: 104 mmol/L (ref 98–111)
Creatinine: 0.75 mg/dL (ref 0.61–1.24)
GFR, Estimated: >60 mL/min (ref >60)
Glucose, Bld: 85 mg/dL (ref 70–99)
Potassium: 4.1 mmol/L (ref 3.5–5.1)
Sodium: 139 mmol/L (ref 135–145)
Total Bilirubin: 0.7 mg/dL (ref 0.3–1.2)
Total Protein: 7.5 g/dL (ref 6.5–8.1)

## 2022-04-24 MED ORDER — SODIUM CHLORIDE 0.9% FLUSH
10.0000 mL | Freq: Once | INTRAVENOUS | Status: AC
  Administered 2022-04-24: 10 mL via INTRAVENOUS

## 2022-04-24 MED ORDER — HEPARIN SOD (PORK) LOCK FLUSH 100 UNIT/ML IV SOLN
500.0000 [IU] | Freq: Once | INTRAVENOUS | Status: AC
  Administered 2022-04-24: 500 [IU] via INTRAVENOUS

## 2022-04-24 NOTE — Progress Notes (Signed)
  [facility] OFFICE PROGRESS NOTE   Diagnosis: Rectal cancer  INTERVAL HISTORY:   Billy Roman returns as scheduled.  He began radiation/Xeloda 04/22/2022.  He has mild intermittent nausea.  No mouth sores.  No diarrhea.  No hand or foot pain or redness.  Main complaint is a lack of energy.  Objective:  Vital signs in last 24 hours:  Blood pressure (!) 137/90, pulse 86, temperature 98.1 F (36.7 C), temperature source Oral, resp. rate 18, height '6\' 1"'$  (1.854 m), weight 272 lb 6.4 oz (123.6 kg), SpO2 98 %.    HEENT: No thrush or ulcers. Resp: Lungs clear bilaterally. Cardio: Regular rate and rhythm. GI: Abdomen soft and nontender.  No hepatosplenomegaly. Vascular: No leg edema. Skin: Palms without erythema. Port-A-Cath without erythema.   Lab Results:  Lab Results  Component Value Date   WBC 5.6 04/24/2022   HGB 16.2 04/24/2022   HCT 48.5 04/24/2022   MCV 95.5 04/24/2022   PLT 137 (L) 04/24/2022   NEUTROABS 3.0 04/24/2022    Imaging:  No results found.  Medications: I have reviewed the patient's current medications.  Assessment/Plan: Adenocarcinoma of the proximal rectum/distal sigmoid Colonoscopy 11/19/2021-partially obstructing mass at 12-19 cm, biopsy moderate to poorly differentiated adenocarcinoma Mildly elevated CEA CTs 11/29/2021-mural thickening in the distal sigmoid/proximal rectum with haziness of the mesorectal fat, mesorectal lymphadenopathy, small pulmonary nodules favored to be benign MRI pelvis 12/03/2021-tumor at 10 cm from the anal verge, T3c, approximately 10 metastatic nodes in the mesorectal sheath and presacral space, no extra mesorectal lymphadenopathy, N2 Cycle 1 FOLFOX 12/19/2021 Cycle 2 FOLFOX 01/02/2022, home Decadron prophylaxis added for delayed nausea Cycle 3 FOLFOX 01/15/2022 Cycle 4 FOLFOX 01/29/2022, 5-FU bolus, leucovorin, and oxaliplatin dose reduced secondary to neutropenia and thrombocytopenia Cycle 5 FOLFOX 02/12/2022,  Udenyca Cycle 6 FOLFOX 02/26/2022, Udenyca Cycle 7 FOLFOX 03/12/2022, Udenyca  Cycle 8 FOLFOX 03/26/2022, oxaliplatin held due to neuropathy, Udenyca held Radiation/Xeloda 04/22/2022 2.   Major depressive disorder Change in bowel habits and rectal bleeding secondary to #1 Ongoing tobacco use Report of blurred vision and diplopia Peripheral neuropathy-fingers and left foot  Disposition: Billy Roman appears stable.  He began the planned course of radiation/Xeloda 04/22/2022.  Thus far he seems to be tolerating well.  Plan to continue the same.  CBC reviewed.  Counts adequate to continue with Xeloda as above.  We will see him in follow-up in 2 weeks.  He will contact the office in the interim with any problems.    Billy Roman ANP/GNP-BC   04/24/2022  3:05 PM

## 2022-04-25 ENCOUNTER — Ambulatory Visit
Admission: RE | Admit: 2022-04-25 | Discharge: 2022-04-25 | Disposition: A | Discharge status: Home / Self Care | Payer: BC Managed Care – PPO | Source: Ambulatory Visit | Attending: Radiation Oncology | Admitting: Radiation Oncology

## 2022-04-25 ENCOUNTER — Other Ambulatory Visit: Payer: Self-pay

## 2022-04-25 DIAGNOSIS — C20 Malignant neoplasm of rectum: Secondary | ICD-10-CM | POA: Diagnosis not present

## 2022-04-25 DIAGNOSIS — Z510 Encounter for antineoplastic radiation therapy: Secondary | ICD-10-CM | POA: Diagnosis not present

## 2022-04-25 LAB — RAD ONC ARIA SESSION SUMMARY
Course Elapsed Days: 3
Plan Fractions Treated to Date: 4
Plan Prescribed Dose Per Fraction: 1.8 Gy
Plan Total Fractions Prescribed: 25
Plan Total Prescribed Dose: 45 Gy
Reference Point Dosage Given to Date: 7.2 Gy
Reference Point Session Dosage Given: 1.8 Gy
Session Number: 4

## 2022-04-26 ENCOUNTER — Inpatient Hospital Stay: Payer: BC Managed Care – PPO

## 2022-04-26 ENCOUNTER — Ambulatory Visit
Admission: RE | Admit: 2022-04-26 | Discharge: 2022-04-26 | Disposition: A | Discharge status: Home / Self Care | Payer: BC Managed Care – PPO | Source: Ambulatory Visit | Attending: Radiation Oncology | Admitting: Radiation Oncology

## 2022-04-26 ENCOUNTER — Other Ambulatory Visit: Payer: Self-pay

## 2022-04-26 DIAGNOSIS — C20 Malignant neoplasm of rectum: Secondary | ICD-10-CM

## 2022-04-26 DIAGNOSIS — Z510 Encounter for antineoplastic radiation therapy: Secondary | ICD-10-CM | POA: Diagnosis not present

## 2022-04-26 LAB — RAD ONC ARIA SESSION SUMMARY
Course Elapsed Days: 4
Plan Fractions Treated to Date: 5
Plan Prescribed Dose Per Fraction: 1.8 Gy
Plan Total Fractions Prescribed: 25
Plan Total Prescribed Dose: 45 Gy
Reference Point Dosage Given to Date: 9 Gy
Reference Point Session Dosage Given: 1.8 Gy
Session Number: 5

## 2022-04-26 MED ORDER — SONAFINE EX EMUL
1.0000 | Freq: Once | CUTANEOUS | Status: AC
  Administered 2022-04-26: 1 via TOPICAL

## 2022-04-26 NOTE — Progress Notes (Signed)
[place] CSW Progress Note  Clinical Social Worker contacted patient by phone to discuss his disability status.  Patient reports he is working through his employer to obtain disability.  I informed him that he could only apply one time and to discard the release of information CSW sent him for the [facility]. 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  He expressed no other needs at this time.    Billy Pickle [name], LCSW

## 2022-04-29 ENCOUNTER — Other Ambulatory Visit: Payer: Self-pay

## 2022-04-29 ENCOUNTER — Ambulatory Visit
Admission: RE | Admit: 2022-04-29 | Discharge: 2022-04-29 | Disposition: A | Discharge status: Home / Self Care | Payer: BC Managed Care – PPO | Source: Ambulatory Visit | Attending: Radiation Oncology | Admitting: Radiation Oncology

## 2022-04-29 DIAGNOSIS — C20 Malignant neoplasm of rectum: Secondary | ICD-10-CM | POA: Diagnosis not present

## 2022-04-29 DIAGNOSIS — Z510 Encounter for antineoplastic radiation therapy: Secondary | ICD-10-CM | POA: Diagnosis not present

## 2022-04-29 LAB — RAD ONC ARIA SESSION SUMMARY
Course Elapsed Days: 7
Plan Fractions Treated to Date: 6
Plan Prescribed Dose Per Fraction: 1.8 Gy
Plan Total Fractions Prescribed: 25
Plan Total Prescribed Dose: 45 Gy
Reference Point Dosage Given to Date: 10.8 Gy
Reference Point Session Dosage Given: 1.8 Gy
Session Number: 6

## 2022-04-30 ENCOUNTER — Other Ambulatory Visit: Payer: Self-pay

## 2022-04-30 ENCOUNTER — Ambulatory Visit
Admission: RE | Admit: 2022-04-30 | Discharge: 2022-04-30 | Disposition: A | Discharge status: Home / Self Care | Payer: BC Managed Care – PPO | Source: Ambulatory Visit | Attending: Radiation Oncology | Admitting: Radiation Oncology

## 2022-04-30 DIAGNOSIS — C20 Malignant neoplasm of rectum: Secondary | ICD-10-CM | POA: Diagnosis not present

## 2022-04-30 DIAGNOSIS — Z510 Encounter for antineoplastic radiation therapy: Secondary | ICD-10-CM | POA: Diagnosis not present

## 2022-04-30 LAB — RAD ONC ARIA SESSION SUMMARY
Course Elapsed Days: 8
Plan Fractions Treated to Date: 7
Plan Prescribed Dose Per Fraction: 1.8 Gy
Plan Total Fractions Prescribed: 25
Plan Total Prescribed Dose: 45 Gy
Reference Point Dosage Given to Date: 12.6 Gy
Reference Point Session Dosage Given: 1.8 Gy
Session Number: 7

## 2022-05-01 ENCOUNTER — Other Ambulatory Visit: Payer: Self-pay

## 2022-05-01 ENCOUNTER — Ambulatory Visit
Admission: RE | Admit: 2022-05-01 | Discharge: 2022-05-01 | Disposition: A | Discharge status: Home / Self Care | Payer: BC Managed Care – PPO | Source: Ambulatory Visit | Attending: Radiation Oncology | Admitting: Radiation Oncology

## 2022-05-01 DIAGNOSIS — C20 Malignant neoplasm of rectum: Secondary | ICD-10-CM | POA: Diagnosis not present

## 2022-05-01 DIAGNOSIS — Z510 Encounter for antineoplastic radiation therapy: Secondary | ICD-10-CM | POA: Diagnosis not present

## 2022-05-01 LAB — RAD ONC ARIA SESSION SUMMARY
Course Elapsed Days: 9
Plan Fractions Treated to Date: 8
Plan Prescribed Dose Per Fraction: 1.8 Gy
Plan Total Fractions Prescribed: 25
Plan Total Prescribed Dose: 45 Gy
Reference Point Dosage Given to Date: 14.4 Gy
Reference Point Session Dosage Given: 1.8 Gy
Session Number: 8

## 2022-05-02 ENCOUNTER — Telehealth: Payer: Self-pay

## 2022-05-02 ENCOUNTER — Ambulatory Visit
Admission: RE | Admit: 2022-05-02 | Discharge: 2022-05-02 | Disposition: A | Discharge status: Home / Self Care | Payer: BC Managed Care – PPO | Source: Ambulatory Visit | Attending: Radiation Oncology | Admitting: Radiation Oncology

## 2022-05-02 ENCOUNTER — Other Ambulatory Visit: Payer: Self-pay

## 2022-05-02 DIAGNOSIS — C20 Malignant neoplasm of rectum: Secondary | ICD-10-CM | POA: Diagnosis not present

## 2022-05-02 DIAGNOSIS — Z510 Encounter for antineoplastic radiation therapy: Secondary | ICD-10-CM | POA: Diagnosis not present

## 2022-05-02 LAB — RAD ONC ARIA SESSION SUMMARY
Course Elapsed Days: 10
Plan Fractions Treated to Date: 9
Plan Prescribed Dose Per Fraction: 1.8 Gy
Plan Total Fractions Prescribed: 25
Plan Total Prescribed Dose: 45 Gy
Reference Point Dosage Given to Date: 16.2 Gy
Reference Point Session Dosage Given: 1.8 Gy
Session Number: 9

## 2022-05-02 NOTE — Telephone Encounter (Signed)
Rash by RT area, micronidazole but burns. Having burning with urination Groin area v3 x 2-3 in size, unsure if open, stings and burns when uses micrinonidazole powder spray, no drainage. No fever, no rash in other areas. Has washed with baby shampoo gently, also tried  micronidazole cream.  States this area is part of and also adjacent to the radiation field. He has not tried calling RT. I will contact them and have Dr Ida Rogue nurse call him. Contacted Education administrator. She will call him

## 2022-05-03 ENCOUNTER — Ambulatory Visit
Admission: RE | Admit: 2022-05-03 | Discharge: 2022-05-03 | Disposition: A | Discharge status: Home / Self Care | Payer: BC Managed Care – PPO | Source: Ambulatory Visit | Attending: Radiation Oncology | Admitting: Radiation Oncology

## 2022-05-03 ENCOUNTER — Other Ambulatory Visit: Payer: Self-pay

## 2022-05-03 DIAGNOSIS — C20 Malignant neoplasm of rectum: Secondary | ICD-10-CM | POA: Diagnosis not present

## 2022-05-03 DIAGNOSIS — Z510 Encounter for antineoplastic radiation therapy: Secondary | ICD-10-CM | POA: Diagnosis not present

## 2022-05-03 LAB — RAD ONC ARIA SESSION SUMMARY
Course Elapsed Days: 11
Plan Fractions Treated to Date: 10
Plan Prescribed Dose Per Fraction: 1.8 Gy
Plan Total Fractions Prescribed: 25
Plan Total Prescribed Dose: 45 Gy
Reference Point Dosage Given to Date: 18 Gy
Reference Point Session Dosage Given: 1.8 Gy
Session Number: 10

## 2022-05-05 ENCOUNTER — Other Ambulatory Visit: Payer: Self-pay

## 2022-05-05 ENCOUNTER — Ambulatory Visit
Admission: RE | Admit: 2022-05-05 | Discharge: 2022-05-05 | Disposition: A | Discharge status: Home / Self Care | Payer: BC Managed Care – PPO | Source: Ambulatory Visit | Attending: Radiation Oncology | Admitting: Radiation Oncology

## 2022-05-05 DIAGNOSIS — Z510 Encounter for antineoplastic radiation therapy: Secondary | ICD-10-CM | POA: Diagnosis not present

## 2022-05-05 DIAGNOSIS — C20 Malignant neoplasm of rectum: Secondary | ICD-10-CM | POA: Diagnosis not present

## 2022-05-05 LAB — RAD ONC ARIA SESSION SUMMARY
Course Elapsed Days: 13
Plan Fractions Treated to Date: 11
Plan Prescribed Dose Per Fraction: 1.8 Gy
Plan Total Fractions Prescribed: 25
Plan Total Prescribed Dose: 45 Gy
Reference Point Dosage Given to Date: 19.8 Gy
Reference Point Session Dosage Given: 1.8 Gy
Session Number: 11

## 2022-05-06 ENCOUNTER — Other Ambulatory Visit: Payer: Self-pay

## 2022-05-06 ENCOUNTER — Ambulatory Visit
Admission: RE | Admit: 2022-05-06 | Discharge: 2022-05-06 | Disposition: A | Discharge status: Home / Self Care | Payer: BC Managed Care – PPO | Source: Ambulatory Visit | Attending: Radiation Oncology | Admitting: Radiation Oncology

## 2022-05-06 DIAGNOSIS — Z510 Encounter for antineoplastic radiation therapy: Secondary | ICD-10-CM | POA: Diagnosis not present

## 2022-05-06 DIAGNOSIS — C20 Malignant neoplasm of rectum: Secondary | ICD-10-CM | POA: Diagnosis not present

## 2022-05-06 LAB — RAD ONC ARIA SESSION SUMMARY
Course Elapsed Days: 14
Plan Fractions Treated to Date: 12
Plan Prescribed Dose Per Fraction: 1.8 Gy
Plan Total Fractions Prescribed: 25
Plan Total Prescribed Dose: 45 Gy
Reference Point Dosage Given to Date: 21.6 Gy
Reference Point Session Dosage Given: 1.8 Gy
Session Number: 12

## 2022-05-07 ENCOUNTER — Ambulatory Visit
Admission: RE | Admit: 2022-05-07 | Discharge: 2022-05-07 | Disposition: A | Discharge status: Home / Self Care | Payer: BC Managed Care – PPO | Source: Ambulatory Visit | Attending: Radiation Oncology | Admitting: Radiation Oncology

## 2022-05-07 ENCOUNTER — Inpatient Hospital Stay: Payer: BC Managed Care – PPO

## 2022-05-07 ENCOUNTER — Inpatient Hospital Stay (HOSPITAL_BASED_OUTPATIENT_CLINIC_OR_DEPARTMENT_OTHER): Payer: BC Managed Care – PPO | Admitting: Oncology

## 2022-05-07 ENCOUNTER — Other Ambulatory Visit: Payer: Self-pay

## 2022-05-07 VITALS — BP 138/83 | HR 84 | Temp 98.2°F | Resp 18 | Ht 73.0 in | Wt 273.6 lb

## 2022-05-07 DIAGNOSIS — C20 Malignant neoplasm of rectum: Secondary | ICD-10-CM

## 2022-05-07 DIAGNOSIS — Z510 Encounter for antineoplastic radiation therapy: Secondary | ICD-10-CM | POA: Diagnosis not present

## 2022-05-07 DIAGNOSIS — Z23 Encounter for immunization: Secondary | ICD-10-CM

## 2022-05-07 LAB — RAD ONC ARIA SESSION SUMMARY
Course Elapsed Days: 15
Plan Fractions Treated to Date: 13
Plan Prescribed Dose Per Fraction: 1.8 Gy
Plan Total Fractions Prescribed: 25
Plan Total Prescribed Dose: 45 Gy
Reference Point Dosage Given to Date: 23.4 Gy
Reference Point Session Dosage Given: 1.8 Gy
Session Number: 13

## 2022-05-07 LAB — CBC WITH DIFFERENTIAL (CANCER CENTER ONLY)
Abs Immature Granulocytes: 0.01 10*3/uL (ref 0.00–0.07)
Basophils Absolute: 0 10*3/uL (ref 0.0–0.1)
Basophils Relative: 1 %
Eosinophils Absolute: 0.2 10*3/uL (ref 0.0–0.5)
Eosinophils Relative: 5 %
HCT: 46.1 % (ref 39.0–52.0)
Hemoglobin: 15.7 g/dL (ref 13.0–17.0)
Immature Granulocytes: 0 %
Lymphocytes Relative: 28 %
Lymphs Abs: 1 10*3/uL (ref 0.7–4.0)
MCH: 32.6 pg (ref 26.0–34.0)
MCHC: 34.1 g/dL (ref 30.0–36.0)
MCV: 95.8 fL (ref 80.0–100.0)
Monocytes Absolute: 0.4 10*3/uL (ref 0.1–1.0)
Monocytes Relative: 11 %
Neutro Abs: 1.9 10*3/uL (ref 1.7–7.7)
Neutrophils Relative %: 55 %
Platelet Count: 150 10*3/uL (ref 150–400)
RBC: 4.81 MIL/uL (ref 4.22–5.81)
RDW: 14.6 % (ref 11.5–15.5)
WBC Count: 3.6 10*3/uL — ABNORMAL LOW (ref 4.0–10.5)
nRBC: 0 % (ref 0.0–0.2)

## 2022-05-07 LAB — CMP (CANCER CENTER ONLY)
ALT: 47 U/L — ABNORMAL HIGH (ref 0–44)
AST: 36 U/L (ref 15–41)
Albumin: 4.4 g/dL (ref 3.5–5.0)
Alkaline Phosphatase: 56 U/L (ref 38–126)
Anion gap: 9 (ref 5–15)
BUN: 11 mg/dL (ref 6–20)
CO2: 26 mmol/L (ref 22–32)
Calcium: 9.5 mg/dL (ref 8.9–10.3)
Chloride: 104 mmol/L (ref 98–111)
Creatinine: 0.71 mg/dL (ref 0.61–1.24)
GFR, Estimated: >60 mL/min (ref >60)
Glucose, Bld: 97 mg/dL (ref 70–99)
Potassium: 3.8 mmol/L (ref 3.5–5.1)
Sodium: 139 mmol/L (ref 135–145)
Total Bilirubin: 0.5 mg/dL (ref 0.3–1.2)
Total Protein: 7.7 g/dL (ref 6.5–8.1)

## 2022-05-07 MED ORDER — INFLUENZA VAC SPLIT QUAD 0.5 ML IM SUSY
0.5000 mL | PREFILLED_SYRINGE | Freq: Once | INTRAMUSCULAR | Status: AC
  Administered 2022-05-07: 0.5 mL via INTRAMUSCULAR

## 2022-05-07 NOTE — Progress Notes (Signed)
[place] OFFICE PROGRESS NOTE   Diagnosis: Rectal cancer  INTERVAL HISTORY:   Billy Roman returns as scheduled.  He continues Xeloda and radiation.  He reports mild intermittent diarrhea.  Mild intermittent rectal bleeding.  No hand or foot pain.  He continues to have tingling in the hands and feet.  This has progressed following the completion of FOLFOX. He would like to have the Port-A-Cath removed before the end of the year  Objective:  Vital signs in last 24 hours:  Blood pressure 138/83, pulse 84, temperature 98.2 F (36.8 C), temperature source Oral, resp. rate 18, height '6\' 1"'$  (1.854 m), weight 273 lb 9.6 oz (124.1 kg), SpO2 98 %.    HEENT: No thrush or ulcers Resp: Lungs clear bilaterally Cardio: Regular rhythm, distant sounds GI: Nontender, no hepatosplenomegaly Vascular: No leg edema  Skin: Dryness of the hands, mild hyperpigmentation and dryness of the soles  Portacath/PICC-erythema and a tape/cleaning solution distribution  Lab Results:  Lab Results  Component Value Date   WBC 3.6 (L) 05/07/2022   HGB 15.7 05/07/2022   HCT 46.1 05/07/2022   MCV 95.8 05/07/2022   PLT 150 05/07/2022   NEUTROABS 1.9 05/07/2022    CMP  Lab Results  Component Value Date   NA 139 04/24/2022   K 4.1 04/24/2022   CL 104 04/24/2022   CO2 25 04/24/2022   GLUCOSE 85 04/24/2022   BUN 16 04/24/2022   CREATININE 0.75 04/24/2022   CALCIUM 9.8 04/24/2022   PROT 7.5 04/24/2022   ALBUMIN 4.4 04/24/2022   AST 44 (H) 04/24/2022   ALT 72 (H) 04/24/2022   ALKPHOS 58 04/24/2022   BILITOT 0.7 04/24/2022   GFRNONAA >60 04/24/2022   GFRAA 117 07/26/2019    Lab Results  Component Value Date   CEA 3.65 12/14/2021    Medications: I have reviewed the patient's current medications.   Assessment/Plan: Adenocarcinoma of the proximal rectum/distal sigmoid Colonoscopy 11/19/2021-partially obstructing mass at 12-19 cm, biopsy moderate to poorly differentiated  adenocarcinoma Mildly elevated CEA CTs 11/29/2021-mural thickening in the distal sigmoid/proximal rectum with haziness of the mesorectal fat, mesorectal lymphadenopathy, small pulmonary nodules favored to be benign MRI pelvis 12/03/2021-tumor at 10 cm from the anal verge, T3c, approximately 10 metastatic nodes in the mesorectal sheath and presacral space, no extra mesorectal lymphadenopathy, N2 Cycle 1 FOLFOX 12/19/2021 Cycle 2 FOLFOX 01/02/2022, home Decadron prophylaxis added for delayed nausea Cycle 3 FOLFOX 01/15/2022 Cycle 4 FOLFOX 01/29/2022, 5-FU bolus, leucovorin, and oxaliplatin dose reduced secondary to neutropenia and thrombocytopenia Cycle 5 FOLFOX 02/12/2022, Udenyca Cycle 6 FOLFOX 02/26/2022, Udenyca Cycle 7 FOLFOX 03/12/2022, Udenyca  Cycle 8 FOLFOX 03/26/2022, oxaliplatin held due to neuropathy, Udenyca held Radiation/Xeloda 04/22/2022 2.   Major depressive disorder Change in bowel habits and rectal bleeding secondary to #1 Ongoing tobacco use Report of blurred vision and diplopia Peripheral neuropathy-fingers and left foot    Disposition: Mr. Leiker continues neoadjuvant treatment for rectal cancer.  He will complete the course of Xeloda and radiation over the next few weeks.  He appears to be tolerating the current treatment well.  Hopefully neuropathy symptoms will improve over the next few months.  He would like to have the Port-A-Cath removed for insurance purposes.  We will make referral to Dr. Dema Severin.  Mr. Monnier will return for an office and lab visit in 2 weeks.  He received an influenza vaccine today.  I recommended he discontinue smoking.  He has borderline low neutropenia.  He will call for  a fever or symptoms of an infection.  [name], MD  05/07/2022  12:48 PM

## 2022-05-08 ENCOUNTER — Ambulatory Visit
Admission: RE | Admit: 2022-05-08 | Discharge: 2022-05-08 | Disposition: A | Discharge status: Home / Self Care | Payer: BC Managed Care – PPO | Source: Ambulatory Visit | Attending: Radiation Oncology | Admitting: Radiation Oncology

## 2022-05-08 ENCOUNTER — Other Ambulatory Visit: Payer: Self-pay

## 2022-05-08 ENCOUNTER — Inpatient Hospital Stay: Payer: BC Managed Care – PPO

## 2022-05-08 DIAGNOSIS — Z510 Encounter for antineoplastic radiation therapy: Secondary | ICD-10-CM | POA: Diagnosis not present

## 2022-05-08 DIAGNOSIS — C20 Malignant neoplasm of rectum: Secondary | ICD-10-CM | POA: Diagnosis not present

## 2022-05-08 LAB — RAD ONC ARIA SESSION SUMMARY
Course Elapsed Days: 16
Plan Fractions Treated to Date: 14
Plan Prescribed Dose Per Fraction: 1.8 Gy
Plan Total Fractions Prescribed: 25
Plan Total Prescribed Dose: 45 Gy
Reference Point Dosage Given to Date: 25.2 Gy
Reference Point Session Dosage Given: 1.8 Gy
Session Number: 14

## 2022-05-13 ENCOUNTER — Inpatient Hospital Stay: Payer: BC Managed Care – PPO

## 2022-05-13 ENCOUNTER — Other Ambulatory Visit: Payer: Self-pay

## 2022-05-13 ENCOUNTER — Ambulatory Visit
Admission: RE | Admit: 2022-05-13 | Discharge: 2022-05-13 | Disposition: A | Discharge status: Home / Self Care | Payer: BC Managed Care – PPO | Source: Ambulatory Visit | Attending: Radiation Oncology | Admitting: Radiation Oncology

## 2022-05-13 DIAGNOSIS — C20 Malignant neoplasm of rectum: Secondary | ICD-10-CM | POA: Diagnosis not present

## 2022-05-13 DIAGNOSIS — Z510 Encounter for antineoplastic radiation therapy: Secondary | ICD-10-CM | POA: Diagnosis not present

## 2022-05-13 LAB — RAD ONC ARIA SESSION SUMMARY
Course Elapsed Days: 21
Plan Fractions Treated to Date: 15
Plan Prescribed Dose Per Fraction: 1.8 Gy
Plan Total Fractions Prescribed: 25
Plan Total Prescribed Dose: 45 Gy
Reference Point Dosage Given to Date: 27 Gy
Reference Point Session Dosage Given: 1.8 Gy
Session Number: 15

## 2022-05-14 ENCOUNTER — Other Ambulatory Visit: Payer: Self-pay

## 2022-05-14 ENCOUNTER — Encounter (HOSPITAL_BASED_OUTPATIENT_CLINIC_OR_DEPARTMENT_OTHER): Payer: Self-pay | Admitting: Surgery

## 2022-05-14 ENCOUNTER — Inpatient Hospital Stay: Payer: BC Managed Care – PPO

## 2022-05-14 ENCOUNTER — Ambulatory Visit
Admission: RE | Admit: 2022-05-14 | Discharge: 2022-05-14 | Disposition: A | Discharge status: Home / Self Care | Payer: BC Managed Care – PPO | Source: Ambulatory Visit | Attending: Radiation Oncology | Admitting: Radiation Oncology

## 2022-05-14 DIAGNOSIS — Z510 Encounter for antineoplastic radiation therapy: Secondary | ICD-10-CM | POA: Diagnosis not present

## 2022-05-14 DIAGNOSIS — C20 Malignant neoplasm of rectum: Secondary | ICD-10-CM | POA: Diagnosis not present

## 2022-05-14 LAB — RAD ONC ARIA SESSION SUMMARY
Course Elapsed Days: 22
Plan Fractions Treated to Date: 16
Plan Prescribed Dose Per Fraction: 1.8 Gy
Plan Total Fractions Prescribed: 25
Plan Total Prescribed Dose: 45 Gy
Reference Point Dosage Given to Date: 28.8 Gy
Reference Point Session Dosage Given: 1.8 Gy
Session Number: 16

## 2022-05-14 NOTE — Progress Notes (Signed)
Spoke w/ via phone for pre-op interview--- [facility] Lab needs dos----  NONE             Lab results------Current EKG in Epic dated 09/2021 COVID test -----patient states asymptomatic no test needed Arrive at -------0900 NPO after MN NO Solid Food.  Clear liquids from MN until---0800 Med rec completed Medications to take morning of surgery -----NONE Diabetic medication ----- Patient instructed no nail polish to be worn day of surgery Patient instructed to bring photo id and insurance card day of surgery Patient aware to have Driver (ride ) / caregiver Unsure of driver, either daughter or friend   for 24 hours after surgery  Patient Special Instructions ----- Pre-Op special Istructions ----- Patient verbalized understanding of instructions that were given at this phone interview. Patient denies shortness of breath, chest pain, fever, cough at this phone interview.

## 2022-05-15 ENCOUNTER — Inpatient Hospital Stay: Payer: BC Managed Care – PPO

## 2022-05-15 ENCOUNTER — Other Ambulatory Visit: Payer: Self-pay

## 2022-05-15 ENCOUNTER — Ambulatory Visit
Admission: RE | Admit: 2022-05-15 | Discharge: 2022-05-15 | Disposition: A | Discharge status: Home / Self Care | Payer: BC Managed Care – PPO | Source: Ambulatory Visit | Attending: Radiation Oncology | Admitting: Radiation Oncology

## 2022-05-15 DIAGNOSIS — Z510 Encounter for antineoplastic radiation therapy: Secondary | ICD-10-CM | POA: Diagnosis not present

## 2022-05-15 DIAGNOSIS — C20 Malignant neoplasm of rectum: Secondary | ICD-10-CM | POA: Diagnosis not present

## 2022-05-15 LAB — RAD ONC ARIA SESSION SUMMARY
Course Elapsed Days: 23
Plan Fractions Treated to Date: 17
Plan Prescribed Dose Per Fraction: 1.8 Gy
Plan Total Fractions Prescribed: 25
Plan Total Prescribed Dose: 45 Gy
Reference Point Dosage Given to Date: 30.6 Gy
Reference Point Session Dosage Given: 1.8 Gy
Session Number: 17

## 2022-05-16 ENCOUNTER — Ambulatory Visit
Admission: RE | Admit: 2022-05-16 | Discharge: 2022-05-16 | Disposition: A | Discharge status: Home / Self Care | Payer: BC Managed Care – PPO | Source: Ambulatory Visit | Attending: Radiation Oncology | Admitting: Radiation Oncology

## 2022-05-16 ENCOUNTER — Other Ambulatory Visit: Payer: Self-pay

## 2022-05-16 ENCOUNTER — Inpatient Hospital Stay: Payer: BC Managed Care – PPO

## 2022-05-16 DIAGNOSIS — Z510 Encounter for antineoplastic radiation therapy: Secondary | ICD-10-CM | POA: Diagnosis not present

## 2022-05-16 DIAGNOSIS — C20 Malignant neoplasm of rectum: Secondary | ICD-10-CM | POA: Diagnosis not present

## 2022-05-16 LAB — RAD ONC ARIA SESSION SUMMARY
Course Elapsed Days: 24
Plan Fractions Treated to Date: 18
Plan Prescribed Dose Per Fraction: 1.8 Gy
Plan Total Fractions Prescribed: 25
Plan Total Prescribed Dose: 45 Gy
Reference Point Dosage Given to Date: 32.4 Gy
Reference Point Session Dosage Given: 1.8 Gy
Session Number: 18

## 2022-05-17 ENCOUNTER — Ambulatory Visit
Admission: RE | Admit: 2022-05-17 | Discharge: 2022-05-17 | Disposition: A | Discharge status: Home / Self Care | Payer: BC Managed Care – PPO | Source: Ambulatory Visit | Attending: Radiation Oncology | Admitting: Radiation Oncology

## 2022-05-17 ENCOUNTER — Ambulatory Visit: Payer: Self-pay | Admitting: Surgery

## 2022-05-17 ENCOUNTER — Inpatient Hospital Stay: Payer: BC Managed Care – PPO

## 2022-05-17 ENCOUNTER — Other Ambulatory Visit: Payer: Self-pay

## 2022-05-17 DIAGNOSIS — F17210 Nicotine dependence, cigarettes, uncomplicated: Secondary | ICD-10-CM | POA: Insufficient documentation

## 2022-05-17 DIAGNOSIS — G620 Drug-induced polyneuropathy: Secondary | ICD-10-CM | POA: Insufficient documentation

## 2022-05-17 DIAGNOSIS — R110 Nausea: Secondary | ICD-10-CM | POA: Insufficient documentation

## 2022-05-17 DIAGNOSIS — C20 Malignant neoplasm of rectum: Secondary | ICD-10-CM | POA: Insufficient documentation

## 2022-05-17 DIAGNOSIS — T451X5D Adverse effect of antineoplastic and immunosuppressive drugs, subsequent encounter: Secondary | ICD-10-CM | POA: Insufficient documentation

## 2022-05-17 DIAGNOSIS — R970 Elevated carcinoembryonic antigen [CEA]: Secondary | ICD-10-CM | POA: Insufficient documentation

## 2022-05-17 DIAGNOSIS — Z510 Encounter for antineoplastic radiation therapy: Secondary | ICD-10-CM | POA: Diagnosis not present

## 2022-05-17 LAB — RAD ONC ARIA SESSION SUMMARY
Course Elapsed Days: 25
Plan Fractions Treated to Date: 19
Plan Prescribed Dose Per Fraction: 1.8 Gy
Plan Total Fractions Prescribed: 25
Plan Total Prescribed Dose: 45 Gy
Reference Point Dosage Given to Date: 34.2 Gy
Reference Point Session Dosage Given: 1.8 Gy
Session Number: 19

## 2022-05-20 ENCOUNTER — Ambulatory Visit (HOSPITAL_BASED_OUTPATIENT_CLINIC_OR_DEPARTMENT_OTHER): Payer: BC Managed Care – PPO | Admitting: Anesthesiology

## 2022-05-20 ENCOUNTER — Other Ambulatory Visit (HOSPITAL_COMMUNITY): Payer: Self-pay

## 2022-05-20 ENCOUNTER — Encounter (HOSPITAL_BASED_OUTPATIENT_CLINIC_OR_DEPARTMENT_OTHER)
Admission: RE | Disposition: A | Discharge status: Home / Self Care | Payer: Self-pay | Source: Home / Self Care | Attending: Surgery

## 2022-05-20 ENCOUNTER — Other Ambulatory Visit: Payer: Self-pay

## 2022-05-20 ENCOUNTER — Encounter: Payer: Self-pay | Admitting: *Deleted

## 2022-05-20 ENCOUNTER — Ambulatory Visit: Payer: BC Managed Care – PPO

## 2022-05-20 ENCOUNTER — Encounter (HOSPITAL_BASED_OUTPATIENT_CLINIC_OR_DEPARTMENT_OTHER): Payer: Self-pay | Admitting: Surgery

## 2022-05-20 ENCOUNTER — Ambulatory Visit (HOSPITAL_BASED_OUTPATIENT_CLINIC_OR_DEPARTMENT_OTHER)
Admission: RE | Admit: 2022-05-20 | Discharge: 2022-05-20 | Disposition: A | Discharge status: Home / Self Care | Payer: BC Managed Care – PPO | Attending: Surgery | Admitting: Surgery

## 2022-05-20 DIAGNOSIS — Z6836 Body mass index (BMI) 36.0-36.9, adult: Secondary | ICD-10-CM | POA: Diagnosis not present

## 2022-05-20 DIAGNOSIS — R251 Tremor, unspecified: Secondary | ICD-10-CM | POA: Insufficient documentation

## 2022-05-20 DIAGNOSIS — F418 Other specified anxiety disorders: Secondary | ICD-10-CM | POA: Diagnosis not present

## 2022-05-20 DIAGNOSIS — C20 Malignant neoplasm of rectum: Secondary | ICD-10-CM | POA: Diagnosis not present

## 2022-05-20 DIAGNOSIS — E669 Obesity, unspecified: Secondary | ICD-10-CM | POA: Insufficient documentation

## 2022-05-20 DIAGNOSIS — Z452 Encounter for adjustment and management of vascular access device: Secondary | ICD-10-CM | POA: Diagnosis not present

## 2022-05-20 HISTORY — PX: PORT-A-CATH REMOVAL: SHX5289

## 2022-05-20 SURGERY — REMOVAL PORT-A-CATH
Anesthesia: Monitor Anesthesia Care | Site: Chest

## 2022-05-20 MED ORDER — MIDAZOLAM HCL 2 MG/2ML IJ SOLN
INTRAMUSCULAR | Status: AC
  Filled 2022-05-20: qty 2

## 2022-05-20 MED ORDER — ACETAMINOPHEN 500 MG PO TABS
ORAL_TABLET | ORAL | Status: AC
  Filled 2022-05-20: qty 2

## 2022-05-20 MED ORDER — ONDANSETRON HCL 4 MG/2ML IJ SOLN
INTRAMUSCULAR | Status: DC | PRN
  Administered 2022-05-20: 4 mg via INTRAVENOUS

## 2022-05-20 MED ORDER — ACETAMINOPHEN 500 MG PO TABS
1000.0000 mg | ORAL_TABLET | ORAL | Status: AC
  Administered 2022-05-20: 1000 mg via ORAL

## 2022-05-20 MED ORDER — PROPOFOL 10 MG/ML IV BOLUS
INTRAVENOUS | Status: DC | PRN
  Administered 2022-05-20 (×3): 20 mg via INTRAVENOUS

## 2022-05-20 MED ORDER — OXYCODONE HCL 5 MG PO TABS
5.0000 mg | ORAL_TABLET | Freq: Three times a day (TID) | ORAL | 0 refills | Status: AC | PRN
  Filled 2022-05-20: qty 5, 2d supply, fill #0

## 2022-05-20 MED ORDER — PROPOFOL 1000 MG/100ML IV EMUL
INTRAVENOUS | Status: AC
  Filled 2022-05-20: qty 100

## 2022-05-20 MED ORDER — KETAMINE HCL 10 MG/ML IJ SOLN
INTRAMUSCULAR | Status: DC | PRN
  Administered 2022-05-20: 10 mg via INTRAVENOUS

## 2022-05-20 MED ORDER — KETAMINE HCL 50 MG/5ML IJ SOSY
PREFILLED_SYRINGE | INTRAMUSCULAR | Status: AC
  Filled 2022-05-20: qty 5

## 2022-05-20 MED ORDER — FENTANYL CITRATE (PF) 100 MCG/2ML IJ SOLN
INTRAMUSCULAR | Status: DC | PRN
  Administered 2022-05-20: 50 ug via INTRAVENOUS

## 2022-05-20 MED ORDER — FENTANYL CITRATE (PF) 100 MCG/2ML IJ SOLN
INTRAMUSCULAR | Status: AC
  Filled 2022-05-20: qty 2

## 2022-05-20 MED ORDER — LACTATED RINGERS IV SOLN
INTRAVENOUS | Status: DC
  Administered 2022-05-20: 1000 mL via INTRAVENOUS

## 2022-05-20 MED ORDER — FENTANYL CITRATE (PF) 100 MCG/2ML IJ SOLN
25.0000 ug | INTRAMUSCULAR | Status: DC | PRN
  Administered 2022-05-20: 50 ug via INTRAVENOUS

## 2022-05-20 MED ORDER — PROPOFOL 500 MG/50ML IV EMUL
INTRAVENOUS | Status: DC | PRN
  Administered 2022-05-20: 50 ug/kg/min via INTRAVENOUS

## 2022-05-20 MED ORDER — GLYCOPYRROLATE 0.2 MG/ML IJ SOLN
INTRAMUSCULAR | Status: DC | PRN
  Administered 2022-05-20: .2 mg via INTRAVENOUS

## 2022-05-20 MED ORDER — 0.9 % SODIUM CHLORIDE (POUR BTL) OPTIME
TOPICAL | Status: DC | PRN
  Administered 2022-05-20: 500 mL

## 2022-05-20 MED ORDER — BUPIVACAINE-EPINEPHRINE 0.5% -1:200000 IJ SOLN
INTRAMUSCULAR | Status: DC | PRN
  Administered 2022-05-20: 7 mL

## 2022-05-20 MED ORDER — GLYCOPYRROLATE PF 0.2 MG/ML IJ SOSY
PREFILLED_SYRINGE | INTRAMUSCULAR | Status: AC
  Filled 2022-05-20: qty 1

## 2022-05-20 MED ORDER — CHLORHEXIDINE GLUCONATE CLOTH 2 % EX PADS: 6.0000 | MEDICATED_PAD | Freq: Once | CUTANEOUS | Status: DC

## 2022-05-20 MED ORDER — MIDAZOLAM HCL 5 MG/5ML IJ SOLN
INTRAMUSCULAR | Status: DC | PRN
  Administered 2022-05-20: 2 mg via INTRAVENOUS

## 2022-05-20 MED ORDER — ONDANSETRON HCL 4 MG/2ML IJ SOLN
INTRAMUSCULAR | Status: AC
  Filled 2022-05-20: qty 2

## 2022-05-20 MED ORDER — LIDOCAINE HCL (PF) 2 % IJ SOLN
INTRAMUSCULAR | Status: AC
  Filled 2022-05-20: qty 5

## 2022-05-20 MED ORDER — CEFAZOLIN SODIUM-DEXTROSE 2-4 GM/100ML-% IV SOLN
INTRAVENOUS | Status: AC
  Filled 2022-05-20: qty 100

## 2022-05-20 MED ORDER — AMISULPRIDE (ANTIEMETIC) 5 MG/2ML IV SOLN: 10.0000 mg | Freq: Once | INTRAVENOUS | Status: DC | PRN

## 2022-05-20 MED ORDER — LIDOCAINE HCL (CARDIAC) PF 100 MG/5ML IV SOSY
PREFILLED_SYRINGE | INTRAVENOUS | Status: DC | PRN
  Administered 2022-05-20: 60 mg via INTRAVENOUS

## 2022-05-20 MED ORDER — CEFAZOLIN SODIUM-DEXTROSE 2-4 GM/100ML-% IV SOLN
2.0000 g | INTRAVENOUS | Status: AC
  Administered 2022-05-20: 2 g via INTRAVENOUS

## 2022-05-20 MED ORDER — PROPOFOL 500 MG/50ML IV EMUL
INTRAVENOUS | Status: AC
  Filled 2022-05-20: qty 50

## 2022-05-20 SURGICAL SUPPLY — 32 items
ADH SKN CLS APL DERMABOND .7 (GAUZE/BANDAGES/DRESSINGS) ×1
APL PRP STRL LF DISP 70% ISPRP (MISCELLANEOUS) ×1
BLADE CLIPPER SENSICLIP SURGIC (BLADE) IMPLANT
BLADE SURG 15 STRL LF DISP TIS (BLADE) ×2 IMPLANT
BLADE SURG 15 STRL SS (BLADE) ×1
CHLORAPREP W/TINT 26 (MISCELLANEOUS) ×2 IMPLANT
COVER BACK TABLE 60X90IN (DRAPES) ×2 IMPLANT
COVER MAYO STAND STRL (DRAPES) ×2 IMPLANT
DERMABOND ADVANCED .7 DNX12 (GAUZE/BANDAGES/DRESSINGS) ×2 IMPLANT
DRAPE LAPAROTOMY TRNSV 102X78 (DRAPES) ×2 IMPLANT
DRAPE UTILITY XL STRL (DRAPES) ×2 IMPLANT
ELECT REM PT RETURN 9FT ADLT (ELECTROSURGICAL) ×1
ELECTRODE REM PT RTRN 9FT ADLT (ELECTROSURGICAL) ×2 IMPLANT
GAUZE 4X4 16PLY ~~LOC~~+RFID DBL (SPONGE) ×2 IMPLANT
GAUZE SPONGE 4X4 12PLY STRL (GAUZE/BANDAGES/DRESSINGS) IMPLANT
GLOVE BIO SURGEON STRL SZ7.5 (GLOVE) ×2 IMPLANT
GLOVE BIOGEL PI IND STRL 8 (GLOVE) ×2 IMPLANT
GOWN STRL REUS W/TWL XL LVL3 (GOWN DISPOSABLE) ×2 IMPLANT
KIT TURNOVER CYSTO (KITS) ×2 IMPLANT
NEEDLE HYPO 22GX1.5 SAFETY (NEEDLE) ×2 IMPLANT
NS IRRIG 500ML POUR BTL (IV SOLUTION) IMPLANT
PACK BASIN DAY SURGERY FS (CUSTOM PROCEDURE TRAY) ×2 IMPLANT
PENCIL SMOKE EVACUATOR (MISCELLANEOUS) ×2 IMPLANT
SPONGE T-LAP 4X18 ~~LOC~~+RFID (SPONGE) IMPLANT
SUT MNCRL AB 4-0 PS2 18 (SUTURE) ×2 IMPLANT
SUT VIC AB 3-0 SH 27 (SUTURE) ×1
SUT VIC AB 3-0 SH 27X BRD (SUTURE) ×2 IMPLANT
SYR CONTROL 10ML LL (SYRINGE) ×2 IMPLANT
TOWEL OR 17X24 6PK STRL BLUE (TOWEL DISPOSABLE) IMPLANT
TOWEL OR 17X26 10 PK STRL BLUE (TOWEL DISPOSABLE) ×2 IMPLANT
TUBE CONNECTING 12X1/4 (SUCTIONS) IMPLANT
YANKAUER SUCT BULB TIP NO VENT (SUCTIONS) IMPLANT

## 2022-05-20 NOTE — Discharge Instructions (Addendum)
POST OP INSTRUCTIONS  DIET: As tolerated. Follow a light bland diet the first 24 hours after arrival home, such as soup, liquids, crackers, etc.  Be sure to include lots of fluids daily.  Avoid fast food or heavy meals as your are more likely to get nauseated.  Eat a low fat the next few days after surgery.  Take your usually prescribed home medications unless otherwise directed.  PAIN CONTROL: Pain is best controlled by a usual combination of three different methods TOGETHER: Ice/Heat Over the counter pain medication Prescription pain medication Most patients will experience some swelling and bruising around the surgical site.  Ice packs or heating pads (30-60 minutes up to 6 times a day) will help. Some people prefer to use ice alone, heat alone, alternating between ice & heat.  Experiment to what works for you.  Swelling and bruising can take several weeks to resolve.   It is helpful to take an over-the-counter pain medication regularly for the first few weeks: Ibuprofen (Motrin/Advil) - '200mg'$  tabs - take 3 tabs ('600mg'$ ) every 6 hours as needed for pain Acetaminophen (Tylenol) - you may take '650mg'$  every 6 hours as needed. You can take this with motrin as they act differently on the body. If you are taking a narcotic pain medication that has acetaminophen in it, do not take over the counter tylenol at the same time.  Iii. NOTE: You may take both of these medications together - most patients  find it most helpful when alternating between the two (i.e. Ibuprofen at 6am,  tylenol at 9am, ibuprofen at 12pm ..Marland Kitchen) A  prescription for pain medication should be given to you upon discharge.  Take your pain medication as prescribed if your pain is not adequatly controlled with the over-the-counter pain reliefs mentioned above.  Avoid getting constipated.  Between the surgery and the pain medications, it is common to experience some constipation.  Increasing fluid intake and taking a fiber supplement (such as  Metamucil, Citrucel, FiberCon, MiraLax, etc) 1-2 times a day regularly will usually help prevent this problem from occurring.  A mild laxative (prune juice, Milk of Magnesia, MiraLax, etc) should be taken according to package directions if there are no bowel movements after 48 hours.    Dressing: Your incision is covered in Dermabond which is like sterile superglue for the skin. This will come off on it's own in a couple weeks. It is waterproof and you may bathe normally starting the day after your surgery in a shower. Avoid baths/pools/lakes/oceans until your wounds have fully healed.  ACTIVITIES as tolerated:   Avoid heavy lifting (>10lbs or 1 gallon of milk) for the next 6 weeks. You may resume regular (light) daily activities beginning the next day--such as daily self-care, walking, climbing stairs--gradually increasing activities as tolerated.  If you can walk 30 minutes without difficulty, it is safe to try more intense activity such as jogging, treadmill, bicycling, low-impact aerobics.  DO NOT PUSH THROUGH PAIN.  Let pain be your guide: If it hurts to do something, don't do it. You may drive when you are no longer taking prescription pain medication, you can comfortably wear a seatbelt, and you can safely maneuver your car and apply brakes.   FOLLOW UP in our office Please call [facility] at [phone] to set up an appointment to see your surgeon in the office for a follow-up appointment approximately 2 weeks after your surgery. Make sure that you call for this appointment the day you arrive home to  insure a convenient appointment time.  9. If you have disability or family leave forms that need to be completed, you may have them completed by your primary care physician's office; for return to work instructions, please ask our office staff and they will be happy to assist you in obtaining this documentation   When to call us [phone]: Poor pain control Reactions / problems with new  medications (rash/itching, etc)  Fever over 101.5 F (38.5 C) Inability to urinate Nausea/vomiting Worsening swelling or bruising Continued bleeding from incision. Increased pain, redness, or drainage from the incision  The clinic staff is available to answer your questions during regular business hours (8:30am-5pm).  Please don't hesitate to call and ask to speak to one of our nurses for clinical concerns.   A surgeon from [facility] is always on call at the hospitals   If you have a medical emergency, go to the nearest emergency room or call 911.  [facility] [address], [place], [place], [place]  [address] MAIN: [id number] FAX: [phone] [email or web address]      No acetaminophen/Tylenol until after 3:40 pm today if needed.     Post Anesthesia Home Care Instructions  Activity: Get plenty of rest for the remainder of the day. A responsible individual must stay with you for 24 hours following the procedure.  For the next 24 hours, DO NOT: -Drive a car -Paediatric nurse -Drink alcoholic beverages -Take any medication unless instructed by your physician -Make any legal decisions or sign important papers.  Meals: Start with liquid foods such as gelatin or soup. Progress to regular foods as tolerated. Avoid greasy, spicy, heavy foods. If nausea and/or vomiting occur, drink only clear liquids until the nausea and/or vomiting subsides. Call your physician if vomiting continues.  Special Instructions/Symptoms: Your throat may feel dry or sore from the anesthesia or the breathing tube placed in your throat during surgery. If this causes discomfort, gargle with warm salt water. The discomfort should disappear within 24 hours.

## 2022-05-20 NOTE — Progress Notes (Signed)
Received request from [facility] case manager for last two office notes/labs. These were faxed to [phone].

## 2022-05-20 NOTE — H&P (Signed)
CC: Here today for surgery  HPI: Billy Roman is an 61 y.o. male with history of tobacco use, recent tremors (follows with neurology), whom is seen in the office today as a referral by Dr. Pasty Arch for evaluation of rectal cancer.  He underwent colonoscopy 11/19/21 -  - Preparation of the colon was fair. - Malignant partially obstructing tumor in the rectum. Biopsied. 12-19 cm estimated location - not palpable w/ finger. TATTOO x 2 distal to tumor placed in rectum w/ SPOT - Two 5 mm polyps in the sigmoid colon and in the ascending colon, removed with a cold snare. Resected and retrieved. - Diverticulosis in the sigmoid colon. - External and internal hemorrhoids. - The examination was otherwise normal on direct and retroflexion views.  PATH:  1. Sigmoid Colon Polyp, x 1 and ascending x 1 FINDINGS CONSISTENT WITH TUBULAR ADENOMA. NO HIGH-GRADE DYSPLASIA OR MALIGNANCY IS SEEN. 2. Rectum, biopsy, mass :MODERATELY TO POORLY DIFFERENTIATED ADENOCARCINOMA.  CEA 11/19/21 - 2.9 CT CAP 11/30/21 1. Extensive mural thickening in the distal sigmoid colon and proximal rectum suggesting infiltrative colorectal neoplasm with haziness of the mesorectal fat and adjacent mesorectal lymphadenopathy concerning for local direct invasion and nodal disease. 2. Small pulmonary nodules measuring 5 mm or less in the lungs, nonspecific. Metastatic disease is not excluded, but not favored on the basis of today's examination. Attention on follow-up studies is recommended to ensure stability. 3. No definite metastatic disease to the abdomen. 4. Aortic atherosclerosis, in addition to three-vessel coronary artery disease. Please note that although the presence of coronary artery calcium documents the presence of coronary artery disease, the severity of this disease and any potential stenosis cannot be assessed on this non-gated CT examination. Assessment for potential risk factor modification, dietary  therapy or pharmacologic therapy may be warranted, if clinically indicated. 5. Additional incidental findings, as above.  MRI Pelvis 12/03/21 - pending read. I was able to only see the images and this does appear to be in the mid and upper rectum encroaching on the sigmoid as well.  He is here today with his daughter whom is a previous patient of our practice, having had bariatric surgery with Dr. Kieth Brightly.  INTERVAL HX PAC placement 12/17/21 Underwent systemic therapy with Dr. Benay Spice. He is now undergoing Xeloda radiation having completed FOLFOX successfully. He would like to Port-A-Cath removed prior to the year and and therefore Dr. Benay Spice sent him back to see me.  XRT started 04/22/22, completing 05/31/22. Denies any complaints at present. He has been doing quite well. No nausea or vomiting. Having regular bowel movements. Working on quitting smoking.  PMH: tobacco use, tremors  PSH: He denies any prior abdominal or pelvic surgical history.  FHx: Denies any known family history of colorectal, breast, endometrial or ovarian cancer  Social Hx: Smokes approximately 1 pack/day for 40 years. Social EtOH use.   Past Medical History:  Diagnosis Date   Anxiety    Arrhythmia    Complication of anesthesia    Nausea   [name], recurrent severe, without psychosis ([place]) 08/23/2021   Pneumonia    Rectal cancer ([place]) 11/20/2021   Sleep apnea     Past Surgical History:  Procedure Laterality Date   OPERATIVE ULTRASOUND Right 12/17/2021   Procedure: OPERATIVE ULTRASOUND;  Surgeon: Ileana Roup, MD;  Location: [place];  Service: General;  Laterality: Right;   PORTACATH PLACEMENT Right 12/17/2021   Procedure: INSERTION PORT-A-CATH;  Surgeon: Ileana Roup, MD;  Location: [facility];  Service: General;  Laterality: Right;   RHINOPLASTY     SEPTOPLASTY     SHOULDER ARTHROSCOPY Left    SHOULDER ARTHROSCOPY WITH OPEN ROTATOR CUFF REPAIR AND DISTAL CLAVICLE  ACROMINECTOMY Right 02/01/2021   Procedure: RIGHT SHOULDER ARTHROSCOPY WITH MINI OPEN ROTATOR CUFF REPAIR AND DISTAL CLAVICLE EXCISION;  Surgeon: Thornton Park, MD;  Location: [facility];  Service: Orthopedics;  Laterality: Right;   SPINE SURGERY     lumbar laminectomy   WRIST SURGERY Bilateral    corpal tunnel    Family History  Adopted: Yes  Problem Relation Age of Onset   Colon cancer Neg Hx    Stomach cancer Neg Hx    Esophageal cancer Neg Hx     Social:  reports that he has been smoking cigarettes. He has a 30.00 pack-year smoking history. He has never been exposed to tobacco smoke. He quit smokeless tobacco use about 33 years ago.  His smokeless tobacco use included chew. He reports current alcohol use of about 2.0 standard drinks of alcohol per week. He reports current drug use. Frequency: 1.00 time per week. Drug: Marijuana.  Allergies:  Allergies  Allergen Reactions   Propyphenazone Anaphylaxis, Itching and Rash    Felt hot    Medications: I have reviewed the patient's current medications.  No results found for this or any previous visit (from the past 48 hour(s)).  No results found.  ROS - all of the below systems have been reviewed with the patient and positives are indicated with bold text General: chills, fever or night sweats Eyes: blurry vision or double vision ENT: epistaxis or sore throat Allergy/Immunology: itchy/watery eyes or nasal congestion Hematologic/Lymphatic: bleeding problems, blood clots or swollen lymph nodes Endocrine: temperature intolerance or unexpected weight changes Breast: new or changing breast lumps or nipple discharge Resp: cough, shortness of breath, or wheezing CV: chest pain or dyspnea on exertion GI: as per HPI GU: dysuria, trouble voiding, or hematuria MSK: joint pain or joint stiffness Neuro: TIA or stroke symptoms Derm: pruritus and skin lesion changes Psych: anxiety and depression  PE There were no vitals taken for this  visit. Constitutional: NAD; conversant Eyes: Moist conjunctiva CV: RRR MSK: Normal range of motion of extremities Psychiatric: Appropriate affect  No results found for this or any previous visit (from the past 48 hour(s)).  No results found.  A/P: Billy Roman is an 61 y.o. male with hx of tobacco use, recent tremors being followed with for mid and upper rectal cancer CT CAP -no evidence of metastatic disease but there are suspicious appearing lymph nodes. 5 mm pulmonary nodules that will need surveillance.  MRI pelvis 12/03/2021- [id number] with threatened CRM from lymph node  Undergoing TNT with Dr. Benay Spice, completed systemic treatment and is now on chemo/XRT - planned completion date 12/15  -He is strongly desires Port-A-Cath removal this year. We spent time discussing portacatheter removal, material risks (including, but not limited to, pain, bleeding, infection, disruption of device, need for additional procedures, embolus, heart attack, stroke, death). He expressed understanding of all the above and has opted to proceed.  -We will see him back in January for follow-up and further discussions with surgery. Tentative dates being 07/22/2022 to 08/02/22 if possible. We are working on securing a date now before his next visit with [place] to at least begin planning.   Nadeen Landau, [facility], [place]

## 2022-05-20 NOTE — Transfer of Care (Signed)
Immediate Anesthesia Transfer of Care Note  Patient: EARL LOSEE  Procedure(s) Performed: Procedure(s) (LRB): REMOVAL PORT-A-CATH (N/A)  Patient Location: PACU  Anesthesia Type: MAC  Level of Consciousness: awake, alert  and oriented  Airway & Oxygen Therapy: Patient Spontanous Breathing and Patient connected to face mask oxygen  Post-op Assessment: Report given to PACU RN and Post -op Vital signs reviewed and stable  Post vital signs: Reviewed and stable  Complications: No apparent anesthesia complications  Last Vitals:  Vitals Value Taken Time  BP 108/69 05/20/22 1045  Temp    Pulse 72 05/20/22 1048  Resp 15 05/20/22 1048  SpO2 99 % 05/20/22 1048  Vitals shown include unvalidated device data.  Last Pain:  Vitals:   05/20/22 0914  TempSrc: Oral  PainSc: 0-No pain      Patients Stated Pain Goal: 3 (73/22/02 5427)  Complications: No notable events documented.

## 2022-05-20 NOTE — Anesthesia Procedure Notes (Addendum)
Procedure Name: MAC Date/Time: 05/20/2022 10:10 AM  Performed by: Mechele Claude, CRNAPre-anesthesia Checklist: Patient identified, Emergency Drugs available, Suction available and Patient being monitored Oxygen Delivery Method: Simple face mask Airway Equipment and Method: Oral airway Placement Confirmation: positive ETCO2 and breath sounds checked- equal and bilateral

## 2022-05-20 NOTE — Op Note (Signed)
05/20/2022  10:42 AM  PATIENT:  Billy Roman  61 y.o. male  Patient Care Team: Camillia Herter, NP as PCP - General (Nurse Practitioner) Lin Givens, RN as Oncology Nurse Navigator  PRE-OPERATIVE DIAGNOSIS:  Rectal cancer  POST-OPERATIVE DIAGNOSIS:  Same  PROCEDURE:  Removal of right internal jugular port-a-catheter  SURGEON:  Sharon Mt. Keonta Alsip, MD  ASSISTANT: Sheldon Silvan, MD  ANESTHESIA:   local and MAC  COUNTS:  Sponge, needle and instrument counts were reported correct x2 at the conclusion of the operation.  EBL: 2 mL  DRAINS: None  SPECIMEN: None  COMPLICATIONS: None  FINDINGS: Right IJ port-a-catheter removed intact uneventfully  DISPOSITION: PACU in satisfactory condition  DESCRIPTION: The patient was identified in preop holding and taken to the OR where he was placed on the operating room table. SCDs were placed. MAC anesthesia was induced without difficulty. He was then prepped and draped in the usual sterile fashion. A surgical timeout was performed indicating the correct patient, procedure, positioning and need for preoperative antibiotics.   Local anesthetic was infiltrated into the skin overlying his right chest wall portacatheter.  The previous scar was then utilized for extraction incision.  The skin was incised.  Subcutaneous tissue divided electrocautery.  The port catheter pocket is also incised.  The port was identified.  The Prolene sutures were then cut and removed.  The port is now free from the pocket.  Pressure was then applied by RN as the test to the right IJ puncture site and the port catheter was removed in 1 piece.  The end of the tube is inspected and noted to be intact.  There is no significant fibrin sheath on the tip either.  The site in the subcutaneous tissue of the tubing exit of the pocket was then closed with a 3-0 Vicryl figure-of-eight suture.  The port pocket is rinsed and hemostasis verified.  This wound was then closed  in layers using 3-0 Vicryl deep dermal sutures followed by running 4 Monocryl subcuticular suture.  All sponge, needle, and instrument counts were reported correct.  The skin is then washed and dried.  Dressing consisting of Dermabond is applied.  He is then awakened from anesthesia and transferred to a stretcher for transport to recovery in satisfactory condition.

## 2022-05-20 NOTE — Anesthesia Preprocedure Evaluation (Signed)
Anesthesia Evaluation  Patient identified by MRN, date of birth, ID band Patient awake    Reviewed: Allergy & Precautions, NPO status , Patient's Chart, lab work & pertinent test results  History of Anesthesia Complications (+) PONV and history of anesthetic complications  Airway Mallampati: III  TM Distance: >3 FB Neck ROM: Full    Dental no notable dental hx. (+) Dental Advisory Given   Pulmonary sleep apnea , pneumonia, resolved, Current SmokerPatient did not abstain from smoking.   Pulmonary exam normal breath sounds clear to auscultation       Cardiovascular + dysrhythmias  Rhythm:Regular Rate:Normal  EKG 09/2021 NSR, incomplete RBBB pattern, LAD, ?RVH   Neuro/Psych  PSYCHIATRIC DISORDERS Anxiety Depression    Hx/o cognitive impairment Neuromuscular disease    GI/Hepatic negative GI ROS,,,(+)     substance abuse  alcohol use and marijuana useRectal Ca   Endo/Other  negative endocrine ROS    Renal/GU negative Renal ROS  negative genitourinary   Musculoskeletal negative musculoskeletal ROS (+)    Abdominal  (+) + obese  Peds  Hematology negative hematology ROS (+)   Anesthesia Other Findings RECTAL CANCER  Reproductive/Obstetrics                             Anesthesia Physical Anesthesia Plan  ASA: 3  Anesthesia Plan: MAC   Post-op Pain Management: Minimal or no pain anticipated and Tylenol PO (pre-op)*   Induction: Intravenous  PONV Risk Score and Plan: 3 and Ondansetron, Midazolam, Treatment may vary due to age or medical condition and Propofol infusion  Airway Management Planned: Natural Airway and Simple Face Mask  Additional Equipment: None  Intra-op Plan:   Post-operative Plan:   Informed Consent: I have reviewed the patients History and Physical, chart, labs and discussed the procedure including the risks, benefits and alternatives for the proposed anesthesia with  the patient or authorized representative who has indicated his/her understanding and acceptance.     Dental advisory given  Plan Discussed with: CRNA and Anesthesiologist  Anesthesia Plan Comments:         Anesthesia Quick Evaluation

## 2022-05-21 ENCOUNTER — Other Ambulatory Visit (HOSPITAL_COMMUNITY): Payer: Self-pay

## 2022-05-21 ENCOUNTER — Inpatient Hospital Stay: Payer: BC Managed Care – PPO

## 2022-05-21 ENCOUNTER — Other Ambulatory Visit: Payer: Self-pay

## 2022-05-21 ENCOUNTER — Encounter (HOSPITAL_BASED_OUTPATIENT_CLINIC_OR_DEPARTMENT_OTHER): Payer: Self-pay | Admitting: Surgery

## 2022-05-21 ENCOUNTER — Ambulatory Visit
Admission: RE | Admit: 2022-05-21 | Discharge: 2022-05-21 | Disposition: A | Discharge status: Home / Self Care | Payer: BC Managed Care – PPO | Source: Ambulatory Visit | Attending: Radiation Oncology | Admitting: Radiation Oncology

## 2022-05-21 DIAGNOSIS — C20 Malignant neoplasm of rectum: Secondary | ICD-10-CM | POA: Diagnosis not present

## 2022-05-21 DIAGNOSIS — Z510 Encounter for antineoplastic radiation therapy: Secondary | ICD-10-CM | POA: Diagnosis not present

## 2022-05-21 LAB — RAD ONC ARIA SESSION SUMMARY
Course Elapsed Days: 29
Plan Fractions Treated to Date: 20
Plan Prescribed Dose Per Fraction: 1.8 Gy
Plan Total Fractions Prescribed: 25
Plan Total Prescribed Dose: 45 Gy
Reference Point Dosage Given to Date: 36 Gy
Reference Point Session Dosage Given: 1.8 Gy
Session Number: 20

## 2022-05-21 NOTE — Anesthesia Postprocedure Evaluation (Signed)
Anesthesia Post Note  Patient: Billy Roman  Procedure(s) Performed: REMOVAL PORT-A-CATH (Chest)     Patient location during evaluation: PACU Anesthesia Type: MAC Level of consciousness: awake and alert Pain management: pain level controlled Vital Signs Assessment: post-procedure vital signs reviewed and stable Respiratory status: spontaneous breathing, nonlabored ventilation, respiratory function stable and patient connected to nasal cannula oxygen Cardiovascular status: stable and blood pressure returned to baseline Postop Assessment: no apparent nausea or vomiting Anesthetic complications: no   No notable events documented.  Last Vitals:  Vitals:   05/20/22 1130 05/20/22 1146  BP: 113/79 (!) 128/93  Pulse: 69 72  Resp: (!) 27 16  Temp:    SpO2: 94% 97%    Last Pain:  Vitals:   05/20/22 1146  TempSrc:   PainSc: 0-No pain   Pain Goal: Patients Stated Pain Goal: 3 (05/20/22 0914)                 Tiajuana Amass

## 2022-05-22 ENCOUNTER — Other Ambulatory Visit: Payer: BC Managed Care – PPO

## 2022-05-22 ENCOUNTER — Ambulatory Visit
Admission: RE | Admit: 2022-05-22 | Discharge: 2022-05-22 | Disposition: A | Discharge status: Home / Self Care | Payer: BC Managed Care – PPO | Source: Ambulatory Visit | Attending: Radiation Oncology | Admitting: Radiation Oncology

## 2022-05-22 ENCOUNTER — Other Ambulatory Visit: Payer: Self-pay

## 2022-05-22 ENCOUNTER — Ambulatory Visit: Payer: BC Managed Care – PPO | Admitting: Nurse Practitioner

## 2022-05-22 ENCOUNTER — Inpatient Hospital Stay: Payer: BC Managed Care – PPO

## 2022-05-22 DIAGNOSIS — Z510 Encounter for antineoplastic radiation therapy: Secondary | ICD-10-CM | POA: Diagnosis not present

## 2022-05-22 DIAGNOSIS — C20 Malignant neoplasm of rectum: Secondary | ICD-10-CM | POA: Diagnosis not present

## 2022-05-22 LAB — RAD ONC ARIA SESSION SUMMARY
Course Elapsed Days: 30
Plan Fractions Treated to Date: 21
Plan Prescribed Dose Per Fraction: 1.8 Gy
Plan Total Fractions Prescribed: 25
Plan Total Prescribed Dose: 45 Gy
Reference Point Dosage Given to Date: 37.8 Gy
Reference Point Session Dosage Given: 1.8 Gy
Session Number: 21

## 2022-05-23 ENCOUNTER — Ambulatory Visit
Admission: RE | Admit: 2022-05-23 | Discharge: 2022-05-23 | Disposition: A | Discharge status: Home / Self Care | Payer: BC Managed Care – PPO | Source: Ambulatory Visit | Attending: Radiation Oncology | Admitting: Radiation Oncology

## 2022-05-23 ENCOUNTER — Other Ambulatory Visit: Payer: Self-pay

## 2022-05-23 ENCOUNTER — Inpatient Hospital Stay: Payer: BC Managed Care – PPO

## 2022-05-23 DIAGNOSIS — C20 Malignant neoplasm of rectum: Secondary | ICD-10-CM | POA: Diagnosis not present

## 2022-05-23 DIAGNOSIS — Z510 Encounter for antineoplastic radiation therapy: Secondary | ICD-10-CM | POA: Diagnosis not present

## 2022-05-23 LAB — RAD ONC ARIA SESSION SUMMARY
Course Elapsed Days: 31
Plan Fractions Treated to Date: 22
Plan Prescribed Dose Per Fraction: 1.8 Gy
Plan Total Fractions Prescribed: 25
Plan Total Prescribed Dose: 45 Gy
Reference Point Dosage Given to Date: 39.6 Gy
Reference Point Session Dosage Given: 1.8 Gy
Session Number: 22

## 2022-05-24 ENCOUNTER — Other Ambulatory Visit: Payer: Self-pay

## 2022-05-24 ENCOUNTER — Other Ambulatory Visit: Payer: BC Managed Care – PPO

## 2022-05-24 ENCOUNTER — Inpatient Hospital Stay: Payer: BC Managed Care – PPO

## 2022-05-24 ENCOUNTER — Inpatient Hospital Stay (HOSPITAL_BASED_OUTPATIENT_CLINIC_OR_DEPARTMENT_OTHER): Payer: BC Managed Care – PPO | Admitting: Nurse Practitioner

## 2022-05-24 ENCOUNTER — Encounter: Payer: Self-pay | Admitting: Nurse Practitioner

## 2022-05-24 ENCOUNTER — Ambulatory Visit
Admission: RE | Admit: 2022-05-24 | Discharge: 2022-05-24 | Disposition: A | Discharge status: Home / Self Care | Payer: BC Managed Care – PPO | Source: Ambulatory Visit | Attending: Radiation Oncology | Admitting: Radiation Oncology

## 2022-05-24 VITALS — BP 137/52 | HR 81 | Temp 98.2°F | Resp 18 | Ht 73.0 in | Wt 277.0 lb

## 2022-05-24 DIAGNOSIS — C20 Malignant neoplasm of rectum: Secondary | ICD-10-CM

## 2022-05-24 DIAGNOSIS — Z510 Encounter for antineoplastic radiation therapy: Secondary | ICD-10-CM | POA: Diagnosis not present

## 2022-05-24 LAB — CMP (CANCER CENTER ONLY)
ALT: 32 U/L (ref 0–44)
AST: 28 U/L (ref 15–41)
Albumin: 4.6 g/dL (ref 3.5–5.0)
Alkaline Phosphatase: 56 U/L (ref 38–126)
Anion gap: 10 (ref 5–15)
BUN: 15 mg/dL (ref 6–20)
CO2: 26 mmol/L (ref 22–32)
Calcium: 9.9 mg/dL (ref 8.9–10.3)
Chloride: 102 mmol/L (ref 98–111)
Creatinine: 0.76 mg/dL (ref 0.61–1.24)
GFR, Estimated: >60 mL/min (ref >60)
Glucose, Bld: 124 mg/dL — ABNORMAL HIGH (ref 70–99)
Potassium: 4.4 mmol/L (ref 3.5–5.1)
Sodium: 138 mmol/L (ref 135–145)
Total Bilirubin: 0.6 mg/dL (ref 0.3–1.2)
Total Protein: 7.9 g/dL (ref 6.5–8.1)

## 2022-05-24 LAB — RAD ONC ARIA SESSION SUMMARY
Course Elapsed Days: 32
Plan Fractions Treated to Date: 23
Plan Prescribed Dose Per Fraction: 1.8 Gy
Plan Total Fractions Prescribed: 25
Plan Total Prescribed Dose: 45 Gy
Reference Point Dosage Given to Date: 41.4 Gy
Reference Point Session Dosage Given: 1.8 Gy
Session Number: 23

## 2022-05-24 LAB — CBC WITH DIFFERENTIAL (CANCER CENTER ONLY)
Abs Immature Granulocytes: 0.03 10*3/uL (ref 0.00–0.07)
Basophils Absolute: 0 10*3/uL (ref 0.0–0.1)
Basophils Relative: 0 %
Eosinophils Absolute: 0 10*3/uL (ref 0.0–0.5)
Eosinophils Relative: 0 %
HCT: 44.1 % (ref 39.0–52.0)
Hemoglobin: 15 g/dL (ref 13.0–17.0)
Immature Granulocytes: 0 %
Lymphocytes Relative: 5 %
Lymphs Abs: 0.3 10*3/uL — ABNORMAL LOW (ref 0.7–4.0)
MCH: 32.6 pg (ref 26.0–34.0)
MCHC: 34 g/dL (ref 30.0–36.0)
MCV: 95.9 fL (ref 80.0–100.0)
Monocytes Absolute: 0.3 10*3/uL (ref 0.1–1.0)
Monocytes Relative: 5 %
Neutro Abs: 6.2 10*3/uL (ref 1.7–7.7)
Neutrophils Relative %: 90 %
Platelet Count: 205 10*3/uL (ref 150–400)
RBC: 4.6 MIL/uL (ref 4.22–5.81)
RDW: 14.9 % (ref 11.5–15.5)
WBC Count: 6.9 10*3/uL (ref 4.0–10.5)
nRBC: 0 % (ref 0.0–0.2)

## 2022-05-24 LAB — CEA (ACCESS): CEA (CHCC): 5.94 ng/mL — ABNORMAL HIGH (ref 0.00–5.00)

## 2022-05-24 NOTE — Progress Notes (Unsigned)
  [facility] OFFICE PROGRESS NOTE   Diagnosis: Rectal cancer  INTERVAL HISTORY:   [name] returns as scheduled.  He continues radiation/Xeloda.  He has intermittent nausea.  He has 2 nausea medications at home and reports neither are effective..  No mouth sores.  No diarrhea.  No hand or foot pain or redness.  Objective:  Vital signs in last 24 hours:  Blood pressure (!) 137/52, pulse 81, temperature 98.2 F (36.8 C), temperature source Oral, resp. rate 18, height '6\' 1"'$  (1.854 m), weight 277 lb (125.6 kg), SpO2 100 %.    HEENT: No thrush or ulcers. Resp: Distant breath sounds.  No respiratory distress. Cardio: Regular rate and rhythm. GI: Abdomen soft and nontender.  No hepatomegaly. Vascular: No leg edema. Skin: Palms and soles with mild erythema, no skin breakdown. Resolving ecchymosis surrounding previous Port-A-Cath site.  Lab Results:  Lab Results  Component Value Date   WBC 6.9 05/24/2022   HGB 15.0 05/24/2022   HCT 44.1 05/24/2022   MCV 95.9 05/24/2022   PLT 205 05/24/2022   NEUTROABS 6.2 05/24/2022    Imaging:  No results found.  Medications: I have reviewed the patient's current medications.  Assessment/Plan: Adenocarcinoma of the proximal rectum/distal sigmoid Colonoscopy 11/19/2021-partially obstructing mass at 12-19 cm, biopsy moderate to poorly differentiated adenocarcinoma Mildly elevated CEA CTs 11/29/2021-mural thickening in the distal sigmoid/proximal rectum with haziness of the mesorectal fat, mesorectal lymphadenopathy, small pulmonary nodules favored to be benign MRI pelvis 12/03/2021-tumor at 10 cm from the anal verge, T3c, approximately 10 metastatic nodes in the mesorectal sheath and presacral space, no extra mesorectal lymphadenopathy, N2 Cycle 1 FOLFOX 12/19/2021 Cycle 2 FOLFOX 01/02/2022, home Decadron prophylaxis added for delayed nausea Cycle 3 FOLFOX 01/15/2022 Cycle 4 FOLFOX 01/29/2022, 5-FU bolus, leucovorin, and  oxaliplatin dose reduced secondary to neutropenia and thrombocytopenia Cycle 5 FOLFOX 02/12/2022, Udenyca Cycle 6 FOLFOX 02/26/2022, Udenyca Cycle 7 FOLFOX 03/12/2022, Udenyca  Cycle 8 FOLFOX 03/26/2022, oxaliplatin held due to neuropathy, Udenyca held Radiation/Xeloda 04/22/2022 2.   Major depressive disorder Change in bowel habits and rectal bleeding secondary to #1 Ongoing tobacco use Report of blurred vision and diplopia Peripheral neuropathy-fingers and left foot  Disposition: [name] appears unchanged.  He is completing the course of radiation/Xeloda.  The last radiation treatment is 05/31/2022.  He has follow-up with Dr. Dema Severin 06/20/2022.  He is having intermittent nausea.  He has 2 nausea medications at home that are not effective.  He is unsure of the name of the medications.  He will call us back with this information.  He would like to quit smoking.  He will contact his PCP to discuss options.  We will see him in follow-up in approximately 1 month.  We are available to see him sooner if needed.  Billy Roman ANP/GNP-BC   05/24/2022  2:33 PM

## 2022-05-27 ENCOUNTER — Inpatient Hospital Stay: Payer: BC Managed Care – PPO

## 2022-05-27 ENCOUNTER — Ambulatory Visit
Admission: RE | Admit: 2022-05-27 | Discharge: 2022-05-27 | Disposition: A | Discharge status: Home / Self Care | Payer: BC Managed Care – PPO | Source: Ambulatory Visit | Attending: Radiation Oncology | Admitting: Radiation Oncology

## 2022-05-27 ENCOUNTER — Encounter: Payer: Self-pay | Admitting: Oncology

## 2022-05-27 ENCOUNTER — Other Ambulatory Visit: Payer: Self-pay

## 2022-05-27 DIAGNOSIS — Z510 Encounter for antineoplastic radiation therapy: Secondary | ICD-10-CM | POA: Diagnosis not present

## 2022-05-27 DIAGNOSIS — C20 Malignant neoplasm of rectum: Secondary | ICD-10-CM | POA: Diagnosis not present

## 2022-05-27 LAB — RAD ONC ARIA SESSION SUMMARY
Course Elapsed Days: 35
Plan Fractions Treated to Date: 24
Plan Prescribed Dose Per Fraction: 1.8 Gy
Plan Total Fractions Prescribed: 25
Plan Total Prescribed Dose: 45 Gy
Reference Point Dosage Given to Date: 43.2 Gy
Reference Point Session Dosage Given: 1.8 Gy
Session Number: 24

## 2022-05-27 NOTE — Progress Notes (Signed)
[facility] CSW Progress Note  Clinical Education officer, museum contacted patient by phone per his request.  He stated he is utilizing Chart[employer] certified accountant for his health insurance and that it is very expensive.  He asked for other options.  CSW gave him the number and website address for the [employer] which will assist in this process.  He has also applied for disability through his previous employers recommended attorneys.  He expressed no other needs.    Rodman Pickle Dawood Spitler, LCSW

## 2022-05-28 ENCOUNTER — Other Ambulatory Visit: Payer: Self-pay

## 2022-05-28 ENCOUNTER — Ambulatory Visit: Payer: BC Managed Care – PPO

## 2022-05-28 ENCOUNTER — Inpatient Hospital Stay: Payer: BC Managed Care – PPO

## 2022-05-28 ENCOUNTER — Ambulatory Visit
Admission: RE | Admit: 2022-05-28 | Discharge: 2022-05-28 | Disposition: A | Discharge status: Home / Self Care | Payer: BC Managed Care – PPO | Source: Ambulatory Visit | Attending: Radiation Oncology | Admitting: Radiation Oncology

## 2022-05-28 DIAGNOSIS — Z510 Encounter for antineoplastic radiation therapy: Secondary | ICD-10-CM | POA: Diagnosis not present

## 2022-05-28 DIAGNOSIS — C20 Malignant neoplasm of rectum: Secondary | ICD-10-CM | POA: Diagnosis not present

## 2022-05-28 LAB — RAD ONC ARIA SESSION SUMMARY
Course Elapsed Days: 36
Plan Fractions Treated to Date: 25
Plan Prescribed Dose Per Fraction: 1.8 Gy
Plan Total Fractions Prescribed: 25
Plan Total Prescribed Dose: 45 Gy
Reference Point Dosage Given to Date: 45 Gy
Reference Point Session Dosage Given: 1.8 Gy
Session Number: 25

## 2022-05-29 ENCOUNTER — Other Ambulatory Visit: Payer: Self-pay

## 2022-05-29 ENCOUNTER — Inpatient Hospital Stay: Payer: BC Managed Care – PPO

## 2022-05-29 ENCOUNTER — Ambulatory Visit
Admission: RE | Admit: 2022-05-29 | Discharge: 2022-05-29 | Disposition: A | Discharge status: Home / Self Care | Payer: BC Managed Care – PPO | Source: Ambulatory Visit | Attending: Radiation Oncology | Admitting: Radiation Oncology

## 2022-05-29 ENCOUNTER — Ambulatory Visit: Payer: BC Managed Care – PPO

## 2022-05-29 DIAGNOSIS — Z510 Encounter for antineoplastic radiation therapy: Secondary | ICD-10-CM | POA: Diagnosis not present

## 2022-05-29 DIAGNOSIS — C20 Malignant neoplasm of rectum: Secondary | ICD-10-CM | POA: Diagnosis not present

## 2022-05-29 LAB — RAD ONC ARIA SESSION SUMMARY
Course Elapsed Days: 37
Plan Fractions Treated to Date: 1
Plan Prescribed Dose Per Fraction: 1.8 Gy
Plan Total Fractions Prescribed: 3
Plan Total Prescribed Dose: 5.4 Gy
Reference Point Dosage Given to Date: 1.8 Gy
Reference Point Session Dosage Given: 1.8 Gy
Session Number: 26

## 2022-05-30 ENCOUNTER — Ambulatory Visit: Payer: BC Managed Care – PPO

## 2022-05-30 ENCOUNTER — Telehealth: Payer: Self-pay

## 2022-05-30 ENCOUNTER — Inpatient Hospital Stay: Payer: BC Managed Care – PPO

## 2022-05-30 ENCOUNTER — Ambulatory Visit
Admission: RE | Admit: 2022-05-30 | Discharge: 2022-05-30 | Disposition: A | Discharge status: Home / Self Care | Payer: BC Managed Care – PPO | Source: Ambulatory Visit | Attending: Radiation Oncology | Admitting: Radiation Oncology

## 2022-05-30 ENCOUNTER — Other Ambulatory Visit: Payer: Self-pay

## 2022-05-30 DIAGNOSIS — C20 Malignant neoplasm of rectum: Secondary | ICD-10-CM | POA: Diagnosis not present

## 2022-05-30 LAB — RAD ONC ARIA SESSION SUMMARY
Course Elapsed Days: 38
Plan Fractions Treated to Date: 2
Plan Prescribed Dose Per Fraction: 1.8 Gy
Plan Total Fractions Prescribed: 3
Plan Total Prescribed Dose: 5.4 Gy
Reference Point Dosage Given to Date: 3.6 Gy
Reference Point Session Dosage Given: 1.8 Gy
Session Number: 27

## 2022-05-30 NOTE — Progress Notes (Signed)
[place] CSW Progress Note  Holiday representative met with patient to discuss his disability.  Tomorrow will be his last radiation treatment and he is very thankful.  Patient said he reviewed the [employer] website for assistance with choosing government insurance plans.  He said it was more cost effective to continue paying for his [employer].  He was very engaging and displayed a bright affect.  No other needs at this time.    Rodman Pickle Erva Koke, LCSW

## 2022-05-30 NOTE — Telephone Encounter (Signed)
Mr Meno called states he stop taking  his Xeloda. He is having  gas, discharge, and uncontrollable diarrhea. I ask the patient how many time he went today he stated no diarrhea today. You had a bowel moment today small chuck of stools, no blood. I advice the patient to take imodium if the diarrhea recur.

## 2022-05-31 ENCOUNTER — Encounter: Payer: Self-pay | Admitting: Radiation Oncology

## 2022-05-31 ENCOUNTER — Ambulatory Visit: Payer: BC Managed Care – PPO

## 2022-05-31 ENCOUNTER — Inpatient Hospital Stay: Payer: BC Managed Care – PPO

## 2022-06-01 ENCOUNTER — Emergency Department (HOSPITAL_COMMUNITY): Payer: BC Managed Care – PPO

## 2022-06-01 ENCOUNTER — Emergency Department (HOSPITAL_COMMUNITY)
Admission: EM | Admit: 2022-06-01 | Discharge: 2022-06-01 | Disposition: A | Discharge status: Home / Self Care | Payer: BC Managed Care – PPO | Attending: Emergency Medicine | Admitting: Emergency Medicine

## 2022-06-01 DIAGNOSIS — R112 Nausea with vomiting, unspecified: Secondary | ICD-10-CM | POA: Diagnosis not present

## 2022-06-01 DIAGNOSIS — Z1152 Encounter for screening for COVID-19: Secondary | ICD-10-CM | POA: Diagnosis not present

## 2022-06-01 DIAGNOSIS — R0600 Dyspnea, unspecified: Secondary | ICD-10-CM | POA: Insufficient documentation

## 2022-06-01 DIAGNOSIS — R110 Nausea: Secondary | ICD-10-CM | POA: Diagnosis not present

## 2022-06-01 DIAGNOSIS — R197 Diarrhea, unspecified: Secondary | ICD-10-CM | POA: Insufficient documentation

## 2022-06-01 DIAGNOSIS — R0602 Shortness of breath: Secondary | ICD-10-CM | POA: Diagnosis not present

## 2022-06-01 LAB — CBC WITH DIFFERENTIAL/PLATELET
Abs Immature Granulocytes: 0.04 10*3/uL (ref 0.00–0.07)
Basophils Absolute: 0 10*3/uL (ref 0.0–0.1)
Basophils Relative: 1 %
Eosinophils Absolute: 0.3 10*3/uL (ref 0.0–0.5)
Eosinophils Relative: 8 %
HCT: 45.9 % (ref 39.0–52.0)
Hemoglobin: 15.1 g/dL (ref 13.0–17.0)
Immature Granulocytes: 1 %
Lymphocytes Relative: 19 %
Lymphs Abs: 0.7 10*3/uL (ref 0.7–4.0)
MCH: 32.5 pg (ref 26.0–34.0)
MCHC: 32.9 g/dL (ref 30.0–36.0)
MCV: 98.7 fL (ref 80.0–100.0)
Monocytes Absolute: 0.7 10*3/uL (ref 0.1–1.0)
Monocytes Relative: 17 %
Neutro Abs: 2.1 10*3/uL (ref 1.7–7.7)
Neutrophils Relative %: 54 %
Platelets: 186 10*3/uL (ref 150–400)
RBC: 4.65 MIL/uL (ref 4.22–5.81)
RDW: 14.9 % (ref 11.5–15.5)
WBC: 3.9 10*3/uL — ABNORMAL LOW (ref 4.0–10.5)
nRBC: 0 % (ref 0.0–0.2)

## 2022-06-01 LAB — RESP PANEL BY RT-PCR (RSV, FLU A&B, COVID)  RVPGX2
Influenza A by PCR: NEGATIVE
Influenza B by PCR: NEGATIVE
Resp Syncytial Virus by PCR: NEGATIVE
SARS Coronavirus 2 by RT PCR: NEGATIVE

## 2022-06-01 LAB — COMPREHENSIVE METABOLIC PANEL
ALT: 44 U/L (ref 0–44)
AST: 41 U/L (ref 15–41)
Albumin: 4.1 g/dL (ref 3.5–5.0)
Alkaline Phosphatase: 55 U/L (ref 38–126)
Anion gap: 8 (ref 5–15)
BUN: 15 mg/dL (ref 6–20)
CO2: 24 mmol/L (ref 22–32)
Calcium: 9.4 mg/dL (ref 8.9–10.3)
Chloride: 104 mmol/L (ref 98–111)
Creatinine, Ser: 0.68 mg/dL (ref 0.61–1.24)
GFR, Estimated: >60 mL/min (ref >60)
Glucose, Bld: 99 mg/dL (ref 70–99)
Potassium: 4 mmol/L (ref 3.5–5.1)
Sodium: 136 mmol/L (ref 135–145)
Total Bilirubin: 0.3 mg/dL (ref 0.3–1.2)
Total Protein: 7.4 g/dL (ref 6.5–8.1)

## 2022-06-01 LAB — LIPASE, BLOOD: Lipase: 44 U/L (ref 11–51)

## 2022-06-01 MED ORDER — ONDANSETRON HCL 4 MG/2ML IJ SOLN
4.0000 mg | Freq: Once | INTRAMUSCULAR | Status: AC
  Administered 2022-06-01: 4 mg via INTRAVENOUS
  Filled 2022-06-01: qty 2

## 2022-06-01 MED ORDER — ONDANSETRON HCL 4 MG PO TABS: 4.0000 mg | ORAL_TABLET | Freq: Four times a day (QID) | ORAL | 0 refills | Status: DC

## 2022-06-01 MED ORDER — SODIUM CHLORIDE 0.9 % IV BOLUS
500.0000 mL | Freq: Once | INTRAVENOUS | Status: AC
  Administered 2022-06-01: 500 mL via INTRAVENOUS

## 2022-06-01 MED ORDER — MORPHINE SULFATE (PF) 2 MG/ML IV SOLN
2.0000 mg | Freq: Once | INTRAVENOUS | Status: AC
  Administered 2022-06-01: 2 mg via INTRAVENOUS
  Filled 2022-06-01: qty 1

## 2022-06-01 NOTE — Discharge Instructions (Addendum)
Return for any problem.  ?

## 2022-06-01 NOTE — ED Notes (Signed)
Patient tolerated crackers and ginger ale well denies N/V at this time.

## 2022-06-01 NOTE — ED Notes (Signed)
Patient given crackers and ginger ale to see if he can keep them down due to nausea

## 2022-06-01 NOTE — ED Provider Notes (Signed)
[facility] DEPT Provider Note   CSN: [id number] Arrival date & time: 06/01/22  1558     History  Chief Complaint  Patient presents with   Shortness of Breath   Nausea   Not Feeling Well    Billy Roman is a 61 y.o. male.  61 year old male with prior medical history as detailed below presents for evaluation.  Patient has been taking oral chemotherapy agents (Xeloda).  Patient reports development of nausea and malaise while on his oral chemotherapy.  Patient was advised to hold his oral chemo.  Patient came to the ED today with complaint of nausea.  Patient did report some mild transient dyspnea while his nausea was most extreme.  He denies vomiting.  He denies current shortness of breath. He is reporting diarrhea for the last few days.  He denies abdominal pain, fever, vomiting, other complaint  The history is provided by the patient and medical records.       Home Medications Prior to Admission medications   Medication Sig Start Date End Date Taking? Authorizing Provider  atomoxetine (STRATTERA) 10 MG capsule Take 10 mg by mouth daily.    [provider]  capecitabine (XELODA) 500 MG tablet Take 4 tablets (2,000 mg total) by mouth 2 (two) times daily after a meal. Take Monday-Friday. Take only on days of radiation. 03/27/22   Ladell Pier, MD  dexamethasone (DECADRON) 4 MG tablet Take 4 mg twice daily x 2 days after each chemotherapy treatment starting on day 2 of each cycle Patient not taking: Reported on 05/07/2022 01/17/22   Owens Shark, NP  gabapentin (NEURONTIN) 300 MG capsule Take 100 mg by mouth 2 (two) times daily as needed (neuropathy). 09/27/21   [provider]  ondansetron (ZOFRAN-ODT) 4 MG disintegrating tablet Take 1 tablet (4 mg total) by mouth every 8 (eight) hours as needed for nausea or vomiting. 02/02/22   Evlyn Courier, PA-C  prochlorperazine (COMPAZINE) 10 MG tablet Take 1 tablet (10 mg total) by mouth  every 6 (six) hours as needed for nausea or vomiting. 02/12/22   Ladell Pier, MD  traZODone (DESYREL) 100 MG tablet Take 100 mg by mouth at bedtime. 02/13/22   [provider]  venlafaxine XR (EFFEXOR-XR) 37.5 MG 24 hr capsule Take 37.5 mg by mouth daily. 02/13/22   [provider]      Allergies    Propyphenazone    Review of Systems   Review of Systems  All other systems reviewed and are negative.   Physical Exam Updated Vital Signs Temp 98 F (36.7 C) (Oral)  Physical Exam Vitals and nursing note reviewed.  Constitutional:      General: He is not in acute distress.    Appearance: Normal appearance. He is well-developed.  HENT:     Head: Normocephalic and atraumatic.  Eyes:     Conjunctiva/sclera: Conjunctivae normal.     Pupils: Pupils are equal, round, and reactive to light.  Cardiovascular:     Rate and Rhythm: Normal rate and regular rhythm.     Heart sounds: Normal heart sounds.  Pulmonary:     Effort: Pulmonary effort is normal. No respiratory distress.     Breath sounds: Normal breath sounds.  Abdominal:     General: There is no distension.     Palpations: Abdomen is soft.     Tenderness: There is no abdominal tenderness.  Musculoskeletal:        General: No deformity. Normal range of  motion.     Cervical back: Normal range of motion and neck supple.  Skin:    General: Skin is warm and dry.  Neurological:     General: No focal deficit present.     Mental Status: He is alert and oriented to person, place, and time.     ED Results / Procedures / Treatments   Labs (all labs ordered are listed, but only abnormal results are displayed) Labs Reviewed  CBC WITH DIFFERENTIAL/PLATELET - Abnormal; Notable for the following components:      Result Value   WBC 3.9 (*)    All other components within normal limits  RESP PANEL BY RT-PCR (RSV, FLU A&B, COVID)  RVPGX2  COMPREHENSIVE METABOLIC PANEL  LIPASE, BLOOD  URINALYSIS, ROUTINE W REFLEX  MICROSCOPIC    EKG None  Radiology DG Chest Port 1 View  Result Date: 06/01/2022 CLINICAL DATA:  Shortness of breath with exertion EXAM: PORTABLE CHEST 1 VIEW COMPARISON:  December 17, 2021 FINDINGS: The heart size and mediastinal contours are within normal limits. Both lungs are clear. The visualized skeletal structures are unremarkable. IMPRESSION: No active disease. Electronically Signed   By: Dorise Bullion III M.D.   On: 06/01/2022 17:11    Procedures Procedures    Medications Ordered in ED Medications  sodium chloride 0.9 % bolus 500 mL (has no administration in time range)  ondansetron (ZOFRAN) injection 4 mg (4 mg Intravenous Given 06/01/22 1625)  morphine (PF) 2 MG/ML injection 2 mg (2 mg Intravenous Given 06/01/22 1625)    ED Course/ Medical Decision Making/ A&P                           Medical Decision Making Amount and/or Complexity of Data Reviewed Labs: ordered. Radiology: ordered.  Risk Prescription drug management.    Medical Screen Complete  This patient presented to the ED with complaint of nausea, diarrhea.  This complaint involves an extensive number of treatment options. The initial differential diagnosis includes, but is not limited to, [place] abnormality, etc.  This presentation is: Acute, Chronic, Self-Limited, Previously Undiagnosed, Uncertain Prognosis, Complicated, and Systemic Symptoms  Patient is presenting with complaint of nausea, diarrhea.  This appears to be related to oral chemotherapy.  This stopped his course of oral chemotherapy.  Screening labs obtained are without significant abnormality.  Patient feels significantly improved with IV fluids and antiemetics.  Patient does understand need for close outpatient follow-up.  Strict return precautions given and understood.  At time of discharge patient is taking good p.o.   Additional history obtained:  External records from outside sources obtained and reviewed including prior ED  visits and prior Inpatient records.    Lab Tests:  I ordered and personally interpreted labs.  The pertinent results include: CBC, CMP, lipase, COVID, flu   Cardiac Monitoring:  The patient was maintained on a cardiac monitor.  I personally viewed and interpreted the cardiac monitor which showed an underlying rhythm of: NSR   Medicines ordered:  I ordered medication including IV fluids, Zofran for nausea Reevaluation of the patient after these medicines showed that the patient: improved    Problem List / ED Course:  Nausea   Reevaluation:  After the interventions noted above, I reevaluated the patient and found that they have: improved   Disposition:  After consideration of the diagnostic results and the patients response to treatment, I feel that the patent would benefit from close outpatient follow-up.  Final Clinical Impression(s) / ED Diagnoses Final diagnoses:  Nausea    Rx / DC Orders ED Discharge Orders          Ordered    ondansetron (ZOFRAN) 4 MG tablet  Every 6 hours        06/01/22 1900              Valarie Merino, MD 06/01/22 1904

## 2022-06-01 NOTE — ED Triage Notes (Signed)
BIB EMS last received oral chemo and Radiation on Thursday. Today nausea and shob on exertion. 152/76-84-97% [id number] 104

## 2022-06-03 ENCOUNTER — Other Ambulatory Visit: Payer: Self-pay

## 2022-06-03 ENCOUNTER — Ambulatory Visit
Admission: RE | Admit: 2022-06-03 | Discharge: 2022-06-03 | Disposition: A | Discharge status: Home / Self Care | Payer: BC Managed Care – PPO | Source: Ambulatory Visit | Attending: Radiation Oncology | Admitting: Radiation Oncology

## 2022-06-03 ENCOUNTER — Inpatient Hospital Stay: Payer: BC Managed Care – PPO

## 2022-06-03 ENCOUNTER — Encounter: Payer: Self-pay | Admitting: Radiation Oncology

## 2022-06-03 ENCOUNTER — Encounter: Payer: Self-pay | Admitting: Oncology

## 2022-06-03 DIAGNOSIS — C20 Malignant neoplasm of rectum: Secondary | ICD-10-CM | POA: Diagnosis not present

## 2022-06-03 DIAGNOSIS — Z510 Encounter for antineoplastic radiation therapy: Secondary | ICD-10-CM | POA: Diagnosis not present

## 2022-06-03 LAB — RAD ONC ARIA SESSION SUMMARY
Course Elapsed Days: 42
Plan Fractions Treated to Date: 3
Plan Prescribed Dose Per Fraction: 1.8 Gy
Plan Total Fractions Prescribed: 3
Plan Total Prescribed Dose: 5.4 Gy
Reference Point Dosage Given to Date: 5.4 Gy
Reference Point Session Dosage Given: 1.8 Gy
Session Number: 28

## 2022-06-03 NOTE — Progress Notes (Signed)
[place] and with Dr. Dema Severin in colorectal surgery. He is currently scheduled for robotic LAR on 07/26/22.  Stage III, [id number], moderate to poorly differentiated adenocarcinoma of the distal colon vs proximal rectum. He developed fatigue and skin changes in the treatment field, nausea, and diarrhea, and scant rectal bleeding  during therapy.

## 2022-06-05 ENCOUNTER — Telehealth: Payer: Self-pay | Admitting: *Deleted

## 2022-06-05 NOTE — Telephone Encounter (Signed)
Mr. Emmerich called to report he is still having excess gas and bowel movements soon after he eats and nausea. Also rectal area is still red and burns. Reminded him that he just finished RT/Xeloda on 12/18 and he will continue to have some GI issues for several weeks since the RT/chemo are still working. Suggested Imodium AD up to 8/day and continue nausea med. Try OTC Desitin to skin irritation and use baby wipes. Will improve over time.

## 2022-06-21 ENCOUNTER — Other Ambulatory Visit: Payer: Self-pay | Admitting: Surgery

## 2022-06-21 DIAGNOSIS — C20 Malignant neoplasm of rectum: Secondary | ICD-10-CM

## 2022-06-24 ENCOUNTER — Inpatient Hospital Stay (HOSPITAL_BASED_OUTPATIENT_CLINIC_OR_DEPARTMENT_OTHER): Payer: BC Managed Care – PPO | Admitting: Oncology

## 2022-06-24 ENCOUNTER — Inpatient Hospital Stay: Payer: BC Managed Care – PPO | Attending: Radiation Oncology

## 2022-06-24 ENCOUNTER — Telehealth: Payer: Self-pay

## 2022-06-24 VITALS — BP 146/82 | HR 81 | Temp 98.1°F | Resp 18 | Ht 73.0 in | Wt 276.0 lb

## 2022-06-24 DIAGNOSIS — T451X5D Adverse effect of antineoplastic and immunosuppressive drugs, subsequent encounter: Secondary | ICD-10-CM | POA: Diagnosis not present

## 2022-06-24 DIAGNOSIS — C20 Malignant neoplasm of rectum: Secondary | ICD-10-CM | POA: Insufficient documentation

## 2022-06-24 DIAGNOSIS — Z720 Tobacco use: Secondary | ICD-10-CM | POA: Insufficient documentation

## 2022-06-24 DIAGNOSIS — Z923 Personal history of irradiation: Secondary | ICD-10-CM | POA: Diagnosis not present

## 2022-06-24 DIAGNOSIS — R970 Elevated carcinoembryonic antigen [CEA]: Secondary | ICD-10-CM | POA: Diagnosis not present

## 2022-06-24 DIAGNOSIS — G620 Drug-induced polyneuropathy: Secondary | ICD-10-CM | POA: Insufficient documentation

## 2022-06-24 DIAGNOSIS — Z9221 Personal history of antineoplastic chemotherapy: Secondary | ICD-10-CM | POA: Diagnosis not present

## 2022-06-24 LAB — CBC WITH DIFFERENTIAL (CANCER CENTER ONLY)
Abs Immature Granulocytes: 0.04 10*3/uL (ref 0.00–0.07)
Basophils Absolute: 0.1 10*3/uL (ref 0.0–0.1)
Basophils Relative: 1 %
Eosinophils Absolute: 0.1 10*3/uL (ref 0.0–0.5)
Eosinophils Relative: 2 %
HCT: 44.8 % (ref 39.0–52.0)
Hemoglobin: 15 g/dL (ref 13.0–17.0)
Immature Granulocytes: 1 %
Lymphocytes Relative: 17 %
Lymphs Abs: 0.8 10*3/uL (ref 0.7–4.0)
MCH: 32.7 pg (ref 26.0–34.0)
MCHC: 33.5 g/dL (ref 30.0–36.0)
MCV: 97.6 fL (ref 80.0–100.0)
Monocytes Absolute: 0.6 10*3/uL (ref 0.1–1.0)
Monocytes Relative: 14 %
Neutro Abs: 2.9 10*3/uL (ref 1.7–7.7)
Neutrophils Relative %: 65 %
Platelet Count: 208 10*3/uL (ref 150–400)
RBC: 4.59 MIL/uL (ref 4.22–5.81)
RDW: 14.6 % (ref 11.5–15.5)
WBC Count: 4.5 10*3/uL (ref 4.0–10.5)
nRBC: 0 % (ref 0.0–0.2)

## 2022-06-24 LAB — CMP (CANCER CENTER ONLY)
ALT: 56 U/L — ABNORMAL HIGH (ref 0–44)
AST: 43 U/L — ABNORMAL HIGH (ref 15–41)
Albumin: 4.3 g/dL (ref 3.5–5.0)
Alkaline Phosphatase: 54 U/L (ref 38–126)
Anion gap: 9 (ref 5–15)
BUN: 16 mg/dL (ref 6–20)
CO2: 28 mmol/L (ref 22–32)
Calcium: 9.9 mg/dL (ref 8.9–10.3)
Chloride: 104 mmol/L (ref 98–111)
Creatinine: 0.7 mg/dL (ref 0.61–1.24)
GFR, Estimated: >60 mL/min (ref >60)
Glucose, Bld: 97 mg/dL (ref 70–99)
Potassium: 4.4 mmol/L (ref 3.5–5.1)
Sodium: 141 mmol/L (ref 135–145)
Total Bilirubin: 0.5 mg/dL (ref 0.3–1.2)
Total Protein: 7.2 g/dL (ref 6.5–8.1)

## 2022-06-24 NOTE — Telephone Encounter (Signed)
CSW attempted to contact patient via phone per his request from nurse.  His vm was full and CSW could not leave a message.  Will attempt to contact again.

## 2022-06-24 NOTE — Progress Notes (Signed)
  [place] OFFICE PROGRESS NOTE   Diagnosis: Rectal cancer  INTERVAL HISTORY:   Billy Roman completed concurrent Xeloda and radiation on 06/03/2022.  He reports intermittent neuropathy symptoms in the fingers.  He has chronic neuropathy in the feet.  The feet symptoms predated chemotherapy.  He has persistent skin changes at the fingers.  He has experienced improvement in diarrhea and rectal pain since beginning treatment.  He saw Dr. Dema Severin and is scheduled for surgery on 07/26/2022.  Objective:  Vital signs in last 24 hours:  Blood pressure (!) 146/82, pulse 81, temperature 98.1 F (36.7 C), temperature source Oral, resp. rate 18, height '6\' 1"'$  (1.854 m), weight 276 lb (125.2 kg), SpO2 98 %.    HEENT: No thrush or ulcers Lymphatics: No cervical, supraclavicular, axillary, or inguinal nodes Resp: Lungs clear bilaterally Cardio: Regular rate and rhythm GI: No hepatosplenomegaly, nontender, no mass Vascular: No leg edema  Skin: Mild hyperpigmentation of the hands   Lab Results:  Lab Results  Component Value Date   WBC 4.5 06/24/2022   HGB 15.0 06/24/2022   HCT 44.8 06/24/2022   MCV 97.6 06/24/2022   PLT 208 06/24/2022   NEUTROABS 2.9 06/24/2022    CMP  Lab Results  Component Value Date   NA 141 06/24/2022   K 4.4 06/24/2022   CL 104 06/24/2022   CO2 28 06/24/2022   GLUCOSE 97 06/24/2022   BUN 16 06/24/2022   CREATININE 0.70 06/24/2022   CALCIUM 9.9 06/24/2022   PROT 7.2 06/24/2022   ALBUMIN 4.3 06/24/2022   AST 43 (H) 06/24/2022   ALT 56 (H) 06/24/2022   ALKPHOS 54 06/24/2022   BILITOT 0.5 06/24/2022   GFRNONAA >60 06/24/2022   GFRAA 117 07/26/2019    Lab Results  Component Value Date   CEA 5.94 (H) 05/24/2022     Medications: I have reviewed the patient's current medications.   Assessment/Plan: Adenocarcinoma of the proximal rectum/distal sigmoid Colonoscopy 11/19/2021-partially obstructing mass at 12-19 cm, biopsy moderate to poorly  differentiated adenocarcinoma Mildly elevated CEA CTs 11/29/2021-mural thickening in the distal sigmoid/proximal rectum with haziness of the mesorectal fat, mesorectal lymphadenopathy, small pulmonary nodules favored to be benign MRI pelvis 12/03/2021-tumor at 10 cm from the anal verge, T3c, approximately 10 metastatic nodes in the mesorectal sheath and presacral space, no extra mesorectal lymphadenopathy, N2 Cycle 1 FOLFOX 12/19/2021 Cycle 2 FOLFOX 01/02/2022, home Decadron prophylaxis added for delayed nausea Cycle 3 FOLFOX 01/15/2022 Cycle 4 FOLFOX 01/29/2022, 5-FU bolus, leucovorin, and oxaliplatin dose reduced secondary to neutropenia and thrombocytopenia Cycle 5 FOLFOX 02/12/2022, Udenyca Cycle 6 FOLFOX 02/26/2022, Udenyca Cycle 7 FOLFOX 03/12/2022, Udenyca  Cycle 8 FOLFOX 03/26/2022, oxaliplatin held due to neuropathy, Udenyca held Radiation/Xeloda 04/22/2022-06/03/2022 2.   Major depressive disorder Change in bowel habits and rectal bleeding secondary to #1 Ongoing tobacco use Report of blurred vision and diplopia Peripheral neuropathy-fingers and left foot    Disposition: Mr. Chock has completed the course of total neoadjuvant therapy.  He is scheduled for rectal surgery on 07/26/2022.  He will undergo restaging CTs of the chest and abdomen/pelvis over the next 1-2 weeks.  We will see him a few weeks after surgery to review the pathology report and plan oncology follow-up.  I encouraged him to discontinue smoking.  Billy Coder, MD  06/24/2022  2:58 PM

## 2022-06-25 ENCOUNTER — Inpatient Hospital Stay: Payer: BC Managed Care – PPO | Admitting: Licensed Clinical Social Worker

## 2022-06-25 ENCOUNTER — Other Ambulatory Visit: Payer: Self-pay

## 2022-06-25 ENCOUNTER — Telehealth: Payer: Self-pay

## 2022-06-25 NOTE — Progress Notes (Signed)
[place] CSW Progress Note  Clinical Education officer, museum returned patient's call.  He stated his daughter forgot to make his COBRA insurance payment in December and the company is cancelling his health insurance.  He has contacted a lawyer who is assisting him in appealing the decision.  Hendrik requested a letter from Dr. Benay Spice addressing his forgetfulness.  CSW consulting MD and will follow up with patient.    Margaree Mackintosh, LCSW  Clinical Social Worker [facility]

## 2022-06-25 NOTE — Telephone Encounter (Signed)
Call back placed to pt as requested. Pt states that he also left a VM for Regenia Skeeter, with social work regarding the same request. Pt is requesting a letter of appeal from [place] [employer] office for [employer]. This RN informed pt that will follow-up with social work regarding his request. Pt verbalizes understanding.

## 2022-06-26 ENCOUNTER — Telehealth: Payer: Self-pay | Admitting: *Deleted

## 2022-06-26 ENCOUNTER — Inpatient Hospital Stay: Payer: BC Managed Care – PPO | Admitting: Licensed Clinical Social Worker

## 2022-06-26 ENCOUNTER — Other Ambulatory Visit: Payer: Self-pay | Admitting: *Deleted

## 2022-06-26 ENCOUNTER — Encounter: Payer: Self-pay | Admitting: *Deleted

## 2022-06-26 NOTE — Telephone Encounter (Signed)
Billy Roman called to request a office visit or telephone visit with Dr. Benay Spice to discuss his cognitive issues and ability to manage his finances in regards to missing payment on his COBRA plan and it's cancellation. He tells the RN that [place] documents cognitive impairment in cancer patients going through treatment.  He has also placed a call to his past mental health provider that evaluated him for this issue as well as having an attorney working on this as well.

## 2022-06-26 NOTE — Progress Notes (Signed)
[place] CSW Progress Note  Clinical Social Worker contacted patient by phone to discuss health insurance issue.  Patient was informed he should contact his mental health provider regarding his request for documentation regarding his cognitive issues for health insurance reasons.  He stated he understood.    Margaree Mackintosh, LCSW  Clinical Social Worker [facility]

## 2022-06-26 NOTE — Telephone Encounter (Signed)
Patient called with concerns regarding his cognitive function and a decline that has caused him to miss a payment with his insurance and now faces cancellation of his policy.  He is requesting a letter from our office to help his case to get his insurance policy reinstated.  He states he has reached out to Dr. Gearldine Shown office to see if he could discuss this with him and is awaiting a return call.  He states according to the [facility], cognitive impairment is common in cancer patient's undergoing chemotherapy and radiation therapy.  He was advised that with the type of radiation he received it was unlikely to cause any cognitive changes.  This RN let him know that I would inform Dr. Lisbeth Renshaw of his requests and would give him a call once I have more information.  He verbalized understanding of this.  Gloriajean Dell. Leonie Green, BSN

## 2022-06-27 ENCOUNTER — Encounter: Payer: Self-pay | Admitting: Radiation Oncology

## 2022-06-27 ENCOUNTER — Other Ambulatory Visit: Payer: BC Managed Care – PPO

## 2022-06-27 ENCOUNTER — Telehealth: Payer: Self-pay | Admitting: *Deleted

## 2022-06-27 ENCOUNTER — Ambulatory Visit: Payer: BC Managed Care – PPO | Admitting: Oncology

## 2022-06-27 NOTE — Telephone Encounter (Signed)
Pt called need a letter regarding his cognitive function to take to his insurance company. Please call [phone].

## 2022-06-28 ENCOUNTER — Encounter: Payer: Self-pay | Admitting: *Deleted

## 2022-07-01 ENCOUNTER — Inpatient Hospital Stay: Payer: BC Managed Care – PPO | Attending: Radiation Oncology

## 2022-07-01 NOTE — Progress Notes (Signed)
[place] CSW Progress Note  Clinical Education officer, museum returned patient's call.  He wished to provide an update regarding his possible COBRA cancellation.  He has expressed increased anxiety.  CSW provided active listening and supportive counseling.  Margaree Mackintosh, LCSW  Clinical Social Worker [facility]

## 2022-07-03 NOTE — Telephone Encounter (Signed)
Letter completed in communications tab.  Patient needs to be given the information for his neuropsychological evaluation where it was sent, he needs to schedule this.  This information is imperative for how his memory loss is handled and treated.

## 2022-07-03 NOTE — Telephone Encounter (Signed)
I have reviewed the example letter and talked with Hilda Blades.  Pt is requesting a letter speaking to his cognitive function. He missed a COBRA payment in December and his policy was terminated. He is trying to appeal this and his laywer has recommended a letter from neurology. He also reached out to oncology on this but letter declined by oncologist.

## 2022-07-03 NOTE — Telephone Encounter (Signed)
  Please call and update pt letter is ready for pickup. He also needs to call [facility] and schedule his neuropsychology evaluation.   Letter placed up front at [place].

## 2022-07-04 NOTE — Telephone Encounter (Signed)
Called pt. Informed him of message from [place]. Stated he needed a new referral sent because [facility] is out of network with his insurance.Stated he would drive up and get letter today from our office. Pt said thank you for calling me.

## 2022-07-04 NOTE — Telephone Encounter (Signed)
Noted, thank you

## 2022-07-08 ENCOUNTER — Encounter (HOSPITAL_COMMUNITY): Payer: BC Managed Care – PPO

## 2022-07-08 ENCOUNTER — Ambulatory Visit: Payer: Self-pay | Admitting: Surgery

## 2022-07-08 DIAGNOSIS — Z01818 Encounter for other preprocedural examination: Secondary | ICD-10-CM

## 2022-07-08 DIAGNOSIS — R739 Hyperglycemia, unspecified: Secondary | ICD-10-CM

## 2022-07-08 NOTE — Progress Notes (Signed)
Sent message, via epic in basket, requesting orders in epic from surgeon.  

## 2022-07-09 ENCOUNTER — Encounter: Payer: Self-pay | Admitting: *Deleted

## 2022-07-13 ENCOUNTER — Encounter: Payer: Self-pay | Admitting: Oncology

## 2022-07-13 NOTE — Progress Notes (Signed)
COVID Vaccine received:  []  No []  Yes Date of any COVID positive Test in last 90 days:  PCP - Durene Fruits, NP Cardiologist -  Oncology- [name], MD completed concurrent Xeloda and radiation on 06/03/2022.   Chest x-ray - 06-01-2022  1v  epic EKG -  09-19-2021  epic Stress Test -  ECHO - 10-01-2021  epic Cardiac Cath -   PCR screen: []  Ordered & Completed                      [x]   No Order but Needs PROFEND                      []   N/A for this surgery  Surgery Plan:  []  Ambulatory                            []  Outpatient in bed                            [x]  Admit  Anesthesia:    [x]  General  []  Spinal                           []   Choice []   MAC  Bowel Prep - []  No  []   Yes _____________  Pacemaker / ICD device [x]  No []  Yes        Device order form faxed [x]  No    []   Yes      Faxed to:  Spinal Cord Stimulator:[x]  No []  Yes      (Remind patient to bring remote DOS) Other Implants:   History of Sleep Apnea? [x]  No []  Yes   CPAP used?- [x]  No []  Yes    Does the patient monitor blood sugar? []  No []  Yes  [x]  N/A  Blood Thinner / Instructions:  none Aspirin Instructions:  none  ERAS Protocol Ordered: []  No  [x]  Yes PRE-SURGERY [x]  ENSURE x3  []  G2  Patient is to be NPO after: 04:30  am  Comments:   Activity level: Patient can / can not climb a flight of stairs without difficulty; []  No CP  []  No SOB, but would have ______   Patient can / can not perform ADLs without assistance.   Anesthesia review: PONV, Smoker, Tremors- hands, competed concurrent Radiation and chemotherapy 06-03-22  Patient denies shortness of breath, fever, cough and chest pain at PAT appointment.  Patient verbalized understanding and agreement to the Pre-Surgical Instructions that were given to them at this PAT appointment. Patient was also educated of the need to review these PAT instructions again prior to his/her surgery.I reviewed the appropriate phone numbers to call if they have  any and questions or concerns.

## 2022-07-13 NOTE — Patient Instructions (Addendum)
SURGICAL WAITING ROOM VISITATION Patients having surgery or a procedure may have no more than 2 support people in the waiting area - these visitors may rotate in the visitor waiting room.   Due to an increase in RSV and influenza rates and associated hospitalizations, children ages 82 and under may not visit patients in [place]. If the patient needs to stay at the hospital during part of their recovery, the visitor guidelines for inpatient rooms apply.  PRE-OP VISITATION  Pre-op nurse will coordinate an appropriate time for 1 support person to accompany the patient in pre-op.  This support person may not rotate.  This visitor will be contacted when the time is appropriate for the visitor to come back in the pre-op area.  Please refer to the [facility] website for the visitor guidelines for Inpatients (after your surgery is over and you are in a regular room).  You are not required to quarantine at this time prior to your surgery. However, you must do this: Hand Hygiene often Do NOT share personal items Notify your provider if you are in close contact with someone who has COVID or you develop fever 100.4 or greater, new onset of sneezing, cough, sore throat, shortness of breath or body aches.  If you test positive for Covid or have been in contact with anyone that has tested positive in the last 10 days please notify you surgeon.    Your procedure is scheduled on:  Friday  July 26, 2022  Report to [facility] Main Entrance: [place] entrance where the [employer] is available.   Report to admitting at: 05:15 AM  +++++Call this number if you have any questions or problems the morning of surgery [phone]  Do not eat food after Midnight the night prior to your surgery/procedure.  After Midnight you may have the following liquids until 04:30 AM DAY OF SURGERY  Clear Liquid Diet Water Black Coffee (sugar ok, NO MILK/CREAM OR CREAMERS)  Tea (sugar ok, NO  MILK/CREAM OR CREAMERS) regular and decaf                             Plain Jell-O  with no fruit (NO RED)                                           Fruit ices (not with fruit pulp, NO RED)                                     Popsicles (NO RED)                                                                  Juice: apple, WHITE grape, WHITE cranberry Sports drinks like Gatorade or Powerade (NO RED)         DRINK two (2) bottles of Pre-Surgery Clear Ensure starting at 6:00 pm the evening prior to your surgery to help prevent dehydration. Increase drinking clear fluids (see list above)  The day of surgery:  Drink ONE (1) Pre-Surgery Clear Ensure or G2 at        AM the morning of surgery. Drink in one sitting. Do not sip.  This drink was given to you during your hospital pre-op appointment visit. Nothing else to drink after completing the Pre-Surgery Clear Ensure or G2 : No candy, chewing gum or throat lozenges.    FOLLOW BOWEL PREP AND ANY ADDITIONAL PRE OP INSTRUCTIONS YOU RECEIVED FROM YOUR SURGEON'S OFFICE!!!  Dulcolax 5 mg - Take Four (4) tablets (20 mg total) with water at 07:00 am the day prior to surgery.  Miralax 255 g - Mix with 64 oz Gatorade/Powerade.  Starting at 10:00 am ,Drink this gradually over the next few hours (8 oz glass every 15-30 minutes) until gone the day prior to surgery You should finish in 4 hours-6 hours.    Neomycin 1000 mg - At 2 pm, 3 pm and 10 pm after Miralax  bowel prep the day prior to surgery.  Metronidazole 1000 mg - At 2 pm, 3 pm and 10 pm after Miralax bowel prep the day prior to surgery.   Drink plenty of clear liquids all evening to avoid getting dehydrated.     Oral Hygiene is also important to reduce your risk of infection.        Remember - BRUSH YOUR TEETH THE MORNING OF SURGERY WITH YOUR REGULAR TOOTHPASTE  Do NOT smoke after Midnight the night before surgery.  Take ONLY these medicines the morning of surgery with A SIP OF  WATER: none                    You may not have any metal on your body including  jewelry, and body piercing  Do not wear  lotions, powders, cologne, or deodorant  Men may shave face and neck.  Contacts, Hearing Aids, dentures or bridgework may not be worn into surgery. DENTURES WILL BE REMOVED PRIOR TO SURGERY PLEASE DO NOT APPLY "Poly grip" OR ADHESIVES!!!  You may bring a small overnight bag with you on the day of surgery, only pack items that are not valuable. [facility] IS NOT RESPONSIBLE   FOR VALUABLES THAT ARE LOST OR STOLEN.   Do not bring your home medications to the hospital. The Pharmacy will dispense medications listed on your medication list to you during your admission in the Hospital.  Special Instructions: Bring a copy of your healthcare power of attorney and living will documents the day of surgery, if you wish to have them scanned into your  Medical Records- EPIC  Please read over the following fact sheets you were given: IF YOU HAVE QUESTIONS ABOUT YOUR PRE-OP INSTRUCTIONS, PLEASE CALL [phone]  ([place])   [facility] - Preparing for Surgery Before surgery, you can play an important role.  Because skin is not sterile, your skin needs to be as free of germs as possible.  You can reduce the number of germs on your skin by washing with CHG (chlorahexidine gluconate) soap before surgery.  CHG is an antiseptic cleaner which kills germs and bonds with the skin to continue killing germs even after washing. Please DO NOT use if you have an allergy to CHG or antibacterial soaps.  If your skin becomes reddened/irritated stop using the CHG and inform your nurse when you arrive at Short Stay. Do not shave (including legs and underarms) for at least 48 hours prior to the first CHG shower.  You may  shave your face/neck.  Please follow these instructions carefully:  1.  Shower with CHG Soap the night before surgery and the  morning of surgery.  2.  If you choose to wash  your hair, wash your hair first as usual with your normal  shampoo.  3.  After you shampoo, rinse your hair and body thoroughly to remove the shampoo.                             4.  Use CHG as you would any other liquid soap.  You can apply chg directly to the skin and wash.  Gently with a scrungie or clean washcloth.  5.  Apply the CHG Soap to your body ONLY FROM THE NECK DOWN.   Do not use on face/ open                           Wound or open sores. Avoid contact with eyes, ears mouth and genitals (private parts).                       Wash face,  Genitals (private parts) with your normal soap.             6.  Wash thoroughly, paying special attention to the area where your  surgery  will be performed.  7.  Thoroughly rinse your body with warm water from the neck down.  8.  DO NOT shower/wash with your normal soap after using and rinsing off the CHG Soap.            9.  Pat yourself dry with a clean towel.            10.  Wear clean pajamas.            11.  Place clean sheets on your bed the night of your first shower and do not  sleep with pets.  ON THE DAY OF SURGERY : Do not apply any lotions/deodorants the morning of surgery.  Please wear clean clothes to the hospital/surgery center.    FAILURE TO FOLLOW THESE INSTRUCTIONS MAY RESULT IN THE CANCELLATION OF YOUR SURGERY  PATIENT SIGNATURE_________________________________  NURSE SIGNATURE__________________________________  ________________________________________________________________________        Adam Phenix    An incentive spirometer is a tool that can help keep your lungs clear and active. This tool measures how well you are filling your lungs with each breath. Taking long deep breaths may help reverse or decrease the chance of developing breathing (pulmonary) problems (especially infection) following: A long period of time when you are unable to move or be active. BEFORE THE PROCEDURE  If the spirometer  includes an indicator to show your best effort, your nurse or respiratory therapist will set it to a desired goal. If possible, sit up straight or lean slightly forward. Try not to slouch. Hold the incentive spirometer in an upright position. INSTRUCTIONS FOR USE  Sit on the edge of your bed if possible, or sit up as far as you can in bed or on a chair. Hold the incentive spirometer in an upright position. Breathe out normally. Place the mouthpiece in your mouth and seal your lips tightly around it. Breathe in slowly and as deeply as possible, raising the piston or the ball toward the top of the column. Hold your breath for 3-5 seconds or for as long as  possible. Allow the piston or ball to fall to the bottom of the column. Remove the mouthpiece from your mouth and breathe out normally. Rest for a few seconds and repeat Steps 1 through 7 at least 10 times every 1-2 hours when you are awake. Take your time and take a few normal breaths between deep breaths. The spirometer may include an indicator to show your best effort. Use the indicator as a goal to work toward during each repetition. After each set of 10 deep breaths, practice coughing to be sure your lungs are clear. If you have an incision (the cut made at the time of surgery), support your incision when coughing by placing a pillow or rolled up towels firmly against it. Once you are able to get out of bed, walk around indoors and cough well. You may stop using the incentive spirometer when instructed by your caregiver.  RISKS AND COMPLICATIONS Take your time so you do not get dizzy or light-headed. If you are in pain, you may need to take or ask for pain medication before doing incentive spirometry. It is harder to take a deep breath if you are having pain. AFTER USE Rest and breathe slowly and easily. It can be helpful to keep track of a log of your progress. Your caregiver can provide you with a simple table to help with this. If you are  using the spirometer at home, follow these instructions: [place] IF:  You are having difficultly using the spirometer. You have trouble using the spirometer as often as instructed. Your pain medication is not giving enough relief while using the spirometer. You develop fever of 100.5 F (38.1 C) or higher.                                                                                                    SEEK IMMEDIATE MEDICAL CARE IF:  You cough up bloody sputum that had not been present before. You develop fever of 102 F (38.9 C) or greater. You develop worsening pain at or near the incision site. MAKE SURE YOU:  Understand these instructions. Will watch your condition. Will get help right away if you are not doing well or get worse. Document Released: 10/14/2006 Document Revised: 08/26/2011 Document Reviewed: 12/15/2006 [facility] Patient Information 2014 [place], [place].    WHAT IS A BLOOD TRANSFUSION? Blood Transfusion Information  A transfusion is the replacement of blood or some of its parts. Blood is made up of multiple cells which provide different functions. Red blood cells carry oxygen and are used for blood loss replacement. White blood cells fight against infection. Platelets control bleeding. Plasma helps clot blood. Other blood products are available for specialized needs, such as hemophilia or other clotting disorders. BEFORE THE TRANSFUSION  Who gives blood for transfusions?  Healthy volunteers who are fully evaluated to make sure their blood is safe. This is blood bank blood. Transfusion therapy is the safest it has ever been in the practice of medicine. Before blood is taken from a donor, a complete history is taken to make sure that person  has no history of diseases nor engages in risky social behavior (examples are intravenous drug use or sexual activity with multiple partners). The donor's travel history is screened to minimize risk of transmitting  infections, such as malaria. The donated blood is tested for signs of infectious diseases, such as HIV and hepatitis. The blood is then tested to be sure it is compatible with you in order to minimize the chance of a transfusion reaction. If you or a relative donates blood, this is often done in anticipation of surgery and is not appropriate for emergency situations. It takes many days to process the donated blood. RISKS AND COMPLICATIONS Although transfusion therapy is very safe and saves many lives, the main dangers of transfusion include:  Getting an infectious disease. Developing a transfusion reaction. This is an allergic reaction to something in the blood you were given. Every precaution is taken to prevent this. The decision to have a blood transfusion has been considered carefully by your caregiver before blood is given. Blood is not given unless the benefits outweigh the risks. AFTER THE TRANSFUSION Right after receiving a blood transfusion, you will usually feel much better and more energetic. This is especially true if your red blood cells have gotten low (anemic). The transfusion raises the level of the red blood cells which carry oxygen, and this usually causes an energy increase. The nurse administering the transfusion will monitor you carefully for complications. HOME CARE INSTRUCTIONS  No special instructions are needed after a transfusion. You may find your energy is better. Speak with your caregiver about any limitations on activity for underlying diseases you may have. SEEK MEDICAL CARE IF:  Your condition is not improving after your transfusion. You develop redness or irritation at the intravenous (IV) site. SEEK IMMEDIATE MEDICAL CARE IF:  Any of the following symptoms occur over the next 12 hours: Shaking chills. You have a temperature by mouth above 102 F (38.9 C), not controlled by medicine. Chest, back, or muscle pain. People around you feel you are not acting correctly  or are confused. Shortness of breath or difficulty breathing. Dizziness and fainting. You get a rash or develop hives. You have a decrease in urine output. Your urine turns a dark color or changes to pink, red, or brown. Any of the following symptoms occur over the next 10 days: You have a temperature by mouth above 102 F (38.9 C), not controlled by medicine. Shortness of breath. Weakness after normal activity. The white part of the eye turns yellow (jaundice). You have a decrease in the amount of urine or are urinating less often. Your urine turns a dark color or changes to pink, red, or brown. Document Released: 05/31/2000 Document Revised: 08/26/2011 Document Reviewed: 01/18/2008 [facility] Patient Information 2014 [place], [place].  _______________________________________________________________________

## 2022-07-15 ENCOUNTER — Ambulatory Visit
Admission: RE | Admit: 2022-07-15 | Discharge: 2022-07-15 | Disposition: A | Discharge status: Home / Self Care | Payer: BC Managed Care – PPO | Source: Ambulatory Visit | Attending: Radiation Oncology | Admitting: Radiation Oncology

## 2022-07-15 ENCOUNTER — Encounter: Payer: Self-pay | Admitting: Oncology

## 2022-07-15 ENCOUNTER — Encounter (HOSPITAL_COMMUNITY)
Admission: RE | Admit: 2022-07-15 | Discharge: 2022-07-15 | Disposition: A | Discharge status: Home / Self Care | Payer: BC Managed Care – PPO | Source: Ambulatory Visit | Attending: Anesthesiology | Admitting: Anesthesiology

## 2022-07-15 ENCOUNTER — Ambulatory Visit: Payer: Self-pay | Admitting: Surgery

## 2022-07-15 DIAGNOSIS — Z9221 Personal history of antineoplastic chemotherapy: Secondary | ICD-10-CM

## 2022-07-15 NOTE — Consult Note (Incomplete)
[facility] Nurse requested for preoperative stoma site marking  Discussed surgical procedure and stoma creation with patient and family.  Explained role of the [facility] nurse team.  Provided the patient with educational booklet and provided samples of pouching options.  Answered patient and family questions.   Examined patient lying, sitting, and standing in order to place the marking in the patient's visual field, away from any creases or abdominal contour issues and within the rectus muscle.  Attempted to mark below the patient's belt line.   Marked for colostomy in the LLQ  ____ cm to the left of the umbilicus and ____cm above/below the umbilicus.  Marked for ileostomy in the RLQ  ____cm to the right of the umbilicus and  ____ cm above/below the umbilicus.   Patient's abdomen cleansed with CHG wipes at site markings, allowed to air dry prior to marking.Covered mark with thin film transparent dressing to preserve mark until date of surgery.   [facility] Nurse team will follow up with patient after surgery for continue ostomy care and teaching.  [place] MSN, [place], [place], [place]

## 2022-07-15 NOTE — Progress Notes (Signed)
  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  Intent: Curative  Radiation Treatment Dates: 04/22/2022 through 06/03/2022 Site Technique Total Dose (Gy) Dose per Fx (Gy) Completed Fx Beam Energies  Rectum: Rectum 3D 45/45 1.8 25/25 10X, 15X  Rectum: Rectum_Bst 3D 5.4/5.4 1.8 3/3 10X, 15X   (as documented in provider EOT note)   The patient was available for call today.   Symptoms of fatigue have improved mild since completing therapy.  Symptoms of skin changes have improved since completing therapy.   Symptoms of bladder changes have improved since completing therapy.  Symptoms of bowel changes have improved since completing therapy.  The patient is not scheduled for follow up or surgery with colorectal surgeon Dr. Dema Severin  The patient has scheduled follow up with his medical oncologist Dr. Benay Spice and their surgeon Dr. Dema Severin for ongoing surveillance. he  was encouraged to call if he  develop concerns or questions regarding radiation.  This concludes the interview.   Leandra Kern, LPN

## 2022-07-17 ENCOUNTER — Ambulatory Visit: Payer: Self-pay | Admitting: Neurology

## 2022-07-26 ENCOUNTER — Inpatient Hospital Stay: Admit: 2022-07-26 | Payer: BC Managed Care – PPO | Admitting: Surgery

## 2022-07-26 SURGERY — RESECTION, RECTUM, LOW ANTERIOR, ROBOT-ASSISTED
Anesthesia: General

## 2022-07-29 ENCOUNTER — Inpatient Hospital Stay: Payer: Self-pay | Attending: Radiation Oncology

## 2022-07-29 NOTE — Progress Notes (Signed)
[facility] CSW Progress Note  Clinical Education officer, museum returned patient's phone call.  He said his COBRA appeal was denied and that he was instructed to appeal again.  Patient inquired about obtaining his medical records.  Per his request, CSW emailed him the information at [email or web address].  CSW provided the information on the [facility] website.  Also provided patient with the contact information to [employer] to assist with medical bills.  He expressed no other needs at this time.    Billy Roman Caroleena Paolini, LCSW

## 2022-08-05 ENCOUNTER — Inpatient Hospital Stay: Payer: Self-pay

## 2022-08-05 NOTE — Progress Notes (Signed)
[facility] CSW Progress Note  Clinical Education officer, museum returned patients call.  He has sent a request to medical records for specific dates to appeal his denial of COBRA.  He has contacted [employer] to obtain insurance, but it will not be effective until March 1st.  He has delayed his surgery and CT scan due to lack of insurance.  He is not eligible for the [employer] because of his income.  He expressed being very frustrated.  CSW provided active listening and supportive counseling.    Rodman Pickle Mykiah Schmuck, LCSW

## 2022-08-06 ENCOUNTER — Other Ambulatory Visit: Payer: Self-pay

## 2022-08-07 ENCOUNTER — Other Ambulatory Visit: Payer: Self-pay

## 2022-08-09 ENCOUNTER — Inpatient Hospital Stay: Payer: Self-pay

## 2022-08-09 NOTE — Progress Notes (Signed)
[facility] CSW Progress Note  Clinical Education officer, museum returned patient's call.  He stated he still has not received a call back from medical records ([facility]).  CSW verified phone number that patient is to call.  CSW to email patient a ROI to help expedite the process.  He continues to express his frustration with his COBRA being denied.    Rodman Pickle Damia Bobrowski, LCSW

## 2022-08-13 ENCOUNTER — Inpatient Hospital Stay: Payer: 59 | Admitting: Oncology

## 2022-08-16 ENCOUNTER — Encounter: Payer: Self-pay | Admitting: Oncology

## 2022-08-19 ENCOUNTER — Encounter: Payer: Self-pay | Admitting: Oncology

## 2022-08-20 NOTE — Progress Notes (Signed)
Surgery orders requested via Epic inbox. °

## 2022-08-21 ENCOUNTER — Ambulatory Visit: Payer: Self-pay | Admitting: Surgery

## 2022-08-21 DIAGNOSIS — Z01818 Encounter for other preprocedural examination: Secondary | ICD-10-CM

## 2022-08-23 ENCOUNTER — Inpatient Hospital Stay: Payer: 59

## 2022-08-23 ENCOUNTER — Inpatient Hospital Stay: Payer: 59 | Attending: Radiation Oncology | Admitting: Oncology

## 2022-08-23 VITALS — BP 124/78 | HR 86 | Temp 98.1°F | Resp 18 | Ht 73.0 in | Wt 280.0 lb

## 2022-08-23 DIAGNOSIS — Z9221 Personal history of antineoplastic chemotherapy: Secondary | ICD-10-CM | POA: Insufficient documentation

## 2022-08-23 DIAGNOSIS — Z85048 Personal history of other malignant neoplasm of rectum, rectosigmoid junction, and anus: Secondary | ICD-10-CM | POA: Diagnosis present

## 2022-08-23 DIAGNOSIS — Z923 Personal history of irradiation: Secondary | ICD-10-CM | POA: Diagnosis not present

## 2022-08-23 DIAGNOSIS — C20 Malignant neoplasm of rectum: Secondary | ICD-10-CM | POA: Diagnosis not present

## 2022-08-23 DIAGNOSIS — F329 Major depressive disorder, single episode, unspecified: Secondary | ICD-10-CM | POA: Insufficient documentation

## 2022-08-23 LAB — BASIC METABOLIC PANEL - CANCER CENTER ONLY
Anion gap: 7 (ref 5–15)
BUN: 17 mg/dL (ref 6–20)
CO2: 27 mmol/L (ref 22–32)
Calcium: 9.7 mg/dL (ref 8.9–10.3)
Chloride: 104 mmol/L (ref 98–111)
Creatinine: 0.73 mg/dL (ref 0.61–1.24)
GFR, Estimated: >60 mL/min (ref >60)
Glucose, Bld: 95 mg/dL (ref 70–99)
Potassium: 4.2 mmol/L (ref 3.5–5.1)
Sodium: 138 mmol/L (ref 135–145)

## 2022-08-23 LAB — CEA (ACCESS): CEA (CHCC): 2.54 ng/mL (ref 0.00–5.00)

## 2022-08-23 NOTE — Progress Notes (Signed)
  [place] OFFICE PROGRESS NOTE   Diagnosis: Rectal cancer  INTERVAL HISTORY:   Billy Roman underwent Port-A-Cath removal 05/20/2022.  He was scheduled for restaging CTs and surgery, but canceled these appointments due to loss of insurance.  He has obtained insurance coverage and is scheduled for rectal surgery 09/04/2022.  He was evaluated by Dr. Dema Severin in January.  Billy Roman reports mild improvement in neuropathy symptoms.  He continues to have irregular bowel habits and abdominal bloating.  No bleeding.  Objective:  Vital signs in last 24 hours:  Blood pressure 124/78, pulse 86, temperature 98.1 F (36.7 C), resp. rate 18, height 6\' 1"  (1.854 m), weight 280 lb (127 kg), SpO2 98 %.    Lymphatics: No cervical, supraclavicular, axillary, or inguinal nodes Resp: Breath sounds, no respiratory distress Cardio: Regular rate and rhythm GI: No mass, no hepatosplenomegaly, nontender Vascular: No leg edema  Skin: Callus formation at the palms   Lab Results:  Lab Results  Component Value Date   WBC 4.5 06/24/2022   HGB 15.0 06/24/2022   HCT 44.8 06/24/2022   MCV 97.6 06/24/2022   PLT 208 06/24/2022   NEUTROABS 2.9 06/24/2022    CMP  Lab Results  Component Value Date   NA 141 06/24/2022   K 4.4 06/24/2022   CL 104 06/24/2022   CO2 28 06/24/2022   GLUCOSE 97 06/24/2022   BUN 16 06/24/2022   CREATININE 0.70 06/24/2022   CALCIUM 9.9 06/24/2022   PROT 7.2 06/24/2022   ALBUMIN 4.3 06/24/2022   AST 43 (H) 06/24/2022   ALT 56 (H) 06/24/2022   ALKPHOS 54 06/24/2022   BILITOT 0.5 06/24/2022   GFRNONAA >60 06/24/2022   GFRAA 117 07/26/2019    Lab Results  Component Value Date   CEA 5.94 (H) 05/24/2022    Medications: I have reviewed the patient's current medications.   Assessment/Plan: Adenocarcinoma of the proximal rectum/distal sigmoid Colonoscopy 11/19/2021-partially obstructing mass at 12-19 cm, biopsy moderate to poorly differentiated  adenocarcinoma Mildly elevated CEA CTs 11/29/2021-mural thickening in the distal sigmoid/proximal rectum with haziness of the mesorectal fat, mesorectal lymphadenopathy, small pulmonary nodules favored to be benign MRI pelvis 12/03/2021-tumor at 10 cm from the anal verge, T3c, approximately 10 metastatic nodes in the mesorectal sheath and presacral space, no extra mesorectal lymphadenopathy, N2 Cycle 1 FOLFOX 12/19/2021 Cycle 2 FOLFOX 01/02/2022, home Decadron prophylaxis added for delayed nausea Cycle 3 FOLFOX 01/15/2022 Cycle 4 FOLFOX 01/29/2022, 5-FU bolus, leucovorin, and oxaliplatin dose reduced secondary to neutropenia and thrombocytopenia Cycle 5 FOLFOX 02/12/2022, Udenyca Cycle 6 FOLFOX 02/26/2022, Udenyca Cycle 7 FOLFOX 03/12/2022, Udenyca  Cycle 8 FOLFOX 03/26/2022, oxaliplatin held due to neuropathy, Udenyca held Radiation/Xeloda 04/22/2022-06/03/2022 2.   Major depressive disorder Change in bowel habits and rectal bleeding secondary to #1 Ongoing tobacco use Report of blurred vision and diplopia Peripheral neuropathy-fingers and left foot     Disposition: Billy Roman has a history of clinical stage III rectal cancer.  He completed a course of total neoadjuvant therapy on 06/03/2022.  He did not undergo planned restaging CTs or surgery due to loss of insurance coverage.  He now has insurance and is scheduled for surgery on 09/04/2022.  We will refer him for restaging CTs and I will present his case at the GI tumor conference. He will return for an office visit after surgery.  He understands the plan for a low anterior resection and diverting loop ileostomy.  Billy Coder, MD  08/23/2022  10:59 AM

## 2022-08-26 NOTE — Progress Notes (Signed)
COVID Vaccine Completed:  Date of COVID positive in last 90 days:  PCP - Durene Fruits, NP Cardiologist - Shelva Majestic, MD LOV 09/19/21 Oncologist- [name], MD  Chest x-ray - 06/01/22 Epic EKG - 09/19/21 Epic Stress Test -  ECHO - 10/01/21 Epic Cardiac Cath -  Pacemaker/ICD device last checked: Spinal Cord Stimulator:  Bowel Prep -   Sleep Study -  CPAP -   Fasting Blood Sugar -  Checks Blood Sugar _____ times a day  Last dose of GLP1 agonist-  N/A GLP1 instructions:  N/A   Last dose of SGLT-2 inhibitors-  N/A SGLT-2 instructions: N/A   Blood Thinner Instructions: Aspirin Instructions: Last Dose:  Activity level:  Can go up a flight of stairs and perform activities of daily living without stopping and without symptoms of chest pain or shortness of breath.  Able to exercise without symptoms  Unable to go up a flight of stairs without symptoms of     Anesthesia review: OSA, RBB, 1st degee AV block, aortic atherosclerosis, chemo?  Patient denies shortness of breath, fever, cough and chest pain at PAT appointment  Patient verbalized understanding of instructions that were given to them at the PAT appointment. Patient was also instructed that they will need to review over the PAT instructions again at home before surgery.

## 2022-08-26 NOTE — Patient Instructions (Signed)
SURGICAL WAITING ROOM VISITATION  Patients having surgery or a procedure may have no more than 2 support people in the waiting area - these visitors may rotate.    Children under the age of 33 must have an adult with them who is not the patient.  Due to an increase in RSV and influenza rates and associated hospitalizations, children ages 82 and under may not visit patients in [place].  If the patient needs to stay at the hospital during part of their recovery, the visitor guidelines for inpatient rooms apply. Pre-op nurse will coordinate an appropriate time for 1 support person to accompany patient in pre-op.  This support person may not rotate.    Please refer to the [facility] website for the visitor guidelines for Inpatients (after your surgery is over and you are in a regular room).    Your procedure is scheduled on: 09/04/22   Report to [facility] Main Entrance    Report to admitting at 6:15 AM   Call this number if you have problems the morning of surgery [phone]   Follow a clear liquid diet the day before surgery.   You may have the following liquids until 5:30 AM DAY OF SURGERY  Water Non-Citrus Juices (without pulp, NO RED-Apple, White grape, White cranberry) Black Coffee (NO MILK/CREAM OR CREAMERS, sugar ok)  Clear Tea (NO MILK/CREAM OR CREAMERS, sugar ok) regular and decaf                             Plain Jell-O (NO RED)                                           Fruit ices (not with fruit pulp, NO RED)                                     Popsicles (NO RED)                                                               Sports drinks like Gatorade (NO RED)              Drink 2 Ensure drinks AT 10:00 PM the night before surgery.        The day of surgery:  Drink ONE (1) Pre-Surgery Clear Ensure at 5:30 AM the morning of surgery. Drink in one sitting. Do not sip.  This drink was given to you during your hospital  pre-op appointment  visit. Nothing else to drink after completing the  Pre-Surgery Clear Ensure.          If you have questions, please contact your surgeon's office.   FOLLOW BOWEL PREP AND ANY ADDITIONAL PRE OP INSTRUCTIONS YOU RECEIVED FROM YOUR SURGEON'S OFFICE!!!     Oral Hygiene is also important to reduce your risk of infection.  Remember - BRUSH YOUR TEETH THE MORNING OF SURGERY WITH YOUR REGULAR TOOTHPASTE  DENTURES WILL BE REMOVED PRIOR TO SURGERY PLEASE DO NOT APPLY "Poly grip" OR ADHESIVES!!!   Do NOT smoke after Midnight   Take these medicines the morning of surgery with A SIP OF WATER: Gabapentin, Compazine   DO NOT TAKE ANY ORAL DIABETIC MEDICATIONS DAY OF YOUR SURGERY  Bring CPAP mask and tubing day of surgery.                              You may not have any metal on your body including hair pins, jewelry, and body piercing             Do not wear lotions, powders, cologne, or deodorant  Do not shave  48 hours prior to surgery.               Men may shave face and neck.   Do not bring valuables to the hospital. [place].   Contacts, glasses, dentures or bridgework may not be worn into surgery.   Bring small overnight bag day of surgery.   DO NOT [place]. PHARMACY WILL DISPENSE MEDICATIONS LISTED ON YOUR MEDICATION LIST TO YOU DURING YOUR ADMISSION [place]!   Special Instructions: Bring a copy of your healthcare power of attorney and living will documents the day of surgery if you haven't scanned them before.              Please read over the following fact sheets you were given: IF [place] [phone] Schneiders    If you received a COVID test during your pre-op visit  it is requested that you wear a mask when out in public, stay away from anyone that may not be feeling well and notify your  surgeon if you develop symptoms. If you test positive for Covid or have been in contact with anyone that has tested positive in the last 10 days please notify you surgeon.    [place] - Preparing for Surgery Before surgery, you can play an important role.  Because skin is not sterile, your skin needs to be as free of germs as possible.  You can reduce the number of germs on your skin by washing with CHG (chlorahexidine gluconate) soap before surgery.  CHG is an antiseptic cleaner which kills germs and bonds with the skin to continue killing germs even after washing. Please DO NOT use if you have an allergy to CHG or antibacterial soaps.  If your skin becomes reddened/irritated stop using the CHG and inform your nurse when you arrive at Short Stay. Do not shave (including legs and underarms) for at least 48 hours prior to the first CHG shower.  You may shave your face/neck.  Please follow these instructions carefully:  1.  Shower with CHG Soap the night before surgery and the  morning of surgery.  2.  If you choose to wash your hair, wash your hair first as usual with your normal  shampoo.  3.  After you shampoo, rinse your hair and body thoroughly to remove the shampoo.  4.  Use CHG as you would any other liquid soap.  You can apply chg directly to the skin and wash.  Gently with a scrungie or clean washcloth.  5.  Apply the CHG Soap to your body ONLY FROM THE NECK DOWN.   Do   not use on face/ open                           Wound or open sores. Avoid contact with eyes, ears mouth and   genitals (private parts).                       Wash face,  Genitals (private parts) with your normal soap.             6.  Wash thoroughly, paying special attention to the area where your    surgery  will be performed.  7.  Thoroughly rinse your body with warm water from the neck down.  8.  DO NOT shower/wash with your normal soap after using and rinsing off the CHG Soap.                 9.  Pat yourself dry with a clean towel.            10.  Wear clean pajamas.            11.  Place clean sheets on your bed the night of your first shower and do not  sleep with pets. Day of Surgery : Do not apply any lotions/deodorants the morning of surgery.  Please wear clean clothes to the hospital/surgery center.  FAILURE TO FOLLOW THESE INSTRUCTIONS MAY RESULT IN THE CANCELLATION OF YOUR SURGERY  PATIENT SIGNATURE_________________________________  NURSE SIGNATURE__________________________________  ________________________________________________________________________  Adam Phenix  An incentive spirometer is a tool that can help keep your lungs clear and active. This tool measures how well you are filling your lungs with each breath. Taking long deep breaths may help reverse or decrease the chance of developing breathing (pulmonary) problems (especially infection) following: A long period of time when you are unable to move or be active. BEFORE THE PROCEDURE  If the spirometer includes an indicator to show your best effort, your nurse or respiratory therapist will set it to a desired goal. If possible, sit up straight or lean slightly forward. Try not to slouch. Hold the incentive spirometer in an upright position. INSTRUCTIONS FOR USE  Sit on the edge of your bed if possible, or sit up as far as you can in bed or on a chair. Hold the incentive spirometer in an upright position. Breathe out normally. Place the mouthpiece in your mouth and seal your lips tightly around it. Breathe in slowly and as deeply as possible, raising the piston or the ball toward the top of the column. Hold your breath for 3-5 seconds or for as long as possible. Allow the piston or ball to fall to the bottom of the column. Remove the mouthpiece from your mouth and breathe out normally. Rest for a few seconds and repeat Steps 1 through 7 at least 10 times every 1-2 hours when you are awake. Take  your time and take a few normal breaths between deep breaths. The spirometer may include an indicator to show your best effort. Use the indicator as a goal to work toward during each repetition. After each set of 10 deep breaths, practice coughing to be sure your  lungs are clear. If you have an incision (the cut made at the time of surgery), support your incision when coughing by placing a pillow or rolled up towels firmly against it. Once you are able to get out of bed, walk around indoors and cough well. You may stop using the incentive spirometer when instructed by your caregiver.  RISKS AND COMPLICATIONS Take your time so you do not get dizzy or light-headed. If you are in pain, you may need to take or ask for pain medication before doing incentive spirometry. It is harder to take a deep breath if you are having pain. AFTER USE Rest and breathe slowly and easily. It can be helpful to keep track of a log of your progress. Your caregiver can provide you with a simple table to help with this. If you are using the spirometer at home, follow these instructions: [place] IF:  You are having difficultly using the spirometer. You have trouble using the spirometer as often as instructed. Your pain medication is not giving enough relief while using the spirometer. You develop fever of 100.5 F (38.1 C) or higher. SEEK IMMEDIATE MEDICAL CARE IF:  You cough up bloody sputum that had not been present before. You develop fever of 102 F (38.9 C) or greater. You develop worsening pain at or near the incision site. MAKE SURE YOU:  Understand these instructions. Will watch your condition. Will get help right away if you are not doing well or get worse. Document Released: 10/14/2006 Document Revised: 08/26/2011 Document Reviewed: 12/15/2006 Exit[employer] Patient Information 2014 [employer], [place].   ________________________________________________________________________ WHAT IS A BLOOD  TRANSFUSION? Blood Transfusion Information  A transfusion is the replacement of blood or some of its parts. Blood is made up of multiple cells which provide different functions. Red blood cells carry oxygen and are used for blood loss replacement. White blood cells fight against infection. Platelets control bleeding. Plasma helps clot blood. Other blood products are available for specialized needs, such as hemophilia or other clotting disorders. BEFORE THE TRANSFUSION  Who gives blood for transfusions?  Healthy volunteers who are fully evaluated to make sure their blood is safe. This is blood bank blood. Transfusion therapy is the safest it has ever been in the practice of medicine. Before blood is taken from a donor, a complete history is taken to make sure that person has no history of diseases nor engages in risky social behavior (examples are intravenous drug use or sexual activity with multiple partners). The donor's travel history is screened to minimize risk of transmitting infections, such as malaria. The donated blood is tested for signs of infectious diseases, such as HIV and hepatitis. The blood is then tested to be sure it is compatible with you in order to minimize the chance of a transfusion reaction. If you or a relative donates blood, this is often done in anticipation of surgery and is not appropriate for emergency situations. It takes many days to process the donated blood. RISKS AND COMPLICATIONS Although transfusion therapy is very safe and saves many lives, the main dangers of transfusion include:  Getting an infectious disease. Developing a transfusion reaction. This is an allergic reaction to something in the blood you were given. Every precaution is taken to prevent this. The decision to have a blood transfusion has been considered carefully by your caregiver before blood is given. Blood is not given unless the benefits outweigh the risks. AFTER THE TRANSFUSION Right after  receiving a blood transfusion, you  will usually feel much better and more energetic. This is especially true if your red blood cells have gotten low (anemic). The transfusion raises the level of the red blood cells which carry oxygen, and this usually causes an energy increase. The nurse administering the transfusion will monitor you carefully for complications. HOME CARE INSTRUCTIONS  No special instructions are needed after a transfusion. You may find your energy is better. Speak with your caregiver about any limitations on activity for underlying diseases you may have. SEEK MEDICAL CARE IF:  Your condition is not improving after your transfusion. You develop redness or irritation at the intravenous (IV) site. SEEK IMMEDIATE MEDICAL CARE IF:  Any of the following symptoms occur over the next 12 hours: Shaking chills. You have a temperature by mouth above 102 F (38.9 C), not controlled by medicine. Chest, back, or muscle pain. People around you feel you are not acting correctly or are confused. Shortness of breath or difficulty breathing. Dizziness and fainting. You get a rash or develop hives. You have a decrease in urine output. Your urine turns a dark color or changes to pink, red, or brown. Any of the following symptoms occur over the next 10 days: You have a temperature by mouth above 102 F (38.9 C), not controlled by medicine. Shortness of breath. Weakness after normal activity. The white part of the eye turns yellow (jaundice). You have a decrease in the amount of urine or are urinating less often. Your urine turns a dark color or changes to pink, red, or brown. Document Released: 05/31/2000 Document Revised: 08/26/2011 Document Reviewed: 01/18/2008 [facility] Patient Information 2014 [place], [place].  _______________________________________________________________________

## 2022-08-27 ENCOUNTER — Other Ambulatory Visit: Payer: Self-pay

## 2022-08-27 ENCOUNTER — Encounter (HOSPITAL_COMMUNITY): Payer: Self-pay

## 2022-08-27 ENCOUNTER — Encounter (HOSPITAL_COMMUNITY)
Admission: RE | Admit: 2022-08-27 | Discharge: 2022-08-27 | Disposition: A | Discharge status: Home / Self Care | Payer: 59 | Source: Ambulatory Visit | Attending: Surgery | Admitting: Surgery

## 2022-08-27 DIAGNOSIS — Z01818 Encounter for other preprocedural examination: Secondary | ICD-10-CM | POA: Diagnosis not present

## 2022-08-27 HISTORY — DX: Unspecified osteoarthritis, unspecified site: M19.90

## 2022-08-27 HISTORY — DX: Cardiac arrhythmia, unspecified: I49.9

## 2022-08-27 LAB — COMPREHENSIVE METABOLIC PANEL
ALT: 32 U/L (ref 0–44)
AST: 32 U/L (ref 15–41)
Albumin: 4.1 g/dL (ref 3.5–5.0)
Alkaline Phosphatase: 55 U/L (ref 38–126)
Anion gap: 6 (ref 5–15)
BUN: 15 mg/dL (ref 6–20)
CO2: 24 mmol/L (ref 22–32)
Calcium: 8.9 mg/dL (ref 8.9–10.3)
Chloride: 106 mmol/L (ref 98–111)
Creatinine, Ser: 0.61 mg/dL (ref 0.61–1.24)
GFR, Estimated: >60 mL/min (ref >60)
Glucose, Bld: 101 mg/dL — ABNORMAL HIGH (ref 70–99)
Potassium: 3.7 mmol/L (ref 3.5–5.1)
Sodium: 136 mmol/L (ref 135–145)
Total Bilirubin: 0.6 mg/dL (ref 0.3–1.2)
Total Protein: 7.4 g/dL (ref 6.5–8.1)

## 2022-08-27 LAB — CBC WITH DIFFERENTIAL/PLATELET
Abs Immature Granulocytes: 0.02 10*3/uL (ref 0.00–0.07)
Basophils Absolute: 0.1 10*3/uL (ref 0.0–0.1)
Basophils Relative: 1 %
Eosinophils Absolute: 0.1 10*3/uL (ref 0.0–0.5)
Eosinophils Relative: 3 %
HCT: 43.6 % (ref 39.0–52.0)
Hemoglobin: 14.7 g/dL (ref 13.0–17.0)
Immature Granulocytes: 1 %
Lymphocytes Relative: 21 %
Lymphs Abs: 0.7 10*3/uL (ref 0.7–4.0)
MCH: 31.7 pg (ref 26.0–34.0)
MCHC: 33.7 g/dL (ref 30.0–36.0)
MCV: 94.2 fL (ref 80.0–100.0)
Monocytes Absolute: 0.5 10*3/uL (ref 0.1–1.0)
Monocytes Relative: 14 %
Neutro Abs: 2.1 10*3/uL (ref 1.7–7.7)
Neutrophils Relative %: 60 %
Platelets: 214 10*3/uL (ref 150–400)
RBC: 4.63 MIL/uL (ref 4.22–5.81)
RDW: 12.3 % (ref 11.5–15.5)
WBC: 3.5 10*3/uL — ABNORMAL LOW (ref 4.0–10.5)
nRBC: 0 % (ref 0.0–0.2)

## 2022-08-27 LAB — PROTIME-INR
INR: 1 (ref 0.8–1.2)
Prothrombin Time: 13.4 seconds (ref 11.4–15.2)

## 2022-08-27 NOTE — Consult Note (Addendum)
[place] Nurse requested for preoperative stoma site marking  Discussed surgical procedure and stoma creation with patient.  Explained role of the [place] nurse team. Provided the patient with educational booklet and provided samples of pouching options.  Answered patient's questions.   Examined patient sitting, and standing in order to place the marking in the patient's visual field, away from any creases or abdominal contour issues and within the rectus muscle.  Attempted to mark below the patient's belt line, but this was not possible, since a significant crease occurs lower on the abd when the patient leans forward which should be avoided if possible.    Marked for colostomy in the LLQ  __7__ cm to the left of the umbilicus and [id number] above the umbilicus.  Marked for ileostomy in the RLQ  _7___cm to the right of the umbilicus and  [id number] cm above the umbilicus.  Patient's abdomen cleansed with CHG wipes at site markings, allowed to air dry prior to marking. Covered mark with thin film transparent dressing to preserve mark until date of surgery.   [facility] Nurse team will follow up with patient after surgery for continued ostomy care and teaching.  Thank-you,  Julien Girt MSN, [place], [place], [place], [place]

## 2022-08-28 ENCOUNTER — Encounter (HOSPITAL_BASED_OUTPATIENT_CLINIC_OR_DEPARTMENT_OTHER): Payer: Self-pay

## 2022-08-28 ENCOUNTER — Ambulatory Visit (HOSPITAL_BASED_OUTPATIENT_CLINIC_OR_DEPARTMENT_OTHER)
Admission: RE | Admit: 2022-08-28 | Discharge: 2022-08-28 | Disposition: A | Discharge status: Home / Self Care | Payer: 59 | Source: Ambulatory Visit | Attending: Surgery | Admitting: Surgery

## 2022-08-28 DIAGNOSIS — C20 Malignant neoplasm of rectum: Secondary | ICD-10-CM

## 2022-08-28 DIAGNOSIS — N281 Cyst of kidney, acquired: Secondary | ICD-10-CM | POA: Diagnosis not present

## 2022-08-28 LAB — HEMOGLOBIN A1C
Hgb A1c MFr Bld: 5.6 % (ref 4.8–5.6)
Mean Plasma Glucose: 114 mg/dL

## 2022-08-28 MED ORDER — IOHEXOL 300 MG/ML  SOLN
100.0000 mL | Freq: Once | INTRAMUSCULAR | Status: AC | PRN
  Administered 2022-08-28: 100 mL via INTRAVENOUS

## 2022-09-04 ENCOUNTER — Other Ambulatory Visit: Payer: Self-pay

## 2022-09-04 ENCOUNTER — Inpatient Hospital Stay (HOSPITAL_COMMUNITY): Payer: 59 | Admitting: Physician Assistant

## 2022-09-04 ENCOUNTER — Inpatient Hospital Stay (HOSPITAL_COMMUNITY)
Admission: RE | Admit: 2022-09-04 | Discharge: 2022-09-11 | DRG: 331 | Disposition: A | Discharge status: Home Health | Payer: 59 | Attending: Surgery | Admitting: Surgery

## 2022-09-04 ENCOUNTER — Encounter (HOSPITAL_COMMUNITY)
Admission: RE | Disposition: A | Discharge status: Home Health | Payer: Self-pay | Source: Home / Self Care | Attending: Surgery

## 2022-09-04 ENCOUNTER — Encounter (HOSPITAL_COMMUNITY): Payer: Self-pay | Admitting: Surgery

## 2022-09-04 ENCOUNTER — Inpatient Hospital Stay (HOSPITAL_COMMUNITY): Payer: 59 | Admitting: Anesthesiology

## 2022-09-04 DIAGNOSIS — Z932 Ileostomy status: Secondary | ICD-10-CM

## 2022-09-04 DIAGNOSIS — R918 Other nonspecific abnormal finding of lung field: Secondary | ICD-10-CM | POA: Diagnosis not present

## 2022-09-04 DIAGNOSIS — Z91041 Radiographic dye allergy status: Secondary | ICD-10-CM | POA: Diagnosis not present

## 2022-09-04 DIAGNOSIS — C20 Malignant neoplasm of rectum: Principal | ICD-10-CM

## 2022-09-04 DIAGNOSIS — K4090 Unilateral inguinal hernia, without obstruction or gangrene, not specified as recurrent: Secondary | ICD-10-CM | POA: Diagnosis present

## 2022-09-04 DIAGNOSIS — M6281 Muscle weakness (generalized): Secondary | ICD-10-CM | POA: Diagnosis not present

## 2022-09-04 DIAGNOSIS — G4700 Insomnia, unspecified: Secondary | ICD-10-CM | POA: Diagnosis not present

## 2022-09-04 DIAGNOSIS — G8929 Other chronic pain: Secondary | ICD-10-CM | POA: Diagnosis present

## 2022-09-04 DIAGNOSIS — F17210 Nicotine dependence, cigarettes, uncomplicated: Secondary | ICD-10-CM | POA: Diagnosis present

## 2022-09-04 DIAGNOSIS — G4730 Sleep apnea, unspecified: Secondary | ICD-10-CM | POA: Diagnosis not present

## 2022-09-04 DIAGNOSIS — F419 Anxiety disorder, unspecified: Secondary | ICD-10-CM | POA: Diagnosis present

## 2022-09-04 DIAGNOSIS — F32A Depression, unspecified: Secondary | ICD-10-CM | POA: Diagnosis not present

## 2022-09-04 DIAGNOSIS — F17200 Nicotine dependence, unspecified, uncomplicated: Secondary | ICD-10-CM | POA: Diagnosis not present

## 2022-09-04 DIAGNOSIS — Z888 Allergy status to other drugs, medicaments and biological substances status: Secondary | ICD-10-CM

## 2022-09-04 DIAGNOSIS — Z432 Encounter for attention to ileostomy: Secondary | ICD-10-CM

## 2022-09-04 DIAGNOSIS — R413 Other amnesia: Secondary | ICD-10-CM | POA: Diagnosis present

## 2022-09-04 DIAGNOSIS — F332 Major depressive disorder, recurrent severe without psychotic features: Secondary | ICD-10-CM | POA: Diagnosis present

## 2022-09-04 DIAGNOSIS — Z9889 Other specified postprocedural states: Secondary | ICD-10-CM

## 2022-09-04 HISTORY — PX: XI ROBOTIC ASSISTED LOWER ANTERIOR RESECTION: SHX6558

## 2022-09-04 HISTORY — PX: DIVERTING ILEOSTOMY: SHX5799

## 2022-09-04 HISTORY — PX: FLEXIBLE SIGMOIDOSCOPY: SHX5431

## 2022-09-04 LAB — ABO/RH: ABO/RH(D): A POS

## 2022-09-04 LAB — TYPE AND SCREEN
ABO/RH(D): A POS
Antibody Screen: NEGATIVE

## 2022-09-04 SURGERY — RESECTION, RECTUM, LOW ANTERIOR, ROBOT-ASSISTED
Anesthesia: General

## 2022-09-04 MED ORDER — ONDANSETRON HCL 4 MG/2ML IJ SOLN
4.0000 mg | Freq: Four times a day (QID) | INTRAMUSCULAR | Status: DC | PRN
  Administered 2022-09-04 – 2022-09-10 (×2): 4 mg via INTRAVENOUS
  Filled 2022-09-04 (×2): qty 2

## 2022-09-04 MED ORDER — FENTANYL CITRATE (PF) 250 MCG/5ML IJ SOLN
INTRAMUSCULAR | Status: DC | PRN
  Administered 2022-09-04 (×3): 50 ug via INTRAVENOUS
  Administered 2022-09-04: 100 ug via INTRAVENOUS

## 2022-09-04 MED ORDER — TRAZODONE HCL 100 MG PO TABS
100.0000 mg | ORAL_TABLET | Freq: Every evening | ORAL | Status: DC | PRN
  Administered 2022-09-05 – 2022-09-09 (×3): 100 mg via ORAL
  Filled 2022-09-04 (×3): qty 1

## 2022-09-04 MED ORDER — ROCURONIUM BROMIDE 10 MG/ML (PF) SYRINGE
PREFILLED_SYRINGE | INTRAVENOUS | Status: AC
  Filled 2022-09-04: qty 10

## 2022-09-04 MED ORDER — ALVIMOPAN 12 MG PO CAPS
12.0000 mg | ORAL_CAPSULE | ORAL | Status: AC
  Administered 2022-09-04: 12 mg via ORAL
  Filled 2022-09-04: qty 1

## 2022-09-04 MED ORDER — METRONIDAZOLE 500 MG PO TABS: 1000.0000 mg | ORAL_TABLET | ORAL | Status: DC

## 2022-09-04 MED ORDER — DEXAMETHASONE SODIUM PHOSPHATE 10 MG/ML IJ SOLN
INTRAMUSCULAR | Status: AC
  Filled 2022-09-04: qty 1

## 2022-09-04 MED ORDER — SODIUM CHLORIDE 0.9% FLUSH
3.0000 mL | Freq: Two times a day (BID) | INTRAVENOUS | Status: DC
  Administered 2022-09-04: 3 mL via INTRAVENOUS

## 2022-09-04 MED ORDER — CALCIUM POLYCARBOPHIL 625 MG PO TABS
625.0000 mg | ORAL_TABLET | Freq: Two times a day (BID) | ORAL | Status: DC
  Administered 2022-09-04 – 2022-09-11 (×13): 625 mg via ORAL
  Filled 2022-09-04 (×14): qty 1

## 2022-09-04 MED ORDER — MENTHOL 3 MG MT LOZG: 1.0000 | LOZENGE | OROMUCOSAL | Status: DC | PRN

## 2022-09-04 MED ORDER — BISACODYL 5 MG PO TBEC: 20.0000 mg | DELAYED_RELEASE_TABLET | Freq: Once | ORAL | Status: DC

## 2022-09-04 MED ORDER — ONDANSETRON HCL 4 MG/2ML IJ SOLN
INTRAMUSCULAR | Status: DC | PRN
  Administered 2022-09-04: 4 mg via INTRAVENOUS

## 2022-09-04 MED ORDER — PHENOL 1.4 % MT LIQD: 2.0000 | OROMUCOSAL | Status: DC | PRN

## 2022-09-04 MED ORDER — TRAMADOL HCL 50 MG PO TABS
50.0000 mg | ORAL_TABLET | Freq: Four times a day (QID) | ORAL | Status: DC | PRN
  Administered 2022-09-04 – 2022-09-05 (×2): 50 mg via ORAL
  Filled 2022-09-04 (×3): qty 1

## 2022-09-04 MED ORDER — ACETAMINOPHEN 500 MG PO TABS
1000.0000 mg | ORAL_TABLET | ORAL | Status: AC
  Administered 2022-09-04: 1000 mg via ORAL
  Filled 2022-09-04: qty 2

## 2022-09-04 MED ORDER — OXYCODONE HCL 5 MG PO TABS: 5.0000 mg | ORAL_TABLET | Freq: Once | ORAL | Status: DC | PRN

## 2022-09-04 MED ORDER — LACTATED RINGERS IV SOLN: INTRAVENOUS | Status: DC

## 2022-09-04 MED ORDER — ONDANSETRON HCL 4 MG PO TABS
4.0000 mg | ORAL_TABLET | Freq: Four times a day (QID) | ORAL | Status: DC | PRN
  Administered 2022-09-11: 4 mg via ORAL
  Filled 2022-09-04: qty 1

## 2022-09-04 MED ORDER — ONDANSETRON HCL 4 MG/2ML IJ SOLN: 4.0000 mg | Freq: Once | INTRAMUSCULAR | Status: DC | PRN

## 2022-09-04 MED ORDER — CHLORHEXIDINE GLUCONATE CLOTH 2 % EX PADS: 6.0000 | MEDICATED_PAD | Freq: Once | CUTANEOUS | Status: DC

## 2022-09-04 MED ORDER — LIDOCAINE 2% (20 MG/ML) 5 ML SYRINGE
INTRAMUSCULAR | Status: DC | PRN
  Administered 2022-09-04: 100 mg via INTRAVENOUS

## 2022-09-04 MED ORDER — ACETAMINOPHEN 500 MG PO TABS
1000.0000 mg | ORAL_TABLET | Freq: Four times a day (QID) | ORAL | Status: DC
  Administered 2022-09-04 – 2022-09-11 (×26): 1000 mg via ORAL
  Filled 2022-09-04 (×25): qty 2

## 2022-09-04 MED ORDER — POLYETHYLENE GLYCOL 3350 17 GM/SCOOP PO POWD: 1.0000 | Freq: Once | ORAL | Status: DC

## 2022-09-04 MED ORDER — HYDRALAZINE HCL 20 MG/ML IJ SOLN: 10.0000 mg | INTRAMUSCULAR | Status: DC | PRN

## 2022-09-04 MED ORDER — MIDAZOLAM HCL 2 MG/2ML IJ SOLN
INTRAMUSCULAR | Status: AC
  Filled 2022-09-04: qty 2

## 2022-09-04 MED ORDER — SODIUM CHLORIDE 0.9% FLUSH: 3.0000 mL | INTRAVENOUS | Status: DC | PRN

## 2022-09-04 MED ORDER — CHLORHEXIDINE GLUCONATE 0.12 % MT SOLN
15.0000 mL | Freq: Once | OROMUCOSAL | Status: AC
  Administered 2022-09-04: 15 mL via OROMUCOSAL

## 2022-09-04 MED ORDER — HEPARIN SODIUM (PORCINE) 5000 UNIT/ML IJ SOLN
5000.0000 [IU] | Freq: Three times a day (TID) | INTRAMUSCULAR | Status: DC
  Administered 2022-09-04 – 2022-09-11 (×20): 5000 [IU] via SUBCUTANEOUS
  Filled 2022-09-04 (×21): qty 1

## 2022-09-04 MED ORDER — LIP MEDEX EX OINT
TOPICAL_OINTMENT | Freq: Two times a day (BID) | CUTANEOUS | Status: DC
  Administered 2022-09-04 – 2022-09-05 (×3): 75 via TOPICAL
  Administered 2022-09-08 – 2022-09-09 (×2): 1 via TOPICAL
  Administered 2022-09-10: 75 via TOPICAL
  Filled 2022-09-04 (×4): qty 7

## 2022-09-04 MED ORDER — SODIUM CHLORIDE 0.9 % IV SOLN
2.0000 g | INTRAVENOUS | Status: AC
  Administered 2022-09-04: 2 g via INTRAVENOUS
  Filled 2022-09-04: qty 2

## 2022-09-04 MED ORDER — ENSURE PRE-SURGERY PO LIQD: 592.0000 mL | Freq: Once | ORAL | Status: DC

## 2022-09-04 MED ORDER — ALUM & MAG HYDROXIDE-SIMETH 200-200-20 MG/5ML PO SUSP: 30.0000 mL | Freq: Four times a day (QID) | ORAL | Status: DC | PRN

## 2022-09-04 MED ORDER — BUPIVACAINE-EPINEPHRINE (PF) 0.25% -1:200000 IJ SOLN
INTRAMUSCULAR | Status: DC | PRN
  Administered 2022-09-04: 50 mL

## 2022-09-04 MED ORDER — SIMETHICONE 80 MG PO CHEW: 80.0000 mg | CHEWABLE_TABLET | Freq: Four times a day (QID) | ORAL | Status: DC | PRN

## 2022-09-04 MED ORDER — PROCHLORPERAZINE MALEATE 10 MG PO TABS: 10.0000 mg | ORAL_TABLET | Freq: Four times a day (QID) | ORAL | Status: DC | PRN

## 2022-09-04 MED ORDER — PROPOFOL 10 MG/ML IV BOLUS
INTRAVENOUS | Status: AC
  Filled 2022-09-04: qty 20

## 2022-09-04 MED ORDER — ENSURE SURGERY PO LIQD
237.0000 mL | Freq: Two times a day (BID) | ORAL | Status: DC
  Administered 2022-09-05 – 2022-09-10 (×6): 237 mL via ORAL
  Filled 2022-09-04 (×2): qty 237

## 2022-09-04 MED ORDER — KETAMINE HCL 10 MG/ML IJ SOLN
INTRAMUSCULAR | Status: AC
  Filled 2022-09-04: qty 1

## 2022-09-04 MED ORDER — SODIUM CHLORIDE 0.9 % IV SOLN: 250.0000 mL | INTRAVENOUS | Status: DC | PRN

## 2022-09-04 MED ORDER — 0.9 % SODIUM CHLORIDE (POUR BTL) OPTIME
TOPICAL | Status: DC | PRN
  Administered 2022-09-04: 1000 mL

## 2022-09-04 MED ORDER — HYDROMORPHONE HCL 1 MG/ML IJ SOLN
INTRAMUSCULAR | Status: AC
  Filled 2022-09-04: qty 1

## 2022-09-04 MED ORDER — PROCHLORPERAZINE EDISYLATE 10 MG/2ML IJ SOLN: 5.0000 mg | INTRAMUSCULAR | Status: DC | PRN

## 2022-09-04 MED ORDER — HYDROMORPHONE HCL 1 MG/ML IJ SOLN
0.5000 mg | INTRAMUSCULAR | Status: DC | PRN
  Administered 2022-09-04 – 2022-09-06 (×8): 0.5 mg via INTRAVENOUS
  Filled 2022-09-04 (×8): qty 0.5

## 2022-09-04 MED ORDER — OXYCODONE HCL 5 MG/5ML PO SOLN: 5.0000 mg | Freq: Once | ORAL | Status: DC | PRN

## 2022-09-04 MED ORDER — ENSURE PRE-SURGERY PO LIQD: 296.0000 mL | Freq: Once | ORAL | Status: DC

## 2022-09-04 MED ORDER — FENTANYL CITRATE (PF) 250 MCG/5ML IJ SOLN
INTRAMUSCULAR | Status: AC
  Filled 2022-09-04: qty 5

## 2022-09-04 MED ORDER — ALVIMOPAN 12 MG PO CAPS
12.0000 mg | ORAL_CAPSULE | Freq: Two times a day (BID) | ORAL | Status: DC
  Administered 2022-09-05: 12 mg via ORAL
  Filled 2022-09-04 (×2): qty 1

## 2022-09-04 MED ORDER — KETAMINE HCL 10 MG/ML IJ SOLN
INTRAMUSCULAR | Status: DC | PRN
  Administered 2022-09-04 (×5): 10 mg via INTRAVENOUS

## 2022-09-04 MED ORDER — HEPARIN SODIUM (PORCINE) 5000 UNIT/ML IJ SOLN
5000.0000 [IU] | Freq: Once | INTRAMUSCULAR | Status: AC
  Administered 2022-09-04: 5000 [IU] via SUBCUTANEOUS
  Filled 2022-09-04: qty 1

## 2022-09-04 MED ORDER — SUGAMMADEX SODIUM 200 MG/2ML IV SOLN
INTRAVENOUS | Status: DC | PRN
  Administered 2022-09-04: 250 mg via INTRAVENOUS

## 2022-09-04 MED ORDER — STERILE WATER FOR IRRIGATION IR SOLN
Status: DC | PRN
  Administered 2022-09-04: 500 mL

## 2022-09-04 MED ORDER — DIPHENHYDRAMINE HCL 50 MG/ML IJ SOLN: 12.5000 mg | Freq: Four times a day (QID) | INTRAMUSCULAR | Status: DC | PRN

## 2022-09-04 MED ORDER — DEXAMETHASONE SODIUM PHOSPHATE 10 MG/ML IJ SOLN
INTRAMUSCULAR | Status: DC | PRN
  Administered 2022-09-04: 10 mg via INTRAVENOUS

## 2022-09-04 MED ORDER — BUPIVACAINE LIPOSOME 1.3 % IJ SUSP
INTRAMUSCULAR | Status: AC
  Filled 2022-09-04: qty 20

## 2022-09-04 MED ORDER — NEOMYCIN SULFATE 500 MG PO TABS: 1000.0000 mg | ORAL_TABLET | ORAL | Status: DC

## 2022-09-04 MED ORDER — GABAPENTIN 300 MG PO CAPS: 300.0000 mg | ORAL_CAPSULE | Freq: Two times a day (BID) | ORAL | Status: DC | PRN

## 2022-09-04 MED ORDER — ONDANSETRON HCL 4 MG/2ML IJ SOLN
INTRAMUSCULAR | Status: AC
  Filled 2022-09-04: qty 2

## 2022-09-04 MED ORDER — HYDROMORPHONE HCL 1 MG/ML IJ SOLN
0.2500 mg | INTRAMUSCULAR | Status: DC | PRN
  Administered 2022-09-04 (×4): 0.5 mg via INTRAVENOUS

## 2022-09-04 MED ORDER — SIMETHICONE 80 MG PO CHEW: 40.0000 mg | CHEWABLE_TABLET | Freq: Four times a day (QID) | ORAL | Status: DC | PRN

## 2022-09-04 MED ORDER — LACTATED RINGERS IV BOLUS: 1000.0000 mL | Freq: Three times a day (TID) | INTRAVENOUS | Status: DC | PRN

## 2022-09-04 MED ORDER — LIDOCAINE HCL 2 % IJ SOLN
INTRAMUSCULAR | Status: AC
  Filled 2022-09-04: qty 20

## 2022-09-04 MED ORDER — ACETAMINOPHEN 500 MG PO TABS: 1000.0000 mg | ORAL_TABLET | Freq: Four times a day (QID) | ORAL | Status: DC

## 2022-09-04 MED ORDER — BUPIVACAINE LIPOSOME 1.3 % IJ SUSP: 20.0000 mL | Freq: Once | INTRAMUSCULAR | Status: DC

## 2022-09-04 MED ORDER — INDOCYANINE GREEN 25 MG IV SOLR
INTRAVENOUS | Status: DC | PRN
  Administered 2022-09-04: 2.5 mg via INTRAVENOUS

## 2022-09-04 MED ORDER — ROCURONIUM BROMIDE 10 MG/ML (PF) SYRINGE
PREFILLED_SYRINGE | INTRAVENOUS | Status: DC | PRN
  Administered 2022-09-04: 10 mg via INTRAVENOUS
  Administered 2022-09-04: 20 mg via INTRAVENOUS
  Administered 2022-09-04: 80 mg via INTRAVENOUS
  Administered 2022-09-04: 10 mg via INTRAVENOUS
  Administered 2022-09-04 (×2): 20 mg via INTRAVENOUS

## 2022-09-04 MED ORDER — LIDOCAINE HCL (PF) 2 % IJ SOLN
INTRAMUSCULAR | Status: AC
  Filled 2022-09-04: qty 5

## 2022-09-04 MED ORDER — MAGIC MOUTHWASH: 15.0000 mL | Freq: Four times a day (QID) | ORAL | Status: DC | PRN

## 2022-09-04 MED ORDER — DIPHENHYDRAMINE HCL 12.5 MG/5ML PO ELIX: 12.5000 mg | ORAL_SOLUTION | Freq: Four times a day (QID) | ORAL | Status: DC | PRN

## 2022-09-04 MED ORDER — LACTATED RINGERS IV SOLN: INTRAVENOUS | Status: DC | PRN

## 2022-09-04 MED ORDER — ORAL CARE MOUTH RINSE: 15.0000 mL | Freq: Once | OROMUCOSAL | Status: AC

## 2022-09-04 MED ORDER — LIDOCAINE HCL (PF) 2 % IJ SOLN
INTRAMUSCULAR | Status: DC | PRN
  Administered 2022-09-04: 1.5 mg/kg/h via INTRADERMAL

## 2022-09-04 MED ORDER — BUPIVACAINE-EPINEPHRINE (PF) 0.25% -1:200000 IJ SOLN
INTRAMUSCULAR | Status: AC
  Filled 2022-09-04: qty 30

## 2022-09-04 MED ORDER — LORAZEPAM 0.5 MG PO TABS: 0.5000 mg | ORAL_TABLET | Freq: Three times a day (TID) | ORAL | Status: DC | PRN

## 2022-09-04 MED ORDER — METHOCARBAMOL 500 MG PO TABS
1000.0000 mg | ORAL_TABLET | Freq: Four times a day (QID) | ORAL | Status: DC | PRN
  Administered 2022-09-04 – 2022-09-10 (×11): 1000 mg via ORAL
  Filled 2022-09-04 (×12): qty 2

## 2022-09-04 MED ORDER — PROPOFOL 10 MG/ML IV BOLUS
INTRAVENOUS | Status: DC | PRN
  Administered 2022-09-04 (×2): 20 mg via INTRAVENOUS
  Administered 2022-09-04: 200 mg via INTRAVENOUS

## 2022-09-04 MED ORDER — MIDAZOLAM HCL 5 MG/5ML IJ SOLN
INTRAMUSCULAR | Status: DC | PRN
  Administered 2022-09-04: 2 mg via INTRAVENOUS

## 2022-09-04 MED ORDER — METHOCARBAMOL 1000 MG/10ML IJ SOLN: 1000.0000 mg | Freq: Four times a day (QID) | INTRAVENOUS | Status: DC | PRN

## 2022-09-04 SURGICAL SUPPLY — 129 items
ADH SKN CLS APL DERMABOND .7 (GAUZE/BANDAGES/DRESSINGS) ×1
APL PRP STRL LF DISP 70% ISPRP (MISCELLANEOUS) ×1
APPLIER CLIP 5 13 M/L LIGAMAX5 (MISCELLANEOUS)
APPLIER CLIP ROT 10 11.4 M/L (STAPLE)
APR CLP MED LRG 11.4X10 (STAPLE)
APR CLP MED LRG 5 ANG JAW (MISCELLANEOUS)
BAG COUNTER SPONGE SURGICOUNT (BAG) IMPLANT
BAG SPNG CNTER NS LX DISP (BAG)
BLADE EXTENDED COATED 6.5IN (ELECTRODE) ×2 IMPLANT
CANNULA REDUC XI 12-8 STAPL (CANNULA) ×1
CANNULA REDUCER 12-8 DVNC XI (CANNULA) ×2 IMPLANT
CELLS DAT CNTRL 66122 CELL SVR (MISCELLANEOUS) IMPLANT
CHLORAPREP W/TINT 26 (MISCELLANEOUS) ×2 IMPLANT
CLIP APPLIE 5 13 M/L LIGAMAX5 (MISCELLANEOUS) IMPLANT
CLIP APPLIE ROT 10 11.4 M/L (STAPLE) IMPLANT
CLIP LIGATING HEM O LOK PURPLE (MISCELLANEOUS) IMPLANT
CLIP LIGATING HEMO O LOK GREEN (MISCELLANEOUS) IMPLANT
COVER SURGICAL LIGHT HANDLE (MISCELLANEOUS) ×4 IMPLANT
COVER TIP SHEARS 8 DVNC (MISCELLANEOUS) ×2 IMPLANT
COVER TIP SHEARS 8MM DA VINCI (MISCELLANEOUS) ×1
DEFOGGER SCOPE WARMER CLEARIFY (MISCELLANEOUS) ×2 IMPLANT
DERMABOND ADVANCED .7 DNX12 (GAUZE/BANDAGES/DRESSINGS) IMPLANT
DEVICE TROCAR PUNCTURE CLOSURE (ENDOMECHANICALS) IMPLANT
DRAIN CHANNEL 19F RND (DRAIN) ×2 IMPLANT
DRAPE ARM DVNC X/XI (DISPOSABLE) ×8 IMPLANT
DRAPE COLUMN DVNC XI (DISPOSABLE) ×2 IMPLANT
DRAPE DA VINCI XI ARM (DISPOSABLE) ×4
DRAPE DA VINCI XI COLUMN (DISPOSABLE) ×1
DRAPE SURG IRRIG POUCH 19X23 (DRAPES) ×2 IMPLANT
DRSG OPSITE POSTOP 4X10 (GAUZE/BANDAGES/DRESSINGS) IMPLANT
DRSG OPSITE POSTOP 4X6 (GAUZE/BANDAGES/DRESSINGS) IMPLANT
DRSG OPSITE POSTOP 4X8 (GAUZE/BANDAGES/DRESSINGS) IMPLANT
DRSG TEGADERM 2-3/8X2-3/4 SM (GAUZE/BANDAGES/DRESSINGS) ×10 IMPLANT
DRSG TEGADERM 4X4.75 (GAUZE/BANDAGES/DRESSINGS) ×2 IMPLANT
ELECT REM PT RETURN 15FT ADLT (MISCELLANEOUS) ×2 IMPLANT
ENDOLOOP SUT PDS II  0 18 (SUTURE)
ENDOLOOP SUT PDS II 0 18 (SUTURE) IMPLANT
EVACUATOR SILICONE 100CC (DRAIN) ×2 IMPLANT
GAUZE SPONGE 2X2 8PLY STRL LF (GAUZE/BANDAGES/DRESSINGS) ×2 IMPLANT
GAUZE SPONGE 4X4 12PLY STRL (GAUZE/BANDAGES/DRESSINGS) IMPLANT
GLOVE BIO SURGEON STRL SZ7.5 (GLOVE) ×6 IMPLANT
GLOVE INDICATOR 8.0 STRL GRN (GLOVE) ×6 IMPLANT
GOWN SRG XL LVL 4 BRTHBL STRL (GOWNS) ×2 IMPLANT
GOWN STRL NON-REIN XL LVL4 (GOWNS) ×1
GOWN STRL REUS W/ TWL XL LVL3 (GOWN DISPOSABLE) ×10 IMPLANT
GOWN STRL REUS W/TWL XL LVL3 (GOWN DISPOSABLE) ×5
GRASPER SUT TROCAR 14GX15 (MISCELLANEOUS) IMPLANT
HOLDER FOLEY CATH W/STRAP (MISCELLANEOUS) ×2 IMPLANT
IRRIG SUCT STRYKERFLOW 2 WTIP (MISCELLANEOUS) ×1
IRRIGATION SUCT STRKRFLW 2 WTP (MISCELLANEOUS) ×2 IMPLANT
KIT PROCEDURE DA VINCI SI (MISCELLANEOUS) ×1
KIT PROCEDURE DVNC SI (MISCELLANEOUS) IMPLANT
KIT TURNOVER KIT A (KITS) IMPLANT
NDL INSUFFLATION 14GA 120MM (NEEDLE) ×2 IMPLANT
NEEDLE INSUFFLATION 14GA 120MM (NEEDLE) ×1 IMPLANT
PACK CARDIOVASCULAR III (CUSTOM PROCEDURE TRAY) ×2 IMPLANT
PACK COLON (CUSTOM PROCEDURE TRAY) ×2 IMPLANT
PAD POSITIONING PINK XL (MISCELLANEOUS) ×2 IMPLANT
PENCIL SMOKE EVACUATOR (MISCELLANEOUS) IMPLANT
PROTECTOR NERVE ULNAR (MISCELLANEOUS) ×4 IMPLANT
RELOAD STAPLE 45 3.5 BLU DVNC (STAPLE) IMPLANT
RELOAD STAPLE 45 4.3 GRN DVNC (STAPLE) IMPLANT
RELOAD STAPLE 60 2.5 WHT DVNC (STAPLE) IMPLANT
RELOAD STAPLE 60 3.5 BLU DVNC (STAPLE) IMPLANT
RELOAD STAPLE 60 4.3 GRN DVNC (STAPLE) IMPLANT
RELOAD STAPLER 2.5X60 WHT DVNC (STAPLE) ×1 IMPLANT
RELOAD STAPLER 3.5X45 BLU DVNC (STAPLE) IMPLANT
RELOAD STAPLER 3.5X60 BLU DVNC (STAPLE) IMPLANT
RELOAD STAPLER 4.3X45 GRN DVNC (STAPLE) IMPLANT
RELOAD STAPLER 4.3X60 GRN DVNC (STAPLE) ×5 IMPLANT
RETRACTOR WND ALEXIS 18 MED (MISCELLANEOUS) IMPLANT
RTRCTR WOUND ALEXIS 18CM MED (MISCELLANEOUS)
SCISSORS LAP 5X35 DISP (ENDOMECHANICALS) IMPLANT
SEAL UNIV 5-12 XI (MISCELLANEOUS) ×8 IMPLANT
SEAL XI UNIVERSAL 5-12 (MISCELLANEOUS) ×4
SEALER VESSEL DA VINCI XI (MISCELLANEOUS) ×1
SEALER VESSEL EXT DVNC XI (MISCELLANEOUS) ×2 IMPLANT
SLEEVE ADV FIXATION 5X100MM (TROCAR) IMPLANT
SOL ELECTROSURG ANTI STICK (MISCELLANEOUS) ×1
SOLUTION ELECTROSURG ANTI STCK (MISCELLANEOUS) ×2 IMPLANT
SPIKE FLUID TRANSFER (MISCELLANEOUS) ×2 IMPLANT
STAPLER 60 DA VINCI SURE FORM (STAPLE) ×1
STAPLER 60 SUREFORM DVNC (STAPLE) IMPLANT
STAPLER CVD CUT GN 40 RELOAD (ENDOMECHANICALS) ×1 IMPLANT
STAPLER CVD CUT GRN 40 RELOAD (ENDOMECHANICALS) IMPLANT
STAPLER ECHELON POWER CIR 29 (STAPLE) IMPLANT
STAPLER ECHELON POWER CIR 31 (STAPLE) IMPLANT
STAPLER RELOAD 2.5X60 WHITE (STAPLE) ×1
STAPLER RELOAD 2.5X60 WHT DVNC (STAPLE) ×1
STAPLER RELOAD 3.5X45 BLU DVNC (STAPLE)
STAPLER RELOAD 3.5X45 BLUE (STAPLE)
STAPLER RELOAD 3.5X60 BLU DVNC (STAPLE)
STAPLER RELOAD 3.5X60 BLUE (STAPLE)
STAPLER RELOAD 4.3X45 GREEN (STAPLE)
STAPLER RELOAD 4.3X45 GRN DVNC (STAPLE)
STAPLER RELOAD 4.3X60 GREEN (STAPLE) ×5
STAPLER RELOAD 4.3X60 GRN DVNC (STAPLE) ×5
STOPCOCK 4 WAY LG BORE MALE ST (IV SETS) ×4 IMPLANT
SURGILUBE 2OZ TUBE FLIPTOP (MISCELLANEOUS) ×2 IMPLANT
SUT ETHILON 2 0 PS N (SUTURE) IMPLANT
SUT MNCRL AB 4-0 PS2 18 (SUTURE) ×2 IMPLANT
SUT PDS AB 1 CT1 27 (SUTURE) IMPLANT
SUT PDS AB 1 TP1 96 (SUTURE) IMPLANT
SUT PROLENE 0 CT 2 (SUTURE) IMPLANT
SUT PROLENE 2 0 KS (SUTURE) ×2 IMPLANT
SUT PROLENE 2 0 SH DA (SUTURE) IMPLANT
SUT SILK 2 0 (SUTURE)
SUT SILK 2 0 SH CR/8 (SUTURE) IMPLANT
SUT SILK 2-0 18XBRD TIE 12 (SUTURE) IMPLANT
SUT SILK 3 0 (SUTURE) ×1
SUT SILK 3 0 SH CR/8 (SUTURE) ×2 IMPLANT
SUT SILK 3-0 18XBRD TIE 12 (SUTURE) ×2 IMPLANT
SUT V-LOC BARB 180 2/0GR6 GS22 (SUTURE)
SUT VIC AB 3-0 SH 18 (SUTURE) IMPLANT
SUT VIC AB 3-0 SH 27 (SUTURE)
SUT VIC AB 3-0 SH 27XBRD (SUTURE) IMPLANT
SUT VICRYL 0 UR6 27IN ABS (SUTURE) ×2 IMPLANT
SUTURE V-LC BRB 180 2/0GR6GS22 (SUTURE) IMPLANT
SYR 10ML LL (SYRINGE) ×2 IMPLANT
SYS LAPSCP GELPORT 120MM (MISCELLANEOUS)
SYS WOUND ALEXIS 18CM MED (MISCELLANEOUS) ×1
SYSTEM LAPSCP GELPORT 120MM (MISCELLANEOUS) IMPLANT
SYSTEM WOUND ALEXIS 18CM MED (MISCELLANEOUS) ×2 IMPLANT
TAPE UMBILICAL 1/8 X36 TWILL (MISCELLANEOUS) ×2 IMPLANT
TOWEL OR NON WOVEN STRL DISP B (DISPOSABLE) ×2 IMPLANT
TRAY FOLEY MTR SLVR 16FR STAT (SET/KITS/TRAYS/PACK) ×2 IMPLANT
TROCAR ADV FIXATION 5X100MM (TROCAR) ×2 IMPLANT
TUBING CONNECTING 10 (TUBING) ×6 IMPLANT
TUBING INSUFFLATION 10FT LAP (TUBING) ×2 IMPLANT

## 2022-09-04 NOTE — Transfer of Care (Signed)
Immediate Anesthesia Transfer of Care Note  Patient: ELGENE LOUVIER  Procedure(s) Performed: ROBOTIC LOW ANTERIOR RESECTION, BILATERAL TAP BLOCK, AND INOPERATIVE ASSESSMENT OF PERFUSION USING FIREFLY DYE LOOP ILEOSTOMY FLEXIBLE SIGMOIDOSCOPY  Patient Location: PACU  Anesthesia Type:General  Level of Consciousness: oriented, drowsy, and patient cooperative  Airway & Oxygen Therapy: Patient Spontanous Breathing and Patient connected to face mask oxygen  Post-op Assessment: Report given to RN and Post -op Vital signs reviewed and stable  Post vital signs: Reviewed  Last Vitals:  Vitals Value Taken Time  BP 149/86 09/04/22 1339  Temp    Pulse 85 09/04/22 1340  Resp 21 09/04/22 1341  SpO2 93 % 09/04/22 1340  Vitals shown include unvalidated device data.  Last Pain:  Vitals:   09/04/22 0701  TempSrc:   PainSc: 0-No pain         Complications: No notable events documented.

## 2022-09-04 NOTE — H&P (Addendum)
CC: Here today for surgery  HPI: Billy Roman is an 62 y.o. male with history of tobacco use, recent tremors (follows with neurology), whom is seen in the office today as a referral by Dr. Pasty Arch for evaluation of rectal cancer.  He underwent colonoscopy 11/19/21 -  - Preparation of the colon was fair. - Malignant partially obstructing tumor in the rectum. Biopsied. 12-19 cm estimated location - not palpable w/ finger. TATTOO x 2 distal to tumor placed in rectum w/ SPOT - Two 5 mm polyps in the sigmoid colon and in the ascending colon, removed with a cold snare. Resected and retrieved. - Diverticulosis in the sigmoid colon. - External and internal hemorrhoids. - The examination was otherwise normal on direct and retroflexion views.  PATH:  1. Sigmoid Colon Polyp, x 1 and ascending x 1 FINDINGS CONSISTENT WITH TUBULAR ADENOMA. NO HIGH-GRADE DYSPLASIA OR MALIGNANCY IS SEEN. 2. Rectum, biopsy, mass :MODERATELY TO POORLY DIFFERENTIATED ADENOCARCINOMA.  CEA 11/19/21 - 2.9 CT CAP 11/30/21 1. Extensive mural thickening in the distal sigmoid colon and proximal rectum suggesting infiltrative colorectal neoplasm with haziness of the mesorectal fat and adjacent mesorectal lymphadenopathy concerning for local direct invasion and nodal disease. 2. Small pulmonary nodules measuring 5 mm or less in the lungs, nonspecific. Metastatic disease is not excluded, but not favored on the basis of today's examination. Attention on follow-up studies is recommended to ensure stability. 3. No definite metastatic disease to the abdomen. 4. Aortic atherosclerosis, in addition to three-vessel coronary artery disease. Please note that although the presence of coronary artery calcium documents the presence of coronary artery disease, the severity of this disease and any potential stenosis cannot be assessed on this non-gated CT examination. Assessment for potential risk factor modification, dietary  therapy or pharmacologic therapy may be warranted, if clinically indicated. 5. Additional incidental findings, as above.  MRI Pelvis 12/03/21 - pending read. I was able to only see the images and this does appear to be in the mid and upper rectum encroaching on the sigmoid as well.  He is here today with his daughter whom is a previous patient of our practice, having had bariatric surgery with Dr. Kieth Brightly.  PAC placement 12/17/21 Underwent systemic therapy with Dr. Benay Spice. He is now undergoing Xeloda radiation having completed FOLFOX successfully. He would like to Port-A-Cath removed prior to the year and and therefore Dr. Benay Spice sent him back to see me.  XRT started 04/22/22, completed 05/31/22. Denies any complaints at present. He has been doing quite well. No nausea or vomiting. Having regular bowel movements. Working on quitting smoking -using nicotine tablets and has cut back significantly. Plans to quit smoking completely this week. Had his Port-A-Cath removed uneventfully.  He has unfortunately gained about 40 pounds. Discussed importance of healthy diet, exercise.  INTERVAL HX He has done well - no complaints at present. Tolerated prep with satisfactory result. States he is ready for surgery.  CT CAP 08/28/22 1. Interval decrease in size of mesorectal sheath lymph nodes. 2. Persistent submucosal thickening of the distal sigmoid colon and rectum. 3. Haziness in the presacral fat related to radiation treatment 4. No evidence of metastatic disease in the abdomen pelvis. 5. No skeletal metastasis. 6.  Aortic Atherosclerosis (ICD10-I70.0)   PMH: tobacco use, tremors  PSH: He denies any prior abdominal or pelvic surgical history.  FHx: Denies any known family history of colorectal, breast, endometrial or ovarian cancer  Social Hx: Smokes approximately 1 pack/day for 40 years. Social EtOH use.  Past Medical History:  Diagnosis Date   Anxiety    Arrhythmia    RBBB    Arthritis    Complication of anesthesia    Nausea   Dysrhythmia    [name], recurrent severe, without psychosis ([place]) 08/23/2021   Pneumonia    Rectal cancer ([place]) 11/20/2021   Sleep apnea     Past Surgical History:  Procedure Laterality Date   OPERATIVE ULTRASOUND Right 12/17/2021   Procedure: OPERATIVE ULTRASOUND;  Surgeon: Ileana Roup, MD;  Location: [facility];  Service: General;  Laterality: Right;   PORT-A-CATH REMOVAL N/A 05/20/2022   Procedure: REMOVAL PORT-A-CATH;  Surgeon: Ileana Roup, MD;  Location: [facility];  Service: General;  Laterality: N/A;   PORTACATH PLACEMENT Right 12/17/2021   Procedure: INSERTION PORT-A-CATH;  Surgeon: Ileana Roup, MD;  Location: [facility];  Service: General;  Laterality: Right;   RHINOPLASTY     SEPTOPLASTY     SHOULDER ARTHROSCOPY Left    SHOULDER ARTHROSCOPY WITH OPEN ROTATOR CUFF REPAIR AND DISTAL CLAVICLE ACROMINECTOMY Right 02/01/2021   Procedure: RIGHT SHOULDER ARTHROSCOPY WITH MINI OPEN ROTATOR CUFF REPAIR AND DISTAL CLAVICLE EXCISION;  Surgeon: Thornton Park, MD;  Location: [facility];  Service: Orthopedics;  Laterality: Right;   SPINE SURGERY     lumbar laminectomy   WRIST SURGERY Bilateral    corpal tunnel    Family History  Adopted: Yes  Problem Relation Age of Onset   Colon cancer Neg Hx    Stomach cancer Neg Hx    Esophageal cancer Neg Hx     Social:  reports that he has been smoking cigarettes. He has a 7.50 pack-year smoking history. He has never been exposed to tobacco smoke. He quit smokeless tobacco use about 34 years ago.  His smokeless tobacco use included chew. He reports that he does not currently use alcohol. He reports current drug use. Frequency: 1.00 time per week. Drug: Marijuana.  Allergies:  Allergies  Allergen Reactions   Omnipaque [Iohexol] Nausea And Vomiting    08/28/22- second time vomiting having scan with IV contrast Perfuse vomiting.     Propyphenazone Anaphylaxis, Itching and Rash    Not able to move    Medications: I have reviewed the patient's current medications.  Results for orders placed or performed during the hospital encounter of 09/04/22 (from the past 48 hour(s))  ABO/Rh     Status: None   Collection Time: 09/04/22  7:20 AM  Result Value Ref Range   ABO/RH(D)      A POS Performed at [facility], [place] [address], [place], [place] [address]     No results found.  PE Blood pressure 133/81, pulse 69, temperature 98.1 F (36.7 C), temperature source Oral, resp. rate 15, height 6\' 1"  (1.854 m), weight 126.1 kg, SpO2 97 %. Constitutional: NAD; conversant Eyes: Moist conjunctiva Lungs: Normal respiratory effort CV: RRR GI: Abd soft, NT/ND MSK: Normal range of motion of extremities Psychiatric: Appropriate affect  Results for orders placed or performed during the hospital encounter of 09/04/22 (from the past 48 hour(s))  ABO/Rh     Status: None   Collection Time: 09/04/22  7:20 AM  Result Value Ref Range   ABO/RH(D)      A POS Performed at [facility], [place] [address], [place], [place] [address]     No results found.   A/P: Billy Roman is an 62 y.o. male with hx of tobacco use,  recent tremors here for follow-up of recently diagnosed mid and upper rectal cancer - here to discuss surgery for his rectal cancer  CT CAP 11/2021 -no evidence of metastatic disease but there are suspicious appearing lymph nodes. 5 mm pulmonary nodules that will need surveillance.  MRI pelvis 12/03/2021- [id number] with threatened CRM from lymph node  Undergoing TNT with Dr. Benay Spice, completed systemic treatment and chemo/XRT 12/15  -Order CT CAP for restaging, urgent  -We spent time today discussing surgery-robotic assisted low anterior resection, diverting loop ileostomy, flexible sigmoidoscopy. We discussed technical considerations as well as things to expect with regards to  an ostomy. We also discussed rationale therein. I have again strongly recommend he quit smoking. We recommended he come down the nicotine supplements and off completely at least 2 to 3 weeks prior to surgery. He expressed understanding. We discussed high risks of anastomotic complications and even permanent ostomies with nicotine on board. He expresses understanding of all this.  -The anatomy and physiology of the GI tract was reviewed with the patient. The pathophysiology of rectal was discussed as well with associated pictures again today. -We have discussed various different treatment options going forward including surgery (the most definitive) to address this - particularly given that this lesion is too proximal to reliably follow with adequate digital rectal exams in the office in the early era of watch/wait.   -The planned procedure, material risks (including, but not limited to, pain, bleeding, infection, scarring, need for blood transfusion, damage to surrounding structures- blood vessels/nerves/viscus/organs, damage to ureter, urine leak, leak from anastomosis, need for additional procedures, sexual dysfunction, scenarios where a stoma may be necessary and where it may be permanent, worsening of pre-existing medical conditions, hernia, recurrence, pneumonia, heart attack, stroke, death) benefits and alternatives to surgery were discussed at length. The patient's questions were answered to his satisfaction, he voiced understanding and elected to proceed with surgery. Additionally, we discussed typical postoperative expectations and the recovery process.   Nadeen Landau, [facility], [place]

## 2022-09-04 NOTE — Anesthesia Procedure Notes (Signed)
Procedure Name: Intubation Date/Time: 09/04/2022 8:43 AM  Performed by: Jenne Campus, CRNAPre-anesthesia Checklist: Patient identified, Emergency Drugs available, Suction available and Patient being monitored Patient Re-evaluated:Patient Re-evaluated prior to induction Oxygen Delivery Method: Circle System Utilized Preoxygenation: Pre-oxygenation with 100% oxygen Induction Type: IV induction Ventilation: Mask ventilation without difficulty and Oral airway inserted - appropriate to patient size Laryngoscope Size: Glidescope and 4 Grade View: Grade I Tube type: Oral Tube size: 7.5 mm Number of attempts: 1 Airway Equipment and Method: Stylet and Oral airway Placement Confirmation: ETT inserted through vocal cords under direct vision, positive ETCO2 and breath sounds checked- equal and bilateral Secured at: 22 cm Tube secured with: Tape Dental Injury: Teeth and Oropharynx as per pre-operative assessment

## 2022-09-04 NOTE — Anesthesia Preprocedure Evaluation (Addendum)
Anesthesia Evaluation  Patient identified by MRN, date of birth, ID band Patient awake    Reviewed: Allergy & Precautions, H&P , NPO status , Patient's Chart, lab work & pertinent test results  Airway Mallampati: III  TM Distance: >3 FB Neck ROM: Full    Dental no notable dental hx. (+) Poor Dentition, Dental Advisory Given   Pulmonary sleep apnea , Current Smoker   Pulmonary exam normal breath sounds clear to auscultation       Cardiovascular negative cardio ROS Normal cardiovascular exam Rhythm:Regular Rate:Normal     Neuro/Psych negative neurological ROS  negative psych ROS   GI/Hepatic negative GI ROS, Neg liver ROS,,,  Endo/Other  negative endocrine ROS    Renal/GU negative Renal ROS  negative genitourinary   Musculoskeletal negative musculoskeletal ROS (+)    Abdominal   Peds negative pediatric ROS (+)  Hematology negative hematology ROS (+)   Anesthesia Other Findings   Reproductive/Obstetrics negative OB ROS                              Anesthesia Physical Anesthesia Plan  ASA: 3  Anesthesia Plan: General   Post-op Pain Management: Tylenol PO (pre-op)*   Induction: Intravenous  PONV Risk Score and Plan: 1 and Ondansetron, Dexamethasone and Treatment may vary due to age or medical condition  Airway Management Planned: Oral ETT  Additional Equipment:   Intra-op Plan:   Post-operative Plan: Extubation in OR  Informed Consent: I have reviewed the patients History and Physical, chart, labs and discussed the procedure including the risks, benefits and alternatives for the proposed anesthesia with the patient or authorized representative who has indicated his/her understanding and acceptance.     Dental advisory given  Plan Discussed with: CRNA and Surgeon  Anesthesia Plan Comments:         Anesthesia Quick Evaluation

## 2022-09-04 NOTE — Op Note (Signed)
PATIENT: Billy Roman  62 y.o. male  Patient Care Team: Camillia Herter, NP as PCP - General (Nurse Practitioner) Lin Givens, RN as Oncology Nurse Navigator  PREOP DIAGNOSIS: RECTAL CANCER  POSTOP DIAGNOSIS: RECTAL CANCER  PROCEDURE:  Robotic assisted low anterior resection with diverting loop ileostomy Intraoperative assessment of perfusion using ICG fluorescence imaging Flexible sigmoidoscopy Bilateral transversus abdominus plane (TAP) blocks  SURGEON: Sharon Mt. Layaan Mott, MD  ASSISTANT: Michael Boston, MD  ANESTHESIA: General endotracheal  EBL: 180 mL Total I/O In: 1200 [I.V.:1100; IV Piggyback:100] Out: 415 [Urine:235; Blood:180]  DRAINS: 25 Fr round blake drain left draining the pelvis  SPECIMEN:  Rectosigmoid colon Additional rectum Anterior mesorectum Distal anastomotic donut ("final distal margin")  COUNTS: Sponge, needle and instrument counts were reported correct x2  FINDINGS: Empty right inguinal hernia. Tattoo in rectum with mass/annular scar proximal to it. No evident metastatic disease on visceral parietal peritoneum or liver. A well perfused, tension free, hemostatic, air tight 29 mm EEA colorectal anastomosis fashioned 10 cm from the anal verge by flexible sigmoidoscopy.   NARRATIVE: Informed consent was verified. The patient was taken to the operating room, placed supine on the operating table and SCD's were applied. General endotracheal anesthesia was induced without difficulty. He was then positioned in the lithotomy position with Allen stirrups.  Pressure points were evaluated and padded.  A foley catheter was then placed by nursing under sterile conditions. Hair on the abdomen was clipped.  He was secured to the operating table. The abdomen was then prepped and draped in the standard sterile fashion. Surgical timeout was called indicating the correct patient, procedure, positioning and need for preoperative antibiotics.   An OG tube was  placed by anesthesia and confirmed to be to suction.  At Palmer's point, a stab incision was created and the Veress needle was introduced into the peritoneal cavity on the first attempt.  Intraperitoneal location was confirmed by the aspiration and saline drop test.  Pneumoperitoneum was established to a maximum pressure of 15 mmHg using CO2.  Following this, the abdomen was marked for planned trocar sites.  Just to the right and cephalad to the umbilicus, an 8 mm incision was created and an 8 mm blunt tipped robotic trocar was cautiously placed into the peritoneal cavity.  The laparoscope was inserted and demonstrated no evidence of trocar site nor Veress needle site complications.  The Veress needle was removed.  Bilateral transversus abdominis plane blocks were then created using a dilute mixture of Exparel with Marcaine.  3 additional 8 mm robotic trochars were placed under direct visualization roughly in a line extending from the right ASIS towards the left upper quadrant. The bladder was inspected and noted to be at/below the pubic symphysis.  Staying 3 fingerbreadths above the pubic symphysis, an incision was created and the 12 mm robotic trocar inserted directed cephalad into the peritoneal cavity under direct visualization.  An additional 5 mm assist port was placed in the right lateral abdomen under direct visualization.  The abdomen was surveyed.  Surfaces the liver are normal in appearance.  The peritoneal surfaces are normal.  He has an incidentally identified right inguinal hernia which is empty.  He was positioned in Trendelenburg with the left side tilted slightly up.  Small bowel was carefully retracted out of the pelvis.  The robot was then docked and I went to the console.   The sigmoid colon was readily identified.  Attachments of the sigmoid colon were taken down from the  intersigmoid fossa.  The rectosigmoid colon was grasped and elevated anteriorly.  Beginning with a medial to lateral  approach, the peritoneum overlying the presacral space was carefully incised.  The TME plane was readily gained working in a plane between the fascia propria of the rectum and the presacral fascia.  Hypogastric nerves were seen going along the the presacral fascia and were protected free of injury.  Working more proximally, the mesorectum and sigmoid mesentery were carefully mobilized off of the peritoneum.  The left ureter was identified and protected free of injury.  The left gonadal vessels were identified and protected.  These were both swept "down."  The superior hemorrhoidal and IMA pedicles were identified. Further mesocolon was mobilized proximally staying in this plane between the retroperitoneum proper and the mesocolon. Attention was then turned to the lateral portion of dissection.  The sigmoid colon was then retracted to the right.  The sigmoid colon was fully mobilized. The descending colon was mobilized by incising the Lexander Tremblay line of [place].  This was done all the way up to the level of the splenic flexure.  The associated mesocolon was also mobilized medially.  The left ureter again was confirmed to be well away from the vasculature which had been dissected medially.  The rectosigmoid colon was elevated anteriorly. The left ureter was re-identified. The IMA was clear of this.  We attempted to close her vessel sealer on the IMA but in doing so noted that there was significant calcification of this vessel and we are concerned for an inadequate seal with attempted vessel sealer use.  Therefore, a Kendrik Mcshan load 60 mm robotic stapler was utilized to seal and divide the IMA.  The stump was inspected and noted to be with well-formed staples and hemostatic.  The mesentery was divided out to the point of planned proximal division.  Attention was then redirected the pelvis.  The rectum is retracted anteriorly.  Maintaining her plane between the fascia propria the rectum and the presacral fascia, TME dissection  is continued until we are at least 4 to 5 cm distal to the mass.  This plane is then continued laterally and then terminated anteriorly.  All mesorectum was cleared circumferentially.  A 60 mm green load robotic stapler was then placed through the 12 mm port and introduced into the peritoneal cavity.  The rectum was divided with 2 firings of the stapler.  The stump was intact and healthy in appearance.   Attention was turned to performing a perfusion test. ICG was administered by anesthesia and at the level of the cleared mesentery proximally, there was excellent uptake of the tracer.  The rectum was also well perfused in appearance.  There was a visible pulse in the mesentery out to the level of the cleared colon at the level of the proximal sigmoid/descending colon junction.  This colon is also supple and healthy in appearance without any thickening.  This reached into the pelvis without any difficulty and remained in that location without any tension. A locking grasper was then placed on the sigmoid staple line.   Attention was turned to the extracorporeal portion of the procedure.  The robot was undocked.  I scrubbed back in.  Using the 12 mm trocar site, a Pfannenstiel incision was created and incorporated the fascial opening through the 12 mm port site.  The rectus fascia was incised and then elevated.  The rectus muscle was mobilized free of the overlying fascia.  The peritoneum was incised in the midline well  above the location of the bladder.  An R[place] wound protector was placed.  Towels were placed around the field.  The divided colon was passed through the wound protector.  The point of proximal division was identified and was again on a healthy segment of supple colon with a palpable pulse in the mesentery. This was pink in color.  A pursestring device was applied.  A 2-0 Prolene on a Keith needle was passed.  The colon was divided and passed off with the open end being proximal.  EEA sizers were  then introduced and a 29 mm EEA selected.  "Belt loops" consisting of 3-0 silk were placed around the pursestring suture line.  The anvil was placed and the pursestring tied.  A small amount of fat was cleared from the planned anastomosis and no diverticula were apparent within this.  This was placed back into the abdomen and a cap placed over the wound protector port site.   I then went to the back table and evaluate the specimen.  We open the specimen beginning at the sigmoid and working her way down along the [place] until we got to the level of the mass.  Were then able to rule this out.  There is annular masslike thickening at the location of his suspected cancer and it appears that we have an approximate 1 to 2 cm distal staple line.  Given that this is a proximal rectal cancer, we opted for a further distal margin.  Pneumoperitoneum was reestablished and the robot is redocked.  The rectum was grasped and retracted back anteriorly in the pelvis.  We were able to go at least an additional 3 to 4 cm distal to the staple line and clear the mesorectum circumferentially.  In doing this, some of the anterior mesorectum did come free.  This was submitted separately as a specimen.  The mesorectum was fully dissected circumferentially.  A 60 mm green load robotic stapler was passed.  We attempted to divide using the stapler, however, due to his adiposity and narrow male pelvis, opted to complete the transection of this using a contoured stapler.  The robot is undocked.  I scrubbed back in.  A green load contour stapler was unable to be passed down to the level of planned transection.  There are well-formed staples.  The specimen was oriented with a stitch marking the distalmost staple line and passed off.  Pneumoperitoneum was reestablished.  I then went below to pass the stapler.  My partner remained above.  EEA sizers were cautiously introduced via the anus and advanced under direct visualization.  The stapler  was passed and the spike deployed just anterior to the staple line.  The components were then mated.  Orientation was confirmed such that there is no twisting of the colon nor small bowel underneath the mesenteric defect. Care was taken to ensure no other structures were incorporated within this either.  The stapler was then closed, held, and fired. This was then removed. The donuts were inspected and noted to be complete.  The colon proximal to the anastomosis was then gently occluded. The pelvis was filled with sterile irrigation. Under direct visualization, I passed a flexible sigmoidoscope.  The anastomosis was under water.  With good distention of the anastomosis there was no air leak. The anastomosis pink in appearance.  This is located at 10 cm from the anal verge by flexible sigmoidoscopy.  It is hemostatic.  Additionally, looking from above, there is no tension on the  colon or mesentery.  Sigmoidoscope was withdrawn.  Irrigation was evacuated from the pelvis.  The abdomen and pelvis are surveyed and noted to be completely hemostatic without any apparent injury.  A 19 French round Blake drain was placed through one of the right lateral abdominal wall incisions and left draining the deep pelvis and our anastomosis.  This was secured to the skin with a 2-0 Prolene suture.  The cecum and appendix identified and normal in appearance.  The terminal ileum was identified.  We worked back approximately 20 to 30 cm for planned loop ileostomy.  The skin at the premarked ileostomy site is excised in a wheal.  The fascia is opened and the rectus muscle spread.  We are able to bring the loop of ileum through this.  A Babcock was placed on this to maintain its location.  Under direct visualization, all trochars are removed.  The [place] wound protector was removed.  Gowns/gloves are changed and a fresh set of clean instruments utilized. Additional sterile drapes were placed around the field.   The Pfannenstiel  peritoneum was closed with a running 2-0 Vicryl suture.  The rectus fascia was then closed using 2 running #1 PDS sutures.  The fascia was then palpated and noted to be completely closed.  Additional anesthetic was infiltrated at the Pfannenstiel site.  Sponge, needle, and instrument counts were reported correct x2. 4-0 Monocryl subcuticular suture was used to close the skin of all incision sites.  Dermabond was placed over all incisions.  Attention is then directed at maturing the ileostomy.  The ileostomy was matured in a loop configuration, Brooke manner.  This was done using 3-0 Vicryl suture.  There is nice blotting of the stoma at the skin level.  An ostomy appliance was then cut to fit.   He was then taken out of lithotomy, awakened from anesthesia, extubated, and transferred to a stretcher for transport to PACU in satisfactory condition having tolerated the procedure well.

## 2022-09-05 ENCOUNTER — Encounter (HOSPITAL_COMMUNITY): Payer: Self-pay | Admitting: Surgery

## 2022-09-05 DIAGNOSIS — G8929 Other chronic pain: Secondary | ICD-10-CM | POA: Insufficient documentation

## 2022-09-05 DIAGNOSIS — K4090 Unilateral inguinal hernia, without obstruction or gangrene, not specified as recurrent: Secondary | ICD-10-CM

## 2022-09-05 DIAGNOSIS — G4700 Insomnia, unspecified: Secondary | ICD-10-CM | POA: Insufficient documentation

## 2022-09-05 LAB — BASIC METABOLIC PANEL
Anion gap: 9 (ref 5–15)
BUN: 12 mg/dL (ref 6–20)
CO2: 26 mmol/L (ref 22–32)
Calcium: 8.9 mg/dL (ref 8.9–10.3)
Chloride: 102 mmol/L (ref 98–111)
Creatinine, Ser: 0.88 mg/dL (ref 0.61–1.24)
GFR, Estimated: >60 mL/min (ref >60)
Glucose, Bld: 121 mg/dL — ABNORMAL HIGH (ref 70–99)
Potassium: 3.7 mmol/L (ref 3.5–5.1)
Sodium: 137 mmol/L (ref 135–145)

## 2022-09-05 LAB — CBC
HCT: 42.9 % (ref 39.0–52.0)
Hemoglobin: 14.1 g/dL (ref 13.0–17.0)
MCH: 31.7 pg (ref 26.0–34.0)
MCHC: 32.9 g/dL (ref 30.0–36.0)
MCV: 96.4 fL (ref 80.0–100.0)
Platelets: 191 10*3/uL (ref 150–400)
RBC: 4.45 MIL/uL (ref 4.22–5.81)
RDW: 12.1 % (ref 11.5–15.5)
WBC: 9 10*3/uL (ref 4.0–10.5)
nRBC: 0 % (ref 0.0–0.2)

## 2022-09-05 MED ORDER — GABAPENTIN 100 MG PO CAPS
300.0000 mg | ORAL_CAPSULE | Freq: Three times a day (TID) | ORAL | Status: DC
  Administered 2022-09-05 – 2022-09-10 (×18): 300 mg via ORAL
  Filled 2022-09-05 (×8): qty 3
  Filled 2022-09-05: qty 1
  Filled 2022-09-05 (×9): qty 3

## 2022-09-05 MED ORDER — SODIUM CHLORIDE 0.9% FLUSH: 3.0000 mL | INTRAVENOUS | Status: DC | PRN

## 2022-09-05 MED ORDER — SODIUM CHLORIDE 0.9% FLUSH
3.0000 mL | Freq: Two times a day (BID) | INTRAVENOUS | Status: DC
  Administered 2022-09-05: 3 mL via INTRAVENOUS

## 2022-09-05 MED ORDER — SODIUM CHLORIDE 0.9 % IV SOLN: 250.0000 mL | INTRAVENOUS | Status: DC | PRN

## 2022-09-05 MED ORDER — LACTATED RINGERS IV BOLUS: 1000.0000 mL | Freq: Three times a day (TID) | INTRAVENOUS | Status: DC | PRN

## 2022-09-05 NOTE — Consult Note (Addendum)
[facility] Nurse ostomy consult note Pt had ileostomy surgery performed yesterday.  He has attempted to reach his daughter to arrange a teaching session but she is not available.  I will check in with him tomorrow morning to determine what time a pouch change and teaching session can be performed on Fri; informed patient it will need to occur before 2pm and he verbalized understanding. Current convex pouch is intact with good seal and large amt liquid brown stool, stoma is red and viable when visualized through the pouch. Educational materials left at the bedside, along with 6 sets of supplies for staff nurse use.  Use supplies: Use Supplies: barrier ring, Lawson # [id number] and convex Roselee Culver # [phone] Enrolled patient in [place] program: NOT YET Thank-you,  Julien Girt MSN, RN, [place], [place], [place]

## 2022-09-05 NOTE — Evaluation (Signed)
Physical Therapy Evaluation Patient Details Name: Billy Roman MRN: [id number] DOB: 1960-09-30 Today's Date: 09/05/2022  History of Present Illness  Patient is 62 y.o. male admitted  3/20 for scheduled Robotic assisted low anterior resection with diverting loop ileostomy.  PMH includes CA, Depression, Anxiety, Sleep Apnea.   Clinical Impression  Billy Roman is 62 y.o. male admitted with above HPI and diagnosis. Patient is currently limited by functional impairments below (see PT problem list). Patient lives alone and is independent at baseline. Currently pt limited by decreased activity tolerance, decreased balance, decreased mobility, decreased knowledge of use of DME, decreased knowledge of precautions, obesity, pain, and decreased skin integrity. Currently he is mobilizing in bed with use of bed features, supervision for sit<>stands and supervision/guarding for safety to ambulate with RW. Patient will benefit from continued skilled PT interventions to address impairments and progress independence with mobility, recommending HHPT follow up. Acute PT will follow and progress as able.        Recommendations for follow up therapy are one component of a multi-disciplinary discharge planning process, led by the attending physician.  Recommendations may be updated based on patient status, additional functional criteria and insurance authorization.  Follow Up Recommendations Home health PT      Assistance Recommended at Discharge Set up Supervision/Assistance  Patient can return home with the following  A little help with walking and/or transfers;A little help with bathing/dressing/bathroom;Assistance with cooking/housework;Direct supervision/assist for medications management;Help with stairs or ramp for entrance;Assist for transportation    Equipment Recommendations Rolling walker (2 wheels) (TBD as pt progresses)  Recommendations for Other Services       Functional Status  Assessment Patient has had a recent decline in their functional status and demonstrates the ability to make significant improvements in function in a reasonable and predictable amount of time.     Precautions / Restrictions Precautions Precautions: Fall Precaution Comments: ileostomy, JP Drain Restrictions Weight Bearing Restrictions: No      Mobility  Bed Mobility Overal bed mobility: Needs Assistance Bed Mobility: Rolling, Sidelying to Sit Rolling: Supervision Sidelying to sit: Supervision, HOB elevated   Sit to supine: Supervision, HOB elevated   General bed mobility comments: HOB slightly elevate, use of bed features, increased time.    Transfers Overall transfer level: Needs assistance Equipment used: Rolling walker (2 wheels), None Transfers: Sit to/from Stand, Bed to chair/wheelchair/BSC Sit to Stand: Supervision   Step pivot transfers: Supervision       General transfer comment: sup for safety, cues for use of UE or pillow to brace abdomen with position changes.    Ambulation/Gait Ambulation/Gait assistance: Min guard, Supervision Gait Distance (Feet): 200 Feet Assistive device: Rolling walker (2 wheels) Gait Pattern/deviations: Step-through pattern, Decreased stride length, Trunk flexed Gait velocity: decr/fair     General Gait Details: cues for position to RW, height adjusted for improved use/shoulder comfort. pt wiht slightly flexed trunk secondary to abdominal pain. cues to brace abdomen when coughing.  Stairs            Wheelchair Mobility    Modified Rankin (Stroke Patients Only)       Balance Overall balance assessment: Needs assistance Sitting-balance support: Feet supported Sitting balance-Leahy Scale: Good     Standing balance support: No upper extremity supported Standing balance-Leahy Scale: Fair                               Pertinent Vitals/Pain Pain Assessment  Pain Assessment: Faces Faces Pain Scale: Hurts  even more Pain Location: Incisional Pain Descriptors / Indicators: Grimacing, Guarding Pain Intervention(s): Limited activity within patient's tolerance, Monitored during session, Premedicated before session, Repositioned    Home Living Family/patient expects to be discharged to:: Private residence Living Arrangements: Alone Available Help at Discharge: Family;Friend(s);Available PRN/intermittently Type of Home: House Home Access: Stairs to enter Entrance Stairs-Rails:  (no sturdy rail) Technical brewer of Steps: 2 Alternate Level Stairs-Number of Steps: 8 or 9 Home Layout: Two level;Bed/bath upstairs Home Equipment:  (adjustable bed)      Prior Function Prior Level of Function : Independent/Modified Independent;Driving                     Hand Dominance   Dominant Hand: Right    Extremity/Trunk Assessment   Upper Extremity Assessment Upper Extremity Assessment: Overall WFL for tasks assessed    Lower Extremity Assessment Lower Extremity Assessment: Overall WFL for tasks assessed    Cervical / Trunk Assessment Cervical / Trunk Assessment: Other exceptions Cervical / Trunk Exceptions: abdominal surgery  Communication   Communication: No difficulties  Cognition Arousal/Alertness: Awake/alert Behavior During Therapy: WFL for tasks assessed/performed Overall Cognitive Status: Within Functional Limits for tasks assessed                                          General Comments      Exercises     Assessment/Plan    PT Assessment Patient needs continued PT services  PT Problem List Decreased activity tolerance;Decreased balance;Decreased mobility;Decreased knowledge of use of DME;Decreased knowledge of precautions;Obesity;Pain;Decreased skin integrity       PT Treatment Interventions DME instruction;Gait training;Stair training;Functional mobility training;Therapeutic activities;Therapeutic exercise;Balance training;Neuromuscular  re-education;Patient/family education    PT Goals (Current goals can be found in the Care Plan section)  Acute Rehab PT Goals Patient Stated Goal: get home and keep moving to recover PT Goal Formulation: With patient Time For Goal Achievement: 09/19/22 Potential to Achieve Goals: Good    Frequency Min 3X/week     Co-evaluation               AM-PAC PT "6 Clicks" Mobility  Outcome Measure Help needed turning from your back to your side while in a flat bed without using bedrails?: A Little Help needed moving from lying on your back to sitting on the side of a flat bed without using bedrails?: A Little Help needed moving to and from a bed to a chair (including a wheelchair)?: A Little Help needed standing up from a chair using your arms (e.g., wheelchair or bedside chair)?: A Little Help needed to walk in hospital room?: A Little Help needed climbing 3-5 steps with a railing? : A Little 6 Click Score: 18    End of Session Equipment Utilized During Treatment: Gait belt Activity Tolerance: Patient tolerated treatment well Patient left: in bed;with call bell/phone within reach;with bed alarm set (partial chair position) Nurse Communication: Mobility status PT Visit Diagnosis: Muscle weakness (generalized) (M62.81);Difficulty in walking, not elsewhere classified (R26.2);Pain Pain - part of body:  (abdomen)    Time: [id number] PT Time Calculation (min) (ACUTE ONLY): 31 min   Charges:   PT Evaluation $PT Eval Moderate Complexity: 1 Mod PT Treatments $Gait Training: 8-22 mins        Verner Mould, DPT Acute Rehabilitation Services Office [phone]  09/05/22 11:08 AM

## 2022-09-05 NOTE — Progress Notes (Addendum)
Billy Roman [id number] 02-03-1961  CARE TEAM:  PCP: Camillia Herter, NP  Outpatient Care Team: Patient Care Team: Camillia Herter, NP as PCP - General (Nurse Practitioner) Lin Givens, RN as Oncology Nurse Navigator Ileana Roup, MD as Consulting Physician (General Surgery) Gatha Mayer, MD as Consulting Physician (Gastroenterology) Ladell Pier, MD as Consulting Physician (Oncology) Kyung Rudd, MD as Consulting Physician (Radiation Oncology)  Inpatient Treatment Team: Treatment Team: Attending Provider: Ileana Roup, MD; Attending Physician: Michael Boston, MD; Consulting Physician: Edison Pace, Md, MD; Technician: [place] , [place]; Charge Nurse: Cummings, [place] W, RN; Physical Therapist: Jacques Navy, PT; Registered Nurse: Rosemary Holms, Veatrice Bourbon, RN; Pharmacist: Adrian Saran, [facility]; Utilization Review: Micah Noel, RN   Problem List:   Principal Problem:   Rectal cancer [facility]) Active Problems:   Anxiety and depression   [name], recurrent severe, without psychosis ([place])   Complaints of memory disturbance   S/P robot-assisted surgical procedure   Ileostomy in place [facility])   Right inguinal hernia   Insomnia   Chronic pain   1 Day Post-Op  09/04/2022  POSTOP DIAGNOSIS: RECTAL CANCER   PROCEDURE:  Robotic assisted low anterior resection with diverting loop ileostomy Intraoperative assessment of perfusion using ICG fluorescence imaging Flexible sigmoidoscopy Bilateral transversus abdominus plane (TAP) blocks   SURGEON: Sharon Mt. White, MD  FINDINGS:  Empty right inguinal hernia. Tattoo in rectum with mass/annular scar proximal to it. No evident metastatic disease on visceral parietal peritoneum or liver. A well perfused, tension free, hemostatic, air tight 29 mm EEA olorectal anastomosis fashioned 10 cm from the anal verge by flexible sigmoidoscopy.    Assessment  Recovering relatively well so  far  [facility] Stay = 1 days)  Plan:  ERAS protocol  Stop IV fluids with as needed follow-up.  Follow-up in pathology.  Looks like he had a good response grossly.  Ileostomy care and training.  Drain serosanguineous.  Most likely remove prior to discharge unless there are changes.  Electrolytes stable.  Follow.  VTE prophylaxis- SCDs, etc  -mobilize as tolerated to help recovery  Disposition:  Disposition:  The patient is from: Home  Anticipate discharge to:  Home with Home Health  Anticipated Date of Discharge is:  March 24,2024   Barriers to discharge:  Consultant clearance & sign off    Patient currently is NOT MEDICALLY STABLE for discharge from the hospital from a surgery standpoint.      I reviewed nursing notes, last 24 h vitals and pain scores, last 48 h intake and output, last 24 h labs and trends, and last 24 h imaging results. I have reviewed this patient's available data, including medical history, events of note, test results, etc as part of my evaluation.  A significant portion of that time was spent in counseling.  Care during the described time interval was provided by me.  This care required moderate level of medical decision making.  09/05/2022    Subjective: (Chief complaint)  Had some soreness getting up.  Tolerating liquids.  Questions about surgery.  Objective:  Vital signs:  Vitals:   09/04/22 1932 09/04/22 2248 09/05/22 0325 09/05/22 0531  BP: 138/80 115/76 120/67   Pulse: 86 78 71   Resp: 18 18 18    Temp: (!) 97.4 F (36.3 C) (!) 97.5 F (36.4 C) 98.5 F (36.9 C)   TempSrc: Oral Oral    SpO2: 96% 93% 95%   Weight:  126.4 kg  Height:        Last BM Date : 09/04/22  Intake/Output   Yesterday:  03/20 0701 - 03/21 0700 In: 2780 [P.O.:600; I.V.:2080; IV Piggyback:100] Out: 2335 [Urine:2035; Drains:120; Blood:180] This shift:  No intake/output data recorded.  Bowel function:  Flatus: No  BM:  No  Drain:  Serosanguinous   Physical Exam:  General: Pt awake/alert in no acute distress Eyes: PERRL, normal EOM.  Sclera clear.  No icterus Neuro: CN II-XII intact w/o focal sensory/motor deficits. Lymph: No head/neck/groin lymphadenopathy Psych:  No delerium/psychosis/paranoia.  Oriented x 4 HENT: Normocephalic, Mucus membranes moist.  No thrush Neck: Supple, No tracheal deviation.  No obvious thyromegaly Chest: No pain to chest wall compression.  Good respiratory excursion.  No audible wheezing CV:  Pulses intact.  Regular rhythm.  No major extremity edema MS: Normal AROM mjr joints.  No obvious deformity  Abdomen: Soft.  Mildy distended.  Mildly tender at incisions only.  No evidence of peritonitis.  No incarcerated hernias.  Right-sided loop ileostomy pink.  No gas or succus in bag yet.  GU: Foley out.  Small right inguinal hernia reducible Ext:   No deformity.  No mjr edema.  No cyanosis Skin: No petechiae / purpurea.  No major sores.  Warm and dry    Results:   Cultures: No results found for this or any previous visit (from the past 720 hour(s)).  Labs: Results for orders placed or performed during the hospital encounter of 09/04/22 (from the past 48 hour(s))  ABO/Rh     Status: None   Collection Time: 09/04/22  7:20 AM  Result Value Ref Range   ABO/RH(D)      A POS Performed at [facility], [place] [address], [place], [place] [address]   CBC     Status: None   Collection Time: 09/05/22  4:29 AM  Result Value Ref Range   WBC 9.0 4.0 - 10.5 K/uL   RBC 4.45 4.22 - 5.81 MIL/uL   Hemoglobin 14.1 13.0 - 17.0 g/dL   HCT 42.9 39.0 - 52.0 %   MCV 96.4 80.0 - 100.0 fL   MCH 31.7 26.0 - 34.0 pg   MCHC 32.9 30.0 - 36.0 g/dL   RDW 12.1 11.5 - 15.5 %   Platelets 191 150 - 400 K/uL   nRBC 0.0 0.0 - 0.2 %    Comment: Performed at [facility], [place] [address], [place], [place] [id number]  Basic metabolic panel     Status: Abnormal   Collection Time:  09/05/22  4:29 AM  Result Value Ref Range   Sodium 137 135 - 145 mmol/L   Potassium 3.7 3.5 - 5.1 mmol/L   Chloride 102 98 - 111 mmol/L   CO2 26 22 - 32 mmol/L   Glucose, Bld 121 (H) 70 - 99 mg/dL    Comment: Glucose reference range applies only to samples taken after fasting for at least 8 hours.   BUN 12 6 - 20 mg/dL   Creatinine, Ser 0.88 0.61 - 1.24 mg/dL   Calcium 8.9 8.9 - 10.3 mg/dL   GFR, Estimated >60 >60 mL/min    Comment: (NOTE) Calculated using the CKD-EPI Creatinine Equation (2021)    Anion gap 9 5 - 15    Comment: Performed at [facility], [place] [address], [place], [place] [address]    Imaging / Studies: No results found.  Medications / Allergies: per chart  Antibiotics: Anti-infectives (From  admission, onward)    Start     Dose/Rate Route Frequency Ordered Stop   09/04/22 1400  neomycin (MYCIFRADIN) tablet 1,000 mg  Status:  Discontinued       See Hyperspace for full Linked Orders Report.   1,000 mg Oral 3 times per day 09/04/22 [id number] 09/04/22 0638   09/04/22 1400  metroNIDAZOLE (FLAGYL) tablet 1,000 mg  Status:  Discontinued       See Hyperspace for full Linked Orders Report.   1,000 mg Oral 3 times per day 09/04/22 [id number] 09/04/22 [id number]   09/04/22 0645  cefoTEtan (CEFOTAN) 2 g in sodium chloride 0.9 % 100 mL IVPB        2 g 200 mL/hr over 30 Minutes Intravenous On call to O.R. 09/04/22 [id number] 09/04/22 [id number]         Note: Portions of this report may have been transcribed using voice recognition software. Every effort was made to ensure accuracy; however, inadvertent computerized transcription errors may be present.   Any transcriptional errors that result from this process are unintentional.    Adin Hector, MD, FACS, MASCRS Esophageal, Gastrointestinal & Colorectal Surgery Robotic and Minimally Invasive Surgery  [place]. [address], [place], [place]  [address] [phone] Fax [phone] Main  CONTACT INFORMATION:  Weekday (9AM-5PM): Call [facility] main office at [phone]  Weeknight (5PM-9AM) or Weekend/Holiday: Check [email or web address] (password " [email or web address]") for General Surgery [facility] coverage  (Please, do not use SecureChat as it is not reliable communication to reach operating surgeons for immediate patient care given surgeries/outpatient duties/clinic/cross-coverage/off post-call which would lead to a delay in care.  Epic staff messaging available for outptient concerns, but may not be answered for 48 hours or more).     09/05/2022  7:36 AM

## 2022-09-05 NOTE — Discharge Instructions (Signed)
#######################################################  Ostomy Support Information  You've heard that people get along just fine with only one of their eyes, or one of their lungs, or one of their kidneys. But you also know that you have only one intestine and only one bladder, and that leaves you feeling awfully empty, both physically and emotionally: You think no other people go around without part of their intestine with the ends of their intestines sticking out through their abdominal walls.   YOU ARE NOT ALONE.  There are nearly three quarters of a million people in the [place] who have an ostomy; people who have had surgery to remove all or part of their colons or bladders.   There is even a national association, the [place] [employer] [place] with over 350 local affiliated support groups that are organized by volunteers who provide peer support and counseling. [employer] has a toll free telephone num-ber, [phone] and an educational, interactive website, www.ostomy.org   An ostomy is an opening in the belly (abdominal wall) made by surgery. Ostomates are people who have had this procedure. The opening (stoma) allows the kidney or bowel to grdischarge waste. An external pouch covers the stoma to collect waste. Pouches are are a simple bag and are odor free. Different companies have disposable or reusable pouches to fit one's lifestyle. An ostomy can either be temporary or permanent.   THERE ARE THREE MAIN TYPES OF OSTOMIES Colostomy. A colostomy is a surgically created opening in the large intestine (colon). Ileostomy. An ileostomy is a surgically created opening in the small intestine. Urostomy. A urostomy is a surgically created opening to divert urine away from the bladder.  OSTOMY Care  The following guidelines will make care of your colostomy easier. Keep this information close by for quick reference.  Helpful DIET hints Eat a well-balanced diet including vegetables and fresh  fruits. Eat on a regular schedule.  Drink at least 6 to 8 glasses of fluids daily. Eat slowly in a relaxed atmosphere. Chew your food thoroughly. Avoid chewing gum, smoking, and drinking from a straw. This will help decrease the amount of air you swallow, which may help reduce gas. Eating yogurt or drinking buttermilk may help reduce gas.  To control gas at night, do not eat after 8 p.m. This will give your bowel time to quiet down before you go to bed.  If gas is a problem, you can purchase Beano. Sprinkle Beano on the first bite of food before eating to reduce gas. It has no flavor and should not change the taste of your food. You can buy Beano over the counter at your local drugstore.  Foods like fish, onions, garlic, broccoli, asparagus, and cabbage produce odor. Although your pouch is odor-proof, if you eat these foods you may notice a stronger odor when emptying your pouch. If this is a concern, you may want to limit these foods in your diet.  If you have an ileostomy, you will have chronic diarrhea & need to drink more liquids to avoid getting dehydrated.  Consider antidiarrheal medicine like imodium (loperamide) or Lomotil to help slow down bowel movements / diarrhea into your ileostomy bag.  GETTING TO GOOD BOWEL HEALTH WITH AN ILEOSTOMY    With the colon bypassed & not in use, you will have small bowel diarrhea.   It is important to thicken & slow your bowel movements down.   The goal: 4-6 small BOWEL MOVEMENTS A DAY It is important to drink plenty of liquids to avoid  getting dehydrated  CONTROLLING ILEOSTOMY DIARRHEA  TAKE A FIBER SUPPLEMENT (FiberCon or Benefiner soluble fiber) twice a day - to thicken stools by absorbing excess fluid and retrain the intestines to act more normally.  Slowly increase the dose over a few weeks.  Too much fiber too soon can backfire and cause cramping & bloating.  TAKE AN IRON SUPPLEMENT twice a day to naturally constipate your bowels.  Usually  ferrous sulfate 326m twice a day)  TAKE ANTI-DIARRHEAL MEDICINES: Loperamide (Imodium) can slow down diarrhea.  Start with two tablets (= 4563m first and then try one tablet every 6 hours.  Can go up to 2 pills four times day (8 pills of 63m16max) Avoid if you are having fevers or severe pain.  If you are not better or start feeling worse, stop all medicines and call your doctor for advice LoMotil (Diphenoxylate / Atropine) is another medicine that can constipate & slow down bowel moevements Pepto Bismol (bismuth) can gently thicken bowels as well  If diarrhea is worse,: drink plenty of liquids and try simpler foods for a few days to avoid stressing your intestines further. Avoid dairy products (especially milk & ice cream) for a short time.  The intestines often can lose the ability to digest lactose when stressed. Avoid foods that cause gassiness or bloating.  Typical foods include beans and other legumes, cabbage, broccoli, and dairy foods.  Every person has some sensitivity to other foods, so listen to our body and avoid those foods that trigger problems for you.Call your doctor if you are getting worse or not better.  Sometimes further testing (cultures, endoscopy, X-ray studies, bloodwork, etc) may be needed to help diagnose and treat the cause of the diarrhea. Take extra anti-diarrheal medicines (maximum is 8 pills of 63mg66mperamide a day)   Tips for POUCHING an OSTOMY   Changing Your Pouch The best time to change your pouch is in the morning, before eating or drinking anything. Your stoma can function at any time, but it will function more after eating or drinking.   Applying the pouching system  Place all your equipment close at hand before removing your pouch.  Wash your hands.  Stand or sit in front of a mirror. Use the position that works best for you. Remember that you must keep the skin around the stoma wrinkle-free for a good seal.  Gently remove the used pouch (1-piece  system) or the pouch and old wafer (2-piece system). Empty the pouch into the toilet. Save the closure clip to use again.  Wash the stoma itself and the skin around the stoma. Your stoma may bleed a little when being washed. This is normal. Rinse and pat dry. You may use a wash cloth or soft paper towels (like Bounty), mild soap (like Dial, Safeguard, or IvorMongoliand water. Avoid soaps that contain perfumes or lotions.  For a new pouch (1-piece system) or a new wafer (2-piece system), measure your stoma using the stoma guide in each box of supplies.  Trace the shape of your stoma onto the back of the new pouch or the back of the new wafer. Cut out the opening. Remove the paper backing and set it aside.  Optional: Apply a skin barrier powder to surrounding skin if it is irritated (bare or weeping), and dust off the excess. Optional: Apply a skin-prep wipe (such as Skin Prep or All-Kare) to the skin around the stoma, and let it dry. Do not apply this solution if the  skin is irritated (red, tender, or broken) or if you have shaved around the stoma. Optional: Apply a skin barrier paste (such as Stomahesive, Coloplast, or Premium) around the opening cut in the back of the pouch or wafer. Allow it to dry for 30 to 60 seconds.  Hold the pouch (1-piece system) or wafer (2-piece system) with the sticky side toward your body. Make sure the skin around the stoma is wrinkle-free. Center the opening on the stoma, then press firmly to your abdomen (Fig. 4). Look in the mirror to check if you are placing the pouch, or wafer, in the right position. For a 2-piece system, snap the pouch onto the wafer. Make sure it snaps into place securely.  Place your hand over the stoma and the pouch or wafer for about 30 seconds. The heat from your hand can help the pouch or wafer stick to your skin.  Add deodorant (such as Super Banish or Nullo) to your pouch. Other options include food extracts such as vanilla oil and peppermint  extract. Add about 10 drops of the deodorant to the pouch. Then apply the closure clamp. Note: Do not use toxic  chemicals or commercial cleaning agents in your pouch. These substances may harm the stoma.  Optional: For extra seal, apply tape to all 4 sides around the pouch or wafer, as if you were framing a picture. You may use any brand of medical adhesive tape. Change your pouch every 5 to 7 days. Change it immediately if a leak occurs.  Wash your hands afterwards.  If you are wearing a 2-piece system, you may use 2 new pouches per week and alternate them. Rinse the pouch with mild soap and warm water and hang it to dry for the next day. Apply the fresh pouch. Alternate the 2 pouches like this for a week. After a week, change the wafer and begin with 2 new pouches. Place the old pouches in a plastic bag, and put them in the trash.   LIVING WITH AN OSTOMY  Emptying Your Pouch Empty your pouch when it is one-third full (of urine, stool, and/or gas). If you wait until your pouch is fuller than this, it will be more difficult to empty and more noticeable. When you empty your pouch, either put toilet paper in the toilet bowl first, or flush the toilet while you empty the pouch. This will reduce splashing. You can empty the pouch between your legs or to one side while sitting, or while standing or stooping. If you have a 2-piece system, you can snap off the pouch to empty it. Remember that your stoma may function during this time. If you wish to rinse your pouch after you empty it, a [place] baster can be helpful. When using a baster, squirt water up into the pouch through the opening at the bottom. With a 2-piece system, you can snap off the pouch to rinse it. After rinsing  your pouch, empty it into the toilet. When rinsing your pouch at home, put a few granules of Dreft soap in the rinse water. This helps lubricate and freshen your pouch. The inside of your pouch can be sprayed with non-stick cooking  oil (Pam spray). This may help reduce stool sticking to the inside of the pouch.  Bathing You may shower or bathe with your pouch on or off. Remember that your stoma may function during this time.  The materials you use to wash your stoma and the skin around it should be  clean, but they do not need to be sterile.  Wearing Your Pouch During hot weather, or if you perspire a lot in general, wear a cover over your pouch. This may prevent a rash on your skin under the pouch. Pouch covers are sold at ostomy supply stores. Wear the pouch inside your underwear for better support. Watch your weight. Any gain or loss of 10 to 15 pounds or more can change the way your pouch fits.  Going Away From Home A collapsible cup (like those that come in travel kits) or a soft plastic squirt bottle with a pull-up top (like a travel bottle for shampoo) can be used for rinsing your pouch when you are away from home. Tilt the opening of the pouch at an upward angle when using a cup to rinse.  Carry wet wipes or extra tissues to use in public bathrooms.  Carry an extra pouching system with you at all times.  Never keep ostomy supplies in the glove compartment of your car. Extreme heat or cold can damage the skin barriers and adhesive wafers on the pouch.  When you travel, carry your ostomy supplies with you at all times. Keep them within easy reach. Do not pack ostomy supplies in baggage that will be checked or otherwise separated from you, because your baggage might be lost. If you're traveling out of the country, it is helpful to have a letter stating that you are carrying ostomy supplies as a medical necessity.  If you need ostomy supplies while traveling, look in the yellow pages of the telephone book under "Surgical Supplies." Or call the local ostomy organization to find out where supplies are available.  Do not let your ostomy supplies get low. Always order new pouches before you use the last one.  Reducing  Odor Limit foods such as broccoli, cabbage, onions, fish, and garlic in your diet to help reduce odor. Each time you empty your pouch, carefully clean the opening of the pouch, both inside and outside, with toilet paper. Rinse your pouch 1 or 2 times daily after you empty it (see directions for emptying your pouch and going away from home). Add deodorant (such as Super Banish or Nullo) to your pouch. Use air deodorizers in your bathroom. Do not add aspirin to your pouch. Even though aspirin can help prevent odor, it could cause ulcers on your stoma.  When to call the doctor Call the doctor if you have any of the following symptoms: Purple, black, or white stoma Severe cramps lasting more than 6 hours Severe watery discharge from the stoma lasting more than 6 hours No output from the colostomy for 3 days Excessive bleeding from your stoma Swelling of your stoma to more than 1/2-inch larger than usual Pulling inward of your stoma below skin level Severe skin irritation or deep ulcers Bulging or other changes in your abdomen  When to call your ostomy nurse Call your ostomy/enterostomal therapy (WOCN) nurse if any of the following occurs: Frequent leaking of your pouching system Change in size or appearance of your stoma, causing discomfort or problems with your pouch Skin rash or rawness Weight gain or loss that causes problems with your pouch     FREQUENTLY ASKED QUESTIONS   Why haven't you met any of these folks who have an ostomy?  Well, maybe you have! You just did not recognize them because an ostomy doesn't show. It can be kept secret if you wish. Why, maybe some of your best friends, office  associates or neighbors have an ostomy ... you never can tell. People facing ostomy surgery have many quality-of-life questions like: Will you bulge? Smell? Make noises? Will you feel waste leaving your body? Will you be a captive of the toilet? Will you starve? Be a social outcast? Get/stay  married? Have babies? Easily bathe, go swimming, bend over?  OK, let's look at what you can expect:   Will you bulge?  Remember, without part of the intestine or bladder, and its contents, you should have a flatter tummy than before. You can expect to wear, with little exception, what you wore before surgery ... and this in-cludes tight clothing and bathing suits.   Will you smell?  Today, thanks to modern odor proof pouching systems, you can walk into an ostomy support group meeting and not smell anything that is foul or offensive. And, for those with an ileostomy or colostomy who are concerned about odor when emptying their pouch, there are in-pouch deodorants that can be used to eliminate any waste odors that may exist.   Will you make noises?  Everyone produces gas, especially if they are an air-swallower. But intestinal sounds that occur from time to time are no differ-ent than a gurgling tummy, and quite often your clothing will muffle any sounds.   Will you feel the waste discharges?  For those with a colostomy or ileostomy there might be a slight pressure when waste leaves your body, but understand that the intestines have no nerve endings, so there will be no unpleasant sensations. Those with a urostomy will probably be unaware of any kidney drainage.   Will you be a captive of the toilet?  Immediately post-op you will spend more time in the bathroom than you will after your body recovers from surgery. Every person is different, but on average those with an ileostomy or urostomy may empty their pouches 4 to 6 times a day; a little  less if you have a colostomy. The average wear time between pouch system changes is 3 to 5 days and the changing process should take less than 30 minutes.   Will I need to be on a special diet? Most people return to their normal diet when they have recovered from surgery. Be sure to chew your food well, eat a well-balanced diet and drink plenty of fluids. If  you experience problems with a certain food, wait a couple of weeks and try it again.  Will there be odor and noises? Pouching systems are designed to be odor-proof or odor-resistant. There are deodorants that can be used in the pouch. Medications are also available to help reduce odor. Limit gas-producing foods and carbonated beverages. You will experience less gas and fewer noises as you heal from surgery.  How much time will it take to care for my ostomy? At first, you may spend a lot of time learning about your ostomy and how to take care of it. As you become more comfortable and skilled at changing the pouching system, it will take very little time to care for it.   Will I be able to return to work? People with ostomies can perform most jobs. As soon as you have healed from surgery, you should be able to return to work. Heavy lifting (more than 10 pounds) may be discouraged.   What about intimacy? Sexual relationships and intimacy are important and fulfilling aspects of your life. They should continue after ostomy surgery. Intimacy-related concerns should be discussed openly between you and  your partner.   Can I wear regular clothing? You do not need to wear special clothing. Ostomy pouches are fairly flat and barely noticeable. Elastic undergarments will not hurt the stoma or prevent the ostomy from functioning.   Can I participate in sports? An ostomy should not limit your involvement in sports. Many people with ostomies are runners, skiers, swimmers or participate in other active lifestyles. Talk with your caregiver first before doing heavy physical activity.  Will you starve?  Not if you follow doctor's orders at each stage of your post-op adjustment. There is no such thing as an "ostomy diet". Some people with an ostomy will be able to eat and tolerate anything; others may find diffi-culty with some foods. Each person is an individual and must determine, by trial, what is best for  them. A good practice for all is to drink plenty of water.   Will you be a social outcast?  Have you met anyone who has an ostomy and is a social outcast? Why should you be the first? Only your attitude and self image will effect how you are treated. No confi-dent person is an Occupational psychologist.    PROFESSIONAL HELP   Resources are available if you need help or have questions about your ostomy.   Specially trained nurses called Wound, Ostomy Continence Nurses (WOCN) are available for consultation in most major medical centers.  Consider getting an ostomy consult at an outpatient ostomy clinic.   [facility] has an [facility] run by an Programmer, systems at the [place].  [phone]. [facility] can help set up an appointment   The [employer] (UOA) is a group made up of many local chapters throughout the [place]. These local groups hold meetings and provide support to prospective and existing ostomates. They sponsor educational events and have qualified visitors to make personal or telephone visits. Contact the UOA for the chapter nearest you and for other educational publications.  More detailed information can be found in Colostomy Guide, a publication of the [employer] (UOA). Contact UOA at [phone] or visit their web site at [email or web address] The website contains links to other sites, suppliers and resources.  Tree surgeon Start Services: Start at the website to enlist for support.  Your Wound Ostomy (WOCN) nurse may have started this process. [email or web address]://[email or web address].hollister.com/en/securestart Secure Start services are designed to support people as they live their lives with an ostomy or neurogenic bladder. Enrolling is easy and at no cost to the patient. We realize that each person's needs and life journey are different. Through Secure Start services, we want to help people live their life, their  way.  #######################################################   SURGERY: POST OP INSTRUCTIONS (Surgery for small bowel obstruction, colon resection, etc)   ######################################################################  EAT Gradually transition to a high fiber diet with a fiber supplement over the next few days after discharge  WALK Walk an hour a day.  Control your pain to do that.    CONTROL PAIN Control pain so that you can walk, sleep, tolerate sneezing/coughing, go up/down stairs.  HAVE A BOWEL MOVEMENT DAILY Keep your bowels regular to avoid problems.  OK to try a laxative to override constipation.  OK to use an antidairrheal to slow down diarrhea.  Call if not better after 2 tries  CALL IF YOU HAVE PROBLEMS/CONCERNS Call if you are still struggling despite following these instructions. Call if you have concerns not answered by these instructions  ######################################################################  DIET Follow a light diet the first few days at home.  Start with a bland diet such as soups, liquids, starchy foods, low fat foods, etc.  If you feel full, bloated, or constipated, stay on a ful liquid or pureed/blenderized diet for a few days until you feel better and no longer constipated. Be sure to drink plenty of fluids every day to avoid getting dehydrated (feeling dizzy, not urinating, etc.). Gradually add a fiber supplement to your diet over the next week.  Gradually get back to a regular solid diet.  Avoid fast food or heavy meals the first week as you are more likely to get nauseated. It is expected for your digestive tract to need a few months to get back to normal.  It is common for your bowel movements and stools to be irregular.  You will have occasional bloating and cramping that should eventually fade away.  Until you are eating solid food normally, off all pain medications, and back to regular activities; your bowels will not be  normal. Focus on eating a low-fat, high fiber diet the rest of your life (See Getting to [place], below).  CARE of your INCISION or WOUND  It is good for closed incisions and even open wounds to be washed every day.  Shower every day.  Short baths are fine.  Wash the incisions and wounds clean with soap & water.    You may leave closed incisions open to air if it is dry.   You may cover the incision with clean gauze & replace it after your daily shower for comfort.  DERMABOND:  You have purple skin glue (Dermabond) on your incision(s).  Leave them in place, and they will fall off on their own like a scab in 2-3 weeks.  You may trim any edges that curl up with clean scissors.    If you have an open wound with a wound vac, see wound vac care instructions.    ACTIVITIES as tolerated Start light daily activities --- self-care, walking, climbing stairs-- beginning the day after surgery.  Gradually increase activities as tolerated.  Control your pain to be active.  Stop when you are tired.  Ideally, walk several times a day, eventually an hour a day.   Most people are back to most day-to-day activities in a few weeks.  It takes 4-8 weeks to get back to unrestricted, intense activity. If you can walk 30 minutes without difficulty, it is safe to try more intense activity such as jogging, treadmill, bicycling, low-impact aerobics, swimming, etc. Save the most intensive and strenuous activity for last (Usually 4-8 weeks after surgery) such as sit-ups, heavy lifting, contact sports, etc.  Refrain from any intense heavy lifting or straining until you are off narcotics for pain control.  You will have off days, but things should improve week-by-week. DO NOT PUSH THROUGH PAIN.  Let pain be your guide: If it hurts to do something, don't do it.  Pain is your body warning you to avoid that activity for another week until the pain goes down. You may drive when you are no longer taking narcotic  prescription pain medication, you can comfortably wear a seatbelt, and you can safely make sudden turns/stops to protect yourself without hesitating due to pain. You may have sexual intercourse when it is comfortable. If it hurts to do something, stop.  MEDICATIONS Take your usually prescribed home medications unless otherwise directed.   Blood thinners:  Usually you can restart any  strong blood thinners after the second postoperative day.  It is OK to take aspirin right away.     If you are on strong blood thinners (warfarin/Coumadin, Plavix, Xerelto, Eliquis, Pradaxa, etc), discuss with your surgeon, medicine PCP, and/or cardiologist for instructions on when to restart the blood thinner & if blood monitoring is needed (PT/INR blood check, etc).     PAIN CONTROL Pain after surgery or related to activity is often due to strain/injury to muscle, tendon, nerves and/or incisions.  This pain is usually short-term and will improve in a few months.  To help speed the process of healing and to get back to regular activity more quickly, DO THE FOLLOWING THINGS TOGETHER: Increase activity gradually.  DO NOT PUSH THROUGH PAIN Use Ice and/or Heat Try Gentle Massage and/or Stretching Take over the counter pain medication Take Narcotic prescription pain medication for more severe pain  Good pain control = faster recovery.  It is better to take more medicine to be more active than to stay in bed all day to avoid medications.  Increase activity gradually Avoid heavy lifting at first, then increase to lifting as tolerated over the next 6 weeks. Do not "push through" the pain.  Listen to your body and avoid positions and maneuvers than reproduce the pain.  Wait a few days before trying something more intense Walking an hour a day is encouraged to help your body recover faster and more safely.  Start slowly and stop when getting sore.  If you can walk 30 minutes without stopping or pain, you can try more  intense activity (running, jogging, aerobics, cycling, swimming, treadmill, sex, sports, weightlifting, etc.) Remember: If it hurts to do it, then don't do it! Use Ice and/or Heat You will have swelling and bruising around the incisions.  This will take several weeks to resolve. Ice packs or heating pads (6-8 times a day, 30-60 minutes at a time) will help sooth soreness & bruising. Some people prefer to use ice alone, heat alone, or alternate between ice & heat.  Experiment and see what works best for you.  Consider trying ice for the first few days to help decrease swelling and bruising; then, switch to heat to help relax sore spots and speed recovery. Shower every day.  Short baths are fine.  It feels good!  Keep the incisions and wounds clean with soap & water.   Try Gentle Massage and/or Stretching Massage at the area of pain many times a day Stop if you feel pain - do not overdo it Take over the counter pain medication This helps the muscle and nerve tissues become less irritable and calm down faster Choose ONE of the following over-the-counter anti-inflammatory medications: Acetaminophen 582m tabs (Tylenol) 1-2 pills with every meal and just before bedtime (avoid if you have liver problems or if you have acetaminophen in you narcotic prescription) Naproxen 2271mtabs (ex. Aleve, Naprosyn) 1-2 pills twice a day (avoid if you have kidney, stomach, IBD, or bleeding problems) Ibuprofen 20015mabs (ex. Advil, Motrin) 3-4 pills with every meal and just before bedtime (avoid if you have kidney, stomach, IBD, or bleeding problems) Take with food/snack several times a day as directed for at least 2 weeks to help keep pain / soreness down & more manageable. Take Narcotic prescription pain medication for more severe pain A prescription for strong pain control is often given to you upon discharge (for example: oxycodone/Percocet, hydrocodone/Norco/Vicodin, or tramadol/Ultram) Take your pain medication  as prescribed. Be mindful  that most narcotic prescriptions contain Tylenol (acetaminophen) as well - avoid taking too much Tylenol. If you are having problems/concerns with the prescription medicine (does not control pain, nausea, vomiting, rash, itching, etc.), please call us [phone] to see if we need to switch you to a different pain medicine that will work better for you and/or control your side effects better. If you need a refill on your pain medication, you must call the office before 4 pm and on weekdays only.  By federal law, prescriptions for narcotics cannot be called into a pharmacy.  They must be filled out on paper & picked up from our office by the patient or authorized caretaker.  Prescriptions cannot be filled after 4 pm nor on weekends.    WHEN TO CALL us [phone] Severe uncontrolled or worsening pain  Fever over 101 F (38.5 C) Concerns with the incision: Worsening pain, redness, rash/hives, swelling, bleeding, or drainage Reactions / problems with new medications (itching, rash, hives, nausea, etc.) Nausea and/or vomiting Difficulty urinating Difficulty breathing Worsening fatigue, dizziness, lightheadedness, blurred vision Other concerns If you are not getting better after two weeks or are noticing you are getting worse, contact our office [phone] for further advice.  We may need to adjust your medications, re-evaluate you in the office, send you to the emergency room, or see what other things we can do to help. The clinic staff is available to answer your questions during regular business hours (8:30am-5pm).  Please don't hesitate to call and ask to speak to one of our nurses for clinical concerns.    A surgeon from [facility] is always on call at the hospitals 24 hours/day If you have a medical emergency, go to the nearest emergency room or call 911.  FOLLOW UP in our office One the day of your discharge from the hospital (or the next  business weekday), please call [facility] to set up or confirm an appointment to see your surgeon in the office for a follow-up appointment.  Usually it is 2-3 weeks after your surgery.   If you have skin staples at your incision(s), let the office know so we can set up a time in the office for the nurse to remove them (usually around 10 days after surgery). Make sure that you call for appointments the day of discharge (or the next business weekday) from the hospital to ensure a convenient appointment time. IF YOU HAVE DISABILITY OR FAMILY LEAVE FORMS, BRING THEM TO THE OFFICE FOR PROCESSING.  DO NOT GIVE THEM TO YOUR DOCTOR.  [facility] [address], [place], [place], [place]  [address] ? [phone] - Main [phone] - [place],  [phone] - Fax [email or web address]    GETTING TO GOOD BOWEL HEALTH. It is expected for your digestive tract to need a few months to get back to normal.  It is common for your bowel movements and stools to be irregular.  You will have occasional bloating and cramping that should eventually fade away.  Until you are eating solid food normally, off all pain medications, and back to regular activities; your bowels will not be normal.   Avoiding constipation The goal: ONE SOFT BOWEL MOVEMENT A DAY!    Drink plenty of fluids.  Choose water first. TAKE A FIBER SUPPLEMENT EVERY DAY THE REST OF YOUR LIFE During your first week back home, gradually add back a fiber supplement every day Experiment which form you can tolerate.  There are many forms such as powders, tablets, wafers, gummies, etc Psyllium bran (Metamucil), methylcellulose (Citrucel), Miralax or Glycolax, Benefiber, Flax Seed.  Adjust the dose week-by-week (1/2 dose/day to 6 doses a day) until you are moving your bowels 1-2 times a day.  Cut back the dose or try a different fiber product if it is giving you problems such as diarrhea or  bloating. Sometimes a laxative is needed to help jump-start bowels if constipated until the fiber supplement can help regulate your bowels.  If you are tolerating eating & you are farting, it is okay to try a gentle laxative such as double dose MiraLax, prune juice, or Milk of Magnesia.  Avoid using laxatives too often. Stool softeners can sometimes help counteract the constipating effects of narcotic pain medicines.  It can also cause diarrhea, so avoid using for too long. If you are still constipated despite taking fiber daily, eating solids, and a few doses of laxatives, call our office. Controlling diarrhea Try drinking liquids and eating bland foods for a few days to avoid stressing your intestines further. Avoid dairy products (especially milk & ice cream) for a short time.  The intestines often can lose the ability to digest lactose when stressed. Avoid foods that cause gassiness or bloating.  Typical foods include beans and other legumes, cabbage, broccoli, and dairy foods.  Avoid greasy, spicy, fast foods.  Every person has some sensitivity to other foods, so listen to your body and avoid those foods that trigger problems for you. Probiotics (such as active yogurt, Align, etc) may help repopulate the intestines and colon with normal bacteria and calm down a sensitive digestive tract Adding a fiber supplement gradually can help thicken stools by absorbing excess fluid and retrain the intestines to act more normally.  Slowly increase the dose over a few weeks.  Too much fiber too soon can backfire and cause cramping & bloating. It is okay to try and slow down diarrhea with a few doses of antidiarrheal medicines.   Bismuth subsalicylate (ex. Kayopectate, Pepto Bismol) for a few doses can help control diarrhea.  Avoid if pregnant.   Loperamide (Imodium) can slow down diarrhea.  Start with one tablet (75m) first.  Avoid if you are having fevers or severe pain.  ILEOSTOMY PATIENTS WILL HAVE CHRONIC  DIARRHEA since their colon is not in use.    Drink plenty of liquids.  You will need to drink even more glasses of water/liquid a day to avoid getting dehydrated. Record output from your ileostomy.  Expect to empty the bag every 3-4 hours at first.  Most people with a permanent ileostomy empty their bag 4-6 times at the least.   Use antidiarrheal medicine (especially Imodium) several times a day to avoid getting dehydrated.  Start with a dose at bedtime & breakfast.  Adjust up or down as needed.  Increase antidiarrheal medications as directed to avoid emptying the bag more than 8 times a day (every 3 hours). Work with your wound ostomy nurse to learn care for your ostomy.  See ostomy care instructions. TROUBLESHOOTING IRREGULAR BOWELS 1) Start with a soft & bland diet. No spicy, greasy, or fried foods.  2) Avoid gluten/wheat or dairy products from diet to see if symptoms improve. 3) Miralax 17gm or flax seed mixed in 8Maben water or juice-daily. May use 2-4 times a day as needed. 4) Gas-X, Phazyme, etc. as needed for gas & bloating.  5) Prilosec (omeprazole) over-the-counter as needed 6)  Consider probiotics (Align, Activa,  etc) to help calm the bowels down  Call your doctor if you are getting worse or not getting better.  Sometimes further testing (cultures, endoscopy, X-ray studies, CT scans, bloodwork, etc.) may be needed to help diagnose and treat the cause of the diarrhea. [facility], [place], [place], [place], [place]  [address] [phone] - Main.    [phone]  - Toll Free.   [phone] - Fax [email or web address]

## 2022-09-05 NOTE — Evaluation (Signed)
Occupational Therapy Evaluation Patient Details Name: Billy Roman MRN: [id number] DOB: 1961/01/03 Today's Date: 09/05/2022   History of Present Illness 62 yo m adm 3/20 for scheduled Robotic assisted low anterior resection with diverting loop ileostomy.  PMH includes CA, Depression, Anxiety, Sleep Apnea.   Clinical Impression   Patient admitted for the procedure above.  PTA he lives alone, and needed no assist with ADL, iADL or mobility.  Patient's daughter can help on a PRN basis for groceries.  Currently he is needing generalized supervision for ADL and mobility in the room.  Primary deficit is abdominal pain and a little unsteadiness.  OT is indicated in the acute setting to address deficits, and assist with transition to next level of care.  No post acute OT is anticipated.        Recommendations for follow up therapy are one component of a multi-disciplinary discharge planning process, led by the attending physician.  Recommendations may be updated based on patient status, additional functional criteria and insurance authorization.   Follow Up Recommendations  No OT follow up     Assistance Recommended at Discharge PRN  Patient can return home with the following Assist for transportation;Assistance with cooking/housework    Functional Status Assessment  Patient has had a recent decline in their functional status and demonstrates the ability to make significant improvements in function in a reasonable and predictable amount of time.  Equipment Recommendations  None recommended by OT    Recommendations for Other Services       Precautions / Restrictions Precautions Precautions: Fall Precaution Comments: JP Drain Restrictions Weight Bearing Restrictions: No      Mobility Bed Mobility Overal bed mobility: Needs Assistance Bed Mobility: Sidelying to Sit, Sit to Supine   Sidelying to sit: Supervision   Sit to supine: Supervision   General bed mobility comments:  Increased time    Transfers Overall transfer level: Needs assistance   Transfers: Sit to/from Stand Sit to Stand: Supervision                  Balance Overall balance assessment: Needs assistance Sitting-balance support: Feet supported Sitting balance-Leahy Scale: Good     Standing balance support: No upper extremity supported Standing balance-Leahy Scale: Fair                             ADL either performed or assessed with clinical judgement   ADL       Grooming: Oral care;Set up;Standing               Lower Body Dressing: Supervision/safety;Sit to/from Archivist: Supervision/safety;Regular Toilet                   Vision Patient Visual Report: No change from baseline       Perception     Praxis      Pertinent Vitals/Pain Pain Assessment Pain Assessment: Faces Faces Pain Scale: Hurts even more Pain Location: Incisional Pain Descriptors / Indicators: Grimacing, Guarding Pain Intervention(s): Monitored during session, Patient requesting pain meds-RN notified     Hand Dominance Right   Extremity/Trunk Assessment Upper Extremity Assessment Upper Extremity Assessment: Overall WFL for tasks assessed   Lower Extremity Assessment Lower Extremity Assessment: Defer to PT evaluation   Cervical / Trunk Assessment Cervical / Trunk Assessment: Other exceptions Cervical / Trunk Exceptions: abdominal surgery   Communication Communication Communication: No difficulties   Cognition Arousal/Alertness: Awake/alert Behavior  During Therapy: WFL for tasks assessed/performed Overall Cognitive Status: Within Functional Limits for tasks assessed                                       General Comments   VSS on RA    Exercises     Shoulder Instructions      Home Living Family/patient expects to be discharged to:: Private residence Living Arrangements: Alone Available Help at Discharge:  Family;Friend(s);Available PRN/intermittently Type of Home: House Home Access: Stairs to enter [employer] of Steps: 2   Home Layout: Two level;Bed/bath upstairs Alternate Level Stairs-Number of Steps: 8 or 9 Alternate Level Stairs-Rails: Left Bathroom Shower/Tub: Teacher, early years/pre: Standard Bathroom Accessibility: Yes How Accessible: Accessible via walker Home Equipment: None          Prior Functioning/Environment Prior Level of Function : Independent/Modified Independent;Driving                        OT Problem List: Pain      OT Treatment/Interventions: Self-care/ADL training;Therapeutic activities;Patient/family education;Balance training    OT Goals(Current goals can be found in the care plan section) Acute Rehab OT Goals Patient Stated Goal: Return home OT Goal Formulation: With patient Time For Goal Achievement: 09/19/22 Potential to Achieve Goals: Good ADL Goals Pt Will Perform Lower Body Dressing: Independently;sit to/from stand Pt Will Transfer to Toilet: Independently;ambulating;regular height toilet  OT Frequency: Min 2X/week    Co-evaluation              AM-PAC OT "6 Clicks" Daily Activity     Outcome Measure Help from another person eating meals?: None Help from another person taking care of personal grooming?: None Help from another person toileting, which includes using toliet, bedpan, or urinal?: A Little Help from another person bathing (including washing, rinsing, drying)?: A Little Help from another person to put on and taking off regular upper body clothing?: A Little Help from another person to put on and taking off regular lower body clothing?: A Little 6 Click Score: 20   End of Session Nurse Communication: Patient requests pain meds  Activity Tolerance: Patient tolerated treatment well Patient left: in bed;with call bell/phone within reach;with bed alarm set  OT Visit Diagnosis: Unsteadiness on  feet (R26.81)                [id number] OT Time Calculation (min): 22 min Charges:  OT General Charges $OT Visit: 1 Visit OT Evaluation $OT Eval Moderate Complexity: 1 Mod  09/05/2022  RP, OTR/L  Acute Rehabilitation Services  Office:  [phone]   Metta Clines 09/05/2022, 9:07 AM

## 2022-09-05 NOTE — Anesthesia Postprocedure Evaluation (Signed)
Anesthesia Post Note  Patient: Billy Roman  Procedure(s) Performed: ROBOTIC LOW ANTERIOR RESECTION, BILATERAL TAP BLOCK, AND INOPERATIVE ASSESSMENT OF PERFUSION USING FIREFLY DYE LOOP ILEOSTOMY [place] SIGMOIDOSCOPY     Patient location during evaluation: PACU Anesthesia Type: General Level of consciousness: awake and alert Pain management: pain level controlled Vital Signs Assessment: post-procedure vital signs reviewed and stable Respiratory status: spontaneous breathing, nonlabored ventilation, respiratory function stable and patient connected to nasal cannula oxygen Cardiovascular status: blood pressure returned to baseline and stable Postop Assessment: no apparent nausea or vomiting Anesthetic complications: no  No notable events documented.  Last Vitals:  Vitals:   09/05/22 0927 09/05/22 0957  BP:    Pulse:    Resp: 20 19  Temp:    SpO2:      Last Pain:  Vitals:   09/05/22 0957  TempSrc:   PainSc: 3                  Aashvi Rezabek S

## 2022-09-06 LAB — CBC
HCT: 40.6 % (ref 39.0–52.0)
Hemoglobin: 13.3 g/dL (ref 13.0–17.0)
MCH: 31.4 pg (ref 26.0–34.0)
MCHC: 32.8 g/dL (ref 30.0–36.0)
MCV: 95.8 fL (ref 80.0–100.0)
Platelets: 163 10*3/uL (ref 150–400)
RBC: 4.24 MIL/uL (ref 4.22–5.81)
RDW: 12.3 % (ref 11.5–15.5)
WBC: 5.9 10*3/uL (ref 4.0–10.5)
nRBC: 0 % (ref 0.0–0.2)

## 2022-09-06 LAB — BASIC METABOLIC PANEL
Anion gap: 7 (ref 5–15)
BUN: 13 mg/dL (ref 6–20)
CO2: 24 mmol/L (ref 22–32)
Calcium: 8.2 mg/dL — ABNORMAL LOW (ref 8.9–10.3)
Chloride: 103 mmol/L (ref 98–111)
Creatinine, Ser: 0.65 mg/dL (ref 0.61–1.24)
GFR, Estimated: >60 mL/min (ref >60)
Glucose, Bld: 100 mg/dL — ABNORMAL HIGH (ref 70–99)
Potassium: 4 mmol/L (ref 3.5–5.1)
Sodium: 134 mmol/L — ABNORMAL LOW (ref 135–145)

## 2022-09-06 MED ORDER — LOPERAMIDE HCL 2 MG PO CAPS
2.0000 mg | ORAL_CAPSULE | Freq: Three times a day (TID) | ORAL | Status: DC
  Administered 2022-09-06 – 2022-09-08 (×12): 2 mg via ORAL
  Filled 2022-09-06 (×13): qty 1

## 2022-09-06 MED ORDER — OXYCODONE HCL 5 MG PO TABS
5.0000 mg | ORAL_TABLET | Freq: Four times a day (QID) | ORAL | Status: DC | PRN
  Administered 2022-09-06 – 2022-09-11 (×18): 10 mg via ORAL
  Filled 2022-09-06 (×19): qty 2

## 2022-09-06 NOTE — TOC Initial Note (Addendum)
Transition of Care [facility]) - Initial/Assessment Note   Patient Details  Name: Billy Roman MRN: [id number] Date of Birth: 1960-10-23  [facility] CM/SW Contact:    Sherie Don, LCSW Phone Number: 09/06/2022, 2:16 PM  Clinical Narrative: PT evaluation recommended HHPT. Per chart review, ambulatory referral was made to the ostomy clinic. Patient is agreeable to HHPT referral. CSW made [facility] referrals to the following agencies:  [facility]: out-of-network [facility]: declined [facility]: declined: [facility]: no response [facility]: accepted, but can take for HHPT only as there is no nursing availability  CSW updated patient regarding HHPT being set up and is aware CSW was unable to secure [facility]. Patient agreeable to food pantry resources being added to AVS. Patient reported his car has been repaired and he uses transportation through the cancer center for his medical appointments. SDOH information updated.  Expected Discharge Plan: [place] Barriers to Discharge: Continued Medical Work up  Patient Goals and CMS Choice Patient states their goals for this hospitalization and ongoing recovery are:: Get better CMS Medicare.gov Compare Post Acute Care list provided to:: Patient Choice offered to / list presented to : Patient  Expected Discharge Plan and Services In-house Referral: Clinical Social Work Post Acute Care Choice: [place] arrangements for the past 2 months: [place]: PT [place]: [place] Date [place]: 09/06/22 Representative spoke with at [place]: Amy  Prior Living Arrangements/Services Living arrangements for the past 2 months: [place] with:: Self Patient language and need for interpreter reviewed:: Yes Do you feel safe going back to the place where you live?: Yes      Need for Family Participation in Patient Care: No (Comment) Care giver support system in place?: Yes  (comment) Criminal Activity/Legal Involvement Pertinent to Current Situation/Hospitalization: No - Comment as needed  Activities of Daily Living Home Assistive Devices/Equipment: None ADL Screening (condition at time of admission) Patient's cognitive ability adequate to safely complete daily activities?: Yes Is the patient deaf or have difficulty hearing?: No Does the patient have difficulty seeing, even when wearing glasses/contacts?: No Does the patient have difficulty concentrating, remembering, or making decisions?: Yes Patient able to express need for assistance with ADLs?: Yes Does the patient have difficulty dressing or bathing?: No Independently performs ADLs?: Yes (appropriate for developmental age) Does the patient have difficulty walking or climbing stairs?: Yes Weakness of Legs: None Weakness of Arms/Hands: None  Permission Sought/Granted Permission sought to share information with : Other (comment) Permission granted to share information with : Yes, Verbal Permission Granted Permission granted to share info w AGENCY: [place] agencies  Emotional Assessment Appearance:: Appears stated age Attitude/Demeanor/Rapport: Engaged Affect (typically observed): Accepting Orientation: : Oriented to Self, Oriented to Place, Oriented to  Time, Oriented to Situation  Admission diagnosis:  S/P robot-assisted surgical procedure [Z98.890] Patient Active Problem List   Diagnosis Date Noted   Right inguinal hernia 09/05/2022   Insomnia 09/05/2022   Chronic pain 09/05/2022   S/P robot-assisted surgical procedure 09/04/2022   Ileostomy in place [facility]) 09/04/2022   Rectal cancer ([place]) 11/20/2021   Cognitive impairment 09/18/2021   Confusion 09/18/2021   Tremor 09/18/2021   Complaints of memory disturbance 08/24/2021   Tremor of both hands 08/24/2021   H/O rotator cuff surgery 08/24/2021   History of COVID-19 08/24/2021   Smoker 08/24/2021   [name], recurrent  severe, without psychosis ([place]) 08/23/2021   Stiffness of right shoulder joint  02/09/2021   Pain in joint of right shoulder 02/09/2021   Vitamin D deficiency 01/17/2021   HNP (herniated nucleus pulposus), lumbar 01/15/2021   Sciatica 01/15/2021   Anxiety and depression 08/23/2009   PCP:  Camillia Herter, NP Pharmacy:   [id number] - [place], [address] [place] [address] Phone: [phone] Fax: [phone]  [place] [address] Phone: [phone] Fax: [phone]  [place], [place] [address] Phone: [phone] Fax: [phone]  Social Determinants of Health (SDOH) Social History: [place]: Food Insecurity Present (09/04/2022)  Housing: Low Risk  (09/04/2022)  Transportation Needs: Unmet Transportation Needs (09/04/2022)  Utilities: Not At Risk (09/04/2022)  Alcohol Screen: Low Risk  (08/23/2021)  Depression (PHQ2-9): High Risk (08/23/2021)  Financial Resource Strain: Medium Risk (01/15/2022)  Tobacco Use: High Risk (09/05/2022)   SDOH Interventions: Food Insecurity Interventions: Inpatient TOC, Other (Comment) (Food pantry resources added to AVS.) Transportation Interventions: Inpatient TOC, Other (Comment) (Patient reported his car has been repaired and he uses transportation services through the cancer center for medical appointments.)  Readmission Risk Interventions     No data to display

## 2022-09-06 NOTE — Consult Note (Addendum)
WOC Nurse ostomy follow-up consult note Pt had ileostomy surgery performed 3/20.  Daughter at bedside for pouch change demonstration and teaching session.  Pt watched the process using a hand held mirror.  Stoma is red and viable, 1 3/8 inches, slightly above skin level.  Large amt liquid brown stool occurred when pouch was removed. Emptied 100cc from previous pouch. Pt was able to stretch the barrier ring to fit and apply to the back of the wafer.  I assisted with pouch application of one piece flexible convex pouch.  He was able to open and close velcro to empty.  Daughter asked appropriate questions.  Skin near outer tape boarder to left lower abd is red and blistered, also the same appearance to right lower abd near JP site. I applied Tegaderm to protect and promote healing.  Discussed pouching routines and ordering supplies.  Reviewed dietary precautions and importance of avoiding dehydration. Educational materials left at bedside, along with 5 sets of each supply.  Use supplies:  barrier rings, [place] # [id number], and Gigi Gin # [phone].  Pt may need a belt; one left at the bedside and I demonstrated use. [place] # [phone])  Enrolled patient in [employer] DC program: Yes, today WOC will perform another pouch change/teaching session on Mon. Thank-you,  Cammie Mcgee MSN, RN, CWOCN, [place], CNS [phone]

## 2022-09-06 NOTE — Progress Notes (Signed)
Physical Therapy Treatment Patient Details Name: Billy Roman MRN: [id number] DOB: [birth year] Today's Date: 09/06/2022   History of Present Illness Patient is 62 y.o. male admitted  3/20 for scheduled Robotic assisted low anterior resection with diverting loop ileostomy.  PMH includes CA, Depression, Anxiety, Sleep Apnea.    PT Comments     Pt admitted with above diagnosis.  Pt currently with functional limitations due to the deficits listed below (see PT Problem List). Pt seated EOB when PT arrived, nursing reports pt having amb in hallway with staff and pain medication administered. Pt agreeable to therapy intervention. Pt indicates 7/10 burning abdominal pain, no change in pain reported with mobility tasks. Pt amb 500 feet with S and RW in hallway. Pt is S for transfers and indicates completing bed mobility mod I. PT attempted to engage pt with step navigation and higher level balance challenges with pt declining. Pt returned to EOB and all needs met. Pt will benefit from acute skilled PT to increase their independence and safety with mobility to allow discharge.     Recommendations for follow up therapy are one component of a multi-disciplinary discharge planning process, led by the attending physician.  Recommendations may be updated based on patient status, additional functional criteria and insurance authorization.  Follow Up Recommendations  Home health PT     Assistance Recommended at Discharge Set up Supervision/Assistance  Patient can return home with the following A little help with walking and/or transfers;A little help with bathing/dressing/bathroom;Assistance with cooking/housework;Direct supervision/assist for medications management;Help with stairs or ramp for entrance;Assist for transportation   Equipment Recommendations  Rolling walker (2 wheels) (TBD as pt progresses)    Recommendations for Other Services       Precautions / Restrictions Precautions Precautions:  Fall Precaution Comments: ileostomy, JP Drain Restrictions Weight Bearing Restrictions: No     Mobility  Bed Mobility               General bed mobility comments: pt was seated EOB when PT arrived    Transfers Overall transfer level: Needs assistance Equipment used: Rolling walker (2 wheels), None Transfers: Sit to/from Stand Sit to Stand: Supervision           General transfer comment: cues or proper UE placement    Ambulation/Gait Ambulation/Gait assistance: Supervision Gait Distance (Feet): 500 Feet Assistive device: Rolling walker (2 wheels) Gait Pattern/deviations: Step-through pattern, Trunk flexed Gait velocity: decreased         Stairs Stairs:  (pt declined)           Wheelchair Mobility    Modified Rankin (Stroke Patients Only)       Balance Overall balance assessment: Needs assistance Sitting-balance support: Feet supported Sitting balance-Leahy Scale: Good     Standing balance support: No upper extremity supported Standing balance-Leahy Scale: Fair                 High Level Balance Comments: pt declined to perform stepping stratagies without UE support            Cognition Arousal/Alertness: Awake/alert Behavior During Therapy: WFL for tasks assessed/performed Overall Cognitive Status: Within Functional Limits for tasks assessed                                          Exercises      General Comments        Pertinent  Vitals/Pain Pain Assessment Pain Assessment: 0-10 Pain Score: 7  Pain Location: abdominal Pain Descriptors / Indicators: Grimacing, Guarding, Burning Pain Intervention(s): Limited activity within patient's tolerance, Monitored during session, Premedicated before session    Home Living                          Prior Function            PT Goals (current goals can now be found in the care plan section) Acute Rehab PT Goals Patient Stated Goal: get home and  keep moving to recover PT Goal Formulation: With patient Time For Goal Achievement: 09/19/22 Potential to Achieve Goals: Good Progress towards PT goals: Progressing toward goals    Frequency    Min 3X/week      PT Plan      Co-evaluation              AM-PAC PT "6 Clicks" Mobility   Outcome Measure  Help needed turning from your back to your side while in a flat bed without using bedrails?: None Help needed moving from lying on your back to sitting on the side of a flat bed without using bedrails?: None Help needed moving to and from a bed to a chair (including a wheelchair)?: None Help needed standing up from a chair using your arms (e.g., wheelchair or bedside chair)?: A Little Help needed to walk in hospital room?: A Little Help needed climbing 3-5 steps with a railing? : A Little 6 Click Score: 21    End of Session Equipment Utilized During Treatment: Gait belt Activity Tolerance: Patient tolerated treatment well Patient left: in bed;with call bell/phone within reach (partial chair position) Nurse Communication: Mobility status PT Visit Diagnosis: Muscle weakness (generalized) (M62.81);Difficulty in walking, not elsewhere classified (R26.2);Pain Pain - part of body:  (abdomen)     Time: [id number] PT Time Calculation (min) (ACUTE ONLY): 19 min  Charges:  $Gait Training: 8-22 mins                     Baird Lyons, PT   Adair Patter 09/06/2022, 10:43 AM

## 2022-09-06 NOTE — Progress Notes (Signed)
2 Days Post-Op  Subjective: Having a lot pain with movement.  Tolerating a diet.  Having ileostomy function  Objective: Vital signs in last 24 hours: Temp:  [97.7 F (36.5 C)-98.5 F (36.9 C)] 98.5 F (36.9 C) (03/22 1127) Pulse Rate:  [72-85] 78 (03/22 1127) Resp:  [18] 18 (03/22 1127) BP: (125-132)/(76-83) 131/76 (03/22 1127) SpO2:  [93 %-97 %] 95 % (03/22 1127) Weight:  [125.7 kg] 125.7 kg (03/22 0500)   Intake/Output from previous day: 03/21 0701 - 03/22 0700 In: 1942 [P.O.:1942] Out: 2945 [Urine:900; Drains:45; Stool:2000] Intake/Output this shift: Total I/O In: 360 [P.O.:360] Out: 1310 [Urine:800; Drains:10; Stool:500]   General appearance: alert and cooperative GI: normal findings: soft, non-tender  Incision: no significant drainage  Lab Results:  Recent Labs    09/05/22 0429 09/06/22 0521  WBC 9.0 5.9  HGB 14.1 13.3  HCT 42.9 40.6  PLT 191 163   BMET Recent Labs    09/05/22 0429 09/06/22 0521  NA 137 134*  K 3.7 4.0  CL 102 103  CO2 26 24  GLUCOSE 121* 100*  BUN 12 13  CREATININE 0.88 0.65  CALCIUM 8.9 8.2*   PT/INR No results for input(s): "LABPROT", "INR" in the last 72 hours. ABG No results for input(s): "PHART", "HCO3" in the last 72 hours.  Invalid input(s): "PCO2", "PO2"  MEDS, Scheduled  acetaminophen  1,000 mg Oral Q6H   feeding supplement  237 mL Oral BID BM   gabapentin  300 mg Oral TID   heparin injection (subcutaneous)  5,000 Units Subcutaneous Q8H   lip balm   Topical BID   loperamide  2 mg Oral TID PC & HS   polycarbophil  625 mg Oral BID    Studies/Results: No results found.  Assessment: s/p Procedure(s): ROBOTIC LOW ANTERIOR RESECTION, BILATERAL TAP BLOCK, AND INOPERATIVE ASSESSMENT OF PERFUSION USING FIREFLY DYE LOOP ILEOSTOMY FLEXIBLE SIGMOIDOSCOPY Patient Active Problem List   Diagnosis Date Noted   Right inguinal hernia 09/05/2022   Insomnia 09/05/2022   Chronic pain 09/05/2022   S/P robot-assisted  surgical procedure 09/04/2022   Ileostomy in place [facility]) 09/04/2022   Rectal cancer ([place]) 11/20/2021   Cognitive impairment 09/18/2021   Confusion 09/18/2021   Tremor 09/18/2021   Complaints of memory disturbance 08/24/2021   Tremor of both hands 08/24/2021   H/O rotator cuff surgery 08/24/2021   History of COVID-19 08/24/2021   Smoker 08/24/2021   [name], recurrent severe, without psychosis ([place]) 08/23/2021   Stiffness of right shoulder joint 02/09/2021   Pain in joint of right shoulder 02/09/2021   Vitamin D deficiency 01/17/2021   HNP (herniated nucleus pulposus), lumbar 01/15/2021   Sciatica 01/15/2021   Anxiety and depression 08/23/2009    Expected post op course  Plan:  Scheduled Imodium for ostomy output Cont PT Cont ostomy teaching, outpatient ostomy clinic referral in place.  Home health difficult to arrange due to patient's insurance Change to oxycodone for pain Ok to d/c when ostomy output < 1062ml/day and pain controlled with PO meds   LOS: 2 days     .Rosario Adie, MD [facility], [place]    09/06/2022 3:41 PM

## 2022-09-07 LAB — CBC
HCT: 40.8 % (ref 39.0–52.0)
Hemoglobin: 13.4 g/dL (ref 13.0–17.0)
MCH: 31.3 pg (ref 26.0–34.0)
MCHC: 32.8 g/dL (ref 30.0–36.0)
MCV: 95.3 fL (ref 80.0–100.0)
Platelets: 191 10*3/uL (ref 150–400)
RBC: 4.28 MIL/uL (ref 4.22–5.81)
RDW: 12.1 % (ref 11.5–15.5)
WBC: 5.3 10*3/uL (ref 4.0–10.5)
nRBC: 0 % (ref 0.0–0.2)

## 2022-09-07 LAB — BASIC METABOLIC PANEL
Anion gap: 7 (ref 5–15)
BUN: 15 mg/dL (ref 6–20)
CO2: 25 mmol/L (ref 22–32)
Calcium: 8.7 mg/dL — ABNORMAL LOW (ref 8.9–10.3)
Chloride: 105 mmol/L (ref 98–111)
Creatinine, Ser: 0.72 mg/dL (ref 0.61–1.24)
GFR, Estimated: >60 mL/min (ref >60)
Glucose, Bld: 116 mg/dL — ABNORMAL HIGH (ref 70–99)
Potassium: 3.7 mmol/L (ref 3.5–5.1)
Sodium: 137 mmol/L (ref 135–145)

## 2022-09-07 NOTE — Progress Notes (Signed)
Mobility Specialist - Progress Note   09/07/22 1504  Mobility  Activity Ambulated with assistance in hallway  Level of Assistance Modified independent, requires aide device or extra time  Assistive Device Front wheel walker  Distance Ambulated (ft) 700 ft  Activity Response Tolerated well  Mobility Referral Yes  $Mobility charge 1 Mobility   Pt received in bed and agreeable to mobility. Upon returning to room pt c/o abdomen hurting, rating it a 6/10/ No other complaints during session. Pt to bed after session with all needs met.    [facility]

## 2022-09-07 NOTE — Progress Notes (Signed)
3 Days Post-Op   Subjective/Chief Complaint: Bad night sore at ostomy site, having ileostomy output, no n/v did ambulate 3 times and worked with PT but dizzy and feels unsteady on feet   Objective: Vital signs in last 24 hours: Temp:  [98.5 F (36.9 C)-98.9 F (37.2 C)] 98.9 F (37.2 C) (03/23 0453) Pulse Rate:  [78-80] 80 (03/23 0453) Resp:  [18] 18 (03/23 0453) BP: (127-132)/(72-76) 132/74 (03/23 0453) SpO2:  [94 %-100 %] 100 % (03/23 0453) Last BM Date : 09/06/22  Intake/Output from previous day: 03/22 0701 - 03/23 0700 In: 540 [P.O.:540] Out: 2025 [Urine:1050; Drains:25; Stool:950] Intake/Output this shift: No intake/output data recorded.  General nad Cv regular Pulm effort normal Ab nondistended minimally tender, ostomy with liquid output, drain serous  Lab Results:  Recent Labs    09/06/22 0521 09/07/22 0448  WBC 5.9 5.3  HGB 13.3 13.4  HCT 40.6 40.8  PLT 163 191   BMET Recent Labs    09/06/22 0521 09/07/22 0448  NA 134* 137  K 4.0 3.7  CL 103 105  CO2 24 25  GLUCOSE 100* 116*  BUN 13 15  CREATININE 0.65 0.72  CALCIUM 8.2* 8.7*   PT/INR No results for input(s): "LABPROT", "INR" in the last 72 hours. ABG No results for input(s): "PHART", "HCO3" in the last 72 hours.  Invalid input(s): "PCO2", "PO2"  Studies/Results: No results found.  Anti-infectives: Anti-infectives (From admission, onward)    Start     Dose/Rate Route Frequency Ordered Stop   09/04/22 1400  neomycin (MYCIFRADIN) tablet 1,000 mg  Status:  Discontinued       See Hyperspace for full Linked Orders Report.   1,000 mg Oral 3 times per day 09/04/22 [id number] 09/04/22 0638   09/04/22 1400  metroNIDAZOLE (FLAGYL) tablet 1,000 mg  Status:  Discontinued       See Hyperspace for full Linked Orders Report.   1,000 mg Oral 3 times per day 09/04/22 [id number] 09/04/22 [id number]   09/04/22 0645  cefoTEtan (CEFOTAN) 2 g in sodium chloride 0.9 % 100 mL IVPB        2 g 200 mL/hr over 30 Minutes  Intravenous On call to O.R. 09/04/22 [id number] 09/05/22 0701       Assessment/Plan: POD 3 robotic LAR -continue current pain control and imodium -not ready for dc due to pain and he lives at home- unsteady on feet- needs to work more with PT Sq heparin, scds  Rolm Bookbinder 09/07/2022

## 2022-09-08 MED ORDER — HYDROMORPHONE HCL 1 MG/ML IJ SOLN: 0.5000 mg | INTRAMUSCULAR | Status: DC | PRN

## 2022-09-08 MED ORDER — SODIUM CHLORIDE 0.9 % IV BOLUS
500.0000 mL | Freq: Once | INTRAVENOUS | Status: AC
  Administered 2022-09-08: 500 mL via INTRAVENOUS

## 2022-09-08 NOTE — Progress Notes (Signed)
4 Days Post-Op   Subjective/Chief Complaint: Still sore, having ileostomy output more in 24 hours, walked yesterday, voiding   Objective: Vital signs in last 24 hours: Temp:  [98 F (36.7 C)-98.4 F (36.9 C)] 98.4 F (36.9 C) (03/24 0547) Pulse Rate:  [73-81] 73 (03/24 0547) Resp:  [16] 16 (03/24 0547) BP: (127-128)/(73-80) 127/77 (03/24 0547) SpO2:  [95 %-97 %] 95 % (03/24 0547) Last BM Date : 09/07/22  Intake/Output from previous day: 03/23 0701 - 03/24 0700 In: 360 [P.O.:360] Out: 3005 [Urine:1420; Drains:10; [id number] Intake/Output this shift: No intake/output data recorded.  General nad Cv regular Pulm effort normal Ab nondistended minimally tender, ostomy with liquid output, drain serous  Lab Results:  Recent Labs    09/06/22 0521 09/07/22 0448  WBC 5.9 5.3  HGB 13.3 13.4  HCT 40.6 40.8  PLT 163 191   BMET Recent Labs    09/06/22 0521 09/07/22 0448  NA 134* 137  K 4.0 3.7  CL 103 105  CO2 24 25  GLUCOSE 100* 116*  BUN 13 15  CREATININE 0.65 0.72  CALCIUM 8.2* 8.7*   PT/INR No results for input(s): "LABPROT", "INR" in the last 72 hours. ABG No results for input(s): "PHART", "HCO3" in the last 72 hours.  Invalid input(s): "PCO2", "PO2"  Studies/Results: No results found.  Anti-infectives: Anti-infectives (From admission, onward)    Start     Dose/Rate Route Frequency Ordered Stop   09/04/22 1400  neomycin (MYCIFRADIN) tablet 1,000 mg  Status:  Discontinued       See Hyperspace for full Linked Orders Report.   1,000 mg Oral 3 times per day 09/04/22 [id number] 09/04/22 0638   09/04/22 1400  metroNIDAZOLE (FLAGYL) tablet 1,000 mg  Status:  Discontinued       See Hyperspace for full Linked Orders Report.   1,000 mg Oral 3 times per day 09/04/22 [id number] 09/04/22 [id number]   09/04/22 0645  cefoTEtan (CEFOTAN) 2 g in sodium chloride 0.9 % 100 mL IVPB        2 g 200 mL/hr over 30 Minutes Intravenous On call to O.R. 09/04/22 [id number] 09/05/22 0701        Assessment/Plan: POD 4 robotic LAR -continue current pain control and imodium -not ready for dc due to pain and he lives at home- unsteady on feet- needs to work more with PT especially with stairs -SQ heparin, scds -will give him some extra fluid today although Cr fine (urine is dark) as he had more ileostomy output. Prior day was less than one liter so wont change regimen for now and follow   Rolm Bookbinder 09/08/2022

## 2022-09-08 NOTE — Progress Notes (Signed)
Mobility Specialist - Progress Note   09/08/22 1025  Mobility  Activity Ambulated with assistance in hallway  Level of Assistance Modified independent, requires aide device or extra time  Assistive Device Front wheel walker  Distance Ambulated (ft) 850 ft  Activity Response Tolerated well  Mobility Referral Yes  $Mobility charge 1 Mobility   Pt received in bed and agreeable to mobility. No complaints during session. Pt to bed after session with all needs met.    [facility]

## 2022-09-08 NOTE — Progress Notes (Signed)
Mobility Specialist - Progress Note   09/08/22 1458  Mobility  Activity Ambulated with assistance in hallway  Level of Assistance Modified independent, requires aide device or extra time  Assistive Device Front wheel walker  Distance Ambulated (ft) 750 ft  Activity Response Tolerated well  Mobility Referral Yes  $Mobility charge 1 Mobility   Pt received in bed and agreeable to mobility. No complaints during mobility. Pt to EOB after session with all needs met.    [facility]

## 2022-09-09 LAB — BASIC METABOLIC PANEL
Anion gap: 10 (ref 5–15)
BUN: 12 mg/dL (ref 6–20)
CO2: 24 mmol/L (ref 22–32)
Calcium: 8.9 mg/dL (ref 8.9–10.3)
Chloride: 99 mmol/L (ref 98–111)
Creatinine, Ser: 0.59 mg/dL — ABNORMAL LOW (ref 0.61–1.24)
GFR, Estimated: >60 mL/min (ref >60)
Glucose, Bld: 120 mg/dL — ABNORMAL HIGH (ref 70–99)
Potassium: 4.3 mmol/L (ref 3.5–5.1)
Sodium: 133 mmol/L — ABNORMAL LOW (ref 135–145)

## 2022-09-09 LAB — CBC
HCT: 42.9 % (ref 39.0–52.0)
Hemoglobin: 14.5 g/dL (ref 13.0–17.0)
MCH: 31.1 pg (ref 26.0–34.0)
MCHC: 33.8 g/dL (ref 30.0–36.0)
MCV: 92.1 fL (ref 80.0–100.0)
Platelets: 270 10*3/uL (ref 150–400)
RBC: 4.66 MIL/uL (ref 4.22–5.81)
RDW: 12.1 % (ref 11.5–15.5)
WBC: 6.5 10*3/uL (ref 4.0–10.5)
nRBC: 0 % (ref 0.0–0.2)

## 2022-09-09 LAB — SURGICAL PATHOLOGY

## 2022-09-09 MED ORDER — LOPERAMIDE HCL 2 MG PO CAPS
4.0000 mg | ORAL_CAPSULE | Freq: Three times a day (TID) | ORAL | Status: DC
  Administered 2022-09-09 – 2022-09-10 (×8): 4 mg via ORAL
  Filled 2022-09-09 (×9): qty 2

## 2022-09-09 NOTE — Progress Notes (Signed)
Physical Therapy Treatment and Discharge Patient Details Name: Billy Roman MRN: [id number] DOB: 1961/04/17 Today's Date: 09/09/2022   History of Present Illness Patient is 62 y.o. male admitted  3/20 for scheduled Robotic assisted low anterior resection with diverting loop ileostomy.  PMH includes CA, Depression, Anxiety, Sleep Apnea.    PT Comments     Pt is progressing well, meeting goals in acute setting. Pt should continue with mobility team or amb with nursing staff/family; no further needs in acute setting. Refer to one view for f/u PT  recommendations   Recommendations for follow up therapy are one component of a multi-disciplinary discharge planning process, led by the attending physician.  Recommendations may be updated based on patient status, additional functional criteria and insurance authorization.  Follow Up Recommendations       Assistance Recommended at Discharge Set up Supervision/Assistance  Patient can return home with the following A little help with bathing/dressing/bathroom;Assistance with cooking/housework;Assist for transportation;Help with stairs or ramp for entrance   Equipment Recommendations  Rolling walker (2 wheels)    Recommendations for Other Services       Precautions / Restrictions Precautions Precaution Comments: ileostomy, JP Drain Restrictions Weight Bearing Restrictions: No     Mobility  Bed Mobility Overal bed mobility: Needs Assistance Bed Mobility: Rolling, Sidelying to Sit Rolling: Supervision Sidelying to sit: Supervision, HOB elevated (has adjustable bed at home)       General bed mobility comments: cues to log roll    Transfers Overall transfer level: Modified independent Equipment used: Rolling walker (2 wheels), None Transfers: Sit to/from Stand Sit to Stand: Modified independent (Device/Increase time)           General transfer comment: repeated x4 from varied ht surfaces     Ambulation/Gait Ambulation/Gait assistance: Supervision, Modified independent (Device/Increase time) Gait Distance (Feet): 250 Feet Assistive device: Rolling walker (2 wheels) Gait Pattern/deviations: Step-through pattern       General Gait Details: pt is able to amb short distances without RW support, no LOB, RW increases stability, decr abd pain with support. no overt LOB   Stairs Stairs: Yes Stairs assistance: Supervision Stair Management: One rail Right, Two rails, Step to pattern, Alternating pattern, Forwards Number of Stairs: 7 (x3) General stair comments: cues for safe technique, use of one rail; educated on ascending with stronger or least painful side if needed (pt reports "bad ankle on L side")   Wheelchair Mobility    Modified Rankin (Stroke Patients Only)       Balance   Sitting-balance support: Feet supported Sitting balance-Leahy Scale: Good     Standing balance support: No upper extremity supported Standing balance-Leahy Scale: Fair               High level balance activites: Direction changes, Head turns, Side stepping High Level Balance Comments: no LOB, decreasign reliance on UEs            Cognition Arousal/Alertness: Awake/alert Behavior During Therapy: WFL for tasks assessed/performed Overall Cognitive Status: Within Functional Limits for tasks assessed                                          Exercises      General Comments        Pertinent Vitals/Pain Pain Assessment Pain Assessment: 0-10 Pain Score: 6  Pain Location: abdominal, mostly around drain site Pain Descriptors / Indicators: Hervey Ard,  Shooting Pain Intervention(s): Limited activity within patient's tolerance, Monitored during session, Repositioned    Home Living                          Prior Function            PT Goals (current goals can now be found in the care plan section) Acute Rehab PT Goals Patient Stated Goal: get home  and keep moving to recover PT Goal Formulation: With patient Time For Goal Achievement: 09/19/22 Potential to Achieve Goals: Good Progress towards PT goals: Progressing toward goals    Frequency           PT Plan Other (comment) (d/c PT)    Co-evaluation              AM-PAC PT "6 Clicks" Mobility   Outcome Measure  Help needed turning from your back to your side while in a flat bed without using bedrails?: None Help needed moving from lying on your back to sitting on the side of a flat bed without using bedrails?: None Help needed moving to and from a bed to a chair (including a wheelchair)?: None Help needed standing up from a chair using your arms (e.g., wheelchair or bedside chair)?: None Help needed to walk in hospital room?: None Help needed climbing 3-5 steps with a railing? : A Little 6 Click Score: 23    End of Session   Activity Tolerance: Patient tolerated treatment well Patient left: in bed;with call bell/phone within reach;Other (comment) (Glenn Dale RN)   PT Visit Diagnosis: Unsteadiness on feet (R26.81)     Time: [id number] PT Time Calculation (min) (ACUTE ONLY): 26 min  Charges:  $Gait Training: 23-37 mins                     Baxter Flattery, PT  Acute Rehab Dept [facility]) [phone]  [facility] Weekend Pager [facility] only)  [phone]  09/09/2022    [facility] 09/09/2022, 12:24 PM

## 2022-09-09 NOTE — Progress Notes (Signed)
5 Days Post-Op   Subjective/Chief Complaint: Still sore, having more ileostomy in last 48 hours, walked yesterday, voiding   Objective: Vital signs in last 24 hours: Temp:  [97.8 F (36.6 C)-98.1 F (36.7 C)] 98.1 F (36.7 C) (03/25 0540) Pulse Rate:  [75-82] 75 (03/25 0540) Resp:  [16] 16 (03/25 0540) BP: (116-127)/(72-81) 127/79 (03/25 0540) SpO2:  [94 %-97 %] 94 % (03/25 0540) Last BM Date : 09/08/22 (colostomy)  Intake/Output from previous day: 03/24 0701 - 03/25 0700 In: 1070 [P.O.:1070] Out: 3480 [Urine:1910; Drains:20; [id number] Intake/Output this shift: No intake/output data recorded.  General nad Cv regular Pulm effort normal Ab nondistended minimally tender, ostomy with liquid output, drain serous  Lab Results:  Recent Labs    09/07/22 0448  WBC 5.3  HGB 13.4  HCT 40.8  PLT 191    BMET Recent Labs    09/07/22 0448  NA 137  K 3.7  CL 105  CO2 25  GLUCOSE 116*  BUN 15  CREATININE 0.72  CALCIUM 8.7*    PT/INR No results for input(s): "LABPROT", "INR" in the last 72 hours. ABG No results for input(s): "PHART", "HCO3" in the last 72 hours.  Invalid input(s): "PCO2", "PO2"  Studies/Results: No results found.  Anti-infectives: Anti-infectives (From admission, onward)    Start     Dose/Rate Route Frequency Ordered Stop   09/04/22 1400  neomycin (MYCIFRADIN) tablet 1,000 mg  Status:  Discontinued       See Hyperspace for full Linked Orders Report.   1,000 mg Oral 3 times per day 09/04/22 [id number] 09/04/22 0638   09/04/22 1400  metroNIDAZOLE (FLAGYL) tablet 1,000 mg  Status:  Discontinued       See Hyperspace for full Linked Orders Report.   1,000 mg Oral 3 times per day 09/04/22 [id number] 09/04/22 0638   09/04/22 0645  cefoTEtan (CEFOTAN) 2 g in sodium chloride 0.9 % 100 mL IVPB        2 g 200 mL/hr over 30 Minutes Intravenous On call to O.R. 09/04/22 [id number] 09/05/22 0701       Assessment/Plan: POD 5 robotic LAR, DLI -continue current pain  control and imodium -not ready for dc due to high ileostomy output.  Increased Imodium to 4mg  QID -SQ heparin, scds - recheck Cr today. Prior day was less than one liter so wont change regimen for now and follow   Rosario Adie [phone]

## 2022-09-09 NOTE — TOC Progression Note (Signed)
Transition of Care [facility]) - Progression Note   Patient Details  Name: Billy Roman MRN: [id number] Date of Birth: 1961/03/30  Transition of Care [facility]) CM/SW [place], LCSW Phone Number: 09/09/2022, 1:12 PM  Clinical Narrative: PT evaluation recommended a rolling walker for home use. Patient and daughter are agreeable to DME referral. CSW made referral to [facility] with [employer]. [employer] to deliver rolling walker to patient's room once the DME order is signed by the surgeon.  Expected Discharge Plan: [place] Barriers to Discharge: Continued Medical Work up  Expected Discharge Plan and Services In-house Referral: Clinical Social Work Post Acute Care Choice: [place] arrangements for the past 2 months: [place]           DME Arranged: Walker rolling DME Agency: [employer] Date DME Agency Contacted: 09/09/22 Time DME Agency Contacted: 40 Representative spoke with at DME Agency: Erasmo Downer HH Arranged: PT [place]: [place] Date [place]: 09/06/22 Representative spoke with at [place]: Amy  Social Determinants of Health ([place]) Interventions SDOH Screenings   Food Insecurity: Food Insecurity Present (09/04/2022)  Housing: Low Risk  (09/04/2022)  Transportation Needs: Unmet Transportation Needs (09/04/2022)  Utilities: Not At Risk (09/04/2022)  Alcohol Screen: Low Risk  (08/23/2021)  Depression (PHQ2-9): High Risk (08/23/2021)  Financial Resource Strain: Medium Risk (01/15/2022)  Tobacco Use: High Risk (09/05/2022)   Readmission Risk Interventions     No data to display

## 2022-09-09 NOTE — Progress Notes (Signed)
OT Cancellation Note  Patient Details Name: Billy Roman MRN: [id number] DOB: 1960-07-25   Cancelled Treatment:    Reason Eval/Treat Not Completed: Patient declined, no reason specified Patient declined to participate in session reporting he was coughing too much earlier and needed to rest. OT to continue to follow and check back as schedule will allow  Rennie Plowman, MS Acute Rehabilitation Department Office# [phone]  09/09/2022, 9:31 AM

## 2022-09-09 NOTE — Consult Note (Addendum)
[place] Nurse ostomy follow-up consult note Pt had ileostomy surgery performed 3/20.  Another daughter at bedside for pouch change demonstration and teaching session. Pt watched the process using a hand held mirror and assisted with the steps.  Stoma is red and viable, 1 3/8 inches, slightly above skin level.  Emptied 50cc liquid brown stool from previous pouch. Pt was able to stretch the barrier ring to fit and apply to the back of the wafer.  I assisted with pouch application of one piece flexible convex pouch.  He was able to open and close velcro to empty.  Daughter asked appropriate questions.  Skin near tape boarder to inner left lower abd has a red slightly bloody partial thickness wound where previous blisters have ruptured; approx 2X1X.1cm. Foam dressing applied to protect from further injury. Pt c/o pain at the JP site; skin with generalized edema and erythema surrounding the insertion site.  Discussed pouching routines and ordering supplies.  Reviewed dietary precautions and importance of avoiding dehydration. Educational materials left at bedside, along with 5 sets of each supply.  Use supplies:  barrier rings, Lawson # [id number], and Roselee Culver # [phone].  Pt may need a belt; one left at the bedside and I demonstrated use. Kellie Simmering # [phone])  Enrolled patient in [place] program: Yes, previously. Thank-you,  Julien Girt MSN, [place], [place], [place], [place]

## 2022-09-10 MED ORDER — CALCIUM POLYCARBOPHIL 625 MG PO TABS: 625.0000 mg | ORAL_TABLET | Freq: Two times a day (BID) | ORAL | Status: DC

## 2022-09-10 MED ORDER — OXYCODONE HCL 5 MG PO TABS: 5.0000 mg | ORAL_TABLET | Freq: Four times a day (QID) | ORAL | 0 refills | Status: DC | PRN

## 2022-09-10 MED ORDER — LOPERAMIDE HCL 2 MG PO CAPS: 4.0000 mg | ORAL_CAPSULE | Freq: Three times a day (TID) | ORAL | 0 refills | Status: DC

## 2022-09-10 NOTE — Progress Notes (Signed)
Order placed to DC JP drain. Drain site is red and irritated. After drain removal there was some purulent discharge from drain location. Paged on call MD, no new orders given. Will put some antibiotic ointment on the location and cover with gauze.

## 2022-09-10 NOTE — TOC Progression Note (Signed)
Transition of Care [facility]) - Progression Note   Patient Details  Name: KWEKU WOLFENBARGER MRN: [id number] Date of Birth: [birth year]  Transition of Care [facility]) CM/SW [place], LCSW Phone Number: 09/10/2022, 1:20 PM  Clinical Narrative: [employer] confirmed rolling walker was delivered to patient's room. TOC awaiting HHPT orders for [facility].  Expected Discharge Plan: [place] Barriers to Discharge: Continued Medical Work up  Expected Discharge Plan and Services In-house Referral: Clinical Social Work Post Acute Care Choice: [place] arrangements for the past 2 months: [place]           DME Arranged: Walker rolling DME Agency: [employer] Date DME Agency Contacted: 09/09/22 Time DME Agency Contacted: 53 Representative spoke with at DME Agency: Erasmo Downer HH Arranged: PT [place]: [place] Date [place]: 09/06/22 Representative spoke with at [place]: Amy  Social Determinants of Health ([place]) Interventions SDOH Screenings   Food Insecurity: Food Insecurity Present (09/04/2022)  Housing: Low Risk  (09/04/2022)  Transportation Needs: Unmet Transportation Needs (09/04/2022)  Utilities: Not At Risk (09/04/2022)  Alcohol Screen: Low Risk  (08/23/2021)  Depression (PHQ2-9): High Risk (08/23/2021)  Financial Resource Strain: Medium Risk (01/15/2022)  Tobacco Use: High Risk (09/05/2022)   Readmission Risk Interventions     No data to display

## 2022-09-10 NOTE — Progress Notes (Signed)
Occupational Therapy Treatment Patient Details Name: Billy Roman MRN: [id number] DOB: [birth year] Today's Date: 09/10/2022   History of present illness Patient is 62 y.o. male admitted  3/20 for scheduled Robotic assisted low anterior resection with diverting loop ileostomy.  PMH includes CA, Depression, Anxiety, Sleep Apnea.   OT comments  Patient was noted to have improved on goals. Patient was able to stand and use urinal in bathroom with no LOB with no UE support. Patient continues to report increased pain on R side near JP drain site. Patient appears to be nearing s/o from OT with next session to review d/c recommendations with patient again per patient request. OT to continue to follow.     Recommendations for follow up therapy are one component of a multi-disciplinary discharge planning process, led by the attending physician.  Recommendations may be updated based on patient status, additional functional criteria and insurance authorization.    Assistance Recommended at Discharge PRN  Patient can return home with the following  Assist for transportation;Assistance with cooking/housework   Equipment Recommendations  None recommended by OT       Precautions / Restrictions Precautions Precautions: Fall Precaution Comments: ileostomy, JP Drain Restrictions Weight Bearing Restrictions: No       Mobility Bed Mobility Overal bed mobility: Needs Assistance     Sidelying to sit: Supervision (has adjustable bed at home)   Sit to supine: Supervision, HOB elevated   General bed mobility comments: cues to log roll       Balance Overall balance assessment: Needs assistance Sitting-balance support: Feet supported Sitting balance-Leahy Scale: Good     Standing balance support: No upper extremity supported Standing balance-Leahy Scale: Fair           ADL either performed or assessed with clinical judgement   ADL Overall ADL's : Needs assistance/impaired                        Lower Body Dressing Details (indicate cue type and reason): patient was able to don socks sitting EOB with MI. patient reported increased pain with figure four positioning EOB but able to complete. R >L Toilet Transfer: Regular Toilet;Modified Independent Toilet Transfer Details (indicate cue type and reason): standing at commode with no RW patient declined to use it. Toileting- Clothing Manipulation and Hygiene: Modified independent;Sit to/from stand                Cognition Arousal/Alertness: Awake/alert Behavior During Therapy: WFL for tasks assessed/performed Overall Cognitive Status: Within Functional Limits for tasks assessed         General Comments: patient seemed to have a particular way to participate in tasks. overall cooperative                   Pertinent Vitals/ Pain       Pain Assessment Pain Assessment: 0-10 Faces Pain Scale: Hurts even more Pain Location: abdominal, mostly around drain site Pain Descriptors / Indicators: Sharp, Shooting Pain Intervention(s): Limited activity within patient's tolerance, Monitored during session, Patient requesting pain meds-RN notified, RN gave pain meds during session, Repositioned         Frequency  Min 2X/week        Progress Toward Goals  OT Goals(current goals can now be found in the care plan section)  Progress towards OT goals: Progressing toward goals     Plan Discharge plan remains appropriate       AM-PAC OT "6 Clicks" Daily Activity  Outcome Measure   Help from another person eating meals?: None Help from another person taking care of personal grooming?: None Help from another person toileting, which includes using toliet, bedpan, or urinal?: None Help from another person bathing (including washing, rinsing, drying)?: A Little Help from another person to put on and taking off regular upper body clothing?: None Help from another person to put on and taking off  regular lower body clothing?: A Little 6 Click Score: 22    End of Session    OT Visit Diagnosis: Unsteadiness on feet (R26.81)   Activity Tolerance Patient tolerated treatment well;Patient limited by pain   Patient Left in bed;with call bell/phone within reach;with bed alarm set   Nurse Communication Patient requests pain meds        Time: 1207-1229 OT Time Calculation (min): 22 min  Charges: OT General Charges $OT Visit: 1 Visit OT Treatments $Self Care/Home Management : 8-22 mins  Rennie Plowman, MS Acute Rehabilitation Department Office# [phone]   Willa Rough 09/10/2022, 1:11 PM

## 2022-09-10 NOTE — Progress Notes (Signed)
6 Days Post-Op   Subjective/Chief Complaint: Ileostomy output better, cr stable with fiber and imodium.  Pt c/o of right sided pain mostly.  Ambulating with PT.  Home health PT arranged.     Objective: Vital signs in last 24 hours: Temp:  [98.1 F (36.7 C)-99.5 F (37.5 C)] 99 F (37.2 C) (03/26 0646) Pulse Rate:  [84-100] 90 (03/26 0646) Resp:  [16-18] 18 (03/26 0646) BP: (127-137)/(70-92) 127/75 (03/26 0646) SpO2:  [90 %-95 %] 95 % (03/26 0646) Last BM Date : 09/10/22  Intake/Output from previous day: 03/25 0701 - 03/26 0700 In: 1200 [P.O.:1200] Out: 2545 [Urine:900; Drains:495; Stool:1150] Intake/Output this shift: Total I/O In: 240 [P.O.:240] Out: 155 [Drains:5; Stool:150]  General nad Cv regular Pulm effort normal Ab nondistended minimally tender, ostomy with liquid output, drain serous  Lab Results:  Recent Labs    09/09/22 0859  WBC 6.5  HGB 14.5  HCT 42.9  PLT 270    BMET Recent Labs    09/09/22 0859  NA 133*  K 4.3  CL 99  CO2 24  GLUCOSE 120*  BUN 12  CREATININE 0.59*  CALCIUM 8.9    PT/INR No results for input(s): "LABPROT", "INR" in the last 72 hours. ABG No results for input(s): "PHART", "HCO3" in the last 72 hours.  Invalid input(s): "PCO2", "PO2"  Studies/Results: No results found.  Anti-infectives: Anti-infectives (From admission, onward)    Start     Dose/Rate Route Frequency Ordered Stop   09/04/22 1400  neomycin (MYCIFRADIN) tablet 1,000 mg  Status:  Discontinued       See Hyperspace for full Linked Orders Report.   1,000 mg Oral 3 times per day 09/04/22 [id number] 09/04/22 0638   09/04/22 1400  metroNIDAZOLE (FLAGYL) tablet 1,000 mg  Status:  Discontinued       See Hyperspace for full Linked Orders Report.   1,000 mg Oral 3 times per day 09/04/22 [id number] 09/04/22 0638   09/04/22 0645  cefoTEtan (CEFOTAN) 2 g in sodium chloride 0.9 % 100 mL IVPB        2 g 200 mL/hr over 30 Minutes Intravenous On call to O.R. 09/04/22 [id number]  09/05/22 0701       Assessment/Plan: POD 6 robotic LAR, DLI -continue current pain control and imodium, d/c dilaudid -not ready for dc per patient.  "Doesn't feel ready".   Cont Imodium 4mg  QID, fiber BID -SQ heparin, scds - Pt shows poor insight and judgement into his recovery process.  We have set up all the assistance that we can based on what his insurance will cover.  I see no further medical reason that he needs to be hospitalized.  Will plan on d/c in AM when he has a ride home.      Billy Roman [id number]

## 2022-09-10 NOTE — Plan of Care (Signed)

## 2022-09-11 NOTE — Progress Notes (Signed)
Patient upset this shift voicing his concerns; "The doctor did not look at my incisions, and stood at the door," "I would like a second opinion". "I do not want to go home and have to come back if this is infected". Patient states he is having increase pain at the incision site of the removed JP drain that shoots across his abd. Assessment of the site area was warm to touch and, red with a slight yellow granulation inside the incision. Schedule and PRN medication to be given.

## 2022-09-11 NOTE — Discharge Summary (Signed)
Physician Discharge Summary  Patient ID: Billy Roman MRN: [id number] DOB/AGE: February 22, 1961 62 y.o.  Admit date: 09/04/2022 Discharge date: 09/11/2022  Admission Diagnoses: Rectal cancer  Discharge Diagnoses:  Principal Problem:   Rectal cancer ([place]) Active Problems:   Anxiety and depression   [name], recurrent severe, without psychosis ([place])   Complaints of memory disturbance   S/P robot-assisted surgical procedure   Ileostomy in place [facility])   Right inguinal hernia   Insomnia   Chronic pain   Discharged Condition: stable  Hospital Course: Patient was admitted to the med surg floor after surgery.  Diet was advanced as tolerated.  Patient began to have bowel function on postop day 2.  He was kept in the hospital due to pain and mobility issues as well as high output ileostomy.  By postop day 7, he was tolerating a solid diet and pain was controlled with oral medications.  He was urinating without difficulty and ambulating without assistance. He was managing his ostomy with some assistance.  Patient was felt to be in stable condition for discharge to home.   Consults: None  Significant Diagnostic Studies: labs: cbc, bmet  Treatments: IV hydration and surgery: Robotic LAR with diverting loop ileostomy  Discharge Exam: Blood pressure 133/83, pulse 79, temperature 98 F (36.7 C), temperature source Oral, resp. rate 18, height 6\' 1"  (1.854 m), weight 125.7 kg, SpO2 95 %. General appearance: alert and cooperative GI: normal findings: soft, non-tender Incision/Wound: clean, dry, intact  Disposition: Discharge disposition: 01-Home or Self Care       Discharge Instructions     Amb Referral to Ostomy Clinic   Complete by: As directed    Reason for referral modifiers: Pre and post-operative counseling for ostomy management   Call MD for:   Complete by: As directed    FEVER > 101.5 F  (temperatures < 101.5 F are not significant)   Call MD for:   extreme fatigue   Complete by: As directed    Call MD for:  persistant dizziness or light-headedness   Complete by: As directed    Call MD for:  persistant nausea and vomiting   Complete by: As directed    Call MD for:  redness, tenderness, or signs of infection (pain, swelling, redness, odor or green/yellow discharge around incision site)   Complete by: As directed    Call MD for:  severe uncontrolled pain   Complete by: As directed    Diet - low sodium heart healthy   Complete by: As directed    Start with a bland diet such as soups, liquids, starchy foods, low fat foods, etc. the first few days at home. Gradually advance to a solid, low-fat, high fiber diet by the end of the first week at home.   Add a fiber supplement to your diet (Metamucil, etc) If you feel full, bloated, or constipated, stay on a full liquid or pureed/blenderized diet for a few days until you feel better and are no longer constipated.   Discharge instructions   Complete by: As directed    See Discharge Instructions If you are not getting better after two weeks or are noticing you are getting worse, contact our office [phone] for further advice.  We may need to adjust your medications, re-evaluate you in the office, send you to the emergency room, or see what other things we can do to help. The clinic staff is available to answer your questions during regular business hours (8:30am-5pm).  Please don't hesitate to call and ask to speak to one of our nurses for clinical concerns.    A surgeon from [facility] is always on call at the hospitals 24 hours/day If you have a medical emergency, go to the nearest emergency room or call 911.   Discharge wound care:   Complete by: As directed    It is good for closed incisions and even open wounds to be washed every day.  Shower every day.  Short baths are fine.  Wash the incisions and wounds clean with soap & water.    You may leave closed incisions open to  air if it is dry.   You may cover the incision with clean gauze & replace it after your daily shower for comfort.  DERMABOND:  You have purple skin glue (Dermabond) on your incision(s).  Leave them in place, and they will fall off on their own like a scab in 2-3 weeks.  You may trim any edges that curl up with clean scissors.   Driving Restrictions   Complete by: As directed    You may drive when: - you are no longer taking narcotic prescription pain medication - you can comfortably wear a seatbelt - you can safely make sudden turns/stops without pain.   Increase activity slowly   Complete by: As directed    Start light daily activities --- self-care, walking, climbing stairs- beginning the day after surgery.  Gradually increase activities as tolerated.  Control your pain to be active.  Stop when you are tired.  Ideally, walk several times a day, eventually an hour a day.   Most people are back to most day-to-day activities in a few weeks.  It takes 4-6 weeks to get back to unrestricted, intense activity. If you can walk 30 minutes without difficulty, it is safe to try more intense activity such as jogging, treadmill, bicycling, low-impact aerobics, swimming, etc. Save the most intensive and strenuous activity for last (Usually 4-8 weeks after surgery) such as sit-ups, heavy lifting, contact sports, etc.  Refrain from any intense heavy lifting or straining until you are off narcotics for pain control.  You will have off days, but things should improve week-by-week. DO NOT PUSH THROUGH PAIN.  Let pain be your guide: If it hurts to do something, don't do it.   Lifting restrictions   Complete by: As directed    If you can walk 30 minutes without difficulty, it is safe to try more intense activity such as jogging, treadmill, bicycling, low-impact aerobics, swimming, etc. Save the most intensive and strenuous activity for last (Usually 4-8 weeks after surgery) such as sit-ups, heavy lifting, contact  sports, etc.   Refrain from any intense heavy lifting or straining until you are off narcotics for pain control.  You will have off days, but things should improve week-by-week. DO NOT PUSH THROUGH PAIN.  Let pain be your guide: If it hurts to do something, don't do it.  Pain is your body warning you to avoid that activity for another week until the pain goes down.   May shower / Bathe   Complete by: As directed    May walk up steps   Complete by: As directed    Remove dressing in 72 hours   Complete by: As directed    Make sure all dressings are removed by the third day after surgery.  Leave incisions open to air.  OK to cover incisions with gauze or bandages as desired  Sexual Activity Restrictions   Complete by: As directed    You may have sexual intercourse when it is comfortable. If it hurts to do something, stop.      Allergies as of 09/11/2022       Reactions   Omnipaque [iohexol] Nausea And Vomiting   08/28/22- second time vomiting having scan with IV contrast Perfuse vomiting.    Propyphenazone Anaphylaxis, Itching, Rash   Not able to move        Medication List     STOP taking these medications    ondansetron 4 MG disintegrating tablet Commonly known as: ZOFRAN-ODT   ondansetron 4 MG tablet Commonly known as: ZOFRAN       TAKE these medications    gabapentin 300 MG capsule Commonly known as: NEURONTIN Take 300 mg by mouth 2 (two) times daily as needed (neuropathy).   loperamide 2 MG capsule Commonly known as: IMODIUM Take 2 capsules (4 mg total) by mouth 4 (four) times daily - after meals and at bedtime.   OVER THE COUNTER MEDICATION Take 2 capsules by mouth daily. Honeysuckle extract   oxyCODONE 5 MG immediate release tablet Commonly known as: Oxy IR/ROXICODONE Take 1-2 tablets (5-10 mg total) by mouth every 6 (six) hours as needed for moderate pain, severe pain or breakthrough pain.   PHAZYME PO Take 1 tablet by mouth daily as needed (gas).    polycarbophil 625 MG tablet Commonly known as: FIBERCON Take 1 tablet (625 mg total) by mouth 2 (two) times daily.   prochlorperazine 10 MG tablet Commonly known as: COMPAZINE Take 1 tablet (10 mg total) by mouth every 6 (six) hours as needed for nausea or vomiting.   traZODone 100 MG tablet Commonly known as: DESYREL Take 100 mg by mouth at bedtime as needed for sleep.               Durable Medical Equipment  (From admission, onward)           Start     Ordered   09/09/22 1313  For home use only DME Walker rolling  Once       Question Answer Comment  Walker: With [place] Whe[employer]   Patient needs a walker to treat with the following condition Difficulty in walking, not elsewhere classified      09/09/22 1312   09/09/22 1227  For home use only DME Walker rolling  Once       Question Answer Comment  Walker: With [place]   Patient needs a walker to treat with the following condition Difficulty in walking, not elsewhere classified      09/09/22 1226              Discharge Care Instructions  (From admission, onward)           Start     Ordered   09/05/22 0000  Discharge wound care:       Comments: It is good for closed incisions and even open wounds to be washed every day.  Shower every day.  Short baths are fine.  Wash the incisions and wounds clean with soap & water.    You may leave closed incisions open to air if it is dry.   You may cover the incision with clean gauze & replace it after your daily shower for comfort.  DERMABOND:  You have purple skin glue (Dermabond) on your incision(s).  Leave them in place, and they will fall off on their own  like a scab in 2-3 weeks.  You may trim any edges that curl up with clean scissors.   09/05/22 [id number]            Follow-up Information     Ileana Roup, MD Follow up on 09/30/2022.   Specialties: General Surgery, Colon and Rectal Surgery Why: To follow up after your operation Contact  information: [place] [id number] [phone]         [facility]. Call.   Contact information: [place], [place] [address] Phone: [phone]        [place]. Call.   Contact information: [place], [place] [address] Phone: [phone]        [facility]. Call.   Contact information: [address] [place], [place] [address] Phone: [phone]        [facility] Follow up.   Why: Latricia Heft will provide PT in the home after discharge.        [facility]. Call.   Specialty: General Surgery Why: call for apt date and time Contact information: [address] [id number] [place] [phone]                Signed: Rosario Adie [id number], 7:27 AM

## 2022-09-11 NOTE — Progress Notes (Signed)
Reviewed written D/C instructions with pt and all questions answered. Pt verbalized understanding. Pt left in stable condition with all belongings.

## 2022-09-11 NOTE — Consult Note (Addendum)
  [place] Nurse ostomy follow-up consult note Pt had ileostomy surgery performed 3/20.  Stoma is red and viable, 1 3/8 inches, slightly above skin level.  Emptied 50cc liquid brown stool from previous pouch. Pt was able to stretch the barrier ring to fit and apply to the back of the wafer and applied pouch using a hand held mirror and applying a one piece convex pouch.  He was able to open and close velcro to empty. Encouraged patient to empty in the bathroom to practice when out of bed and need to empty when half full.  Skin near tape boarder to inner left lower abd has a red slightly bloody partial thickness wound where previous blisters have ruptured; approx 2X1X.1cm. Foam dressing applied to protect from further injury. Pt c/o pain at the JP site; skin with generalized edema and erythema surrounding the insertion site.  Discussed pouching routines and ordering supplies.  Reviewed dietary precautions and importance of avoiding dehydration. Educational materials left at bedside, along with 5 sets of each supply.  Use supplies:  barrier rings, [place] # [id number], and Roselee Culver # [phone].  Pt may need a belt; one left at the bedside and I demonstrated use. Kellie Simmering # [phone])  Enrolled patient in [place] program: Yes, previously. Thank-you,  Julien Girt MSN, [place], [place], [place], [place]

## 2022-09-11 NOTE — TOC Transition Note (Signed)
Transition of Care [facility]) - CM/SW Discharge Note  Patient Details  Name: Billy Roman MRN: [id number] Date of Birth: 1961-05-25  Transition of Care [facility]) CM/SW Contact:  Sherie Don, LCSW Phone Number: 09/11/2022, 9:21 AM  Clinical Narrative: CSW notified Amy with [facility] of discharge. HHPT orders are in. TOC signing off.    Final next level of care: Home w Home Health Services Barriers to Discharge: Barriers Resolved  Patient Goals and CMS Choice CMS Medicare.gov Compare Post Acute Care list provided to:: Patient Choice offered to / list presented to : Patient  Discharge Plan and Services Additional resources added to the After Visit Summary for   In-house Referral: Clinical Social Work Post Acute Care Choice: Home Health          DME Arranged: Gilford Rile rolling DME Agency: [employer] Date DME Agency Contacted: 09/09/22 Time DME Agency Contacted: [id number] Representative spoke with at DME Agency: Erasmo Downer HH Arranged: PT [place]: [place] Date [place]: 09/06/22 Representative spoke with at [place]: Amy  Social Determinants of Health ([place]) Interventions SDOH Screenings   Food Insecurity: Food Insecurity Present (09/04/2022)  Housing: Low Risk  (09/04/2022)  Transportation Needs: Unmet Transportation Needs (09/04/2022)  Utilities: Not At Risk (09/04/2022)  Alcohol Screen: Low Risk  (08/23/2021)  Depression (PHQ2-9): High Risk (08/23/2021)  Financial Resource Strain: Medium Risk (01/15/2022)  Tobacco Use: High Risk (09/05/2022)   Readmission Risk Interventions     No data to display

## 2022-09-11 NOTE — Progress Notes (Signed)
Patient c/o chills. V/S stable. PRN pain and nausea medication given. Patient need continued education and encouragement with ostomy.

## 2022-09-12 ENCOUNTER — Telehealth: Payer: Self-pay

## 2022-09-12 NOTE — Transitions of Care (Post Inpatient/ED Visit) (Signed)
   09/12/2022  Name: Billy Roman MRN: [id number] DOB: 06-Jan-1961  Today's TOC FU Call Status: Today's TOC FU Call Status:: Successful TOC FU Call Competed TOC FU Call Complete Date: 09/12/22  Transition Care Management Follow-up Telephone Call Date of Discharge: 09/11/22 Discharge Facility: [facility]) Type of Discharge: Inpatient Admission Primary Inpatient Discharge Diagnosis:: Rectal Cancer How have you been since you were released from the hospital?: Better Any questions or concerns?: No  Items Reviewed: Did you receive and understand the discharge instructions provided?: Yes Medications obtained and verified?: Yes (Medications Reviewed) Any new allergies since your discharge?: No Dietary orders reviewed?: Yes Type of Diet Ordered:: Low sodium, heart healthy Do you have support at home?: No  Home Care and Equipment/Supplies: [place] Ordered?: Yes Name of [place]:: [place] set up a time to come to your home?: No EMR reviewed for Home Health Orders:  (Call placed to [facility], Clinical Director [facility], pending return call about scheduling patient) Any new equipment or medical supplies ordered?: Yes Name of Medical supply agency?: [employer] Were you able to get the equipment/medical supplies?: Yes Do you have any questions related to the use of the equipment/supplies?: No  Functional Questionnaire: Do you need assistance with bathing/showering or dressing?: No Do you need assistance with meal preparation?: No Do you need assistance with eating?: No Do you have difficulty maintaining continence: No Do you need assistance with getting out of bed/getting out of a chair/moving?: No Do you have difficulty managing or taking your medications?: No  Follow up appointments reviewed: PCP Follow-up appointment confirmed?: NA (surgeon appt needed) [facility] Follow-up appointment confirmed?: Yes Date of  Specialist follow-up appointment?: 09/12/22 Follow-Up Specialty Provider:: Dr. Dema Severin, [facility] Do you need transportation to your follow-up appointment?: No Do you understand care options if your condition(s) worsen?: Yes-patient verbalized understanding  SDOH Interventions Today    Flowsheet Row Most Recent Value  SDOH Interventions   Housing Interventions Intervention Not Indicated  Utilities Interventions Intervention Not Indicated      Interventions Today    Flowsheet Row Most Recent Value  Chronic Disease   Chronic disease during today's visit Other  [Rectal Cancer]  General Interventions   General Interventions Discussed/Reviewed General Interventions Discussed       Johnney Killian, RN, BSN, CCM Care Management Coordinator [facility] Phone: [phone]: [phone]

## 2022-09-17 ENCOUNTER — Other Ambulatory Visit: Payer: Self-pay

## 2022-09-27 ENCOUNTER — Inpatient Hospital Stay: Payer: 59 | Attending: Radiation Oncology | Admitting: Oncology

## 2022-09-27 VITALS — BP 124/62 | HR 83 | Temp 98.1°F | Resp 18 | Ht 73.0 in | Wt 259.0 lb

## 2022-09-27 DIAGNOSIS — G629 Polyneuropathy, unspecified: Secondary | ICD-10-CM | POA: Diagnosis not present

## 2022-09-27 DIAGNOSIS — Z9221 Personal history of antineoplastic chemotherapy: Secondary | ICD-10-CM | POA: Insufficient documentation

## 2022-09-27 DIAGNOSIS — Z923 Personal history of irradiation: Secondary | ICD-10-CM | POA: Insufficient documentation

## 2022-09-27 DIAGNOSIS — Z85048 Personal history of other malignant neoplasm of rectum, rectosigmoid junction, and anus: Secondary | ICD-10-CM | POA: Diagnosis not present

## 2022-09-27 DIAGNOSIS — Z9889 Other specified postprocedural states: Secondary | ICD-10-CM

## 2022-09-27 DIAGNOSIS — H532 Diplopia: Secondary | ICD-10-CM | POA: Diagnosis not present

## 2022-09-27 DIAGNOSIS — C20 Malignant neoplasm of rectum: Secondary | ICD-10-CM | POA: Diagnosis not present

## 2022-09-27 DIAGNOSIS — F329 Major depressive disorder, single episode, unspecified: Secondary | ICD-10-CM | POA: Diagnosis not present

## 2022-09-27 NOTE — Progress Notes (Signed)
[facility] OFFICE PROGRESS NOTE   Diagnosis: Rectal cancer  INTERVAL HISTORY:   [name] underwent a low anterior resection and diverting loop ileostomy on 09/04/2022.  There was no evidence of metastatic disease on the peritoneum or liver.  [name] continues to have pain at the low transverse incision.  He empties the ileostomy 5-6 times per day.  The stool is partially formed.  He is drinking adequate fluids.  He is scheduled to see Dr. Cliffton Asters next week.  Objective:  Vital signs in last 24 hours:  Blood pressure 124/62, pulse 83, temperature 98.1 F (36.7 C), temperature source Oral, resp. rate 18, height 6\' 1"  (1.854 m), weight 259 lb (117.5 kg), SpO2 98 %.    HEENT: The oral membranes are moist Resp: Lungs clear bilaterally Cardio: Regular rate and rhythm GI: No hepatosplenomegaly, right abdomen ileostomy with semiformed stool, erythema surrounding the ileostomy appliance Vascular: No leg edema Skin: Healed surgical incisions  Portacath/PICC-without erythema  Lab Results:  Lab Results  Component Value Date   WBC 6.5 09/09/2022   HGB 14.5 09/09/2022   HCT 42.9 09/09/2022   MCV 92.1 09/09/2022   PLT 270 09/09/2022   NEUTROABS 2.1 08/27/2022    CMP  Lab Results  Component Value Date   NA 133 (L) 09/09/2022   K 4.3 09/09/2022   CL 99 09/09/2022   CO2 24 09/09/2022   GLUCOSE 120 (H) 09/09/2022   BUN 12 09/09/2022   CREATININE 0.59 (L) 09/09/2022   CALCIUM 8.9 09/09/2022   PROT 7.4 08/27/2022   ALBUMIN 4.1 08/27/2022   AST 32 08/27/2022   ALT 32 08/27/2022   ALKPHOS 55 08/27/2022   BILITOT 0.6 08/27/2022   GFRNONAA >60 09/09/2022   GFRAA 117 07/26/2019    Lab Results  Component Value Date   CEA 2.54 08/23/2022     Medications: I have reviewed the patient's current medications.   Assessment/Plan: Adenocarcinoma of the proximal rectum/distal sigmoid Colonoscopy 11/19/2021-partially obstructing mass at 12-19 cm, biopsy  moderate to poorly differentiated adenocarcinoma, mismatch repair protein expression intact Mildly elevated CEA CTs 11/29/2021-mural thickening in the distal sigmoid/proximal rectum with haziness of the mesorectal fat, mesorectal lymphadenopathy, small pulmonary nodules favored to be benign MRI pelvis 12/03/2021-tumor at 10 cm from the anal verge, T3c, approximately 10 metastatic nodes in the mesorectal sheath and presacral space, no extra mesorectal lymphadenopathy, N2 Cycle 1 FOLFOX 12/19/2021 Cycle 2 FOLFOX 01/02/2022, home Decadron prophylaxis added for delayed nausea Cycle 3 FOLFOX 01/15/2022 Cycle 4 FOLFOX 01/29/2022, 5-FU bolus, leucovorin, and oxaliplatin dose reduced secondary to neutropenia and thrombocytopenia Cycle 5 FOLFOX 02/12/2022, Udenyca Cycle 6 FOLFOX 02/26/2022, Udenyca Cycle 7 FOLFOX 03/12/2022, Udenyca  Cycle 8 FOLFOX 03/26/2022, oxaliplatin held due to neuropathy, Udenyca held Radiation/Xeloda 04/22/2022-06/03/2022 CTs 08/28/2022-persistent submucosal thickening through the distal sigmoid colon and rectum, no mass lesion evident, decrease in size of lymph nodes in the mesorectal sheath with remaining enlarged lymph nodes, no evidence of metastatic disease Low anterior resection/diverting loop ileostomy 09/04/2022-no residual tumor, treatment effect present with no viable cancer cells, ypT0, 0/13 nodes, negative resection margins 2.   Major depressive disorder Change in bowel habits and rectal bleeding secondary to #1 Ongoing tobacco use Report of blurred vision and diplopia Peripheral neuropathy-fingers and left foot      Disposition: [name] is recovering from the low anterior resection.  The pathology is consistent with a complete response to neoadjuvant therapy.  I reviewed the pathology reports with [name].  [name] will  be followed with observation.  He is scheduled to see Dr. Cliffton Asters next week.  He will schedule the ileostomy reversal procedure with Dr.  Cliffton Asters.  [name] will return for an office visit and CEA in 3 months.  [name], MD  09/27/2022  12:18 PM

## 2022-09-29 ENCOUNTER — Other Ambulatory Visit: Payer: Self-pay

## 2022-10-02 ENCOUNTER — Encounter: Payer: Self-pay | Admitting: Surgery

## 2022-10-02 ENCOUNTER — Other Ambulatory Visit: Payer: Self-pay | Admitting: Surgery

## 2022-10-02 DIAGNOSIS — C20 Malignant neoplasm of rectum: Secondary | ICD-10-CM

## 2022-10-07 ENCOUNTER — Ambulatory Visit (HOSPITAL_COMMUNITY)
Admission: RE | Admit: 2022-10-07 | Discharge: 2022-10-07 | Disposition: A | Discharge status: Home / Self Care | Payer: 59 | Source: Ambulatory Visit | Attending: Nurse Practitioner | Admitting: Nurse Practitioner

## 2022-10-07 DIAGNOSIS — L24B3 Irritant contact dermatitis related to fecal or urinary stoma or fistula: Secondary | ICD-10-CM | POA: Diagnosis not present

## 2022-10-07 DIAGNOSIS — Z432 Encounter for attention to ileostomy: Secondary | ICD-10-CM

## 2022-10-07 NOTE — Progress Notes (Signed)
[facility]   Reason for visit:  RLQ ileostomy  HPI:  Ileostomy  frequent leaks Past Medical History:  Diagnosis Date   Anxiety    Arrhythmia    RBBB   Arthritis    Complication of anesthesia    Nausea   Dysrhythmia    [name], recurrent severe, without psychosis (HCC) 08/23/2021   Pneumonia    Rectal cancer (HCC) 11/20/2021   Sleep apnea    Family History  Adopted: Yes  Problem Relation Age of Onset   Colon cancer Neg Hx    Stomach cancer Neg Hx    Esophageal cancer Neg Hx    Allergies  Allergen Reactions   Omnipaque [Iohexol] Nausea And Vomiting    08/28/22- second time vomiting having scan with IV contrast Perfuse vomiting.    Propyphenazone Anaphylaxis, Itching and Rash    Not able to move   Current Outpatient Medications  Medication Sig Dispense Refill Last Dose   doxycycline (MONODOX) 100 MG capsule Take 100 mg by mouth 2 (two) times daily.      gabapentin (NEURONTIN) 300 MG capsule Take 300 mg by mouth 2 (two) times daily as needed (neuropathy).      loperamide (IMODIUM) 2 MG capsule Take 2 capsules (4 mg total) by mouth 4 (four) times daily - after meals and at bedtime. 240 capsule 0    methocarbamol (ROBAXIN) 500 MG tablet Take 500 mg by mouth every 8 (eight) hours as needed.      OVER THE COUNTER MEDICATION Take 2 capsules by mouth daily. Honeysuckle extract      oxyCODONE (OXY IR/ROXICODONE) 5 MG immediate release tablet Take 1-2 tablets (5-10 mg total) by mouth every 6 (six) hours as needed for moderate pain, severe pain or breakthrough pain. 30 tablet 0    polycarbophil (FIBERCON) 625 MG tablet Take 1 tablet (625 mg total) by mouth 2 (two) times daily.      prochlorperazine (COMPAZINE) 10 MG tablet Take 1 tablet (10 mg total) by mouth every 6 (six) hours as needed for nausea or vomiting. 30 tablet 0    Simethicone (PHAZYME PO) Take 1 tablet by mouth daily as needed (gas).      traZODone (DESYREL) 100 MG tablet Take 100  mg by mouth at bedtime as needed for sleep.      No current facility-administered medications for this encounter.   ROS  Review of Systems  Gastrointestinal:        Ileostomy  Musculoskeletal:  Positive for back pain and gait problem.  Skin:  Positive for color change and rash.       Peristomal breakdown and some medical adhesive related skin injury to perimeter of pouching area from 10 to 12 o'clock.    Psychiatric/Behavioral:  Positive for behavioral problems.        States he had an "argument" with a physician on last admission.  Requesting transportation to his upcoming colonoscopy.  I sent secure chat to social work who was not aware of any program to assist with outpatient transport.  Relayed this to patient.  He was going to call MD office (GI) to discuss options.   All other systems reviewed and are negative.  Vital signs:  BP 117/87   Pulse 85   Temp 98 F (36.7 C)   Resp 18   SpO2 99%  Exam:  Physical Exam Vitals reviewed.  Abdominal:     Palpations: Abdomen is soft.  Skin:    General:  Skin is warm and dry.     Findings: Rash present.  Neurological:     Mental Status: He is alert and oriented to person, place, and time.     Stoma type/location:  RLQ ileostomy   Stomal assessment/size:  1 1/3" round pink andmoist, slightly budded Peristomal assessment:  some medical adhesive related skin injury Treatment options for stomal/peristomal skin: Barrier ring and 1 piece convex pouch.  Added ostomy belt.  Output: soft brown stool  Liquid at times, leaks mostly at night.  Ostomy pouching: 1pc. Convex with barrier ring and added barrier seals for support.  Education provided:  Pouch change performed. Demonstrated and reinforced crusting with stoma powder and skin prep to protect denuded peristomal skin.     Impression/dx  Ileostomy Contact dermatitis Discussion   WIll update [facility] order regarding supplies.  Plan  See back as needed.     Visit time: 45 minutes.    Maple Hudson FNP-BC

## 2022-10-09 ENCOUNTER — Encounter (HOSPITAL_COMMUNITY): Payer: Self-pay | Admitting: Surgery

## 2022-10-13 DIAGNOSIS — L24B3 Irritant contact dermatitis related to fecal or urinary stoma or fistula: Secondary | ICD-10-CM | POA: Insufficient documentation

## 2022-10-13 NOTE — Discharge Instructions (Signed)
Call office and let me know if I need to update supply order.

## 2022-10-16 NOTE — Anesthesia Preprocedure Evaluation (Addendum)
Anesthesia Evaluation  Patient identified by MRN, date of birth, ID band Patient awake    Reviewed: Allergy & Precautions, H&P , NPO status , Patient's Chart, lab work & pertinent test results  Airway Mallampati: II  TM Distance: >3 FB Neck ROM: Full    Dental no notable dental hx. (+) Teeth Intact, Dental Advisory Given   Pulmonary sleep apnea , Current Smoker and Patient abstained from smoking.   Pulmonary exam normal breath sounds clear to auscultation       Cardiovascular negative cardio ROS Normal cardiovascular exam+ dysrhythmias (RBB)  Rhythm:Regular Rate:Normal  09/2022 Echo NL EF   Neuro/Psych negative neurological ROS  negative psych ROS   GI/Hepatic negative GI ROS, Neg liver ROS,,,Rectal ca   Endo/Other  negative endocrine ROS    Renal/GU negative Renal ROS  negative genitourinary   Musculoskeletal negative musculoskeletal ROS (+) Arthritis ,  Chronic pain   Abdominal   Peds negative pediatric ROS (+)  Hematology negative hematology ROS (+) Lab Results      Component                Value               Date                      WBC                      6.5                 09/09/2022                HGB                      14.5                09/09/2022                HCT                      42.9                09/09/2022                MCV                      92.1                09/09/2022                PLT                      270                 09/09/2022              Anesthesia Other Findings All: see list  Reproductive/Obstetrics negative OB ROS                             Anesthesia Physical Anesthesia Plan  ASA: 3  Anesthesia Plan: MAC   Post-op Pain Management: Minimal or no pain anticipated   Induction:   PONV Risk Score and Plan: Treatment may vary due to age or medical condition and Propofol infusion  Airway Management Planned: Nasal Cannula and Natural  Airway  Additional Equipment: None  Intra-op Plan:  Post-operative Plan:   Informed Consent: I have reviewed the patients History and Physical, chart, labs and discussed the procedure including the risks, benefits and alternatives for the proposed anesthesia with the patient or authorized representative who has indicated his/her understanding and acceptance.     Dental advisory given  Plan Discussed with:   Anesthesia Plan Comments:        Anesthesia Quick Evaluation

## 2022-10-17 ENCOUNTER — Encounter (HOSPITAL_COMMUNITY)
Admission: RE | Disposition: A | Discharge status: Home / Self Care | Payer: Self-pay | Source: Ambulatory Visit | Attending: Surgery

## 2022-10-17 ENCOUNTER — Encounter (HOSPITAL_COMMUNITY): Payer: Self-pay | Admitting: Surgery

## 2022-10-17 ENCOUNTER — Ambulatory Visit (HOSPITAL_BASED_OUTPATIENT_CLINIC_OR_DEPARTMENT_OTHER): Payer: 59 | Admitting: Anesthesiology

## 2022-10-17 ENCOUNTER — Other Ambulatory Visit: Payer: Self-pay

## 2022-10-17 ENCOUNTER — Ambulatory Visit (HOSPITAL_COMMUNITY)
Admission: RE | Admit: 2022-10-17 | Discharge: 2022-10-17 | Disposition: A | Discharge status: Home / Self Care | Payer: 59 | Source: Ambulatory Visit | Attending: Surgery | Admitting: Surgery

## 2022-10-17 ENCOUNTER — Ambulatory Visit (HOSPITAL_COMMUNITY): Payer: 59 | Admitting: Anesthesiology

## 2022-10-17 DIAGNOSIS — D126 Benign neoplasm of colon, unspecified: Secondary | ICD-10-CM | POA: Diagnosis not present

## 2022-10-17 DIAGNOSIS — Z85048 Personal history of other malignant neoplasm of rectum, rectosigmoid junction, and anus: Secondary | ICD-10-CM | POA: Insufficient documentation

## 2022-10-17 DIAGNOSIS — Z9221 Personal history of antineoplastic chemotherapy: Secondary | ICD-10-CM | POA: Diagnosis not present

## 2022-10-17 DIAGNOSIS — Z923 Personal history of irradiation: Secondary | ICD-10-CM | POA: Insufficient documentation

## 2022-10-17 DIAGNOSIS — G4730 Sleep apnea, unspecified: Secondary | ICD-10-CM

## 2022-10-17 DIAGNOSIS — F17210 Nicotine dependence, cigarettes, uncomplicated: Secondary | ICD-10-CM | POA: Diagnosis not present

## 2022-10-17 DIAGNOSIS — Z932 Ileostomy status: Secondary | ICD-10-CM | POA: Diagnosis not present

## 2022-10-17 DIAGNOSIS — I4510 Unspecified right bundle-branch block: Secondary | ICD-10-CM | POA: Diagnosis not present

## 2022-10-17 DIAGNOSIS — Z01818 Encounter for other preprocedural examination: Secondary | ICD-10-CM | POA: Insufficient documentation

## 2022-10-17 DIAGNOSIS — F17200 Nicotine dependence, unspecified, uncomplicated: Secondary | ICD-10-CM | POA: Diagnosis not present

## 2022-10-17 DIAGNOSIS — Z980 Intestinal bypass and anastomosis status: Secondary | ICD-10-CM | POA: Insufficient documentation

## 2022-10-17 DIAGNOSIS — K635 Polyp of colon: Secondary | ICD-10-CM | POA: Diagnosis not present

## 2022-10-17 DIAGNOSIS — D124 Benign neoplasm of descending colon: Secondary | ICD-10-CM | POA: Diagnosis not present

## 2022-10-17 HISTORY — PX: BIOPSY: SHX5522

## 2022-10-17 HISTORY — PX: FLEXIBLE SIGMOIDOSCOPY: SHX5431

## 2022-10-17 HISTORY — PX: POLYPECTOMY: SHX5525

## 2022-10-17 SURGERY — SIGMOIDOSCOPY, FLEXIBLE
Anesthesia: Monitor Anesthesia Care

## 2022-10-17 MED ORDER — LACTATED RINGERS IV SOLN: INTRAVENOUS | Status: DC

## 2022-10-17 MED ORDER — PROPOFOL 500 MG/50ML IV EMUL
INTRAVENOUS | Status: DC | PRN
  Administered 2022-10-17: 100 ug/kg/min via INTRAVENOUS

## 2022-10-17 MED ORDER — PROPOFOL 1000 MG/100ML IV EMUL
INTRAVENOUS | Status: AC
  Filled 2022-10-17: qty 100

## 2022-10-17 MED ORDER — ALBUMIN HUMAN 5 % IV SOLN
INTRAVENOUS | Status: AC
  Filled 2022-10-17: qty 250

## 2022-10-17 MED ORDER — LIDOCAINE 2% (20 MG/ML) 5 ML SYRINGE
INTRAMUSCULAR | Status: DC | PRN
  Administered 2022-10-17: 100 mg via INTRAVENOUS

## 2022-10-17 MED ORDER — PROPOFOL 500 MG/50ML IV EMUL
INTRAVENOUS | Status: AC
  Filled 2022-10-17: qty 50

## 2022-10-17 MED ORDER — PROPOFOL 10 MG/ML IV BOLUS
INTRAVENOUS | Status: AC
  Filled 2022-10-17: qty 20

## 2022-10-17 MED ORDER — PROPOFOL 10 MG/ML IV BOLUS
INTRAVENOUS | Status: DC | PRN
  Administered 2022-10-17 (×3): 20 mg via INTRAVENOUS

## 2022-10-17 NOTE — Transfer of Care (Signed)
Immediate Anesthesia Transfer of Care Note  Patient: Billy Roman  Procedure(s) Performed: FLEXIBLE SIGMOIDOSCOPY POLYPECTOMY BIOPSY  Patient Location: Endoscopy Unit  Anesthesia Type:MAC  Level of Consciousness: drowsy  Airway & Oxygen Therapy: Patient Spontanous Breathing and Patient connected to face mask oxygen  Post-op Assessment: Report given to RN and Post -op Vital signs reviewed and stable  Post vital signs: Reviewed and stable  Last Vitals:  Vitals Value Taken Time  BP    Temp    Pulse 63 10/17/22 0805  Resp 15 10/17/22 0805  SpO2 100 % 10/17/22 0805  Vitals shown include unvalidated device data.  Last Pain:  Vitals:   10/17/22 0709  TempSrc: Temporal  PainSc: 4          Complications: No notable events documented.

## 2022-10-17 NOTE — H&P (Signed)
CC: Here today for flexible sigmoidoscopy  HPI: Billy Roman is an 62 y.o. male  with history of tobacco use, recent tremors (follows with neurology), whom was seen in the office initially for evaluation of rectal cancer.  He underwent colonoscopy 11/19/21 - - Preparation of the colon was fair. - Malignant partially obstructing tumor in the rectum. Biopsied. 12-19 cm estimated location - not palpable w/ finger. TATTOO x 2 distal to tumor placed in rectum w/ SPOT - Two 5 mm polyps in the sigmoid colon and in the ascending colon, removed with a cold snare. Resected and retrieved. - Diverticulosis in the sigmoid colon. - External and internal hemorrhoids. - The examination was otherwise normal on direct and retroflexion views.  PATH: 1. Sigmoid Colon Polyp, x 1 and ascending x 1 FINDINGS CONSISTENT WITH TUBULAR ADENOMA. NO HIGH-GRADE DYSPLASIA OR MALIGNANCY IS SEEN. 2. Rectum, biopsy, mass :MODERATELY TO POORLY DIFFERENTIATED ADENOCARCINOMA.  CEA 11/19/21 - 2.9 CT CAP 11/30/21 1. Extensive mural thickening in the distal sigmoid colon and proximal rectum suggesting infiltrative colorectal neoplasm with haziness of the mesorectal fat and adjacent mesorectal lymphadenopathy concerning for local direct invasion and nodal disease. 2. Small pulmonary nodules measuring 5 mm or less in the lungs, nonspecific. Metastatic disease is not excluded, but not favored on the basis of today's examination. Attention on follow-up studies is recommended to ensure stability. 3. No definite metastatic disease to the abdomen. 4. Aortic atherosclerosis, in addition to three-vessel coronary artery disease. Please note that although the presence of coronary artery calcium documents the presence of coronary artery disease, the severity of this disease and any potential stenosis cannot be assessed on this non-gated CT examination. Assessment for potential risk factor modification, dietary therapy or  pharmacologic therapy may be warranted, if clinically indicated. 5. Additional incidental findings, as above.  MRI Pelvis 12/03/21 - pending read. I was able to only see the images and this does appear to be in the mid and upper rectum encroaching on the sigmoid as well.  He is here today with his daughter whom is a previous patient of our practice, having had bariatric surgery with Dr. Sheliah Hatch.  PAC placement 12/17/21 Underwent systemic therapy with Dr. Truett Perna followed by Xeloda/radiation having completed FOLFOX successfully. PAC removed  XRT started 04/22/22, completed 05/31/22.  CT CAP 08/28/22 1. Interval decrease in size of mesorectal sheath lymph nodes. 2. Persistent submucosal thickening of the distal sigmoid colon and rectum. 3. Haziness in the presacral fat related to radiation treatment 4. No evidence of metastatic disease in the abdomen pelvis. 5. No skeletal metastasis. 6. Aortic Atherosclerosis (ICD10-I70.0).  OR 09/04/22 Robotic assisted low anterior resection with diverting loop ileostomy Intraoperative assessment of perfusion using ICG fluorescence imaging Flexible sigmoidoscopy Bilateral transversus abdominus plane (TAP) blocks  FINDINGS: Empty right inguinal hernia. Tattoo in rectum with mass/annular scar proximal to it. No evident metastatic disease on visceral parietal peritoneum or liver. A well perfused, tension free, hemostatic, air tight 29 mm EEA colorectal anastomosis fashioned 10 cm from the anal verge by flexible sigmoidoscopy.  PATH A. COLON, RECTOSIGMOID, RESECTION: - Benign colonic mucosa with no residual tumor, ypT0 - Treatment effect present with no viable cancer cells (complete response, score 0) - Margins negative for dysplasia or carcinoma - Twelve benign lymph nodes (0/12)  B. MESORECTUM MARGIN, ANTERIOR, EXCISION: - Benign fibroadipose tissue - Negative for dysplasia or carcinoma  C. ADDITIONAL RECTUM, RESECTION: - Benign colonic mucosa  with no significant pathologic changes - 1 benign lymph node (  0/1)  D. FINAL DISTAL MARGIN: - Benign colonic mucosa with mild hyperplastic change - Negative for dysplasia or carcinoma  He recovered from surgery but remained in the hospital due to immobility status, difficulty with morning to care for his ostomy, high ileostomy output. He was ultimately discharged on postop day #7. His drain was removed prior to discharge. He was seen back in the office the following day as he had some drainage from his drain site immediately after it is removed.  INTERVAL HX He returns to see me for follow-up 09/30/22 having recovered well. He has been doing well. He denies any changes in his health or health hx since we met 4/15 in office. States he is ready for flexible sigmoidoscopy.  Past Medical History:  Diagnosis Date   Anxiety    Arrhythmia    RBBB   Arthritis    Complication of anesthesia    Nausea   Dysrhythmia    [name], recurrent severe, without psychosis (HCC) 08/23/2021   Pneumonia    Rectal cancer (HCC) 11/20/2021   Sleep apnea     Past Surgical History:  Procedure Laterality Date   DIVERTING ILEOSTOMY N/A 09/04/2022   Procedure: LOOP ILEOSTOMY;  Surgeon: Andria Meuse, MD;  Location: [facility];  Service: General;  Laterality: N/A;   FLEXIBLE SIGMOIDOSCOPY N/A 09/04/2022   Procedure: Arnell Sieving;  Surgeon: Andria Meuse, MD;  Location: [facility];  Service: General;  Laterality: N/A;   OPERATIVE ULTRASOUND Right 12/17/2021   Procedure: OPERATIVE ULTRASOUND;  Surgeon: Andria Meuse, MD;  Location: [facility];  Service: General;  Laterality: Right;   PORT-A-CATH REMOVAL N/A 05/20/2022   Procedure: REMOVAL PORT-A-CATH;  Surgeon: Andria Meuse, MD;  Location: [facility];  Service: General;  Laterality: N/A;   PORTACATH PLACEMENT Right 12/17/2021   Procedure: INSERTION PORT-A-CATH;  Surgeon: Andria Meuse, MD;   Location: [facility];  Service: General;  Laterality: Right;   RHINOPLASTY     SEPTOPLASTY     SHOULDER ARTHROSCOPY Left    SHOULDER ARTHROSCOPY WITH OPEN ROTATOR CUFF REPAIR AND DISTAL CLAVICLE ACROMINECTOMY Right 02/01/2021   Procedure: RIGHT SHOULDER ARTHROSCOPY WITH MINI OPEN ROTATOR CUFF REPAIR AND DISTAL CLAVICLE EXCISION;  Surgeon: Juanell Fairly, MD;  Location: [facility];  Service: Orthopedics;  Laterality: Right;   SPINE SURGERY     lumbar laminectomy   WRIST SURGERY Bilateral    corpal tunnel   XI ROBOTIC ASSISTED LOWER ANTERIOR RESECTION N/A 09/04/2022   Procedure: ROBOTIC LOW ANTERIOR RESECTION, BILATERAL TAP BLOCK, AND INOPERATIVE ASSESSMENT OF PERFUSION USING FIREFLY DYE;  Surgeon: Andria Meuse, MD;  Location: [facility];  Service: General;  Laterality: N/A;    Family History  Adopted: Yes  Problem Relation Age of Onset   Colon cancer Neg Hx    Stomach cancer Neg Hx    Esophageal cancer Neg Hx     Social:  reports that he has been smoking cigarettes. He has a 7.50 pack-year smoking history. He has never been exposed to tobacco smoke. He quit smokeless tobacco use about 34 years ago.  His smokeless tobacco use included chew. He reports that he does not currently use alcohol. He reports current drug use. Frequency: 1.00 time per week. Drug: Marijuana.  Allergies:  Allergies  Allergen Reactions   Omnipaque [Iohexol] Nausea And Vomiting    08/28/22- second time vomiting having scan with IV contrast Perfuse vomiting.    Propyphenazone Anaphylaxis, Itching and Rash  Not able to move    Medications: I have reviewed the patient's current medications.  No results found for this or any previous visit (from the past 48 hour(s)).  No results found.  ROS - all of the below systems have been reviewed with the patient and positives are indicated with bold text General: chills, fever or night sweats Eyes: blurry vision or double vision ENT: epistaxis or sore  throat Allergy/Immunology: itchy/watery eyes or nasal congestion Hematologic/Lymphatic: bleeding problems, blood clots or swollen lymph nodes Endocrine: temperature intolerance or unexpected weight changes Breast: new or changing breast lumps or nipple discharge Resp: cough, shortness of breath, or wheezing CV: chest pain or dyspnea on exertion GI: as per HPI GU: dysuria, trouble voiding, or hematuria MSK: joint pain or joint stiffness Neuro: TIA or stroke symptoms Derm: pruritus and skin lesion changes Psych: anxiety and depression  PE Blood pressure 130/75, pulse 72, temperature 98.1 F (36.7 C), temperature source Temporal, resp. rate 16, height 6\' 1"  (1.854 m), weight 117.9 kg, SpO2 98 %. Constitutional: NAD; conversant Eyes: Moist conjunctiva Lungs: Normal respiratory effort CV: RRR GI: Abd soft, NT/ND Psychiatric: Appropriate affect  No results found for this or any previous visit (from the past 48 hour(s)).  No results found.   A/P: Billy Roman is an 62 y.o. male with hx of tobacco use, recent tremors here for follow-up of recently diagnosed mid and upper rectal cancer - here for flexible sigmoidoscopy - hx of rectal cancer and surgery - preop evaluation before loop ileostomy reversal.  CT CAP 11/2021 -no evidence of metastatic disease but there are suspicious appearing lymph nodes. 5 mm pulmonary nodules that will need surveillance.  MRI pelvis 12/03/2021- [id number] with threatened CRM from lymph node TNT with Dr. Truett Perna, completed systemic treatment and chemo/XRT 12/15 CT CAP 08/28/22 - no progressive disease or metastatic disease  Robotic LAR/DLI 09/04/22 Path -complete pathologic response - [id number]  -The anatomy and physiology of the GI tract was discussed with the patient as it pertains to post-surgical anatomy -We have discussed flexible sigmoidoscopy for anastomotic evaluation before planned loop ileostomy reversal -The planned procedure, material risks  (including, but not limited to, pain, bleeding, infection, sdamage to surrounding structures- spleen, colon perforation, leak from anastomosis, need for additional procedures, worsening of pre-existing medical conditions, hernia, recurrence, pneumonia, heart attack, stroke, death) benefits and alternatives to surgery were discussed at length. The patient's questions were answered to his satisfaction, he voiced understanding and elected to proceed with surgery  Marin Olp, MD [facility]

## 2022-10-17 NOTE — Op Note (Signed)
[facility] Patient Name: Billy Roman Procedure Date: 10/17/2022 MRN: [id number] Attending MD: Andria Meuse MD, MD,  Date of Birth: 1960-07-05 CSN: [id number] Age: 62 Admit Type: Outpatient Procedure:                Flexible Sigmoidoscopy Indications:              Personal history of malignant rectal neoplasm,                            Preoperative assessment Providers:                Stephanie Coup. Shizuo Biskup MD, MD, Fransisca Connors, Harrington Challenger, Technician Referring MD:              Medicines:                Monitored Anesthesia Care Complications:            No immediate complications. Estimated blood loss:                            Minimal. Estimated Blood Loss:     Estimated blood loss was minimal. Procedure:                Pre-Anesthesia Assessment:                           - Prior to the procedure, a History and Physical                            was performed, and patient medications, allergies                            and sensitivities were reviewed. The patient's                            tolerance of previous anesthesia was reviewed.                           - The risks and benefits of the procedure and the                            sedation options and risks were discussed with the                            patient. All questions were answered and informed                            consent was obtained.                           - Patient identification and proposed procedure                            were verified prior to the procedure. The procedure  was verified in the pre-procedure area in the                            endoscopy suite.                           After obtaining informed consent, the scope was                            passed under direct vision. The [id number] ([id number])                            Olympus colonoscope was introduced through the anus                             and advanced to the the splenic flexure. Scope In: 7:43:00 AM Scope Out: 8:00:30 AM Total Procedure Duration: 0 hours 17 minutes 30 seconds  Findings:      The perianal and digital rectal examinations were normal. Pertinent       negatives include normal sphincter tone.      A 4 mm polyp was found in the mid descending colon. The polyp was       semi-pedunculated. The polyp was removed with a cold snare. Resection       and retrieval were complete. Verification of patient identification for       the specimen was done by the physician, nurse and technician using the       patient's name, birth date and medical record number. Estimated blood       loss was minimal.      There was evidence of a prior end-to-end colo-rectal anastomosis in the       mid rectum. This was patent and was characterized by healthy appearing       mucosa and an intact staple line. The anastomosis was traversed.      The exam was otherwise without abnormality. Impression:               - No specimens collected. Moderate Sedation:      Not Applicable - Patient had care per Anesthesia. Recommendation:           - Continue present medications.                           - Repeat colonoscopy as previously scheduled for                            surveillance based on pathology results with                            gastroenterology Procedure Code(s):        --- Professional ---                           [phone], Sigmoidoscopy, flexible; with removal of                            tumor(s), polyp(s), or other lesion(s) by snare  technique Diagnosis Code(s):        --- Professional ---                           Z85.048, Personal history of other malignant                            neoplasm of rectum, rectosigmoid junction, and anus                           Z01.818, Encounter for other preprocedural                            examination CPT copyright 2022 American Medical  Association. All rights reserved. The codes documented in this report are preliminary and upon coder review may  be revised to meet current compliance requirements. Marin Olp, MD Andria Meuse MD, MD 10/17/2022 8:26:56 AM This report has been signed electronically. Number of Addenda: 0

## 2022-10-17 NOTE — Anesthesia Postprocedure Evaluation (Signed)
Anesthesia Post Note  Patient: Billy Roman  Procedure(s) Performed: FLEXIBLE SIGMOIDOSCOPY POLYPECTOMY BIOPSY     Patient location during evaluation: Endoscopy Anesthesia Type: MAC Level of consciousness: awake and alert Pain management: pain level controlled Vital Signs Assessment: post-procedure vital signs reviewed and stable Respiratory status: spontaneous breathing, nonlabored ventilation, respiratory function stable and patient connected to nasal cannula oxygen Cardiovascular status: blood pressure returned to baseline and stable Postop Assessment: no apparent nausea or vomiting Anesthetic complications: no  No notable events documented.  Last Vitals:  Vitals:   10/17/22 0815 10/17/22 0825  BP: 126/66 114/81  Pulse: 65 62  Resp: 20 19  Temp:    SpO2: 95% 98%    Last Pain:  Vitals:   10/17/22 0825  TempSrc:   PainSc: 4                  Trevor Iha

## 2022-10-17 NOTE — Anesthesia Procedure Notes (Signed)
Date/Time: 10/17/2022 7:34 AM  Performed by: Florene Route, CRNAOxygen Delivery Method: Simple face mask

## 2022-10-18 ENCOUNTER — Ambulatory Visit
Admission: RE | Admit: 2022-10-18 | Discharge: 2022-10-18 | Disposition: A | Discharge status: Home / Self Care | Payer: 59 | Source: Ambulatory Visit | Attending: Surgery | Admitting: Surgery

## 2022-10-18 DIAGNOSIS — C20 Malignant neoplasm of rectum: Secondary | ICD-10-CM

## 2022-10-18 LAB — SURGICAL PATHOLOGY

## 2022-10-21 ENCOUNTER — Encounter (HOSPITAL_COMMUNITY): Payer: Self-pay | Admitting: Surgery

## 2022-10-24 ENCOUNTER — Other Ambulatory Visit: Payer: Self-pay

## 2022-11-04 ENCOUNTER — Encounter: Payer: Self-pay | Admitting: Oncology

## 2022-11-15 ENCOUNTER — Encounter (HOSPITAL_COMMUNITY): Payer: Self-pay

## 2022-11-20 ENCOUNTER — Other Ambulatory Visit: Payer: Self-pay

## 2022-11-20 DIAGNOSIS — L988 Other specified disorders of the skin and subcutaneous tissue: Secondary | ICD-10-CM | POA: Diagnosis not present

## 2022-12-04 DIAGNOSIS — Z85038 Personal history of other malignant neoplasm of large intestine: Secondary | ICD-10-CM | POA: Diagnosis not present

## 2022-12-04 DIAGNOSIS — Z932 Ileostomy status: Secondary | ICD-10-CM | POA: Diagnosis not present

## 2022-12-05 NOTE — Progress Notes (Signed)
Sent message, via epic in basket, requesting orders in epic from surgeon.  

## 2022-12-06 ENCOUNTER — Encounter: Payer: Self-pay | Admitting: Oncology

## 2022-12-09 ENCOUNTER — Ambulatory Visit (INDEPENDENT_AMBULATORY_CARE_PROVIDER_SITE_OTHER): Payer: 59 | Admitting: Neurology

## 2022-12-09 ENCOUNTER — Ambulatory Visit: Payer: Self-pay | Admitting: Surgery

## 2022-12-09 ENCOUNTER — Encounter: Payer: Self-pay | Admitting: Neurology

## 2022-12-09 VITALS — BP 119/76 | HR 73 | Ht 73.0 in | Wt 256.5 lb

## 2022-12-09 DIAGNOSIS — G250 Essential tremor: Secondary | ICD-10-CM | POA: Diagnosis not present

## 2022-12-09 DIAGNOSIS — R413 Other amnesia: Secondary | ICD-10-CM | POA: Diagnosis not present

## 2022-12-09 MED ORDER — TRAZODONE HCL 100 MG PO TABS: 100.0000 mg | ORAL_TABLET | Freq: Every evening | ORAL | 11 refills | Status: DC | PRN

## 2022-12-09 MED ORDER — GABAPENTIN 300 MG PO CAPS: 300.0000 mg | ORAL_CAPSULE | Freq: Two times a day (BID) | ORAL | 11 refills | Status: DC | PRN

## 2022-12-09 NOTE — Progress Notes (Addendum)
COVID Vaccine Completed:  Date of COVID positive in last 90 days:  PCP - Ricky Stabs, NP Cardiologist - Nicki Guadalajara, MD Neurologist - Knute Neu, MD  Chest x-ray - 06-01-22 Epic EKG -  Stress Test -  ECHO - 10-01-21 Epic Cardiac Cath -  Pacemaker/ICD device last checked: Spinal Cord Stimulator:  Bowel Prep -   Sleep Study -  CPAP -   Fasting Blood Sugar -  Checks Blood Sugar _____ times a day  Last dose of GLP1 agonist-  N/A GLP1 instructions:  N/A   Last dose of SGLT-2 inhibitors-  N/A SGLT-2 instructions: N/A   Blood Thinner Instructions:  Time Aspirin Instructions: Last Dose:  Activity level:  Can go up a flight of stairs and perform activities of daily living without stopping and without symptoms of chest pain or shortness of breath.  Able to exercise without symptoms  Unable to go up a flight of stairs without symptoms of     Anesthesia review:  Incomplete RBBB, 1st degree AV block, LVH evaluated by cardiology.    Patient denies shortness of breath, fever, cough and chest pain at PAT appointment  Patient verbalized understanding of instructions that were given to them at the PAT appointment. Patient was also instructed that they will need to review over the PAT instructions again at home before surgery.

## 2022-12-09 NOTE — Progress Notes (Signed)
ASSESSMENT AND PLAN 62 y.o. 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   HISTORY: Billy Roman is a 62 year old male, seen in request by his primary care physician nurse practitioner Zonia Kief, Amy for evaluation of bilateral hands tremor, initial evaluation was on September 18, 2021   I reviewed and summarized the referring note. PMHX. Depression Chronic insominia trazodone for sleep as needed,    Patient works as a Veterinary surgeon support, but recently change to a different job, which has caused a lot of stress on him, during the training in February 2023, he describes difficulty keeping up with the requirement,   This is concurrent with his worsening depression  He suffered depression for few years, especially since he separated from his wife in 2018, complains of worsening depression at the beginning of 2023  Then he began to notice mild bilateral hands tremor, intermittent, also complains of memory loss, difficulty keeping up with his stroke,   UDS March 2023 was positive for marijuana,  oxazepam  UPDATE December 09 2022: He finished radiation therapy in Dec 2023, underwent robotic assisted low anterior resection with diverting loop ileostomy in March 2024, recovering well,  He lives alone, complains of fatigue, poor sleep quality, taking trazodone as needed was helpful,  Laboratory evaluation in April RPR, HIV, CK, ANA, sed rate, CRP, MM panel, thyroid peroxidase antibody, thyroglobulin antibody, drug screen, ethanol were unremarkable with the exception of positive for marijuana  CT head 02/02/22 was unremarkable.  MRI of the brain with and without contrast March 2023 was unremarkable.   REVIEW OF SYSTEMS: Out of a complete 14 system review of symptoms, the patient complains only of the following symptoms, and all other reviewed systems are negative.  See HPI  PHYSICAL EXAM  Vitals:   12/09/22 1300  BP: 119/76  Pulse: 73  Weight: 256 lb 8 oz (116.3 kg)  Height: 6\' 1"  (1.854 m)   Body mass index is 33.84 kg/m.    12/09/2022    1:04 PM 03/13/2022    8:22 AM 09/18/2021    2:40 PM  Montreal Cognitive Assessment   Visuospatial/ Executive (0/5) 3 4 5   Naming (0/3) 2 3 3   Attention: Read list of digits (0/2) 1 1 2   Attention: Read list of letters (0/1) 0 1 1  Attention: Serial 7 subtraction starting at 100 (0/3) 0 0 3  Language: Repeat phrase (0/2) 1 2 1   Language : Fluency (0/1) 0 0 0  Abstraction (0/2) 2 2 2   Delayed Recall (0/5) 2 0 2  Orientation (0/6) 5 4 5   Total 16 17 24    Generalized: Well developed, in no acute distress, depressed looking middle-age male, unkempt, frequent left shoulder shrugging Neurological  examination  Mentation: Alert, oriented, some trouble focusing, able to follow exam commands,   Cranial nerve II-XII: Pupils were equal round reactive to light. Extraocular movements were full, visual field were full on confrontational test. Facial sensation and strength were normal. Head turning and shoulder shrug were normal and symmetric, but  hesitant to lift the left shoulder. Motor: Normal strength, mild bilateral hand posturing tremor, no bradykinesia no rigidity, frequent left shoulder shrugging Sensory: Sensory testing is intact to soft touch on all 4 extremities. No evidence of extinction is noted.  Coordination: Cerebellar testing reveals good finger-nose-finger and heel-to-shin bilaterally.  Mild tremor with finger-nose-finger bilaterally, slightly more on the left.  Mild tremor translated to spiral drawl. Gait and station: Push-up to get up from seated position, steady, endent, he is cautious Reflexes: Deep tendon reflexes are symmetric and normal bilaterally.   ALLERGIES: Allergies  Allergen Reactions   Omnipaque [Iohexol] Nausea And Vomiting    08/28/22- second time vomiting having scan with IV contrast Perfuse vomiting.    Propyphenazone Anaphylaxis, Itching and Rash    Not able to move    HOME MEDICATIONS: Outpatient Medications Prior to Visit  Medication Sig Dispense Refill   gabapentin (NEURONTIN) 300 MG capsule Take 300 mg by mouth 2 (two) times daily as needed (neuropathy).     loperamide (IMODIUM) 2 MG capsule Take 2 capsules (4 mg total) by mouth 4 (four) times daily - after meals and at bedtime. 240 capsule 0   methocarbamol (ROBAXIN) 500 MG tablet Take 500 mg by mouth every 8 (eight) hours as needed for muscle spasms.     OVER THE COUNTER MEDICATION Take 2 capsules by mouth daily. Honeysuckle extract     prochlorperazine (COMPAZINE) 10 MG tablet Take 1 tablet (10 mg total) by mouth every 6 (six) hours as needed for nausea or vomiting. 30 tablet 0   traZODone (DESYREL) 100 MG tablet Take 100 mg by mouth at bedtime as needed for sleep.     doxycycline (MONODOX) 100 MG capsule Take 100 mg by mouth 2 (two) times daily. (Patient not taking: Reported on 10/14/2022)     oxyCODONE (OXY IR/ROXICODONE) 5 MG immediate release tablet Take 1-2 tablets (5-10 mg total) by mouth every 6 (six) hours as needed for moderate  pain, severe pain or breakthrough pain. 30 tablet 0   polycarbophil (FIBERCON) 625 MG tablet Take 1 tablet (625 mg total) by mouth 2 (two) times daily. (Patient not taking: Reported on 10/14/2022)     No facility-administered medications prior to visit.    PAST MEDICAL HISTORY: Past Medical History:  Diagnosis Date   Anxiety    Arrhythmia    RBBB   Arthritis    Complication of anesthesia    Nausea   Dysrhythmia    [name], recurrent severe, without psychosis (HCC) 08/23/2021   Pneumonia    Rectal cancer (HCC) 11/20/2021   Sleep apnea    PAST SURGICAL HISTORY: Past Surgical History:  Procedure Laterality Date   BIOPSY  10/17/2022   Procedure: BIOPSY;  Surgeon: Andria Meuse, MD;  Location: [facility];  Service: General;;   DIVERTING ILEOSTOMY N/A 09/04/2022   Procedure: LOOP ILEOSTOMY;  Surgeon: Andria Meuse, MD;  Location: [facility];  Service: General;  Laterality: N/A;   FLEXIBLE SIGMOIDOSCOPY N/A 09/04/2022   Procedure: Arnell Sieving;  Surgeon: Andria Meuse, MD;  Location: [facility];  Service: General;  Laterality: N/A;   FLEXIBLE SIGMOIDOSCOPY N/A 10/17/2022   Procedure: FLEXIBLE SIGMOIDOSCOPY;  Surgeon: Andria Meuse, MD;  Location: [facility];  Service: General;  Laterality: N/A;   OPERATIVE ULTRASOUND Right 12/17/2021   Procedure: OPERATIVE ULTRASOUND;  Surgeon: Andria Meuse, MD;  Location: [facility];  Service: General;  Laterality: Right;   POLYPECTOMY  10/17/2022   Procedure: POLYPECTOMY;  Surgeon: Andria Meuse, MD;  Location: [facility];  Service: General;;   PORT-A-CATH REMOVAL N/A 05/20/2022   Procedure: REMOVAL PORT-A-CATH;  Surgeon: Andria Meuse, MD;  Location: [facility];  Service: General;  Laterality: N/A;   PORTACATH PLACEMENT Right 12/17/2021   Procedure: INSERTION PORT-A-CATH;  Surgeon: Andria Meuse, MD;  Location: [facility];  Service: General;  Laterality: Right;    RHINOPLASTY     SEPTOPLASTY     SHOULDER ARTHROSCOPY Left    SHOULDER ARTHROSCOPY WITH OPEN ROTATOR CUFF REPAIR AND DISTAL CLAVICLE ACROMINECTOMY Right 02/01/2021   Procedure: RIGHT SHOULDER ARTHROSCOPY WITH MINI OPEN ROTATOR CUFF REPAIR AND DISTAL CLAVICLE EXCISION;  Surgeon: Juanell Fairly, MD;  Location: [facility];  Service: Orthopedics;  Laterality: Right;   SPINE SURGERY     lumbar laminectomy   WRIST SURGERY Bilateral    corpal tunnel   XI ROBOTIC ASSISTED LOWER ANTERIOR RESECTION N/A 09/04/2022   Procedure: ROBOTIC LOW ANTERIOR RESECTION, BILATERAL TAP BLOCK, AND INOPERATIVE ASSESSMENT OF PERFUSION USING FIREFLY DYE;  Surgeon: Andria Meuse, MD;  Location: [facility];  Service: General;  Laterality: N/A;    FAMILY HISTORY: Family History  Adopted: Yes  Problem Relation Age of Onset   Colon cancer Neg Hx    Stomach cancer Neg Hx    Esophageal cancer Neg Hx     SOCIAL HISTORY: Social History   Socioeconomic History   Marital status: Single    Spouse name: Not on file   Number of children: 2   Years of education: Not on file   Highest education level: Not on file  Occupational History   Occupation: tech support  Tobacco Use   Smoking status: Every Day    Packs/day: 0.25    Years: 30.00    Additional pack years: 0.00    Total pack years: 7.50    Types: Cigarettes    Last attempt to quit: 11/21/2020    Years since quitting: 2.0    Passive exposure: Never   Smokeless tobacco: Former    Types: Chew    Quit date: 1990  Vaping Use   Vaping Use: Never used  Substance and Sexual Activity   Alcohol use: Not Currently    Comment: rare   Drug use: Yes    Frequency: 1.0 times per week    Types: Marijuana    Comment: Gummies   Sexual activity: Not Currently  Other Topics Concern   Not on file  Social History Narrative   Divorced, 2 daughters   Works in [employer] for [employer]   1 alcoholic drink a day smokes cigarettes, uses marijuana, 0-1 caffeinated beverages  daily   Social Determinants of Health   Financial Resource Strain: Medium Risk (01/15/2022)   Overall Financial Resource Strain (CARDIA)    Difficulty of Paying Living Expenses: Somewhat hard  Food Insecurity: Food Insecurity Present (09/04/2022)   Hunger Vital Sign    Worried About Running Out of Food in the Last Year: Sometimes true    Ran Out of Food in the Last Year: Sometimes true  Transportation Needs: Unmet Transportation Needs (09/04/2022)   PRAPARE - Administrator, Civil Service (Medical): Yes  Lack of Transportation (Non-Medical): Yes  Physical Activity: Not on file  Stress: Not on file  Social Connections: Not on file  Intimate Partner Violence: Not At Risk (09/04/2022)   Humiliation, Afraid, Rape, and Kick questionnaire    Fear of Current or Ex-Partner: No    Emotionally Abused: No    Physically Abused: No    Sexually Abused: No   Levert Feinstein, M.D. Ph.D.  [facility] [address] [place], [place] [address] Phone: [phone] Fax:      [phone]

## 2022-12-09 NOTE — Patient Instructions (Addendum)
SURGICAL WAITING ROOM VISITATION Patients having surgery or a procedure may have no more than 2 support people in the waiting area - these visitors may rotate.    Children under the age of 52 must have an adult with them who is not the patient.  If the patient needs to stay at the hospital during part of their recovery, the visitor guidelines for inpatient rooms apply. Pre-op nurse will coordinate an appropriate time for 1 support person to accompany patient in pre-op.  This support person may not rotate.    Please refer to the [facility] website for the visitor guidelines for Inpatients (after your surgery is over and you are in a regular room).       Your procedure is scheduled on: 12-18-22   Report to [facility] Main Entrance    Report to admitting at 12:45 PM   Call this number if you have problems the morning of surgery [phone]   Follow a clear liquid diet day of prep   Do not eat food :After Midnight.   After Midnight you may have the following liquids until 12:00 PM DAY OF SURGERY  Water Non-Citrus Juices (without pulp, NO RED-Apple, White grape, White cranberry) Black Coffee (NO MILK/CREAM OR CREAMERS, sugar ok)  Clear Tea (NO MILK/CREAM OR CREAMERS, sugar ok) regular and decaf                             Plain Jell-O (NO RED)                                           Fruit ices (not with fruit pulp, NO RED)                                     Popsicles (NO RED)                                                               Sports drinks like Gatorade (NO RED)   Drink 2 Pre-surgery Ensure the evening before surgery (complete by 10 PM)                   The day of surgery:  Drink ONE (1) Pre-Surgery Clear Ensure at 12:00 PM the morning of surgery. Drink in one sitting. Do not sip.  This drink was given to you during your hospital  pre-op appointment visit. Nothing else to drink after completing the Pre-Surgery Clear Ensure          If you have  questions, please contact your surgeon's office.   FOLLOW BOWEL PREP AND ANY ADDITIONAL PRE OP INSTRUCTIONS YOU RECEIVED FROM YOUR SURGEON'S OFFICE!!!     Oral Hygiene is also important to reduce your risk of infection.                                    Remember - BRUSH YOUR TEETH THE MORNING OF SURGERY WITH YOUR REGULAR TOOTHPASTE  Do NOT smoke after Midnight   Take these medicines the morning of surgery with A SIP OF WATER:   Gabapentin  Oxycodone and Compazine if needed   Bring CPAP mask and tubing day of surgery.                              You may not have any metal on your body including  jewelry, and body piercing             Do not wear  lotions, powders, cologne, or deodorant              Men may shave face and neck.   Do not bring valuables to the hospital. [facility] IS NOT RESPONSIBLE   FOR VALUABLES.   Contacts, dentures or bridgework may not be worn into surgery.   Bring small overnight bag day of surgery.   DO NOT BRING YOUR HOME MEDICATIONS TO THE HOSPITAL. PHARMACY WILL DISPENSE MEDICATIONS LISTED ON YOUR MEDICATION LIST TO YOU DURING YOUR ADMISSION IN THE HOSPITAL!   Special Instructions: Bring a copy of your healthcare power of attorney and living will documents the day of surgery if you haven't scanned them before.              Please read over the following fact sheets you were given: IF YOU HAVE QUESTIONS ABOUT YOUR PRE-OP INSTRUCTIONS PLEASE CALL [phone] [name]  If you received a COVID test during your pre-op visit  it is requested that you wear a mask when out in public, stay away from anyone that may not be feeling well and notify your surgeon if you develop symptoms. If you test positive for Covid or have been in contact with anyone that has tested positive in the last 10 days please notify you surgeon.  [facility] - Preparing for Surgery Before surgery, you can play an important role.  Because skin is not sterile, your skin needs to be as free  of germs as possible.  You can reduce the number of germs on your skin by washing with CHG (chlorahexidine gluconate) soap before surgery.  CHG is an antiseptic cleaner which kills germs and bonds with the skin to continue killing germs even after washing. Please DO NOT use if you have an allergy to CHG or antibacterial soaps.  If your skin becomes reddened/irritated stop using the CHG and inform your nurse when you arrive at Short Stay. Do not shave (including legs and underarms) for at least 48 hours prior to the first CHG shower.  You may shave your face/neck.  Please follow these instructions carefully:  1.  Shower with CHG Soap the night before surgery and the  morning of surgery.  2.  If you choose to wash your hair, wash your hair first as usual with your normal  shampoo.  3.  After you shampoo, rinse your hair and body thoroughly to remove the shampoo.                             4.  Use CHG as you would any other liquid soap.  You can apply chg directly to the skin and wash.  Gently with a scrungie or clean washcloth.  5.  Apply the CHG Soap to your body ONLY FROM THE NECK DOWN.   Do   not use on face/ open  Wound or open sores. Avoid contact with eyes, ears mouth and   genitals (private parts).                       Wash face,  Genitals (private parts) with your normal soap.             6.  Wash thoroughly, paying special attention to the area where your    surgery  will be performed.  7.  Thoroughly rinse your body with warm water from the neck down.  8.  DO NOT shower/wash with your normal soap after using and rinsing off the CHG Soap.                9.  Pat yourself dry with a clean towel.            10.  Wear clean pajamas.            11.  Place clean sheets on your bed the night of your first shower and do not  sleep with pets. Day of Surgery : Do not apply any lotions/deodorants the morning of surgery.  Please wear clean clothes to the hospital/surgery  center.  FAILURE TO FOLLOW THESE INSTRUCTIONS MAY RESULT IN THE CANCELLATION OF YOUR SURGERY  PATIENT SIGNATURE_________________________________  NURSE SIGNATURE__________________________________  ________________________________________________________________________    Adam Phenix  An incentive spirometer is a tool that can help keep your lungs clear and active. This tool measures how well you are filling your lungs with each breath. Taking long deep breaths may help reverse or decrease the chance of developing breathing (pulmonary) problems (especially infection) following: A long period of time when you are unable to move or be active. BEFORE THE PROCEDURE  If the spirometer includes an indicator to show your best effort, your nurse or respiratory therapist will set it to a desired goal. If possible, sit up straight or lean slightly forward. Try not to slouch. Hold the incentive spirometer in an upright position. INSTRUCTIONS FOR USE  Sit on the edge of your bed if possible, or sit up as far as you can in bed or on a chair. Hold the incentive spirometer in an upright position. Breathe out normally. Place the mouthpiece in your mouth and seal your lips tightly around it. Breathe in slowly and as deeply as possible, raising the piston or the ball toward the top of the column. Hold your breath for 3-5 seconds or for as long as possible. Allow the piston or ball to fall to the bottom of the column. Remove the mouthpiece from your mouth and breathe out normally. Rest for a few seconds and repeat Steps 1 through 7 at least 10 times every 1-2 hours when you are awake. Take your time and take a few normal breaths between deep breaths. The spirometer may include an indicator to show your best effort. Use the indicator as a goal to work toward during each repetition. After each set of 10 deep breaths, practice coughing to be sure your lungs are clear. If you have an incision (the cut  made at the time of surgery), support your incision when coughing by placing a pillow or rolled up towels firmly against it. Once you are able to get out of bed, walk around indoors and cough well. You may stop using the incentive spirometer when instructed by your caregiver.  RISKS AND COMPLICATIONS Take your time so you do not get dizzy or light-headed. If you are in pain,  you may need to take or ask for pain medication before doing incentive spirometry. It is harder to take a deep breath if you are having pain. AFTER USE Rest and breathe slowly and easily. It can be helpful to keep track of a log of your progress. Your caregiver can provide you with a simple table to help with this. If you are using the spirometer at home, follow these instructions: [place] IF:  You are having difficultly using the spirometer. You have trouble using the spirometer as often as instructed. Your pain medication is not giving enough relief while using the spirometer. You develop fever of 100.5 F (38.1 C) or higher. SEEK IMMEDIATE MEDICAL CARE IF:  You cough up bloody sputum that had not been present before. You develop fever of 102 F (38.9 C) or greater. You develop worsening pain at or near the incision site. MAKE SURE YOU:  Understand these instructions. Will watch your condition. Will get help right away if you are not doing well or get worse. Document Released: 10/14/2006 Document Revised: 08/26/2011 Document Reviewed: 12/15/2006 [facility] Patient Information 2014 [place], [place].   ________________________________________________________________________

## 2022-12-10 LAB — THYROID PANEL WITH TSH
Free Thyroxine Index: 1.9 (ref 1.2–4.9)
T3 Uptake Ratio: 28 % (ref 24–39)
T4, Total: 6.8 ug/dL (ref 4.5–12.0)
TSH: 1.22 u[IU]/mL (ref 0.450–4.500)

## 2022-12-10 LAB — VITAMIN D 25 HYDROXY (VIT D DEFICIENCY, FRACTURES): Vit D, 25-Hydroxy: 18.1 ng/mL — ABNORMAL LOW (ref 30.0–100.0)

## 2022-12-10 LAB — FOLATE: Folate: 8.4 ng/mL (ref >3.0)

## 2022-12-10 LAB — VITAMIN B12: Vitamin B-12: 367 pg/mL (ref 232–1245)

## 2022-12-12 ENCOUNTER — Other Ambulatory Visit: Payer: Self-pay

## 2022-12-12 ENCOUNTER — Encounter (HOSPITAL_COMMUNITY): Payer: Self-pay

## 2022-12-12 ENCOUNTER — Encounter (HOSPITAL_COMMUNITY)
Admission: RE | Admit: 2022-12-12 | Discharge: 2022-12-12 | Disposition: A | Discharge status: Home / Self Care | Payer: 59 | Source: Ambulatory Visit | Attending: Surgery | Admitting: Surgery

## 2022-12-12 VITALS — BP 135/80 | HR 74 | Temp 98.3°F | Resp 16 | Ht 73.0 in | Wt 250.4 lb

## 2022-12-12 DIAGNOSIS — I517 Cardiomegaly: Secondary | ICD-10-CM | POA: Insufficient documentation

## 2022-12-12 DIAGNOSIS — I4510 Unspecified right bundle-branch block: Secondary | ICD-10-CM | POA: Insufficient documentation

## 2022-12-12 DIAGNOSIS — F419 Anxiety disorder, unspecified: Secondary | ICD-10-CM | POA: Insufficient documentation

## 2022-12-12 DIAGNOSIS — I700 Atherosclerosis of aorta: Secondary | ICD-10-CM | POA: Diagnosis not present

## 2022-12-12 DIAGNOSIS — J449 Chronic obstructive pulmonary disease, unspecified: Secondary | ICD-10-CM | POA: Insufficient documentation

## 2022-12-12 DIAGNOSIS — Z01818 Encounter for other preprocedural examination: Secondary | ICD-10-CM | POA: Diagnosis not present

## 2022-12-12 DIAGNOSIS — Z932 Ileostomy status: Secondary | ICD-10-CM | POA: Diagnosis not present

## 2022-12-12 DIAGNOSIS — Z85048 Personal history of other malignant neoplasm of rectum, rectosigmoid junction, and anus: Secondary | ICD-10-CM | POA: Diagnosis not present

## 2022-12-12 DIAGNOSIS — I440 Atrioventricular block, first degree: Secondary | ICD-10-CM | POA: Insufficient documentation

## 2022-12-12 DIAGNOSIS — I2510 Atherosclerotic heart disease of native coronary artery without angina pectoris: Secondary | ICD-10-CM

## 2022-12-12 HISTORY — DX: Dyspnea, unspecified: R06.00

## 2022-12-12 HISTORY — DX: Chronic obstructive pulmonary disease, unspecified: J44.9

## 2022-12-12 LAB — CBC
HCT: 44.7 % (ref 39.0–52.0)
Hemoglobin: 14.8 g/dL (ref 13.0–17.0)
MCH: 30.1 pg (ref 26.0–34.0)
MCHC: 33.1 g/dL (ref 30.0–36.0)
MCV: 91 fL (ref 80.0–100.0)
Platelets: 219 10*3/uL (ref 150–400)
RBC: 4.91 MIL/uL (ref 4.22–5.81)
RDW: 14.4 % (ref 11.5–15.5)
WBC: 4.6 10*3/uL (ref 4.0–10.5)
nRBC: 0 % (ref 0.0–0.2)

## 2022-12-12 LAB — BASIC METABOLIC PANEL
Anion gap: 12 (ref 5–15)
BUN: 11 mg/dL (ref 6–20)
CO2: 20 mmol/L — ABNORMAL LOW (ref 22–32)
Calcium: 9.3 mg/dL (ref 8.9–10.3)
Chloride: 107 mmol/L (ref 98–111)
Creatinine, Ser: 0.6 mg/dL — ABNORMAL LOW (ref 0.61–1.24)
GFR, Estimated: >60 mL/min (ref >60)
Glucose, Bld: 98 mg/dL (ref 70–99)
Potassium: 3.9 mmol/L (ref 3.5–5.1)
Sodium: 139 mmol/L (ref 135–145)

## 2022-12-13 ENCOUNTER — Encounter (HOSPITAL_COMMUNITY): Payer: Self-pay

## 2022-12-13 NOTE — Progress Notes (Signed)
Case: [id number] Date/Time: 12/18/22 1445   Procedure: LOOP ILEOSTOMY REVERSAL   Anesthesia type: General   Pre-op diagnosis: ILEOSTOMY STATUS   Location: [facility]OM 02 / [facility]   Surgeons: Andria Meuse, MD       DISCUSSION: Billy Roman is a 62 year old male who presents to PAT prior to surgery listed above.  Past medical history significant for everyday smoking, marijuana use, alcohol use, rectal cancer s/p LAR with diverting loop ileostomy creation (09/04/22), COPD, history of incomplete right bundle branch block, anxiety and depression, memory loss, tremor. Now scheduled for ileostomy reversal.  Prior complications from anesthesia include PONV.  Patient was hospitalized on the psychiatry service with depression in March 2023.  Screening EKG showed sinus rhythm with incomplete right bundle branch block and first-degree AV block so he was referred to Cardiology who he saw on 09/19/21. Other than fatigue patient has been asymptomatic from this.  Patient was given reassurance by Dr. Tresa Endo and an echo was obtained which showed normal EF with mild LVH and minimal aortic root dilatation.  No further treatment or follow-up recommended.  Patient follows with neurology for tremor and memory loss.  Last seen on 12/09/2022.  Has previously had a MRI brain which was normal.  Referred to neuropsych.  He follows with Oncology due to hx of rectal cancer. Has completed neoadjuvant therapy and is under surveillance at this time.  VS: BP 135/80   Pulse 74   Temp 36.8 C (Oral)   Resp 16   Ht 6\' 1"  (1.854 m)   Wt 113.6 kg   SpO2 97%   BMI 33.04 kg/m   PROVIDERS: Rema Fendt, NP Cardiology: Nicki Guadalajara, MD Neurology: Levert Feinstein, MD Oncologist- [name], MD   LABS: Labs reviewed: Acceptable for surgery. (all labs ordered are listed, but only abnormal results are displayed)  Labs Reviewed  BASIC METABOLIC PANEL - Abnormal; Notable for the following components:      Result Value    CO2 20 (*)    Creatinine, Ser 0.60 (*)    All other components within normal limits  CBC     IMAGES:  CT Chest/abdomen/pelvis 08/28/22:  IMPRESSION: CHEST IMPRESSION:   No evidence of thoracic metastasis.   PELVIS IMPRESSION:   1. Interval decrease in size of mesorectal sheath lymph nodes. 2. Persistent submucosal thickening of the distal sigmoid colon and rectum. 3. Haziness in the presacral fat related to radiation treatment 4. No evidence of metastatic disease in the abdomen pelvis. 5. No skeletal metastasis. 6.  Aortic Atherosclerosis (ICD10-I70.0).     EKG 12/12/22:  Sinus rhythm with 1st degree AV block, rate 75 Incomplete RBBB Artifact Appears similar to prior   CV:  Echo 10/01/21:  IMPRESSIONS     1. Left ventricular ejection fraction, by estimation, is 60 to 65%. The  left ventricle has normal function. The left ventricle has no regional  wall motion abnormalities. There is mild left ventricular hypertrophy.  Left ventricular diastolic parameters  were normal.   2. Right ventricular systolic function is normal. The right ventricular  size is mildly enlarged. There is normal pulmonary artery systolic  pressure. The estimated right ventricular systolic pressure is 26.2 mmHg.   3. The mitral valve is normal in structure. No evidence of mitral valve  regurgitation.   4. The aortic valve was not well visualized. Aortic valve regurgitation  is not visualized. No aortic stenosis is present.   5. Aortic dilatation noted. There is mild dilatation of the  ascending  aorta, measuring 38 mm.   6. The inferior vena cava is normal in size with greater than 50%  respiratory variability, suggesting right atrial pressure of 3 mmHg.   Past Medical History:  Diagnosis Date   Anxiety    Arthritis    Complication of anesthesia    Nausea   COPD (chronic obstructive pulmonary disease) (HCC)    Dyspnea    Dysrhythmia    RBBB   [name],  recurrent severe, without psychosis (HCC) 08/23/2021   Pneumonia    Rectal cancer (HCC) 11/20/2021   Sleep apnea     Past Surgical History:  Procedure Laterality Date   BIOPSY  10/17/2022   Procedure: BIOPSY;  Surgeon: Andria Meuse, MD;  Location: [facility];  Service: General;;   DIVERTING ILEOSTOMY N/A 09/04/2022   Procedure: LOOP ILEOSTOMY;  Surgeon: Andria Meuse, MD;  [facility];  Service: General;  Laterality: N/A;   FLEXIBLE SIGMOIDOSCOPY N/A 09/04/2022   Procedure: FLEXIBLE SIGMOIDOSCOPY;  Surgeon: Andria Meuse, MD;  [facility];  Service: General;  Laterality: N/A;   FLEXIBLE SIGMOIDOSCOPY N/A 10/17/2022   Procedure: FLEXIBLE SIGMOIDOSCOPY;  Surgeon: Andria Meuse, MD;  [facility];  Service: General;  Laterality: N/A;   OPERATIVE ULTRASOUND Right 12/17/2021   Procedure: OPERATIVE ULTRASOUND;  Surgeon: Andria Meuse, MD;  Location: [facility];  Service: General;  Laterality: Right;   POLYPECTOMY  10/17/2022   Procedure: POLYPECTOMY;  Surgeon: Andria Meuse, MD;  [facility];  Service: General;;   PORT-A-CATH REMOVAL N/A 05/20/2022   Procedure: REMOVAL PORT-A-CATH;  Surgeon: Andria Meuse, MD;  Location: [facility];  Service: General;  Laterality: N/A;   PORTACATH PLACEMENT Right 12/17/2021   Procedure: INSERTION PORT-A-CATH;  Surgeon: Andria Meuse, MD;  Location: [facility];  Service: General;  Laterality: Right;   RHINOPLASTY     SEPTOPLASTY     SHOULDER ARTHROSCOPY Left    SHOULDER ARTHROSCOPY WITH OPEN ROTATOR CUFF REPAIR AND DISTAL CLAVICLE ACROMINECTOMY Right 02/01/2021   Procedure: RIGHT SHOULDER ARTHROSCOPY WITH MINI OPEN ROTATOR CUFF REPAIR AND DISTAL CLAVICLE EXCISION;  Surgeon: Juanell Fairly, MD;  Location: [facility];  Service: Orthopedics;  Laterality: Right;   SPINE SURGERY     lumbar laminectomy   WRIST SURGERY Bilateral    corpal tunnel   XI ROBOTIC ASSISTED LOWER ANTERIOR  RESECTION N/A 09/04/2022   Procedure: ROBOTIC LOW ANTERIOR RESECTION, BILATERAL TAP BLOCK, AND INOPERATIVE ASSESSMENT OF PERFUSION USING FIREFLY DYE;  Surgeon: Andria Meuse, MD;  [facility];  Service: General;  Laterality: N/A;    MEDICATIONS:  gabapentin (NEURONTIN) 300 MG capsule   loperamide (IMODIUM) 2 MG capsule   methocarbamol (ROBAXIN) 500 MG tablet   OVER THE COUNTER MEDICATION   prochlorperazine (COMPAZINE) 10 MG tablet   silver sulfADIAZINE (SILVADENE) 1 % cream   traZODone (DESYREL) 100 MG tablet   No current facility-administered medications for this encounter.   Marcille Blanco [facility] Short Stay/Anesthesiology [facility] Phone [phone] 12/13/2022 10:55 AM

## 2022-12-13 NOTE — Anesthesia Preprocedure Evaluation (Signed)
Anesthesia Evaluation  Patient identified by MRN, date of birth, ID band Patient awake    Reviewed: Allergy & Precautions, H&P , NPO status , Patient's Chart, lab work & pertinent test results  Airway Mallampati: III  TM Distance: >3 FB Neck ROM: Full    Dental no notable dental hx. (+) Teeth Intact, Dental Advisory Given   Pulmonary sleep apnea , COPD, Current Smoker and Patient abstained from smoking.   Pulmonary exam normal breath sounds clear to auscultation       Cardiovascular negative cardio ROS  Rhythm:Regular Rate:Normal     Neuro/Psych   Anxiety Depression    negative neurological ROS     GI/Hepatic negative GI ROS, Neg liver ROS,,,  Endo/Other  negative endocrine ROS    Renal/GU negative Renal ROS  negative genitourinary   Musculoskeletal  (+) Arthritis , Osteoarthritis,    Abdominal   Peds  Hematology negative hematology ROS (+)   Anesthesia Other Findings   Reproductive/Obstetrics negative OB ROS                             Anesthesia Physical Anesthesia Plan  ASA: 3  Anesthesia Plan: General   Post-op Pain Management: Tylenol PO (pre-op)*   Induction: Intravenous  PONV Risk Score and Plan: 2 and Ondansetron, Dexamethasone and Midazolam  Airway Management Planned: Oral ETT  Additional Equipment:   Intra-op Plan:   Post-operative Plan: Extubation in OR  Informed Consent: I have reviewed the patients History and Physical, chart, labs and discussed the procedure including the risks, benefits and alternatives for the proposed anesthesia with the patient or authorized representative who has indicated his/her understanding and acceptance.     Dental advisory given  Plan Discussed with: CRNA  Anesthesia Plan Comments: (See PAT note from 6/27 by Sherlie Ban PA-C )        Anesthesia Quick Evaluation

## 2022-12-18 ENCOUNTER — Other Ambulatory Visit: Payer: Self-pay

## 2022-12-18 ENCOUNTER — Inpatient Hospital Stay (HOSPITAL_COMMUNITY): Payer: 59 | Admitting: Medical

## 2022-12-18 ENCOUNTER — Encounter (HOSPITAL_COMMUNITY): Payer: Self-pay | Admitting: Surgery

## 2022-12-18 ENCOUNTER — Encounter (HOSPITAL_COMMUNITY)
Admission: RE | Disposition: A | Discharge status: Home / Self Care | Payer: Self-pay | Source: Home / Self Care | Attending: Surgery

## 2022-12-18 ENCOUNTER — Inpatient Hospital Stay (HOSPITAL_COMMUNITY)
Admission: RE | Admit: 2022-12-18 | Discharge: 2022-12-22 | DRG: 331 | Disposition: A | Discharge status: Home / Self Care | Payer: 59 | Attending: General Surgery | Admitting: General Surgery

## 2022-12-18 ENCOUNTER — Inpatient Hospital Stay (HOSPITAL_COMMUNITY): Payer: 59 | Admitting: Anesthesiology

## 2022-12-18 DIAGNOSIS — Z432 Encounter for attention to ileostomy: Secondary | ICD-10-CM | POA: Diagnosis not present

## 2022-12-18 DIAGNOSIS — R251 Tremor, unspecified: Secondary | ICD-10-CM | POA: Diagnosis present

## 2022-12-18 DIAGNOSIS — K9411 Enterostomy hemorrhage: Secondary | ICD-10-CM | POA: Diagnosis not present

## 2022-12-18 DIAGNOSIS — K5730 Diverticulosis of large intestine without perforation or abscess without bleeding: Secondary | ICD-10-CM | POA: Diagnosis present

## 2022-12-18 DIAGNOSIS — R918 Other nonspecific abnormal finding of lung field: Secondary | ICD-10-CM | POA: Diagnosis present

## 2022-12-18 DIAGNOSIS — Z86010 Personal history of colonic polyps: Secondary | ICD-10-CM | POA: Diagnosis not present

## 2022-12-18 DIAGNOSIS — I2510 Atherosclerotic heart disease of native coronary artery without angina pectoris: Secondary | ICD-10-CM | POA: Diagnosis present

## 2022-12-18 DIAGNOSIS — G4730 Sleep apnea, unspecified: Secondary | ICD-10-CM

## 2022-12-18 DIAGNOSIS — J449 Chronic obstructive pulmonary disease, unspecified: Secondary | ICD-10-CM | POA: Diagnosis present

## 2022-12-18 DIAGNOSIS — Z888 Allergy status to other drugs, medicaments and biological substances status: Secondary | ICD-10-CM

## 2022-12-18 DIAGNOSIS — Z91041 Radiographic dye allergy status: Secondary | ICD-10-CM | POA: Diagnosis not present

## 2022-12-18 DIAGNOSIS — F17210 Nicotine dependence, cigarettes, uncomplicated: Secondary | ICD-10-CM | POA: Diagnosis present

## 2022-12-18 DIAGNOSIS — Z87892 Personal history of anaphylaxis: Secondary | ICD-10-CM

## 2022-12-18 DIAGNOSIS — Z85048 Personal history of other malignant neoplasm of rectum, rectosigmoid junction, and anus: Secondary | ICD-10-CM | POA: Diagnosis not present

## 2022-12-18 DIAGNOSIS — R197 Diarrhea, unspecified: Secondary | ICD-10-CM | POA: Diagnosis not present

## 2022-12-18 DIAGNOSIS — Z9889 Other specified postprocedural states: Principal | ICD-10-CM | POA: Diagnosis present

## 2022-12-18 DIAGNOSIS — Z932 Ileostomy status: Secondary | ICD-10-CM

## 2022-12-18 HISTORY — PX: ILEOSTOMY CLOSURE: SHX1784

## 2022-12-18 SURGERY — CLOSURE, ILEOSTOMY
Anesthesia: General

## 2022-12-18 MED ORDER — TRAZODONE HCL 100 MG PO TABS: 100.0000 mg | ORAL_TABLET | Freq: Every evening | ORAL | Status: DC | PRN

## 2022-12-18 MED ORDER — PROPOFOL 10 MG/ML IV BOLUS
INTRAVENOUS | Status: DC | PRN
  Administered 2022-12-18: 200 mg via INTRAVENOUS

## 2022-12-18 MED ORDER — FENTANYL CITRATE (PF) 100 MCG/2ML IJ SOLN
INTRAMUSCULAR | Status: DC | PRN
  Administered 2022-12-18 (×3): 50 ug via INTRAVENOUS

## 2022-12-18 MED ORDER — LIDOCAINE HCL (PF) 2 % IJ SOLN
INTRAMUSCULAR | Status: AC
  Filled 2022-12-18: qty 5

## 2022-12-18 MED ORDER — DEXAMETHASONE SODIUM PHOSPHATE 10 MG/ML IJ SOLN
INTRAMUSCULAR | Status: DC | PRN
  Administered 2022-12-18: 10 mg via INTRAVENOUS

## 2022-12-18 MED ORDER — BUPIVACAINE LIPOSOME 1.3 % IJ SUSP
INTRAMUSCULAR | Status: AC
  Filled 2022-12-18: qty 20

## 2022-12-18 MED ORDER — METHOCARBAMOL 500 MG PO TABS
500.0000 mg | ORAL_TABLET | Freq: Three times a day (TID) | ORAL | Status: DC | PRN
  Administered 2022-12-19 – 2022-12-21 (×6): 500 mg via ORAL
  Filled 2022-12-18 (×6): qty 1

## 2022-12-18 MED ORDER — ROCURONIUM BROMIDE 10 MG/ML (PF) SYRINGE
PREFILLED_SYRINGE | INTRAVENOUS | Status: AC
  Filled 2022-12-18: qty 10

## 2022-12-18 MED ORDER — FENTANYL CITRATE (PF) 100 MCG/2ML IJ SOLN
INTRAMUSCULAR | Status: AC
  Filled 2022-12-18: qty 2

## 2022-12-18 MED ORDER — CHLORHEXIDINE GLUCONATE 0.12 % MT SOLN: 15.0000 mL | Freq: Once | OROMUCOSAL | Status: DC

## 2022-12-18 MED ORDER — SODIUM CHLORIDE 0.9 % IV SOLN
2.0000 g | INTRAVENOUS | Status: AC
  Administered 2022-12-18: 2 g via INTRAVENOUS
  Filled 2022-12-18: qty 2

## 2022-12-18 MED ORDER — AMISULPRIDE (ANTIEMETIC) 5 MG/2ML IV SOLN
INTRAVENOUS | Status: AC
  Administered 2022-12-18: 10 mg via INTRAVENOUS
  Filled 2022-12-18: qty 4

## 2022-12-18 MED ORDER — SODIUM CHLORIDE (PF) 0.9 % IJ SOLN
INTRAMUSCULAR | Status: DC | PRN
  Administered 2022-12-18: 50 mL

## 2022-12-18 MED ORDER — IBUPROFEN 400 MG PO TABS: 600.0000 mg | ORAL_TABLET | Freq: Four times a day (QID) | ORAL | Status: DC | PRN

## 2022-12-18 MED ORDER — BUPIVACAINE-EPINEPHRINE (PF) 0.5% -1:200000 IJ SOLN
INTRAMUSCULAR | Status: AC
  Filled 2022-12-18: qty 30

## 2022-12-18 MED ORDER — SODIUM CHLORIDE (PF) 0.9 % IJ SOLN
INTRAMUSCULAR | Status: AC
  Filled 2022-12-18: qty 20

## 2022-12-18 MED ORDER — HYDROMORPHONE HCL 1 MG/ML IJ SOLN
INTRAMUSCULAR | Status: AC
  Administered 2022-12-18: 0.5 mg via INTRAVENOUS
  Filled 2022-12-18: qty 1

## 2022-12-18 MED ORDER — LACTATED RINGERS IV SOLN: INTRAVENOUS | Status: DC

## 2022-12-18 MED ORDER — HEPARIN SODIUM (PORCINE) 5000 UNIT/ML IJ SOLN
5000.0000 [IU] | Freq: Once | INTRAMUSCULAR | Status: AC
  Administered 2022-12-18: 5000 [IU] via SUBCUTANEOUS
  Filled 2022-12-18: qty 1

## 2022-12-18 MED ORDER — ENSURE PRE-SURGERY PO LIQD: 296.0000 mL | Freq: Once | ORAL | Status: DC

## 2022-12-18 MED ORDER — DEXAMETHASONE SODIUM PHOSPHATE 10 MG/ML IJ SOLN
INTRAMUSCULAR | Status: AC
  Filled 2022-12-18: qty 1

## 2022-12-18 MED ORDER — AMISULPRIDE (ANTIEMETIC) 5 MG/2ML IV SOLN: 10.0000 mg | Freq: Once | INTRAVENOUS | Status: AC

## 2022-12-18 MED ORDER — SUGAMMADEX SODIUM 200 MG/2ML IV SOLN
INTRAVENOUS | Status: DC | PRN
  Administered 2022-12-18: 200 mg via INTRAVENOUS

## 2022-12-18 MED ORDER — ONDANSETRON HCL 4 MG/2ML IJ SOLN
INTRAMUSCULAR | Status: DC | PRN
  Administered 2022-12-18: 4 mg via INTRAVENOUS

## 2022-12-18 MED ORDER — ACETAMINOPHEN 500 MG PO TABS
1000.0000 mg | ORAL_TABLET | ORAL | Status: AC
  Administered 2022-12-18: 1000 mg via ORAL
  Filled 2022-12-18: qty 2

## 2022-12-18 MED ORDER — 0.9 % SODIUM CHLORIDE (POUR BTL) OPTIME
TOPICAL | Status: DC | PRN
  Administered 2022-12-18: 1000 mL

## 2022-12-18 MED ORDER — DIPHENHYDRAMINE HCL 12.5 MG/5ML PO ELIX: 12.5000 mg | ORAL_SOLUTION | Freq: Four times a day (QID) | ORAL | Status: DC | PRN

## 2022-12-18 MED ORDER — BUPIVACAINE LIPOSOME 1.3 % IJ SUSP: 20.0000 mL | Freq: Once | INTRAMUSCULAR | Status: DC

## 2022-12-18 MED ORDER — TRAMADOL HCL 50 MG PO TABS: 50.0000 mg | ORAL_TABLET | Freq: Four times a day (QID) | ORAL | 0 refills | Status: DC | PRN

## 2022-12-18 MED ORDER — ALUM & MAG HYDROXIDE-SIMETH 200-200-20 MG/5ML PO SUSP
30.0000 mL | Freq: Four times a day (QID) | ORAL | Status: DC | PRN
  Administered 2022-12-20: 30 mL via ORAL
  Filled 2022-12-18: qty 30

## 2022-12-18 MED ORDER — ENSURE PRE-SURGERY PO LIQD
592.0000 mL | Freq: Once | ORAL | Status: DC
  Filled 2022-12-18: qty 592

## 2022-12-18 MED ORDER — ALVIMOPAN 12 MG PO CAPS
12.0000 mg | ORAL_CAPSULE | ORAL | Status: AC
  Administered 2022-12-18: 12 mg via ORAL
  Filled 2022-12-18: qty 1

## 2022-12-18 MED ORDER — MIDAZOLAM HCL 2 MG/2ML IJ SOLN
INTRAMUSCULAR | Status: AC
  Filled 2022-12-18: qty 2

## 2022-12-18 MED ORDER — DIPHENHYDRAMINE HCL 50 MG/ML IJ SOLN: 12.5000 mg | Freq: Four times a day (QID) | INTRAMUSCULAR | Status: DC | PRN

## 2022-12-18 MED ORDER — GABAPENTIN 100 MG PO CAPS
300.0000 mg | ORAL_CAPSULE | Freq: Two times a day (BID) | ORAL | Status: DC | PRN
  Administered 2022-12-20: 300 mg via ORAL
  Filled 2022-12-18: qty 3

## 2022-12-18 MED ORDER — ONDANSETRON HCL 4 MG PO TABS
4.0000 mg | ORAL_TABLET | Freq: Four times a day (QID) | ORAL | Status: DC | PRN
  Administered 2022-12-22: 4 mg via ORAL
  Filled 2022-12-18: qty 1

## 2022-12-18 MED ORDER — MIDAZOLAM HCL 5 MG/5ML IJ SOLN
INTRAMUSCULAR | Status: DC | PRN
  Administered 2022-12-18: 2 mg via INTRAVENOUS

## 2022-12-18 MED ORDER — ENSURE SURGERY PO LIQD
237.0000 mL | Freq: Two times a day (BID) | ORAL | Status: DC
  Administered 2022-12-19 (×2): 237 mL via ORAL

## 2022-12-18 MED ORDER — HYDRALAZINE HCL 20 MG/ML IJ SOLN: 10.0000 mg | INTRAMUSCULAR | Status: DC | PRN

## 2022-12-18 MED ORDER — HEPARIN SODIUM (PORCINE) 5000 UNIT/ML IJ SOLN
5000.0000 [IU] | Freq: Three times a day (TID) | INTRAMUSCULAR | Status: DC
  Administered 2022-12-18 – 2022-12-22 (×11): 5000 [IU] via SUBCUTANEOUS
  Filled 2022-12-18 (×11): qty 1

## 2022-12-18 MED ORDER — PROPOFOL 10 MG/ML IV BOLUS
INTRAVENOUS | Status: AC
  Filled 2022-12-18: qty 20

## 2022-12-18 MED ORDER — ONDANSETRON HCL 4 MG/2ML IJ SOLN
4.0000 mg | Freq: Four times a day (QID) | INTRAMUSCULAR | Status: DC | PRN
  Administered 2022-12-18 – 2022-12-21 (×9): 4 mg via INTRAVENOUS
  Filled 2022-12-18 (×9): qty 2

## 2022-12-18 MED ORDER — ALVIMOPAN 12 MG PO CAPS: 12.0000 mg | ORAL_CAPSULE | Freq: Two times a day (BID) | ORAL | Status: DC

## 2022-12-18 MED ORDER — LIDOCAINE 2% (20 MG/ML) 5 ML SYRINGE
INTRAMUSCULAR | Status: DC | PRN
  Administered 2022-12-18: 60 mg via INTRAVENOUS

## 2022-12-18 MED ORDER — ONDANSETRON HCL 4 MG/2ML IJ SOLN
INTRAMUSCULAR | Status: AC
  Filled 2022-12-18: qty 2

## 2022-12-18 MED ORDER — ORAL CARE MOUTH RINSE: 15.0000 mL | Freq: Once | OROMUCOSAL | Status: DC

## 2022-12-18 MED ORDER — TRAMADOL HCL 50 MG PO TABS
50.0000 mg | ORAL_TABLET | Freq: Four times a day (QID) | ORAL | Status: DC | PRN
  Administered 2022-12-19 (×2): 50 mg via ORAL
  Filled 2022-12-18 (×2): qty 1

## 2022-12-18 MED ORDER — ONDANSETRON HCL 4 MG/2ML IJ SOLN
4.0000 mg | Freq: Once | INTRAMUSCULAR | Status: AC
  Administered 2022-12-18: 4 mg via INTRAVENOUS

## 2022-12-18 MED ORDER — HYDROMORPHONE HCL 1 MG/ML IJ SOLN
0.5000 mg | INTRAMUSCULAR | Status: DC | PRN
  Administered 2022-12-18 – 2022-12-19 (×2): 0.5 mg via INTRAVENOUS
  Filled 2022-12-18 (×2): qty 0.5

## 2022-12-18 MED ORDER — ACETAMINOPHEN 500 MG PO TABS
1000.0000 mg | ORAL_TABLET | Freq: Four times a day (QID) | ORAL | Status: DC
  Administered 2022-12-18 – 2022-12-22 (×14): 1000 mg via ORAL
  Filled 2022-12-18 (×15): qty 2

## 2022-12-18 MED ORDER — ROCURONIUM BROMIDE 10 MG/ML (PF) SYRINGE
PREFILLED_SYRINGE | INTRAVENOUS | Status: DC | PRN
  Administered 2022-12-18 (×2): 10 mg via INTRAVENOUS
  Administered 2022-12-18: 40 mg via INTRAVENOUS

## 2022-12-18 MED ORDER — HYDROMORPHONE HCL 1 MG/ML IJ SOLN
0.2500 mg | INTRAMUSCULAR | Status: DC | PRN
  Administered 2022-12-18: 0.25 mg via INTRAVENOUS

## 2022-12-18 MED ORDER — SIMETHICONE 80 MG PO CHEW: 40.0000 mg | CHEWABLE_TABLET | Freq: Four times a day (QID) | ORAL | Status: DC | PRN

## 2022-12-18 MED ORDER — SUCCINYLCHOLINE CHLORIDE 200 MG/10ML IV SOSY
PREFILLED_SYRINGE | INTRAVENOUS | Status: DC | PRN
  Administered 2022-12-18: 150 mg via INTRAVENOUS

## 2022-12-18 SURGICAL SUPPLY — 52 items
APL PRP STRL LF DISP 70% ISPRP (MISCELLANEOUS) ×1
BAG COUNTER SPONGE SURGICOUNT (BAG) IMPLANT
BAG SPNG CNTER NS LX DISP (BAG)
BLADE HEX COATED 2.75 (ELECTRODE) ×2 IMPLANT
BNDG GAUZE DERMACEA FLUFF 4 (GAUZE/BANDAGES/DRESSINGS) IMPLANT
BNDG GZE DERMACEA 4 6PLY (GAUZE/BANDAGES/DRESSINGS) ×1
CHLORAPREP W/TINT 26 (MISCELLANEOUS) ×2 IMPLANT
COVER MAYO STAND STRL (DRAPES) ×2 IMPLANT
DRAPE LAPAROSCOPIC ABDOMINAL (DRAPES) ×2 IMPLANT
DRAPE UTILITY XL STRL (DRAPES) IMPLANT
DRAPE WARM FLUID 44X44 (DRAPES) ×2 IMPLANT
DRSG OPSITE POSTOP 4X10 (GAUZE/BANDAGES/DRESSINGS) IMPLANT
DRSG OPSITE POSTOP 4X6 (GAUZE/BANDAGES/DRESSINGS) IMPLANT
DRSG OPSITE POSTOP 4X8 (GAUZE/BANDAGES/DRESSINGS) IMPLANT
DRSG TELFA 3X8 NADH STRL (GAUZE/BANDAGES/DRESSINGS) IMPLANT
ELECT REM PT RETURN 15FT ADLT (MISCELLANEOUS) ×2 IMPLANT
GAUZE SPONGE 4X4 12PLY STRL (GAUZE/BANDAGES/DRESSINGS) ×2 IMPLANT
GLOVE BIOGEL PI IND STRL 7.0 (GLOVE) ×2 IMPLANT
GLOVE SURG SS PI 7.0 STRL IVOR (GLOVE) ×2 IMPLANT
GOWN STRL REUS W/ TWL XL LVL3 (GOWN DISPOSABLE) IMPLANT
GOWN STRL REUS W/TWL XL LVL3 (GOWN DISPOSABLE)
HANDLE SUCTION POOLE (INSTRUMENTS) ×2 IMPLANT
HOLDER FOLEY CATH W/STRAP (MISCELLANEOUS) IMPLANT
KIT BASIN OR (CUSTOM PROCEDURE TRAY) ×2 IMPLANT
KIT TURNOVER KIT A (KITS) IMPLANT
LIGASURE IMPACT 36 18CM CVD LR (INSTRUMENTS) IMPLANT
MANIFOLD NEPTUNE II (INSTRUMENTS) ×2 IMPLANT
PACK GENERAL/GYN (CUSTOM PROCEDURE TRAY) ×2 IMPLANT
RELOAD PROXIMATE 75MM BLUE (ENDOMECHANICALS) ×2 IMPLANT
RELOAD STAPLE 75 3.8 BLU REG (ENDOMECHANICALS) IMPLANT
STAPLER 90 3.5 STAND SLIM (STAPLE) ×1
STAPLER 90 3.5 STD SLIM (STAPLE) IMPLANT
STAPLER GUN LINEAR PROX 60 (STAPLE) IMPLANT
STAPLER PROXIMATE 75MM BLUE (STAPLE) IMPLANT
SUCTION POOLE HANDLE (INSTRUMENTS) ×1
SUT NOVA NAB DX-16 0-1 5-0 T12 (SUTURE) IMPLANT
SUT NOVA NAB GS-21 0 18 T12 DT (SUTURE) ×4 IMPLANT
SUT PDS AB 1 CT1 27 (SUTURE) IMPLANT
SUT PROLENE 2 0 BLUE (SUTURE) IMPLANT
SUT SILK 2 0 (SUTURE) ×1
SUT SILK 2 0 SH CR/8 (SUTURE) ×2 IMPLANT
SUT SILK 2-0 18XBRD TIE 12 (SUTURE) ×2 IMPLANT
SUT SILK 3 0 (SUTURE) ×1
SUT SILK 3 0 SH CR/8 (SUTURE) ×2 IMPLANT
SUT SILK 3-0 18XBRD TIE 12 (SUTURE) ×2 IMPLANT
SUT VIC AB 2-0 SH 18 (SUTURE) IMPLANT
SUT VIC AB 2-0 SH 27 (SUTURE) ×3
SUT VIC AB 2-0 SH 27X BRD (SUTURE) ×4 IMPLANT
SUT VIC AB 4-0 PS2 18 (SUTURE) ×2 IMPLANT
TOWEL OR 17X26 10 PK STRL BLUE (TOWEL DISPOSABLE) ×4 IMPLANT
TOWEL OR NON WOVEN STRL DISP B (DISPOSABLE) ×4 IMPLANT
YANKAUER SUCT BULB TIP NO VENT (SUCTIONS) ×2 IMPLANT

## 2022-12-18 NOTE — Discharge Instructions (Addendum)
POST OP INSTRUCTIONS AFTER COLON SURGERY  DIET: Be sure to include lots of fluids daily to stay hydrated - 64oz of water per day (8, 8 oz glasses).  Avoid fast food or heavy meals for the first couple of weeks as your are more likely to get nauseated. Avoid raw/uncooked fruits or vegetables for the first 4 weeks (its ok to have these if they are blended into smoothie form). If you have fruits/vegetables, make sure they are cooked until soft enough to mash on the roof of your mouth and chew your food well. Otherwise, diet as tolerated.  Take your usually prescribed home medications unless otherwise directed.  PAIN CONTROL: Pain is best controlled by a usual combination of three different methods TOGETHER: Ice/Heat Over the counter pain medication Prescription pain medication Most patients will experience some swelling and bruising around the surgical site.  Ice packs or heating pads (30-60 minutes up to 6 times a day) will help. Some people prefer to use ice alone, heat alone, alternating between ice & heat.  Experiment to what works for you.  Swelling and bruising can take several weeks to resolve.   It is helpful to take an over-the-counter pain medication regularly for the first few weeks: Ibuprofen (Motrin/Advil) - 200mg  tabs - take 3 tabs (600mg ) every 6 hours as needed for pain (unless you have been directed previously to avoid NSAIDs/ibuprofen) Acetaminophen (Tylenol) - you may take 650mg  every 6 hours as needed. You can take this with motrin as they act differently on the body. If you are taking a narcotic pain medication that has acetaminophen in it, do not take over the counter tylenol at the same time. NOTE: You may take both of these medications together - most patients find it most helpful when alternating between the two (i.e. Ibuprofen at 6am, tylenol at 9am, ibuprofen at 12pm ..Marland Kitchen) A  prescription for pain medication should be given to you upon discharge.  Take your pain medication as  prescribed if your pain is not adequatly controlled with the over-the-counter pain reliefs mentioned above.  Avoid getting constipated.  Between the surgery and the pain medications, it is common to experience some constipation.  Increasing fluid intake and taking a fiber supplement (such as Metamucil, Citrucel, FiberCon, MiraLax, etc) 1-2 times a day regularly will usually help prevent this problem from occurring.  A mild laxative (prune juice, Milk of Magnesia, MiraLax, etc) should be taken according to package directions if there are no bowel movements after 48 hours.    Dressing: No need to pack the former ileostomy site.  You may cover this with gauze and change once to twice daily.  This will keep the close clean.  It is okay to get your wound wet in the shower with soap and water over the wound.  ACTIVITIES as tolerated:   Avoid heavy lifting (>10lbs or 1 gallon of milk) for the next 6 weeks. You may resume regular daily activities as tolerated--such as daily self-care, walking, climbing stairs--gradually increasing activities as tolerated.  If you can walk 30 minutes without difficulty, it is safe to try more intense activity such as jogging, treadmill, bicycling, low-impact aerobics.  DO NOT PUSH THROUGH PAIN.  Let pain be your guide: If it hurts to do something, don't do it. You may drive when you are no longer taking prescription pain medication, you can comfortably wear a seatbelt, and you can safely maneuver your car and apply brakes.  FOLLOW UP in our office Please call [facility]  at [phone] to set up an appointment to see your surgeon in the office for a follow-up appointment approximately 2 weeks after your surgery. Make sure that you call for this appointment the day you arrive home to insure a convenient appointment time.  9. If you have disability or family leave forms that need to be completed, you may have them completed by your primary care physician's office; for return to work  instructions, please ask our office staff and they will be happy to assist you in obtaining this documentation   When to call us [phone]: Poor pain control Reactions / problems with new medications (rash/itching, etc)  Fever over 101.5 F (38.5 C) Inability to urinate Nausea/vomiting Worsening swelling or bruising Continued bleeding from incision. Increased pain, redness, or drainage from the incision  The clinic staff is available to answer your questions during regular business hours (8:30am-5pm).  Please don't hesitate to call and ask to speak to one of our nurses for clinical concerns.   A surgeon from [facility] is always on call at the hospitals   If you have a medical emergency, go to the nearest emergency room or call 911.  [facility] [address], [place], [place]  [address] MAIN: [phone] FAX: [phone] [email or web address]

## 2022-12-18 NOTE — Op Note (Signed)
12/18/2022  3:50 PM  PATIENT:  Billy Roman  62 y.o. male  Patient Care Team: Rema Fendt, NP as PCP - General (Nurse Practitioner) Ardell Isaacs, RN as Oncology Nurse Navigator Andria Meuse, MD as Consulting Physician (General Surgery) Iva Boop, MD as Consulting Physician (Gastroenterology) Ladene Artist, MD as Consulting Physician (Oncology) Dorothy Puffer, MD as Consulting Physician (Radiation Oncology)  PRE-OPERATIVE DIAGNOSIS:  Loop ileostomy status, history of rectal cancer  POST-OPERATIVE DIAGNOSIS:  Same  PROCEDURE:  Takedown of diverting loop ileostomy  SURGEON:  Stephanie Coup. Taavi Hoose, MD  ASSISTANT: Romie Levee, MD  ANESTHESIA:   local and general  COUNTS:  Sponge, needle and instrument counts were reported correct x2 at the conclusion of the operation.  EBL: 50 mL  DRAINS: None  SPECIMEN: Ileostomy  COMPLICATIONS: None  FINDINGS: Loop ileostomy with normal appearing proximal and distal limbs.  Loop ileostomy takedown through ostomy site.  DISPOSITION: PACU in satisfactory condition  DESCRIPTION: The patient was identified in preop holding and taken to the OR where he was placed on the operating room table. SCDs were placed. General endotracheal anesthesia was induced without difficulty.  Hair on the abdomen was clipped.  Arms were then padded and tucked.  All pressure points were evaluated and padded.  The ostomy appliance was removed.  He was then prepped and draped in the usual sterile fashion. A surgical timeout was performed indicating the correct patient, procedure, positioning and need for preoperative antibiotics.   The loop ileostomy is incised circumferentially near the mucocutaneous junction.  The subcutaneous tissue was then divided electrocautery.  The ileostomy is elevated and subcutaneous fat is dissected free from the surrounding ileum using Metzenbaum scissors.  This was done all way down to the rectus fascia.  A Kocher  clamp was then placed on the fascia and the proximal distal limbs of the ileum and associated mesentery able to be freed from the rectus without difficulty, sharply.  At this point, the entire ileum was free.  There were no adhesions intra-abdominal he precluding movement of the bowel.  Both proximal distal limbs were inspected.  Inherent to the procedure, there is 1 area of deserosalization close to the skin level and for that reason, this was included with the ileostomy when resected.  Attention is directed to excise any ileostomy.  Both the proximal distal limbs were excised using a GIA 75 blue load stapler after creating windows in the mesentery and respective side.  The mesentery involved with the ostomy was then excised using a LigaSure.  The cut edge of the mesentery is inspected noted to be hemostatic.  There is a palpable pulse going out to each respective limb within the mesentery.  They are both pink in color.  Attention is directed to creating the enteroenterostomy.  Orientation is confirmed such that there is no twisting.  An enterotomy is created both the proximal distal limbs just beyond each respective staple line.  A 75 GIA blue load stapler was then used to create the enteroenterostomy.  The staple line is inspected and noted to be hemostatic with well-formed staples.  The common enterotomy is elevated with Allis clamps.  The common enterotomy is then closed using a TA 90 blue load stapler after distracting the anastomotic staple lines.  The staple line is inspected and noted to have well-formed staples.  Some small bleeders on the staple line are controlled with 3-0 silk U stitches.  Each corner of the TA staple line is  then "dunked" using 2-0 silk sutures.  The anastomosis is palpated noted to be widely patent, 3 fingerbreadths in diameter.  A 3-0 silk stitch is placed at the apex of the anastomosis.  The mesenteric defect is then obliterated using interrupted 3-0 silk sutures.  The anastomosis  is reinspected and noted to be pink.  This is then able to be gently placed back into the abdominal cavity and a natural configuration.    The rectus fascia was then closed using 2 running #1 PDS sutures.  This was done in a transverse manner as this resulted in the least amount of tension.  All sponge, needle, and instrument counts are reported correct.  A 2-0 Vicryl suture was used to create a pursestring stitch at the skin level.  This was then cinched down until it is about a fingerbreadth wide.  The wound is then gently packed with moist Kerlix.  A dressing consisting of 4 x 4's, and tape was then placed.  He is then awakened from anesthesia, extubated, and transferred to a stretcher for transport to recovery in satisfactory condition.

## 2022-12-18 NOTE — Transfer of Care (Signed)
Immediate Anesthesia Transfer of Care Note  Patient: THORVALD CAROTENUTO  Procedure(s) Performed: LOOP ILEOSTOMY REVERSAL  Patient Location: PACU  Anesthesia Type:General  Level of Consciousness: sedated  Airway & Oxygen Therapy: Patient Spontanous Breathing and Patient connected to face mask oxygen  Post-op Assessment: Report given to RN and Post -op Vital signs reviewed and stable  Post vital signs: Reviewed and stable  Last Vitals:  Vitals Value Taken Time  BP 165/82 12/18/22 1553  Temp    Pulse 87 12/18/22 1554  Resp 18 12/18/22 1554  SpO2 97 % 12/18/22 1554  Vitals shown include unvalidated device data.  Last Pain:  Vitals:   12/18/22 1229  TempSrc:   PainSc: 0-No pain         Complications: No notable events documented.
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
## 2022-12-18 NOTE — Anesthesia Procedure Notes (Signed)
Procedure Name: Intubation Date/Time: 12/18/2022 2:24 PM  Performed by: Doran Clay, CRNAPre-anesthesia Checklist: Patient identified, Emergency Drugs available, Suction available, Patient being monitored and Timeout performed Patient Re-evaluated:Patient Re-evaluated prior to induction Oxygen Delivery Method: Circle system utilized Preoxygenation: Pre-oxygenation with 100% oxygen Induction Type: IV induction, Rapid sequence and Cricoid Pressure applied Laryngoscope Size: Mac and 4 Grade View: Grade I Tube type: Oral Tube size: 7.5 mm Number of attempts: 1 Airway Equipment and Method: Stylet Placement Confirmation: ETT inserted through vocal cords under direct vision, positive ETCO2 and breath sounds checked- equal and bilateral Secured at: 23 cm Tube secured with: Tape Dental Injury: Teeth and Oropharynx as per pre-operative assessment

## 2022-12-19 LAB — CBC
HCT: 45.6 % (ref 39.0–52.0)
Hemoglobin: 14.9 g/dL (ref 13.0–17.0)
MCH: 29.8 pg (ref 26.0–34.0)
MCHC: 32.7 g/dL (ref 30.0–36.0)
MCV: 91.2 fL (ref 80.0–100.0)
Platelets: 238 10*3/uL (ref 150–400)
RBC: 5 MIL/uL (ref 4.22–5.81)
RDW: 14.6 % (ref 11.5–15.5)
WBC: 8.2 10*3/uL (ref 4.0–10.5)
nRBC: 0 % (ref 0.0–0.2)

## 2022-12-19 LAB — BASIC METABOLIC PANEL
Anion gap: 13 (ref 5–15)
BUN: 12 mg/dL (ref 6–20)
CO2: 24 mmol/L (ref 22–32)
Calcium: 9 mg/dL (ref 8.9–10.3)
Chloride: 101 mmol/L (ref 98–111)
Creatinine, Ser: 0.71 mg/dL (ref 0.61–1.24)
GFR, Estimated: >60 mL/min (ref >60)
Glucose, Bld: 131 mg/dL — ABNORMAL HIGH (ref 70–99)
Potassium: 3.7 mmol/L (ref 3.5–5.1)
Sodium: 138 mmol/L (ref 135–145)

## 2022-12-19 MED ORDER — MORPHINE SULFATE (PF) 2 MG/ML IV SOLN
1.0000 mg | INTRAVENOUS | Status: DC | PRN
  Administered 2022-12-19 (×2): 2 mg via INTRAVENOUS
  Administered 2022-12-19: 1 mg via INTRAVENOUS
  Administered 2022-12-20: 2 mg via INTRAVENOUS
  Filled 2022-12-19 (×4): qty 1

## 2022-12-19 NOTE — Progress Notes (Signed)
OT Cancellation Note  Patient Details Name: [name] MRN: [id number] DOB: 07-Feb-1961   Cancelled Treatment:    Reason Eval/Treat Not Completed: Fatigue/lethargy limiting ability to participate: Pt refused due to "no sleep last night."  Pt refused for OT to try again in PM. Pt reported "maybe tomorrow".   Theodoro Clock 12/19/2022, 10:53 AM

## 2022-12-19 NOTE — Progress Notes (Signed)
1 Day Post-Op   Subjective/Chief Complaint: Had large episode of emesis pretty soon after receiving dilaudid twice.  Otherwise, tolerated clears well.  Had large solid BM with scant flatus.     Objective: Vital signs in last 24 hours: Temp:  [97.8 F (36.6 C)-99.1 F (37.3 C)] 98 F (36.7 C) (07/04 0522) Pulse Rate:  [69-87] 78 (07/04 0522) Resp:  [12-22] 18 (07/04 0522) BP: (108-165)/(74-92) 159/88 (07/04 0522) SpO2:  [92 %-100 %] 100 % (07/04 0522) Weight:  [113.6 kg] 113.6 kg (07/03 1229) Last BM Date :  (ileostomy, had no output)  Intake/Output from previous day: 07/03 0701 - 07/04 0700 In: 2784.3 [P.O.:1077; I.V.:1707.3] Out: 1390 [Urine:690; Emesis/NG output:700] Intake/Output this shift: No intake/output data recorded.  General:  Alert and oriented.   Resp:  breathing comfortably Abd: soft, tr bloating, approp tender.  Ostomy site very wet so dressing changed and packing removed.    Lab Results:  Recent Labs    12/19/22 0421  WBC 8.2  HGB 14.9  HCT 45.6  PLT 238   BMET Recent Labs    12/19/22 0421  NA 138  K 3.7  CL 101  CO2 24  GLUCOSE 131*  BUN 12  CREATININE 0.71  CALCIUM 9.0   PT/INR No results for input(s): "LABPROT", "INR" in the last 72 hours. ABG No results for input(s): "PHART", "HCO3" in the last 72 hours.  Invalid input(s): "PCO2", "PO2"  Studies/Results: No results found.  Anti-infectives: Anti-infectives (From admission, onward)    Start     Dose/Rate Route Frequency Ordered Stop   12/18/22 1215  cefoTEtan (CEFOTAN) 2 g in sodium chloride 0.9 % 100 mL IVPB        2 g 200 mL/hr over 30 Minutes Intravenous On call to O.R. 12/18/22 1207 12/18/22 1430       Assessment/Plan: s/p Procedure(s): LOOP ILEOSTOMY REVERSAL (N/A) POD 1  Advance diet. Change to morphine.  Electrolytes ok Lovenox for VTE ppx. Ambulate/pulmonary toilet Await return of bowel function   LOS: 1 day    Almond Lint 12/19/2022

## 2022-12-19 NOTE — Evaluation (Signed)
Physical Therapy Evaluation Patient Details Name: Billy Roman MRN: [id number] DOB: [birth year] Today's Date: 12/19/2022  History of Present Illness  Patient is 62 y.o. male admitted on 12/18/2022 for surgical takedown of diverting loop ileostomy. Pt underwent robotic assisted low anterior resection w/ diverting loop ileostomy on 09/04/22. PMH includes but is not limited to: depression, tobacco abuse, COPD, OSA, anxiety, sleep apnea, and  rectal CA.  Clinical Impression    Pt admitted with above diagnosis.  Pt currently with functional limitations due to the deficits listed below (see PT Problem List).  Rehab tech in room when PT arrived. Pt reported need to void. Pt required S and min cues for abdominal precautions with supine <> sit, S and min cues for safety for sit to stand to IV pole, gait tasks 15 x 2 with IV pole to and from bathroom, pt reports mild instability, not noted by therapist. Pt reports 4 falls in past 6 months, B LE abd sensation and L UE abd sensation and new onset of L UE tremors with recent neurologist appointment. Pt is motivated to return to personal home and will have some assist from daughter.  Pt will benefit from acute skilled PT to increase their independence and safety with mobility to allow discharge.         Assistance Recommended at Discharge Intermittent Supervision/Assistance  If plan is discharge home, recommend the following:  Can travel by private vehicle  A little help with walking and/or transfers;A little help with bathing/dressing/bathroom;Assistance with cooking/housework;Assist for transportation;Help with stairs or ramp for entrance        Equipment Recommendations None recommended by PT (RW in home setting)  Recommendations for Other Services       Functional Status Assessment Patient has had a recent decline in their functional status and demonstrates the ability to make significant improvements in function in a reasonable and predictable  amount of time.     Precautions / Restrictions Precautions Precautions: Fall Restrictions Weight Bearing Restrictions: No      Mobility  Bed Mobility Overal bed mobility: Needs Assistance Bed Mobility: Supine to Sit, Sit to Supine     Supine to sit: Supervision Sit to supine: Supervision   General bed mobility comments: min cues for technique and abdominal precuations with HOB elevated    Transfers Overall transfer level: Needs assistance Equipment used: None (IV pole) Transfers: Sit to/from Stand Sit to Stand: Supervision           General transfer comment: min cues for safety and mild instaibility noted    Ambulation/Gait Ambulation/Gait assistance: Supervision Gait Distance (Feet): 15 Feet (x 2) Assistive device: IV Pole Gait Pattern/deviations: Step-through pattern, Trunk flexed Gait velocity: decreased     General Gait Details: pt reports B LE instabilty with gait, no apparent LOB or instabiltiy noted with use of IV pole. pt declined to amb in hallway and gait assessed in personal room to and from the bathroom  Stairs            Wheelchair Mobility     Tilt Bed    Modified Rankin (Stroke Patients Only)       Balance Overall balance assessment: Needs assistance, History of Falls (4 falls in past 6 months) Sitting-balance support: Feet supported Sitting balance-Leahy Scale: Good     Standing balance support: Single extremity supported, During functional activity, Reliant on assistive device for balance Standing balance-Leahy Scale: Fair  Pertinent Vitals/Pain Pain Assessment Pain Assessment: 0-10 Pain Score: 7  Pain Descriptors / Indicators: Aching, Constant, Discomfort, Operative site guarding Pain Intervention(s): Limited activity within patient's tolerance, Monitored during session, Patient requesting pain meds-RN notified    Home Living Family/patient expects to be discharged to:: Private  residence Living Arrangements: Alone (daughter avaible to assist at time of d/c and is CNA) Available Help at Discharge: Family;Friend(s);Available PRN/intermittently Type of Home: House Home Access: Stairs to enter Entrance Stairs-Rails: Left Entrance Stairs-Number of Steps: 4 Alternate Level Stairs-Number of Steps: 8 or 9 Home Layout: Two level;Bed/bath upstairs Home Equipment: Agricultural consultant (2 wheels)      Prior Function Prior Level of Function : Independent/Modified Independent;Driving             Mobility Comments: IND no AD       Hand Dominance   Dominant Hand: Right    Extremity/Trunk Assessment        Lower Extremity Assessment Lower Extremity Assessment: Generalized weakness (distal B LE abn sensation, L UE tremor. L RTC repair and apparent limited ROM)    Cervical / Trunk Assessment Cervical / Trunk Assessment:  (head forward)  Communication   Communication: No difficulties  Cognition Arousal/Alertness: Awake/alert Behavior During Therapy: WFL for tasks assessed/performed Overall Cognitive Status: Within Functional Limits for tasks assessed                                          General Comments      Exercises     Assessment/Plan    PT Assessment Patient needs continued PT services  PT Problem List Decreased activity tolerance;Decreased balance;Decreased mobility;Decreased coordination;Pain       PT Treatment Interventions DME instruction;Gait training;Stair training;Functional mobility training;Therapeutic activities;Therapeutic exercise;Balance training;Neuromuscular re-education;Patient/family education    PT Goals (Current goals can be found in the Care Plan section)  Acute Rehab PT Goals Patient Stated Goal: to get some rest PT Goal Formulation: With patient Time For Goal Achievement: 01/02/23 Potential to Achieve Goals: Good    Frequency Min 1X/week     Co-evaluation               AM-PAC PT "6 Clicks"  Mobility  Outcome Measure Help needed turning from your back to your side while in a flat bed without using bedrails?: None Help needed moving from lying on your back to sitting on the side of a flat bed without using bedrails?: None Help needed moving to and from a bed to a chair (including a wheelchair)?: None Help needed standing up from a chair using your arms (e.g., wheelchair or bedside chair)?: A Little Help needed to walk in hospital room?: A Little Help needed climbing 3-5 steps with a railing? : A Lot 6 Click Score: 20    End of Session Equipment Utilized During Treatment: Gait belt Activity Tolerance: Patient limited by pain;Patient limited by fatigue (pt reports exhausted and no sleep last night) Patient left: in bed;with call bell/phone within reach Nurse Communication: Mobility status;Patient requests pain meds PT Visit Diagnosis: Unsteadiness on feet (R26.81);Other abnormalities of gait and mobility (R26.89);Difficulty in walking, not elsewhere classified (R26.2);Pain;History of falling (Z91.81)    Time: 1610-9604 PT Time Calculation (min) (ACUTE ONLY): 15 min   Charges:   PT Evaluation $PT Eval Low Complexity: 1 Low   PT General Charges $$ ACUTE PT VISIT: 1 Visit  Johnny Bridge, PT Acute Rehab   Jacqualyn Posey 12/19/2022, 11:11 AM

## 2022-12-20 ENCOUNTER — Encounter (HOSPITAL_COMMUNITY): Payer: Self-pay | Admitting: Surgery

## 2022-12-20 ENCOUNTER — Other Ambulatory Visit (HOSPITAL_COMMUNITY): Payer: Self-pay

## 2022-12-20 LAB — BASIC METABOLIC PANEL
Anion gap: 9 (ref 5–15)
BUN: 12 mg/dL (ref 6–20)
CO2: 25 mmol/L (ref 22–32)
Calcium: 8.5 mg/dL — ABNORMAL LOW (ref 8.9–10.3)
Chloride: 102 mmol/L (ref 98–111)
Creatinine, Ser: 0.58 mg/dL — ABNORMAL LOW (ref 0.61–1.24)
GFR, Estimated: >60 mL/min (ref >60)
Glucose, Bld: 101 mg/dL — ABNORMAL HIGH (ref 70–99)
Potassium: 3.6 mmol/L (ref 3.5–5.1)
Sodium: 136 mmol/L (ref 135–145)

## 2022-12-20 LAB — CBC
HCT: 40.6 % (ref 39.0–52.0)
Hemoglobin: 13.4 g/dL (ref 13.0–17.0)
MCH: 30.5 pg (ref 26.0–34.0)
MCHC: 33 g/dL (ref 30.0–36.0)
MCV: 92.3 fL (ref 80.0–100.0)
Platelets: 203 10*3/uL (ref 150–400)
RBC: 4.4 MIL/uL (ref 4.22–5.81)
RDW: 15 % (ref 11.5–15.5)
WBC: 8 10*3/uL (ref 4.0–10.5)
nRBC: 0 % (ref 0.0–0.2)

## 2022-12-20 LAB — SURGICAL PATHOLOGY

## 2022-12-20 MED ORDER — HYDROMORPHONE HCL 1 MG/ML IJ SOLN
0.5000 mg | INTRAMUSCULAR | Status: DC | PRN
  Administered 2022-12-20 – 2022-12-21 (×4): 0.5 mg via INTRAVENOUS
  Filled 2022-12-20 (×4): qty 0.5

## 2022-12-20 MED ORDER — OXYCODONE HCL 5 MG PO TABS
5.0000 mg | ORAL_TABLET | Freq: Four times a day (QID) | ORAL | Status: DC | PRN
  Administered 2022-12-20 – 2022-12-22 (×9): 5 mg via ORAL
  Filled 2022-12-20 (×9): qty 1

## 2022-12-20 MED ORDER — OXYCODONE HCL 5 MG PO TABS
5.0000 mg | ORAL_TABLET | Freq: Four times a day (QID) | ORAL | 0 refills | Status: DC | PRN
  Filled 2022-12-21: qty 20, 5d supply, fill #0

## 2022-12-20 NOTE — Progress Notes (Signed)
PT Cancellation Note  Patient Details Name: Billy Roman MRN: [id number] DOB: [birth year]   Cancelled Treatment:     PT attempted x 2 this date but deferred at pt request 2* fatigue in am following OT and pain in pm with pt requesting pain meds and RN aware.  Will follow.   Ksenia Kunz 12/20/2022, 3:25 PM

## 2022-12-20 NOTE — Progress Notes (Signed)
  Subjective No acute events. Feeling reasonably well. Intermittent nausea, mild bloating. Having flatus and liquid Bms. On solids  Objective: Vital signs in last 24 hours: Temp:  [97.6 F (36.4 C)-98.5 F (36.9 C)] 98.5 F (36.9 C) (07/05 0502) Pulse Rate:  [65-77] 65 (07/05 0502) Resp:  [18] 18 (07/05 0502) BP: (111-142)/(67-81) 111/81 (07/05 0502) SpO2:  [95 %-97 %] 96 % (07/05 0502) Weight:  [409 kg] 118 kg (07/05 0500) Last BM Date :  (ileostomy, had no output)  Intake/Output from previous day: 07/04 0701 - 07/05 0700 In: 1680 [P.O.:1680] Out: 600 [Urine:600] Intake/Output this shift: No intake/output data recorded.  Gen: NAD, comfortable CV: RRR Pulm: Normal work of breathing Abd: Soft, NT/ND. Ileostomy site clean, dressing dry. Ext: SCDs in place  Lab Results: CBC  Recent Labs    12/19/22 0421 12/20/22 0433  WBC 8.2 8.0  HGB 14.9 13.4  HCT 45.6 40.6  PLT 238 203   BMET Recent Labs    12/19/22 0421 12/20/22 0433  NA 138 136  K 3.7 3.6  CL 101 102  CO2 24 25  GLUCOSE 131* 101*  BUN 12 12  CREATININE 0.71 0.58*  CALCIUM 9.0 8.5*   PT/INR No results for input(s): "LABPROT", "INR" in the last 72 hours. ABG No results for input(s): "PHART", "HCO3" in the last 72 hours.  Invalid input(s): "PCO2", "PO2"  Studies/Results:  Anti-infectives: Anti-infectives (From admission, onward)    Start     Dose/Rate Route Frequency Ordered Stop   12/18/22 1215  cefoTEtan (CEFOTAN) 2 g in sodium chloride 0.9 % 100 mL IVPB        2 g 200 mL/hr over 30 Minutes Intravenous On call to O.R. 12/18/22 1207 12/18/22 1500        Assessment/Plan: Patient Active Problem List   Diagnosis Date Noted   S/P closure of ileostomy 12/18/2022   Essential tremor 12/09/2022   Irritant contact dermatitis associated with fecal stoma 10/13/2022   Right inguinal hernia 09/05/2022   Insomnia 09/05/2022   Chronic pain 09/05/2022   S/P robot-assisted surgical procedure  09/04/2022   Ileostomy care (HCC) 09/04/2022   Rectal cancer (HCC) 11/20/2021   Cognitive impairment 09/18/2021   Confusion 09/18/2021   Tremor 09/18/2021   Memory loss 08/24/2021   Tremor of both hands 08/24/2021   H/O rotator cuff surgery 08/24/2021   History of COVID-19 08/24/2021   Smoker 08/24/2021   [name], recurrent severe, without psychosis (HCC) 08/23/2021   Stiffness of right shoulder joint 02/09/2021   Pain in joint of right shoulder 02/09/2021   HNP (herniated nucleus pulposus), lumbar 01/15/2021   Sciatica 01/15/2021   Anxiety and depression 08/23/2009   s/p Procedure(s): LOOP ILEOSTOMY REVERSAL 12/18/2022  -Change dressing PRN - not unexpected to have to change 2-3x/day -Soft diet, chew food well -Ambulate - working with PT/OT -PPX: SCDs, SQH -Dispo: possibly home tomorrow if n/v improved    LOS: 2 days   Marin Olp, MD [facility], [facility]

## 2022-12-20 NOTE — Evaluation (Signed)
Occupational Therapy Evaluation and Discharge Patient Details Name: Billy Roman MRN: [id number] DOB: [birth year] Today's Date: 12/20/2022   History of Present Illness Patient is 62 y.o. male admitted on 12/18/2022 for surgical takedown of diverting loop ileostomy. Pt underwent robotic assisted low anterior resection w/ diverting loop ileostomy on 09/04/22. PMH includes but is not limited to: depression, tobacco abuse, COPD, OSA, anxiety, sleep apnea, and  rectal CA.   Clinical Impression   This 62 yo male admitted and underwent above presents to acute OT with PLOF of being independent with all basic ADLs and IADLs. Currently he is at an independent to Mod I level for basic ADLs in his hospital room and he feels he will feel even better about things at home since it is so familiar to him. No further OT needs, we will sign off.      Recommendations for follow up therapy are one component of a multi-disciplinary discharge planning process, led by the attending physician.  Recommendations may be updated based on patient status, additional functional criteria and insurance authorization.   Assistance Recommended at Discharge (P) PRN  Patient can return home with the following (P) Assist for transportation;Assistance with cooking/housework    Functional Status Assessment  (P) Patient has had a recent decline in their functional status and demonstrates the ability to make significant improvements in function in a reasonable and predictable amount of time. (without further skilled OT services, all education has been completed and pt is at an independent/Mod I level)        Precautions / Restrictions Precautions Precautions: Fall Restrictions Weight Bearing Restrictions: No      Mobility Bed Mobility    Mod I (HOB up)                Transfers  Mod I (increased time due to pain)                        Balance Overall balance assessment: Independent (when up and about in  his room here--he reports he has at times gotten his feet tangled up and fallen (but not while here at hosptial))                                         ADL either performed or assessed with clinical judgement   ADL Overall ADL's : Needs assistance/impaired Eating/Feeding: Independent   Grooming: Wash/dry hands;Independent Grooming Details (indicate cue type and reason): Bending over the sink makes his abdomen painful--I educated him on use of 2 cups to avoid bending over the sink while brushing his teeth Upper Body Bathing: Independent;Sitting;Standing   Lower Body Bathing: Sit to/from stand;Modified independent   Upper Body Dressing : Independent;Sitting;Standing   Lower Body Dressing: Modified independent;Sit to/from stand Lower Body Dressing Details (indicate cue type and reason): Pt reports he was able to get his boxer shorts on by himself and in his room he has a pair of slip on shoes with non collapsable back Toilet Transfer: Modified Independent   Toileting- Clothing Manipulation and Hygiene: Modified independent     Tub/Shower Transfer Details (indicate cue type and reason): will have to sponge bath until MD clears him for a shower. He reports he has a hand held shower and grab bars in tub (from when his was taking care of his wife at home whom has MS)  Vision Patient Visual Report: No change from baseline              Pertinent Vitals/Pain Pain Assessment Pain Assessment: 0-10 Pain Score: 4  Pain Intervention(s): Limited activity within patient's tolerance, Monitored during session, Repositioned     Hand Dominance Right   Extremity/Trunk Assessment Upper Extremity Assessment Upper Extremity Assessment: Overall WFL for tasks assessed           Communication Communication Communication: No difficulties   Cognition Arousal/Alertness: Awake/alert Behavior During Therapy: WFL for tasks assessed/performed Overall Cognitive Status:  Within Functional Limits for tasks assessed                                                  Home Living Family/patient expects to be discharged to:: Private residence Living Arrangements: 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, is a Lawyer) Available Help at Discharge: Family;Friend(s);Available PRN/intermittently Type of Home: House Home Access: Stairs to enter [employer] of Steps: 4 Entrance Stairs-Rails: Left Home Layout: Two level;Bed/bath upstairs Alternate Level Stairs-Number of Steps: 8 or 9 Alternate Level Stairs-Rails: Left Bathroom Shower/Tub: Tub/shower unit;Curtain   Bathroom Toilet: Standard Bathroom Accessibility: Yes How Accessible: Accessible via walker Home Equipment: Rolling Walker (2 wheels)          Prior Functioning/Environment Prior Level of Function : Independent/Modified Independent;Driving             Mobility Comments: Independent no AD          OT Problem List: Pain         OT Goals(Current goals can be found in the care plan section) Acute Rehab OT Goals Patient Stated Goal: to feel better and possibly go home tomorrow         AM-PAC OT "6 Clicks" Daily Activity     Outcome Measure Help from another person eating meals?: None Help from another person taking care of personal grooming?: None Help from another person toileting, which includes using toliet, bedpan, or urinal?: None Help from another person bathing (including washing, rinsing, drying)?: None Help from another person to put on and taking off regular upper body clothing?: None Help from another person to put on and taking off regular lower body clothing?: None 6 Click Score: 24   End of Session    Activity Tolerance: Patient tolerated treatment well Patient left: in bed;with bed alarm set (pt requesting his bed be raised up so when he has urgency to get to bathroom (due to recent colon surgery) he won't have to take the time to do this before he get  sup)  OT Visit Diagnosis: Pain Pain - part of body:  (abdomen)                Time: 1610-9604 OT Time Calculation (min): 35 min Charges:  OT General Charges $OT Visit: 1 Visit OT Evaluation $OT Eval Moderate Complexity: 1 Mod OT Treatments $Self Care/Home Management : 8-22 mins  Lindon Romp OT Acute Rehabilitation Services Office [phone]    Evette Georges 12/20/2022, 9:26 AM

## 2022-12-21 ENCOUNTER — Other Ambulatory Visit (HOSPITAL_COMMUNITY): Payer: Self-pay

## 2022-12-21 NOTE — Progress Notes (Signed)
Physical Therapy Treatment Patient Details Name: Billy Roman MRN: [id number] DOB: [birth year] Today's Date: 12/21/2022   History of Present Illness Patient is 62 y.o. male admitted on 12/18/2022 for surgical takedown of diverting loop ileostomy. Pt underwent robotic assisted low anterior resection w/ diverting loop ileostomy on 09/04/22. PMH includes but is not limited to: depression, tobacco abuse, COPD, OSA, anxiety, sleep apnea, and  rectal CA.    PT Comments  Pt cooperative and demonstrating decreasing level of assist and improved activity tolerance and balance.  Pt up to ambulate in hall for first time but declines to attempt stairs "this is enough for today".  Pt may want to attempt stairs prior to dc.     Assistance Recommended at Discharge PRN  If plan is discharge home, recommend the following:  Can travel by private vehicle    Assistance with cooking/housework;Assist for transportation;Help with stairs or ramp for entrance      Equipment Recommendations  None recommended by PT    Recommendations for Other Services       Precautions / Restrictions Precautions Precautions: Fall Restrictions Weight Bearing Restrictions: No     Mobility  Bed Mobility Overal bed mobility: Modified Independent             General bed mobility comments: Pt utilizing HOB elevation to exit - has similar bed at home.    Transfers Overall transfer level: Needs assistance   Transfers: Sit to/from Stand Sit to Stand: Supervision                Ambulation/Gait Ambulation/Gait assistance: Supervision Gait Distance (Feet): 200 Feet Assistive device: None Gait Pattern/deviations: Step-through pattern, Shuffle Gait velocity: decreased     General Gait Details: mild initial instability with pt touching wall but progressive improvement with increased distance ambulated.   Stairs Stairs:  (Pt declines to attempt stairs this am)           Wheelchair Mobility      Tilt Bed    Modified Rankin (Stroke Patients Only)       Balance Overall balance assessment: Mild deficits observed, not formally tested                                          Cognition Arousal/Alertness: Awake/alert Behavior During Therapy: WFL for tasks assessed/performed Overall Cognitive Status: Within Functional Limits for tasks assessed                                          Exercises      General Comments        Pertinent Vitals/Pain Pain Assessment Pain Assessment: 0-10 Pain Score: 6  Pain Descriptors / Indicators: Aching, Constant, Discomfort, Operative site guarding Pain Intervention(s): Limited activity within patient's tolerance, Monitored during session, Premedicated before session    Home Living                          Prior Function            PT Goals (current goals can now be found in the care plan section) Acute Rehab PT Goals Patient Stated Goal: REgain IND PT Goal Formulation: With patient Time For Goal Achievement: 01/02/23 Potential to Achieve Goals: Good Progress towards PT goals: Progressing toward  goals    Frequency    Min 1X/week      PT Plan Discharge plan needs to be updated    Co-evaluation              AM-PAC PT "6 Clicks" Mobility   Outcome Measure  Help needed turning from your back to your side while in a flat bed without using bedrails?: None Help needed moving from lying on your back to sitting on the side of a flat bed without using bedrails?: None Help needed moving to and from a bed to a chair (including a wheelchair)?: None Help needed standing up from a chair using your arms (e.g., wheelchair or bedside chair)?: None Help needed to walk in hospital room?: A Little Help needed climbing 3-5 steps with a railing? : A Lot 6 Click Score: 21    End of Session Equipment Utilized During Treatment: Gait belt Activity Tolerance: Patient tolerated treatment  well;Patient limited by pain Patient left: Other (comment) (sitting EOB) Nurse Communication: Mobility status PT Visit Diagnosis: Unsteadiness on feet (R26.81);Other abnormalities of gait and mobility (R26.89);Difficulty in walking, not elsewhere classified (R26.2);Pain;History of falling (Z91.81)     Time: 1610-9604 PT Time Calculation (min) (ACUTE ONLY): 13 min  Charges:    $Gait Training: 8-22 mins PT General Charges $$ ACUTE PT VISIT: 1 Visit                     Mauro Kaufmann PT Acute Rehabilitation Services Pager [phone] Office [phone]    Bhavana Kady 12/21/2022, 12:24 PM

## 2022-12-21 NOTE — Progress Notes (Signed)
  3 Days Post-Op   Chief Complaint/Subjective: Tolerating diet, multiple loose BMs, concern for darkened urine  Objective: Vital signs in last 24 hours: Temp:  [97.8 F (36.6 C)-98.2 F (36.8 C)] 97.8 F (36.6 C) (07/06 0631) Pulse Rate:  [65-70] 65 (07/06 0631) Resp:  [16-20] 17 (07/06 0631) BP: (117-126)/(70-82) 126/78 (07/06 0631) SpO2:  [95 %-96 %] 96 % (07/06 0631) Weight:  [118.1 kg] 118.1 kg (07/06 0500) Last BM Date : 12/20/22 Intake/Output from previous day: 07/05 0701 - 07/06 0700 In: 1800 [P.O.:1800] Out: 200 [Urine:200]  PE: Gen: NAD Resp: nonlabored Card: RRR Abd: soft, appropriately tender, incision without erythema, moderate thin yellow drainage  Lab Results:  Recent Labs    12/19/22 0421 12/20/22 0433  WBC 8.2 8.0  HGB 14.9 13.4  HCT 45.6 40.6  PLT 238 203   Recent Labs    12/19/22 0421 12/20/22 0433  NA 138 136  K 3.7 3.6  CL 101 102  CO2 24 25  GLUCOSE 131* 101*  BUN 12 12  CREATININE 0.71 0.58*  CALCIUM 9.0 8.5*   No results for input(s): "LABPROT", "INR" in the last 72 hours.    Component Value Date/Time   NA 136 12/20/2022 0433   NA 139 01/15/2021 1032   K 3.6 12/20/2022 0433   CL 102 12/20/2022 0433   CO2 25 12/20/2022 0433   GLUCOSE 101 (H) 12/20/2022 0433   BUN 12 12/20/2022 0433   BUN 11 01/15/2021 1032   CREATININE 0.58 (L) 12/20/2022 0433   CREATININE 0.73 08/23/2022 1122   CALCIUM 8.5 (L) 12/20/2022 0433   PROT 7.4 08/27/2022 1107   PROT 7.5 09/18/2021 1603   ALBUMIN 4.1 08/27/2022 1107   ALBUMIN 4.6 01/15/2021 1032   AST 32 08/27/2022 1107   AST 43 (H) 06/24/2022 1407   ALT 32 08/27/2022 1107   ALT 56 (H) 06/24/2022 1407   ALKPHOS 55 08/27/2022 1107   BILITOT 0.6 08/27/2022 1107   BILITOT 0.5 06/24/2022 1407   GFRNONAA >60 12/20/2022 0433   GFRNONAA >60 08/23/2022 1122   GFRAA 117 07/26/2019 1218    Assessment/Plan  s/p Procedure(s): LOOP ILEOSTOMY REVERSAL 12/18/2022    FEN - reg diet VTE - lovenox ID  - no issues Disposition - BMP in am, continue fluid intake, advance to reg diet   LOS: 3 days   I reviewed last 24 h vitals and pain scores, last 48 h intake and output, last 24 h labs and trends, and last 24 h imaging results.  This care required high  level of medical decision making.   De Blanch [facility] 12/21/2022, 7:49 AM Please see Amion for pager number during day hours 7:00am-4:30pm or 7:00am -11:30am on weekends

## 2022-12-22 LAB — BASIC METABOLIC PANEL
Anion gap: 9 (ref 5–15)
BUN: 12 mg/dL (ref 6–20)
CO2: 24 mmol/L (ref 22–32)
Calcium: 8.5 mg/dL — ABNORMAL LOW (ref 8.9–10.3)
Chloride: 102 mmol/L (ref 98–111)
Creatinine, Ser: 0.58 mg/dL — ABNORMAL LOW (ref 0.61–1.24)
GFR, Estimated: >60 mL/min (ref >60)
Glucose, Bld: 90 mg/dL (ref 70–99)
Potassium: 3.5 mmol/L (ref 3.5–5.1)
Sodium: 135 mmol/L (ref 135–145)

## 2022-12-22 MED ORDER — OXYCODONE HCL 5 MG PO TABS: 5.0000 mg | ORAL_TABLET | Freq: Four times a day (QID) | ORAL | 0 refills | Status: AC | PRN

## 2022-12-22 NOTE — Plan of Care (Signed)
  Problem: Education: Goal: Understanding of discharge needs will improve Outcome: Adequate for Discharge Goal: Verbalization of understanding of the causes of altered bowel function will improve Outcome: Adequate for Discharge   Problem: Activity: Goal: Ability to tolerate increased activity will improve Outcome: Adequate for Discharge   Problem: Bowel/Gastric: Goal: Gastrointestinal status for postoperative course will improve Outcome: Adequate for Discharge   Problem: Health Behavior/Discharge Planning: Goal: Identification of community resources to assist with postoperative recovery needs will improve Outcome: Adequate for Discharge   Problem: Nutritional: Goal: Will attain and maintain optimal nutritional status will improve Outcome: Adequate for Discharge   Problem: Clinical Measurements: Goal: Postoperative complications will be avoided or minimized Outcome: Adequate for Discharge   Problem: Respiratory: Goal: Respiratory status will improve Outcome: Adequate for Discharge   Problem: Skin Integrity: Goal: Will show signs of wound healing Outcome: Adequate for Discharge   Problem: Education: Goal: Knowledge of General Education information will improve Description: Including pain rating scale, medication(s)/side effects and non-pharmacologic comfort measures Outcome: Adequate for Discharge   Problem: Health Behavior/Discharge Planning: Goal: Ability to manage health-related needs will improve Outcome: Adequate for Discharge   Problem: Clinical Measurements: Goal: Ability to maintain clinical measurements within normal limits will improve Outcome: Adequate for Discharge Goal: Will remain free from infection Outcome: Adequate for Discharge Goal: Diagnostic test results will improve Outcome: Adequate for Discharge Goal: Respiratory complications will improve Outcome: Adequate for Discharge Goal: Cardiovascular complication will be avoided Outcome: Adequate for  Discharge   Problem: Activity: Goal: Risk for activity intolerance will decrease Outcome: Adequate for Discharge   Problem: Nutrition: Goal: Adequate nutrition will be maintained Outcome: Adequate for Discharge   Problem: Coping: Goal: Level of anxiety will decrease Outcome: Adequate for Discharge   Problem: Elimination: Goal: Will not experience complications related to bowel motility Outcome: Adequate for Discharge Goal: Will not experience complications related to urinary retention Outcome: Adequate for Discharge   Problem: Pain Managment: Goal: General experience of comfort will improve Outcome: Adequate for Discharge   Problem: Safety: Goal: Ability to remain free from injury will improve Outcome: Adequate for Discharge   Problem: Skin Integrity: Goal: Risk for impaired skin integrity will decrease Outcome: Adequate for Discharge   Problem: Acute Rehab PT Goals(only PT should resolve) Goal: Pt Will Go Supine/Side To Sit Outcome: Adequate for Discharge Goal: Pt Will Transfer Bed To Chair/Chair To Bed Outcome: Adequate for Discharge Goal: Pt Will Ambulate Outcome: Adequate for Discharge Goal: Pt Will Go Up/Down Stairs Outcome: Adequate for Discharge Goal: Pt/caregiver will Perform Home Exercise Program Outcome: Adequate for Discharge

## 2022-12-22 NOTE — Progress Notes (Signed)
Assessment unchanged. Pt verbalized understanding of dc instructions through teach back including medications, wound care and follow up care. Discharged via wc to front entrance accompanied by NT.

## 2022-12-22 NOTE — TOC Initial Note (Signed)
Transition of Care [facility]) - Initial/Assessment Note    Patient Details  Name: Billy Roman MRN: [id number] Date of Birth: April 03, 1961  Transition of Care [facility]) CM/SW Contact:    Adrian Prows, RN Phone Number: 12/22/2022, 9:08 AM  Clinical Narrative:                 SDOH risks; spoke w/ pt in room; pt says he is from home and plans to return at d/c; he identified POC, dtr Dhani Fahrer [phone]); pt denies IPV, food/housing insecurity, and difficulty paying utilities; pt says he has transportation home; he says he needs transportation resources so that he can get to appts and food shopping; pt says he has reading glasses; pt says he does not have DME, HH services, or home oxygen; pt notified resource for transportation in d/c instructions; he verbalized understanding and will contact agencies of choice; no TOC needs.  Expected Discharge Plan: Home/Self Care Barriers to Discharge: No Barriers Identified   Patient Goals and CMS Choice Patient states their goals for this hospitalization and ongoing recovery are:: home          Expected Discharge Plan and Services In-house Referral:  (n/a)   Post Acute Care Choice: NA Living arrangements for the past 2 months: Single Family Home Expected Discharge Date: 12/22/22               DME Arranged: N/A DME Agency: NA       HH Arranged: NA HH Agency: NA        Prior Living Arrangements/Services Living arrangements for the past 2 months: Single Family Home Lives with:: Self Patient language and need for interpreter reviewed:: Yes Do you feel safe going back to the place where you live?: Yes      Need for Family Participation in Patient Care: Yes (Comment) Care giver support system in place?: Yes (comment)   Criminal Activity/Legal Involvement Pertinent to Current Situation/Hospitalization: No - Comment as needed  Activities of Daily Living Home Assistive Devices/Equipment: Walker (specify type) ADL  Screening (condition at time of admission) Patient's cognitive ability adequate to safely complete daily activities?: Yes Is the patient deaf or have difficulty hearing?: No Does the patient have difficulty seeing, even when wearing glasses/contacts?: Yes Does the patient have difficulty concentrating, remembering, or making decisions?: No Patient able to express need for assistance with ADLs?: Yes Does the patient have difficulty dressing or bathing?: No Independently performs ADLs?: Yes (appropriate for developmental age) Does the patient have difficulty walking or climbing stairs?: Yes Weakness of Legs: None Weakness of Arms/Hands: Both  Permission Sought/Granted Permission sought to share information with : Case Manager Permission granted to share information with : Yes, Verbal Permission Granted  Share Information with NAME: Case Manager     Permission granted to share info w Relationship: Blayke Hlavka (dtr) [phone]     Emotional Assessment Appearance:: Appears stated age Attitude/Demeanor/Rapport: Gracious Affect (typically observed): Accepting Orientation: : Oriented to Self, Oriented to Place, Oriented to  Time, Oriented to Situation Alcohol / Substance Use: Not Applicable Psych Involvement: No (comment)  Admission diagnosis:  S/P closure of ileostomy [Z98.890] Patient Active Problem List   Diagnosis Date Noted   S/P closure of ileostomy 12/18/2022   Essential tremor 12/09/2022   Irritant contact dermatitis associated with fecal stoma 10/13/2022   Right inguinal hernia 09/05/2022   Insomnia 09/05/2022   Chronic pain 09/05/2022   S/P robot-assisted surgical procedure 09/04/2022   Ileostomy care (HCC) 09/04/2022   Rectal  cancer (HCC) 11/20/2021   Cognitive impairment 09/18/2021   Confusion 09/18/2021   Tremor 09/18/2021   Memory loss 08/24/2021   Tremor of both hands 08/24/2021   H/O rotator cuff surgery 08/24/2021   History of COVID-19 08/24/2021    Smoker 08/24/2021   [name], recurrent severe, without psychosis (HCC) 08/23/2021   Stiffness of right shoulder joint 02/09/2021   Pain in joint of right shoulder 02/09/2021   HNP (herniated nucleus pulposus), lumbar 01/15/2021   Sciatica 01/15/2021   Anxiety and depression 08/23/2009   PCP:  Rema Fendt, NP Pharmacy:   [id number] - [place], [place] - [address] [place] [address] Phone: [phone] Fax: [phone]  [facility] [place] - [address] [place] [address] Phone: [phone] Fax: [phone]     Social Determinants of Health (SDOH) Social History: SDOH Screenings   Food Insecurity: No Food Insecurity (12/22/2022)  Recent Concern: Food Insecurity - Food Insecurity Present (12/19/2022)  Housing: Patient Declined (12/22/2022)  Transportation Needs: Unmet Transportation Needs (12/22/2022)  Utilities: Not At Risk (12/19/2022)  Alcohol Screen: Low Risk  (08/23/2021)  Depression (PHQ2-9): High Risk (08/23/2021)  Financial Resource Strain: Medium Risk (01/15/2022)  Tobacco Use: High Risk (12/20/2022)   SDOH Interventions: Food Insecurity Interventions: Intervention Not Indicated, Inpatient TOC Housing Interventions: Intervention Not Indicated, Inpatient TOC Transportation Interventions: Inpatient TOC, Other (Comment) (resources placed in d/c instructions)   Readmission Risk Interventions     No data to display

## 2022-12-22 NOTE — Discharge Summary (Signed)
Physician Discharge Summary  Billy Roman ZOX:[id number] DOB: 05-Feb-1961 DOA: 12/18/2022  PCP: Rema Fendt, NP  Admit date: 12/18/2022 Discharge date:  12/22/2022   Recommendations for Outpatient Follow-up:   (include homehealth, outpatient follow-up instructions, specific recommendations for PCP to follow-up on, etc.)   Follow-up Information     Andria Meuse, MD Follow up on 01/20/2023.   Specialties: General Surgery, Colon and Rectal Surgery Why: Please arrive by 1:15 pm Contact information: [address] [place] [address] [phone]                Discharge Diagnoses:  Principal Problem:   S/P closure of ileostomy   Surgical Procedure: ileostomy reversal  Discharge Condition: Good Disposition: Home  Diet recommendation: reg diet   Hospital Course:  62 yo male with history of rectal cancer presented for loop ileostomy reversal. He did well and had return of bowel movement. He had moderate incisional pain and was discharged home POD 3.  Discharge Instructions   Allergies as of 12/22/2022       Reactions   Omnipaque [iohexol] Nausea And Vomiting   08/28/22- second time vomiting having scan with IV contrast Perfuse vomiting.    Propyphenazone Anaphylaxis, Itching, Rash   Not able to move        Medication List     STOP taking these medications    loperamide 2 MG capsule Commonly known as: IMODIUM       TAKE these medications    gabapentin 300 MG capsule Commonly known as: NEURONTIN Take 1 capsule (300 mg total) by mouth 2 (two) times daily as needed (neuropathy).   methocarbamol 500 MG tablet Commonly known as: ROBAXIN Take 500 mg by mouth every 8 (eight) hours as needed for muscle spasms.   OVER THE COUNTER MEDICATION Take 2 capsules by mouth daily. Honeysuckle extract   oxyCODONE 5 MG immediate release tablet Commonly known as: Roxicodone Take 1 tablet (5 mg total) by mouth every 6 (six) hours  as needed for up to 5 days (postop pain not controlled with tylenol and ibuprofen first).   prochlorperazine 10 MG tablet Commonly known as: COMPAZINE Take 1 tablet (10 mg total) by mouth every 6 (six) hours as needed for nausea or vomiting.   silver sulfADIAZINE 1 % cream Commonly known as: SILVADENE Apply 1 Application topically 2 (two) times daily.   traZODone 100 MG tablet Commonly known as: DESYREL Take 1 tablet (100 mg total) by mouth at bedtime as needed for sleep.        Follow-up Information     Andria Meuse, MD Follow up on 01/20/2023.   Specialties: General Surgery, Colon and Rectal Surgery Why: Please arrive by 1:15 pm Contact information: [address] [place] [address] [phone]                  The results of significant diagnostics from this hospitalization (including imaging, microbiology, ancillary and laboratory) are listed below for reference.    Significant Diagnostic Studies: No results found.  Labs: Basic Metabolic Panel: Recent Labs  Lab 12/19/22 0421 12/20/22 0433 12/22/22 0437  NA 138 136 135  K 3.7 3.6 3.5  CL 101 102 102  CO2 24 25 24   GLUCOSE 131* 101* 90  BUN 12 12 12   CREATININE 0.71 0.58* 0.58*  CALCIUM 9.0 8.5* 8.5*   Liver Function Tests: No results for input(s): "AST", "ALT", "ALKPHOS", "BILITOT", "PROT", "ALBUMIN" in the last 168  hours.  CBC: Recent Labs  Lab 12/19/22 0421 12/20/22 0433  WBC 8.2 8.0  HGB 14.9 13.4  HCT 45.6 40.6  MCV 91.2 92.3  PLT 238 203    CBG: No results for input(s): "GLUCAP" in the last 168 hours.  Principal Problem:   S/P closure of ileostomy   Time coordinating discharge: 15 min

## 2022-12-23 ENCOUNTER — Other Ambulatory Visit (HOSPITAL_COMMUNITY): Payer: Self-pay

## 2022-12-23 ENCOUNTER — Telehealth: Payer: Self-pay

## 2022-12-23 NOTE — Transitions of Care (Post Inpatient/ED Visit) (Signed)
   12/23/2022  Name: Billy Roman MRN: [id number] DOB: August 31, 1960  Transition of Care call successfully reached patient and he requested call back on 12/24/22. Plan to reach out to patient on 12/24/22.  Jodelle Gross, RN, BSN, CCM Care Management Coordinator [place] Phone: [phone]/Fax: [phone]

## 2022-12-24 ENCOUNTER — Telehealth: Payer: Self-pay

## 2022-12-24 NOTE — Transitions of Care (Post Inpatient/ED Visit) (Signed)
   12/24/2022  Name: Billy Roman MRN: [id number] DOB: 03/11/1961  Today's TOC FU Call Status: Today's TOC FU Call Status:: Successful TOC FU Call Competed TOC FU Call Complete Date: 12/24/22  Transition Care Management Follow-up Telephone Call Date of Discharge: 12/22/22 Discharge Facility: [facility]) Type of Discharge: Inpatient Admission Primary Inpatient Discharge Diagnosis:: Ileostomy Reversal How have you been since you were released from the hospital?: Better (Patient has been having loose stools, and is sore, but is somewhat better.) Any questions or concerns?: No  Items Reviewed: Did you receive and understand the discharge instructions provided?: Yes Medications obtained,verified, and reconciled?: Partial Review Completed Reason for Partial Mediation Review: Discussed discharge medication Any new allergies since your discharge?: No Dietary orders reviewed?: Yes Type of Diet Ordered:: soft diet, heart healthy Do you have support at home?: Yes People in Home: child(ren), adult Name of Support/Comfort Primary Source: Morrie Sheldon  Medications Reviewed Today: Medications Reviewed Today     Reviewed by Jodelle Gross, RN (Case Manager) on 12/24/22 at 1152  Med List Status: <None>   Medication Order Taking? Sig Documenting Provider Last Dose Status Informant  gabapentin (NEURONTIN) 300 MG capsule [id number] Yes Take 1 capsule (300 mg total) by mouth 2 (two) times daily as needed (neuropathy). Levert Feinstein, MD Taking Active Self  methocarbamol (ROBAXIN) 500 MG tablet [id number]  Take 500 mg by mouth every 8 (eight) hours as needed for muscle spasms. [provider]  Active Self  OVER THE COUNTER MEDICATION [id number]  Take 2 capsules by mouth daily. Honeysuckle extract [provider]  Active Self  oxyCODONE (ROXICODONE) 5 MG immediate release tablet [id number] No Take 1 tablet (5 mg total) by mouth every 6 (six) hours as needed for up to 5 days (postop pain  not controlled with tylenol and ibuprofen first).  Patient not taking: Reported on 12/24/2022   Kinsinger, De Blanch, MD Not Taking Active   prochlorperazine (COMPAZINE) 10 MG tablet [id number]  Take 1 tablet (10 mg total) by mouth every 6 (six) hours as needed for nausea or vomiting. Ladene Artist, MD  Active Self  silver sulfADIAZINE (SILVADENE) 1 % cream [id number]  Apply 1 Application topically 2 (two) times daily. [provider]  Active Self  traZODone (DESYREL) 100 MG tablet [id number]  Take 1 tablet (100 mg total) by mouth at bedtime as needed for sleep. Levert Feinstein, MD  Active Self            Home Care and Equipment/Supplies: Were Home Health Services Ordered?: No Any new equipment or medical supplies ordered?: No  Functional Questionnaire: Do you need assistance with bathing/showering or dressing?: No Do you need assistance with meal preparation?: No Do you need assistance with eating?: No Do you have difficulty maintaining continence: No Do you need assistance with getting out of bed/getting out of a chair/moving?: No Do you have difficulty managing or taking your medications?: No  Follow up appointments reviewed: PCP Follow-up appointment confirmed?: NA Specialist Hospital Follow-up appointment confirmed?: Yes Date of Specialist follow-up appointment?: 01/20/23 Follow-Up Specialty Provider:: Dr. Cliffton Asters Do you need transportation to your follow-up appointment?: No Do you understand care options if your condition(s) worsen?: Yes-patient verbalized understanding  SDOH Interventions Today    Flowsheet Row Most Recent Value  SDOH Interventions   Food Insecurity Interventions Intervention Not Indicated  Utilities Interventions Intervention Not Indicated      Jodelle Gross, RN, BSN, CCM Care Management Coordinator [facility] Phone: [phone]/Fax: [phone]

## 2022-12-26 NOTE — Anesthesia Postprocedure Evaluation (Signed)
Anesthesia Post Note  Patient: Billy Roman  Procedure(s) Performed: LOOP ILEOSTOMY REVERSAL     Patient location during evaluation: PACU Anesthesia Type: General Level of consciousness: awake and alert, oriented and patient cooperative Pain management: pain level controlled Vital Signs Assessment: post-procedure vital signs reviewed and stable Respiratory status: spontaneous breathing, nonlabored ventilation and respiratory function stable Cardiovascular status: blood pressure returned to baseline and stable Postop Assessment: no apparent nausea or vomiting Anesthetic complications: no  No notable events documented.  Last Vitals:  Vitals:   12/21/22 2103 12/22/22 0637  BP: 117/73 125/67  Pulse: 79 78  Resp: 18 18  Temp: 37.7 C 36.8 C  SpO2: 95% 95%    Last Pain:  Vitals:   12/22/22 1258  TempSrc:   PainSc: 3                  Lannie Fields

## 2022-12-27 ENCOUNTER — Inpatient Hospital Stay: Payer: 59 | Attending: Radiation Oncology

## 2022-12-27 ENCOUNTER — Encounter: Payer: Self-pay | Admitting: Nurse Practitioner

## 2022-12-27 ENCOUNTER — Telehealth: Payer: Self-pay | Admitting: *Deleted

## 2022-12-27 ENCOUNTER — Inpatient Hospital Stay (HOSPITAL_BASED_OUTPATIENT_CLINIC_OR_DEPARTMENT_OTHER): Payer: 59 | Admitting: Nurse Practitioner

## 2022-12-27 VITALS — BP 113/74 | HR 72 | Temp 98.2°F | Resp 18 | Ht 73.0 in | Wt 252.9 lb

## 2022-12-27 DIAGNOSIS — Z85048 Personal history of other malignant neoplasm of rectum, rectosigmoid junction, and anus: Secondary | ICD-10-CM | POA: Insufficient documentation

## 2022-12-27 DIAGNOSIS — Z08 Encounter for follow-up examination after completed treatment for malignant neoplasm: Secondary | ICD-10-CM | POA: Diagnosis not present

## 2022-12-27 DIAGNOSIS — C20 Malignant neoplasm of rectum: Secondary | ICD-10-CM | POA: Diagnosis not present

## 2022-12-27 LAB — CEA (ACCESS): CEA (CHCC): 3.52 ng/mL (ref 0.00–5.00)

## 2022-12-27 MED ORDER — PREDNISONE 50 MG PO TABS: ORAL_TABLET | ORAL | 0 refills | Status: DC

## 2022-12-27 NOTE — Telephone Encounter (Signed)
Called Billy Roman after speaking w/radiology about how to prep for his future CT scans. Will send detailed message to his MyChart and send in script for prednisone. He understands and agrees.

## 2022-12-27 NOTE — Progress Notes (Signed)
  [facility] OFFICE PROGRESS NOTE   Diagnosis: Rectal cancer  INTERVAL HISTORY:   Billy Roman returns as scheduled.  He underwent ileostomy reversal 12/18/2022.  Bowels are moving.  Some leakage of stool.  Mild periodic nausea.  Pain related to surgery.  He takes oxycodone if needed.  Appetite has been decreased.  He has mild tingling in the fingertips and feet.  Objective:  Vital signs in last 24 hours:  Blood pressure 113/74, pulse 72, temperature 98.2 F (36.8 C), temperature source Temporal, resp. rate 18, height 6\' 1"  (1.854 m), weight 252 lb 14.4 oz (114.7 kg), SpO2 98%.    Lymphatics: No palpable cervical, supraclavicular, axillary or inguinal lymph nodes. Resp: Lungs clear bilaterally. Cardio: Regular rate and rhythm. GI: No hepatosplenomegaly.  Right abdomen surgical incision appears to be healing, no surrounding erythema. Vascular: No leg edema. Port-A-Cath without erythema.  Lab Results:  Lab Results  Component Value Date   WBC 8.0 12/20/2022   HGB 13.4 12/20/2022   HCT 40.6 12/20/2022   MCV 92.3 12/20/2022   PLT 203 12/20/2022   NEUTROABS 2.1 08/27/2022    Imaging:  No results found.  Medications: I have reviewed the patient's current medications.  Assessment/Plan: Adenocarcinoma of the proximal rectum/distal sigmoid Colonoscopy 11/19/2021-partially obstructing mass at 12-19 cm, biopsy moderate to poorly differentiated adenocarcinoma, mismatch repair protein expression intact Mildly elevated CEA CTs 11/29/2021-mural thickening in the distal sigmoid/proximal rectum with haziness of the mesorectal fat, mesorectal lymphadenopathy, small pulmonary nodules favored to be benign MRI pelvis 12/03/2021-tumor at 10 cm from the anal verge, T3c, approximately 10 metastatic nodes in the mesorectal sheath and presacral space, no extra mesorectal lymphadenopathy, N2 Cycle 1 FOLFOX 12/19/2021 Cycle 2 FOLFOX 01/02/2022, home Decadron prophylaxis added for delayed  nausea Cycle 3 FOLFOX 01/15/2022 Cycle 4 FOLFOX 01/29/2022, 5-FU bolus, leucovorin, and oxaliplatin dose reduced secondary to neutropenia and thrombocytopenia Cycle 5 FOLFOX 02/12/2022, Udenyca Cycle 6 FOLFOX 02/26/2022, Udenyca Cycle 7 FOLFOX 03/12/2022, Udenyca  Cycle 8 FOLFOX 03/26/2022, oxaliplatin held due to neuropathy, Udenyca held Radiation/Xeloda 04/22/2022-06/03/2022 CTs 08/28/2022-persistent submucosal thickening through the distal sigmoid colon and rectum, no mass lesion evident, decrease in size of lymph nodes in the mesorectal sheath with remaining enlarged lymph nodes, no evidence of metastatic disease Low anterior resection/diverting loop ileostomy 09/04/2022-no residual tumor, treatment effect present with no viable cancer cells, ypT0, 0/13 nodes, negative resection margins 12/18/2022 loop ileostomy takedown 2.   Major depressive disorder Change in bowel habits and rectal bleeding secondary to #1 Ongoing tobacco use Report of blurred vision and diplopia Peripheral neuropathy-fingers and left foot  Disposition: Billy Roman appears stable.  He underwent ileostomy takedown 12/18/2022.  He continues to recover from surgery.  Plan for lab and CTs in the next 2 to 3 months.  We will see him in follow-up a few days after the CT scan.   Lonna Cobb ANP/GNP-BC   12/27/2022  1:47 PM

## 2022-12-31 ENCOUNTER — Other Ambulatory Visit: Payer: Self-pay

## 2023-01-08 ENCOUNTER — Encounter: Payer: Self-pay | Admitting: *Deleted

## 2023-01-08 NOTE — Progress Notes (Signed)
Received disability paper work from [employer] for continued disability. Called [phone] and informed them to send these to Dr. Marin Olp (surgeon) at request of Dr. Truett Perna. Provided phone/fax for [facility].

## 2023-01-11 ENCOUNTER — Other Ambulatory Visit: Payer: Self-pay | Admitting: Neurology

## 2023-01-30 ENCOUNTER — Ambulatory Visit: Payer: Self-pay

## 2023-01-30 NOTE — Telephone Encounter (Signed)
     Chief Complaint: Fatigue, depression since surgery in July. Daughter thinks pt. Is depressed because of his fatigue. No availability, asking to be worked in. Symptoms: Above Frequency: 2 months Pertinent Negatives: Patient denies  Disposition: [] ED /[] Urgent Care (no appt availability in office) / [] Appointment(In office/virtual)/ []  [facility]/ [] Home Care/ [] Refused Recommended Disposition /[] [facility]/ [x]  Follow-up with PCP Additional Notes: Please advise daughter of appointment.  Reason for Disposition  [1] Fatigue (i.e., tires easily, decreased energy) AND [2] persists > 1 week  Answer Assessment - Initial Assessment Questions 1. DESCRIPTION: "Describe how you are feeling."     Fatigue 2. SEVERITY: "How bad is it?"  "Can you stand and walk?"   - MILD (0-3): Feels weak or tired, but does not interfere with work, school or normal activities.   - MODERATE (4-7): Able to stand and walk; weakness interferes with work, school, or normal activities.   - SEVERE (8-10): Unable to stand or walk; unable to do usual activities.     Moderate 3. ONSET: "When did these symptoms begin?" (e.g., hours, days, weeks, months)     July 4. CAUSE: "What do you think is causing the weakness or fatigue?" (e.g., not drinking enough fluids, medical problem, trouble sleeping)     Recent surgery 5. NEW MEDICINES:  "Have you started on any new medicines recently?" (e.g., opioid pain medicines, benzodiazepines, muscle relaxants, antidepressants, antihistamines, neuroleptics, beta blockers)     No 6. OTHER SYMPTOMS: "Do you have any other symptoms?" (e.g., chest pain, fever, cough, SOB, vomiting, diarrhea, bleeding, other areas of pain)     Depression 7. PREGNANCY: "Is there any chance you are pregnant?" "When was your last menstrual period?"     N/a  Protocols used: Weakness (Generalized) and Fatigue-A-AH

## 2023-02-06 NOTE — Progress Notes (Signed)
ASSESSMENT AND PLAN 62 y.o. year old male   1.  Bilateral hand action tremor  Symmetric, denied family history, mostly posturing tremor, Dr.Yan felt consistent with essential tremor, also have frequent shoulder shrugging suggestive of motor tic  2.  Memory loss 3.  Depression 4.  Rectal cancer, underwent chemotherapy, status postresection, ostomy, ileostomy 5.  Insomnia 6.  Fatigue  Will pursue neuropsychological evaluation, given concerns of memory loss, fatigue, depression, insomnia  MRI of the brain with without contrast in March 2023 was normal  On vitamin D, B12 supplement, will recheck level including iron, TSH was normal  Can continue trazodone 100 mg at bedtime (will send 1 month supply at a time, seeing PCP next week)  Referral to psychiatry for better management of mood related issues, we discussed psychiatric crisis center going to the ER for significant mood disorder/suicidal ideation  Reports sleep study few years ago, negative for OSA, may consider retest in future  Seeing primary care next week   DIAGNOSTIC DATA (LABS, IMAGING, TESTING) - I reviewed patient records, labs, notes, testing and imaging myself where available.   HISTORY: Billy Roman is a 62 year old male, seen in request by his primary care physician nurse practitioner Zonia Kief, Amy for evaluation of bilateral hands tremor, initial evaluation was on September 18, 2021   I reviewed and summarized the referring note. PMHX. Depression Chronic insominia trazodone for sleep as needed,    Patient works as a Veterinary surgeon support, but recently change to a different job, which has caused a lot of stress on him, during the training in February 2023, he describes difficulty keeping up with the requirement,   This is concurrent with his worsening depression  He suffered depression for few years, especially since he separated from his wife in 2018, complains of worsening depression at the  beginning of 2023  Then he began to notice mild bilateral hands tremor, intermittent, also complains of memory loss, difficulty keeping up with his stroke,   UDS March 2023 was positive for marijuana, oxazepam  UPDATE December 09 2022: He finished radiation therapy in Dec 2023, underwent robotic assisted low anterior resection with diverting loop ileostomy in March 2024, recovering well,  He lives alone, complains of fatigue, poor sleep quality, taking trazodone as needed was helpful,  Laboratory evaluation in April RPR, HIV, CK, ANA, sed rate, CRP, MM panel, thyroid peroxidase antibody, thyroglobulin antibody, drug screen, ethanol were unremarkable with the exception of positive for marijuana  CT head 02/02/22 was unremarkable.  MRI of the brain with and without contrast March 2023 was unremarkable.  Update February 11, 2023 SS: Still intermittent tremors left arm, postural, present in the morning. Had ileostomy in July, since then poor energy. Taking trazodone 100 mg at bedtime. Is effective, but still wakes up during the night. Feels depressed, fatigued. Is taking B12 and Vitamin D. Talked with surgeon about fatigue, some expected post surgery. On disability now. Mentions sleep study few years ago, no OSA, didn't qualify for CPAP. Takes gabapentin 300 mg PRN for neuropathy is upper extremities, achy shoulders. His daughter is moving in with him.   REVIEW OF SYSTEMS: Out of a complete 14 system review of symptoms, the patient complains only of the following symptoms, and all other reviewed systems are negative.  See HPI  PHYSICAL EXAM  Vitals:   12/09/22 1300  BP: 119/76  Pulse: 73  Weight: 256 lb 8 oz (116.3 kg)  Height: 6\' 1"  (1.854 m)  Body mass index is 33.84 kg/m.    12/09/2022    1:04 PM 03/13/2022    8:22 AM 09/18/2021    2:40 PM  Montreal Cognitive Assessment   Visuospatial/ Executive (0/5) 3 4 5   Naming (0/3) 2 3 3   Attention: Read list of digits (0/2) 1 1 2   Attention: Read  list of letters (0/1) 0 1 1  Attention: Serial 7 subtraction starting at 100 (0/3) 0 0 3  Language: Repeat phrase (0/2) 1 2 1   Language : Fluency (0/1) 0 0 0  Abstraction (0/2) 2 2 2   Delayed Recall (0/5) 2 0 2  Orientation (0/6) 5 4 5   Total 16 17 24    Generalized: Well developed, in no acute distress, depressed, frequent left shoulder shrugging Neurological examination  Mentation: Alert, oriented, some trouble focusing, able to follow exam commands,   Cranial nerve II-XII: Pupils were equal round reactive to light. Extraocular movements were full, visual field were full on confrontational test. Facial sensation and strength were normal. Head turning and shoulder shrug were normal and symmetric, but hesitant to lift the left shoulder. Motor: Normal strength, mild bilateral hand posturing tremor, no bradykinesia no rigidity, frequent left shoulder shrugging Sensory: Sensory testing is intact to soft touch on all 4 extremities. No evidence of extinction is noted.  Coordination: Cerebellar testing reveals good finger-nose-finger and heel-to-shin bilaterally.  No tremor noted with finger-nose-finger.  Hard to perform heel-to-shin. Gait and station: Push-up to get up from seated position, steady, independent Reflexes: Deep tendon reflexes are symmetric and normal bilaterally.   ALLERGIES: Allergies  Allergen Reactions   Omnipaque [Iohexol] Nausea And Vomiting    08/28/22- second time vomiting having scan with IV contrast Perfuse vomiting.    Propyphenazone Anaphylaxis, Itching and Rash    Not able to move    HOME MEDICATIONS: Outpatient Medications Prior to Visit  Medication Sig Dispense Refill   gabapentin (NEURONTIN) 300 MG capsule Take 1 capsule (300 mg total) by mouth 2 (two) times daily as needed (neuropathy). 60 capsule 11   methocarbamol (ROBAXIN) 500 MG tablet Take 500 mg by mouth every 8 (eight) hours as needed for muscle spasms.     OVER THE COUNTER MEDICATION Take 2 capsules  by mouth daily. Honeysuckle extract     predniSONE (DELTASONE) 50 MG tablet Take 50 mg 13 hours, 7 hours and 1 hour prior to CT scan. 3 tablet 0   prochlorperazine (COMPAZINE) 10 MG tablet Take 1 tablet (10 mg total) by mouth every 6 (six) hours as needed for nausea or vomiting. 30 tablet 0   silver sulfADIAZINE (SILVADENE) 1 % cream Apply 1 Application topically 2 (two) times daily.     traZODone (DESYREL) 100 MG tablet TAKE 1 TABLET BY MOUTH AT BEDTIME AS NEEDED FOR SLEEP. 90 tablet 4   No facility-administered medications prior to visit.    PAST MEDICAL HISTORY: Past Medical History:  Diagnosis Date   Anxiety    Arthritis    Complication of anesthesia    Nausea   COPD (chronic obstructive pulmonary disease) (HCC)    Dyspnea    Dysrhythmia    RBBB   [name], recurrent severe, without psychosis (HCC) 08/23/2021   Pneumonia    Rectal cancer (HCC) 11/20/2021   Sleep apnea    PAST SURGICAL HISTORY: Past Surgical History:  Procedure Laterality Date   BIOPSY  10/17/2022   Procedure: BIOPSY;  Surgeon: Andria Meuse, MD;  Location: [facility];  Service: General;;  DIVERTING ILEOSTOMY N/A 09/04/2022   Procedure: LOOP ILEOSTOMY;  Surgeon: Andria Meuse, MD;  Location: [facility];  Service: General;  Laterality: N/A;   FLEXIBLE SIGMOIDOSCOPY N/A 09/04/2022   Procedure: FLEXIBLE SIGMOIDOSCOPY;  Surgeon: Andria Meuse, MD;  Location: [facility];  Service: General;  Laterality: N/A;   FLEXIBLE SIGMOIDOSCOPY N/A 10/17/2022   Procedure: FLEXIBLE SIGMOIDOSCOPY;  Surgeon: Andria Meuse, MD;  Location: [facility];  Service: General;  Laterality: N/A;   ILEOSTOMY CLOSURE N/A 12/18/2022   Procedure: LOOP ILEOSTOMY REVERSAL;  Surgeon: Andria Meuse, MD;  Location: [facility];  Service: General;  Laterality: N/A;   OPERATIVE ULTRASOUND Right 12/17/2021   Procedure: OPERATIVE ULTRASOUND;  Surgeon: Andria Meuse, MD;  Location: [facility] OR;  Service:  General;  Laterality: Right;   POLYPECTOMY  10/17/2022   Procedure: POLYPECTOMY;  Surgeon: Andria Meuse, MD;  Location: [facility];  Service: General;;   PORT-A-CATH REMOVAL N/A 05/20/2022   Procedure: REMOVAL PORT-A-CATH;  Surgeon: Andria Meuse, MD;  [facility];  Service: General;  Laterality: N/A;   PORTACATH PLACEMENT Right 12/17/2021   Procedure: INSERTION PORT-A-CATH;  Surgeon: Andria Meuse, MD;  Location: [facility] OR;  Service: General;  Laterality: Right;   RHINOPLASTY     SEPTOPLASTY     SHOULDER ARTHROSCOPY Left    SHOULDER ARTHROSCOPY WITH OPEN ROTATOR CUFF REPAIR AND DISTAL CLAVICLE ACROMINECTOMY Right 02/01/2021   Procedure: RIGHT SHOULDER ARTHROSCOPY WITH MINI OPEN ROTATOR CUFF REPAIR AND DISTAL CLAVICLE EXCISION;  Surgeon: Juanell Fairly, MD;  Location: [facility];  Service: Orthopedics;  Laterality: Right;   SPINE SURGERY     lumbar laminectomy   WRIST SURGERY Bilateral    corpal tunnel   XI ROBOTIC ASSISTED LOWER ANTERIOR RESECTION N/A 09/04/2022   Procedure: ROBOTIC LOW ANTERIOR RESECTION, BILATERAL TAP BLOCK, AND INOPERATIVE ASSESSMENT OF PERFUSION USING FIREFLY DYE;  Surgeon: Andria Meuse, MD;  Location: [facility];  Service: General;  Laterality: N/A;    FAMILY HISTORY: Family History  Adopted: Yes  Problem Relation Age of Onset   Colon cancer Neg Hx    Stomach cancer Neg Hx    Esophageal cancer Neg Hx     SOCIAL HISTORY: Social History   Socioeconomic History   Marital status: Single    Spouse name: Not on file   Number of children: 2   Years of education: Not on file   Highest education level: Not on file  Occupational History   Occupation: tech support  Tobacco Use   Smoking status: Every Day    Current packs/day: 0.00    Average packs/day: 0.3 packs/day for 30.0 years (7.5 ttl pk-yrs)    Types: Cigarettes    Start date: 11/22/1990    Last attempt to quit: 11/21/2020    Years since quitting: 2.2    Passive  exposure: Never   Smokeless tobacco: Former    Types: Chew    Quit date: 1990  Vaping Use   Vaping status: Never Used  Substance and Sexual Activity   Alcohol use: Yes    Comment: rare   Drug use: Yes    Frequency: 1.0 times per week    Types: Marijuana    Comment: Gummies   Sexual activity: Not Currently  Other Topics Concern   Not on file  Social History Narrative   Divorced, 2 daughters   Works in [employer] for [employer]   1 alcoholic drink a day smokes cigarettes, uses marijuana, 0-1 caffeinated beverages  daily   Social Determinants of Health   Financial Resource Strain: Medium Risk (01/15/2022)   Overall Financial Resource Strain (CARDIA)    Difficulty of Paying Living Expenses: Somewhat hard  Food Insecurity: No Food Insecurity (12/24/2022)   Hunger Vital Sign    Worried About Running Out of Food in the Last Year: Never true    Ran Out of Food in the Last Year: Never true  Recent Concern: Food Insecurity - Food Insecurity Present (12/19/2022)   Hunger Vital Sign    Worried About Running Out of Food in the Last Year: Sometimes true    Ran Out of Food in the Last Year: Sometimes true  Transportation Needs: Unmet Transportation Needs (12/22/2022)   PRAPARE - Administrator, Civil Service (Medical): Yes    Lack of Transportation (Non-Medical): Yes  Physical Activity: Not on file  Stress: Not on file  Social Connections: Not on file  Intimate Partner Violence: Not At Risk (12/19/2022)   Humiliation, Afraid, Rape, and Kick questionnaire    Fear of Current or Ex-Partner: No    Emotionally Abused: No    Physically Abused: No    Sexually Abused: No   Margie Ege, Edrick Oh, DNP  [facility] [address] [place], [place] [address] [phone]

## 2023-02-11 ENCOUNTER — Telehealth: Payer: Self-pay | Admitting: Neurology

## 2023-02-11 ENCOUNTER — Ambulatory Visit (INDEPENDENT_AMBULATORY_CARE_PROVIDER_SITE_OTHER): Payer: 59 | Admitting: Neurology

## 2023-02-11 ENCOUNTER — Encounter: Payer: Self-pay | Admitting: Neurology

## 2023-02-11 VITALS — BP 117/70 | HR 70 | Ht 73.0 in | Wt 247.2 lb

## 2023-02-11 DIAGNOSIS — R4189 Other symptoms and signs involving cognitive functions and awareness: Secondary | ICD-10-CM

## 2023-02-11 DIAGNOSIS — R413 Other amnesia: Secondary | ICD-10-CM

## 2023-02-11 DIAGNOSIS — F419 Anxiety disorder, unspecified: Secondary | ICD-10-CM | POA: Diagnosis not present

## 2023-02-11 DIAGNOSIS — C20 Malignant neoplasm of rectum: Secondary | ICD-10-CM

## 2023-02-11 DIAGNOSIS — R251 Tremor, unspecified: Secondary | ICD-10-CM

## 2023-02-11 DIAGNOSIS — F32A Depression, unspecified: Secondary | ICD-10-CM | POA: Diagnosis not present

## 2023-02-11 MED ORDER — TRAZODONE HCL 100 MG PO TABS: 100.0000 mg | ORAL_TABLET | Freq: Every evening | ORAL | 5 refills | Status: DC | PRN

## 2023-02-11 NOTE — Addendum Note (Signed)
Addended by: Levert Feinstein on: 02/11/2023 02:20 PM   Modules accepted: Orders

## 2023-02-11 NOTE — Telephone Encounter (Signed)
Pattricia Boss, can you help me check on status of referral for neuropsych evaluation Dr. Terrace Arabia ordered in June 2024. This is still needed.

## 2023-02-11 NOTE — Progress Notes (Signed)
Chart reviewed, agree above plan ?

## 2023-02-11 NOTE — Telephone Encounter (Signed)
Orders Placed This Encounter  Procedures   Ambulatory referral to Neuropsychology

## 2023-02-11 NOTE — Patient Instructions (Addendum)
Recheck labs today  Will follow up on neuropsychological evaluation  Keep appointment with primary care next week  Referral to psychiatry  For any acute psychiatric change or any acute suicidal or homicidal ideation, recommend going to the ER for behavioral health crisis center Will reorder trazodone 100 mg at bedtime for sleep

## 2023-02-12 ENCOUNTER — Other Ambulatory Visit: Payer: Self-pay

## 2023-02-12 LAB — IRON,TIBC AND FERRITIN PANEL
Ferritin: 84 ng/mL (ref 30–400)
Iron Saturation: 23 % (ref 15–55)
Iron: 90 ug/dL (ref 38–169)
Total Iron Binding Capacity: 393 ug/dL (ref 250–450)
UIBC: 303 ug/dL (ref 111–343)

## 2023-02-12 LAB — VITAMIN D 25 HYDROXY (VIT D DEFICIENCY, FRACTURES): Vit D, 25-Hydroxy: 22.6 ng/mL — ABNORMAL LOW (ref 30.0–100.0)

## 2023-02-12 LAB — VITAMIN B12: Vitamin B-12: 589 pg/mL (ref 232–1245)

## 2023-02-13 ENCOUNTER — Telehealth: Payer: Self-pay | Admitting: Neurology

## 2023-02-13 NOTE — Telephone Encounter (Signed)
Referral for neuropsychology fax to [facility]. Phone:[phone], Fax: [phone]

## 2023-02-18 ENCOUNTER — Encounter: Payer: Self-pay | Admitting: Family

## 2023-02-18 ENCOUNTER — Ambulatory Visit (INDEPENDENT_AMBULATORY_CARE_PROVIDER_SITE_OTHER): Payer: 59 | Admitting: Family

## 2023-02-18 VITALS — BP 123/80 | HR 83 | Temp 98.0°F | Ht 73.0 in | Wt 245.4 lb

## 2023-02-18 DIAGNOSIS — E1110 Type 2 diabetes mellitus with ketoacidosis without coma: Secondary | ICD-10-CM | POA: Diagnosis not present

## 2023-02-18 DIAGNOSIS — R5383 Other fatigue: Secondary | ICD-10-CM | POA: Diagnosis not present

## 2023-02-18 DIAGNOSIS — F419 Anxiety disorder, unspecified: Secondary | ICD-10-CM

## 2023-02-18 DIAGNOSIS — F32A Depression, unspecified: Secondary | ICD-10-CM

## 2023-02-18 DIAGNOSIS — Z7984 Long term (current) use of oral hypoglycemic drugs: Secondary | ICD-10-CM | POA: Diagnosis not present

## 2023-02-18 NOTE — Progress Notes (Signed)
Pt wants to speak about depression and fatigue.

## 2023-02-18 NOTE — Progress Notes (Signed)
Patient ID: Billy Roman, male    DOB: 1961/01/01  MRN: [id number]  CC: Follow-up   Subjective: Billy Roman is a 62 y.o. male who presents for follow-up.   His concerns today include:  - Reports fatigue persisting. History of colon cancer. Reports he is established with Oncology, Gastroenterology, General Surgery, and Neurology. States "all labs" have been checked and specialists cannot "find anything" except for Vitamin D deficiency. Reports he is taking Vitamin D supplement prescribed by Neurology. I discussed with patient in detail to schedule appointments with specialists soon due to fatigue may be related to cancer history. Patient verbalized agreement/understanding. - Anxiety depression.He denies thoughts of self-harm, suicidal ideations, homicidal ideations. Requests new referral to Psychiatry due to states former psychiatrist was charging "$400 for 30 minutes". - No further issues/concerns for discussion today.   Patient Active Problem List   Diagnosis Date Noted   S/P closure of ileostomy 12/18/2022   Essential tremor 12/09/2022   Irritant contact dermatitis associated with fecal stoma 10/13/2022   Right inguinal hernia 09/05/2022   Insomnia 09/05/2022   Chronic pain 09/05/2022   S/P robot-assisted surgical procedure 09/04/2022   Ileostomy care (HCC) 09/04/2022   Rectal cancer (HCC) 11/20/2021   Cognitive impairment 09/18/2021   Confusion 09/18/2021   Tremor 09/18/2021   Memory loss 08/24/2021   Tremor of both hands 08/24/2021   H/O rotator cuff surgery 08/24/2021   History of COVID-19 08/24/2021   Smoker 08/24/2021   [name], recurrent severe, without psychosis (HCC) 08/23/2021   Stiffness of right shoulder joint 02/09/2021   Pain in joint of right shoulder 02/09/2021   HNP (herniated nucleus pulposus), lumbar 01/15/2021   Sciatica 01/15/2021   Anxiety and depression 08/23/2009     Current Outpatient Medications on File Prior  to Visit  Medication Sig Dispense Refill   gabapentin (NEURONTIN) 300 MG capsule Take 1 capsule (300 mg total) by mouth 2 (two) times daily as needed (neuropathy). 60 capsule 11   methocarbamol (ROBAXIN) 500 MG tablet Take 500 mg by mouth every 8 (eight) hours as needed for muscle spasms.     OVER THE COUNTER MEDICATION Take 2 capsules by mouth daily. Honeysuckle extract     predniSONE (DELTASONE) 50 MG tablet Take 50 mg 13 hours, 7 hours and 1 hour prior to CT scan. 3 tablet 0   prochlorperazine (COMPAZINE) 10 MG tablet Take 1 tablet (10 mg total) by mouth every 6 (six) hours as needed for nausea or vomiting. 30 tablet 0   traZODone (DESYREL) 100 MG tablet Take 1 tablet (100 mg total) by mouth at bedtime as needed. for sleep 30 tablet 5   silver sulfADIAZINE (SILVADENE) 1 % cream Apply 1 Application topically 2 (two) times daily. (Patient not taking: Reported on 02/18/2023)     No current facility-administered medications on file prior to visit.    Allergies  Allergen Reactions   Omnipaque [Iohexol] Nausea And Vomiting    08/28/22- second time vomiting having scan with IV contrast Perfuse vomiting.    Propyphenazone Anaphylaxis, Itching and Rash    Not able to move    Social History   Socioeconomic History   Marital status: Single    Spouse name: Not on file   Number of children: 2   Years of education: Not on file   Highest education level: Not on file  Occupational History   Occupation: tech support  Tobacco Use   Smoking status: Every Day    Current  packs/day: 0.00    Average packs/day: 0.3 packs/day for 30.0 years (7.5 ttl pk-yrs)    Types: Cigarettes    Start date: 11/22/1990    Last attempt to quit: 11/21/2020    Years since quitting: 2.2    Passive exposure: Never   Smokeless tobacco: Former    Types: Chew    Quit date: 1990  Vaping Use   Vaping status: Never Used  Substance and Sexual Activity   Alcohol use: Yes    Comment: rare   Drug use: Yes    Frequency: 1.0  times per week    Types: Marijuana    Comment: Gummies   Sexual activity: Not Currently  Other Topics Concern   Not on file  Social History Narrative   Divorced, 2 daughters   Works in [employer] for [employer]   1 alcoholic drink a day smokes cigarettes, uses marijuana, 0-1 caffeinated beverages daily   Social Determinants of Health   Financial Resource Strain: Medium Risk (01/15/2022)   Overall Financial Resource Strain (CARDIA)    Difficulty of Paying Living Expenses: Somewhat hard  Food Insecurity: No Food Insecurity (12/24/2022)   Hunger Vital Sign    Worried About Running Out of Food in the Last Year: Never true    Ran Out of Food in the Last Year: Never true  Recent Concern: Food Insecurity - Food Insecurity Present (12/19/2022)   Hunger Vital Sign    Worried About Running Out of Food in the Last Year: Sometimes true    Ran Out of Food in the Last Year: Sometimes true  Transportation Needs: Unmet Transportation Needs (12/22/2022)   PRAPARE - Administrator, Civil Service (Medical): Yes    Lack of Transportation (Non-Medical): Yes  Physical Activity: Not on file  Stress: Not on file  Social Connections: Not on file  Intimate Partner Violence: Not At Risk (12/19/2022)   Humiliation, Afraid, Rape, and Kick questionnaire    Fear of Current or Ex-Partner: No    Emotionally Abused: No    Physically Abused: No    Sexually Abused: No    Family History  Adopted: Yes  Problem Relation Age of Onset   Colon cancer Neg Hx    Stomach cancer Neg Hx    Esophageal cancer Neg Hx     Past Surgical History:  Procedure Laterality Date   BIOPSY  10/17/2022   Procedure: BIOPSY;  Surgeon: Andria Meuse, MD;  Location: [facility];  Service: General;;   DIVERTING ILEOSTOMY N/A 09/04/2022   Procedure: LOOP ILEOSTOMY;  Surgeon: Andria Meuse, MD;  Location: [facility];  Service: General;  Laterality: N/A;   FLEXIBLE SIGMOIDOSCOPY N/A 09/04/2022   Procedure: FLEXIBLE  SIGMOIDOSCOPY;  Surgeon: Andria Meuse, MD;  Location: [facility];  Service: General;  Laterality: N/A;   FLEXIBLE SIGMOIDOSCOPY N/A 10/17/2022   Procedure: FLEXIBLE SIGMOIDOSCOPY;  Surgeon: Andria Meuse, MD;  Location: [facility];  Service: General;  Laterality: N/A;   ILEOSTOMY CLOSURE N/A 12/18/2022   Procedure: LOOP ILEOSTOMY REVERSAL;  Surgeon: Andria Meuse, MD;  Location: [facility];  Service: General;  Laterality: N/A;   OPERATIVE ULTRASOUND Right 12/17/2021   Procedure: OPERATIVE ULTRASOUND;  Surgeon: Andria Meuse, MD;  Location: [facility] OR;  Service: General;  Laterality: Right;   POLYPECTOMY  10/17/2022   Procedure: POLYPECTOMY;  Surgeon: Andria Meuse, MD;  Location: [facility];  Service: General;;   PORT-A-CATH REMOVAL N/A 05/20/2022   Procedure: REMOVAL PORT-A-CATH;  Surgeon: Cliffton Asters,  Stephanie Coup, MD;  Location: [facility];  Service: General;  Laterality: N/A;   PORTACATH PLACEMENT Right 12/17/2021   Procedure: INSERTION PORT-A-CATH;  Surgeon: Andria Meuse, MD;  Location: [facility];  Service: General;  Laterality: Right;   RHINOPLASTY     SEPTOPLASTY     SHOULDER ARTHROSCOPY Left    SHOULDER ARTHROSCOPY WITH OPEN ROTATOR CUFF REPAIR AND DISTAL CLAVICLE ACROMINECTOMY Right 02/01/2021   Procedure: RIGHT SHOULDER ARTHROSCOPY WITH MINI OPEN ROTATOR CUFF REPAIR AND DISTAL CLAVICLE EXCISION;  Surgeon: Juanell Fairly, MD;  Location: [facility];  Service: Orthopedics;  Laterality: Right;   SPINE SURGERY     lumbar laminectomy   WRIST SURGERY Bilateral    corpal tunnel   XI ROBOTIC ASSISTED LOWER ANTERIOR RESECTION N/A 09/04/2022   Procedure: ROBOTIC LOW ANTERIOR RESECTION, BILATERAL TAP BLOCK, AND INOPERATIVE ASSESSMENT OF PERFUSION USING FIREFLY DYE;  Surgeon: Andria Meuse, MD;  Location: [facility];  Service: General;  Laterality: N/A;    ROS: Review of Systems Negative except as stated above  PHYSICAL EXAM: BP 123/80   Pulse 83   Temp  98 F (36.7 C) (Oral)   Ht 6\' 1"  (1.854 m)   Wt 245 lb 6.4 oz (111.3 kg)   SpO2 95%   BMI 32.38 kg/m   Physical Exam HENT:     Head: Normocephalic and atraumatic.     Nose: Nose normal.     Mouth/Throat:     Mouth: Mucous membranes are moist.     Pharynx: Oropharynx is clear.  Eyes:     Extraocular Movements: Extraocular movements intact.     Conjunctiva/sclera: Conjunctivae normal.     Pupils: Pupils are equal, round, and reactive to light.  Cardiovascular:     Rate and Rhythm: Normal rate and regular rhythm.     Pulses: Normal pulses.     Heart sounds: Normal heart sounds.  Pulmonary:     Effort: Pulmonary effort is normal.     Breath sounds: Normal breath sounds.  Musculoskeletal:        General: Normal range of motion.     Cervical back: Normal range of motion and neck supple.  Neurological:     General: No focal deficit present.     Mental Status: He is alert and oriented to person, place, and time.  Psychiatric:        Mood and Affect: Mood normal.        Behavior: Behavior normal.     ASSESSMENT AND PLAN: 1. Fatigue, unspecified type - Routine screening.  - Keep all scheduled appointments with specialists.  - POCT glycosylated hemoglobin (Hb A1C); Future  2. Anxiety and depression - Patient denies thoughts of self-harm, suicidal ideations, homicidal ideations. - Referral to Psychiatry for further evaluation/management.  - Ambulatory referral to Psychiatry   Patient was given the opportunity to ask questions.  Patient verbalized understanding of the plan and was able to repeat key elements of the plan. Patient was given clear instructions to go to Emergency Department or return to medical center if symptoms don't improve, worsen, or new problems develop.The patient verbalized understanding.   Orders Placed This Encounter  Procedures   Ambulatory referral to Psychiatry   POCT glycosylated hemoglobin (Hb A1C)    Follow-up with primary provider as  scheduled.  Rema Fendt, NP

## 2023-02-18 NOTE — Addendum Note (Signed)
Addended by: Claudie Leach on: 02/18/2023 03:27 PM   Modules accepted: Orders

## 2023-02-19 ENCOUNTER — Encounter: Payer: Self-pay | Admitting: Family

## 2023-02-19 ENCOUNTER — Telehealth: Payer: Self-pay | Admitting: Licensed Clinical Social Worker

## 2023-02-19 DIAGNOSIS — R7303 Prediabetes: Secondary | ICD-10-CM | POA: Insufficient documentation

## 2023-02-19 LAB — HEMOGLOBIN A1C
Est. average glucose Bld gHb Est-mCnc: 117 mg/dL
Hgb A1c MFr Bld: 5.7 % — ABNORMAL HIGH (ref 4.8–5.6)

## 2023-02-19 NOTE — Telephone Encounter (Signed)
Received

## 2023-02-19 NOTE — Telephone Encounter (Signed)
LCSWA called patient today to introduce herself and to assess patients' mental health needs. Patient did not answer the phone. LCSWA was able to leave a brief message with the patient asking them to return the call. Patient was referred by PCP for anxiety and depression.   

## 2023-03-03 ENCOUNTER — Telehealth: Payer: Self-pay | Admitting: *Deleted

## 2023-03-03 NOTE — Telephone Encounter (Signed)
Billy Roman left VM requesting nurse call him in regards to needing some records. Attempted to return call and left VM that RN will call back tomorrow.

## 2023-03-04 ENCOUNTER — Encounter: Payer: Self-pay | Admitting: *Deleted

## 2023-03-05 ENCOUNTER — Encounter: Payer: Self-pay | Admitting: *Deleted

## 2023-03-28 ENCOUNTER — Other Ambulatory Visit: Payer: Self-pay

## 2023-03-28 DIAGNOSIS — C20 Malignant neoplasm of rectum: Secondary | ICD-10-CM

## 2023-03-28 MED ORDER — PREDNISONE 50 MG PO TABS
ORAL_TABLET | ORAL | 0 refills | Status: DC
Start: 2023-03-28 — End: 2023-09-01

## 2023-04-01 ENCOUNTER — Encounter (HOSPITAL_BASED_OUTPATIENT_CLINIC_OR_DEPARTMENT_OTHER): Payer: Self-pay

## 2023-04-01 ENCOUNTER — Ambulatory Visit (HOSPITAL_BASED_OUTPATIENT_CLINIC_OR_DEPARTMENT_OTHER)
Admission: RE | Admit: 2023-04-01 | Discharge: 2023-04-01 | Disposition: A | Discharge status: Home / Self Care | Payer: 59 | Source: Ambulatory Visit | Attending: Nurse Practitioner | Admitting: Nurse Practitioner

## 2023-04-01 ENCOUNTER — Inpatient Hospital Stay: Payer: 59 | Attending: Radiation Oncology

## 2023-04-01 DIAGNOSIS — K5730 Diverticulosis of large intestine without perforation or abscess without bleeding: Secondary | ICD-10-CM | POA: Diagnosis not present

## 2023-04-01 DIAGNOSIS — Z85048 Personal history of other malignant neoplasm of rectum, rectosigmoid junction, and anus: Secondary | ICD-10-CM | POA: Diagnosis not present

## 2023-04-01 DIAGNOSIS — C20 Malignant neoplasm of rectum: Secondary | ICD-10-CM | POA: Diagnosis not present

## 2023-04-01 DIAGNOSIS — R918 Other nonspecific abnormal finding of lung field: Secondary | ICD-10-CM | POA: Diagnosis not present

## 2023-04-01 LAB — BASIC METABOLIC PANEL - CANCER CENTER ONLY
Anion gap: 9 (ref 5–15)
BUN: 16 mg/dL (ref 6–20)
CO2: 26 mmol/L (ref 22–32)
Calcium: 9.9 mg/dL (ref 8.9–10.3)
Chloride: 103 mmol/L (ref 98–111)
Creatinine: 0.71 mg/dL (ref 0.61–1.24)
GFR, Estimated: >60 mL/min (ref >60)
Glucose, Bld: 131 mg/dL — ABNORMAL HIGH (ref 70–99)
Potassium: 4.1 mmol/L (ref 3.5–5.1)
Sodium: 138 mmol/L (ref 135–145)

## 2023-04-01 LAB — CEA (ACCESS): CEA (CHCC): 2.88 ng/mL (ref 0.00–5.00)

## 2023-04-01 MED ORDER — IOHEXOL 300 MG/ML  SOLN
100.0000 mL | Freq: Once | INTRAMUSCULAR | Status: AC | PRN
  Administered 2023-04-01: 100 mL via INTRAVENOUS

## 2023-04-02 ENCOUNTER — Telehealth: Payer: Self-pay | Admitting: *Deleted

## 2023-04-02 NOTE — Telephone Encounter (Signed)
Called to ask if OK for him to have dental extraction next week in regards to his cancer and blood counts. Confirmed with him OK to have dental extractions in regards to his rectal cancer in remission.

## 2023-04-04 ENCOUNTER — Ambulatory Visit: Payer: 59 | Admitting: Oncology

## 2023-04-04 ENCOUNTER — Telehealth: Payer: Self-pay

## 2023-04-04 NOTE — Telephone Encounter (Signed)
-----   Message from [name] sent at 04/04/2023  1:00 PM EDT ----- Please call patient, CTs  show no evidence of recurrent cancer, follow-up as scheduled

## 2023-04-04 NOTE — Telephone Encounter (Signed)
Patient gave verbal understanding and had no further question or concern. 

## 2023-04-08 ENCOUNTER — Encounter: Payer: Self-pay | Admitting: Oncology

## 2023-04-08 ENCOUNTER — Telehealth: Payer: Self-pay | Admitting: Internal Medicine

## 2023-04-08 ENCOUNTER — Inpatient Hospital Stay: Payer: 59 | Admitting: Oncology

## 2023-04-08 ENCOUNTER — Inpatient Hospital Stay: Payer: 59

## 2023-04-08 VITALS — BP 127/76 | HR 81 | Temp 97.9°F | Resp 18 | Ht 73.0 in | Wt 249.2 lb

## 2023-04-08 DIAGNOSIS — Z23 Encounter for immunization: Secondary | ICD-10-CM | POA: Insufficient documentation

## 2023-04-08 DIAGNOSIS — Z860100 Personal history of colon polyps, unspecified: Secondary | ICD-10-CM

## 2023-04-08 DIAGNOSIS — Z85048 Personal history of other malignant neoplasm of rectum, rectosigmoid junction, and anus: Secondary | ICD-10-CM

## 2023-04-08 DIAGNOSIS — C20 Malignant neoplasm of rectum: Secondary | ICD-10-CM

## 2023-04-08 MED ORDER — INFLUENZA VIRUS VACC SPLIT PF (FLUZONE) 0.5 ML IM SUSY
0.5000 mL | PREFILLED_SYRINGE | Freq: Once | INTRAMUSCULAR | Status: AC
  Administered 2023-04-08: 0.5 mL via INTRAMUSCULAR
  Filled 2023-04-08: qty 0.5

## 2023-04-08 NOTE — Telephone Encounter (Signed)
Received message from Dr. Truett Perna about arranging a repeat colonoscopy for this patient with history of rectal cancer.  Please contact him and arrange a direct colonoscopy.  Encounter Diagnoses  Name Primary?   History of rectal cancer Yes   History of colonic polyps

## 2023-04-08 NOTE — Progress Notes (Signed)
[facility] OFFICE PROGRESS NOTE   Diagnosis: Rectal cancer  INTERVAL HISTORY:   Billy Roman returns as scheduled.  He continues to have neuropathy symptoms.  He recently had a "black "stool.  He reports a cough and wheezing.  He was prescribed prednisone and an inhaler by his primary provider.  The cough persist.  He is smoking approximately 10 cigarettes/day.  Objective:  Vital signs in last 24 hours:  Blood pressure 127/76, pulse 81, temperature 97.9 F (36.6 C), temperature source Oral, resp. rate 18, height 6\' 1"  (1.854 m), weight 249 lb 3.2 oz (113 kg), SpO2 99%.    HEENT: Oropharynx without visible mass, slight prominence of the left compared to the right submandibular gland Lymphatics: No cervical, supraclavicular, axillary, or inguinal nodes Resp: Distant breath sounds, no respiratory distress Cardio: Distant heart sounds, regular rate and rhythm GI: No mass, no hepatosplenomegaly Vascular: No leg edema   Lab Results:  Lab Results  Component Value Date   WBC 8.0 12/20/2022   HGB 13.4 12/20/2022   HCT 40.6 12/20/2022   MCV 92.3 12/20/2022   PLT 203 12/20/2022   NEUTROABS 2.1 08/27/2022    CMP  Lab Results  Component Value Date   NA 138 04/01/2023   K 4.1 04/01/2023   CL 103 04/01/2023   CO2 26 04/01/2023   GLUCOSE 131 (H) 04/01/2023   BUN 16 04/01/2023   CREATININE 0.71 04/01/2023   CALCIUM 9.9 04/01/2023   PROT 7.4 08/27/2022   ALBUMIN 4.1 08/27/2022   AST 32 08/27/2022   ALT 32 08/27/2022   ALKPHOS 55 08/27/2022   BILITOT 0.6 08/27/2022   GFRNONAA >60 04/01/2023   GFRAA 117 07/26/2019    Lab Results  Component Value Date   CEA 2.88 04/01/2023   Medications: I have reviewed the patient's current medications.   Assessment/Plan: Adenocarcinoma of the proximal rectum/distal sigmoid Colonoscopy 11/19/2021-partially obstructing mass at 12-19 cm, biopsy moderate to poorly differentiated adenocarcinoma, mismatch repair protein  expression intact Mildly elevated CEA CTs 11/29/2021-mural thickening in the distal sigmoid/proximal rectum with haziness of the mesorectal fat, mesorectal lymphadenopathy, small pulmonary nodules favored to be benign MRI pelvis 12/03/2021-tumor at 10 cm from the anal verge, T3c, approximately 10 metastatic nodes in the mesorectal sheath and presacral space, no extra mesorectal lymphadenopathy, N2 Cycle 1 FOLFOX 12/19/2021 Cycle 2 FOLFOX 01/02/2022, home Decadron prophylaxis added for delayed nausea Cycle 3 FOLFOX 01/15/2022 Cycle 4 FOLFOX 01/29/2022, 5-FU bolus, leucovorin, and oxaliplatin dose reduced secondary to neutropenia and thrombocytopenia Cycle 5 FOLFOX 02/12/2022, Udenyca Cycle 6 FOLFOX 02/26/2022, Udenyca Cycle 7 FOLFOX 03/12/2022, Udenyca  Cycle 8 FOLFOX 03/26/2022, oxaliplatin held due to neuropathy, Udenyca held Radiation/Xeloda 04/22/2022-06/03/2022 CTs 08/28/2022-persistent submucosal thickening through the distal sigmoid colon and rectum, no mass lesion evident, decrease in size of lymph nodes in the mesorectal sheath with remaining enlarged lymph nodes, no evidence of metastatic disease Low anterior resection/diverting loop ileostomy 09/04/2022-no residual tumor, treatment effect present with no viable cancer cells, ypT0, 0/13 nodes, negative resection margins 12/18/2022 loop ileostomy takedown CTs 04/01/2023-no evidence of recurrent disease, stable subcentimeter pulmonary nodules 2.   Major depressive disorder Change in bowel habits and rectal bleeding secondary to #1 Ongoing tobacco use Report of blurred vision and diplopia Peripheral neuropathy-fingers and left foot    Disposition: Billy Roman is in clinical remission from rectal cancer.  He will return for an office visit and CEA in 6 months. I suspect the respiratory symptoms are secondary to COPD.  He will seek medical  attention if the symptoms do not improve.  I will consult with Dr. Leone Payor regarding the indication for a  surveillance colonoscopy.  He will seek medical attention for recurrent dark stool.  [name], MD  04/08/2023  11:22 AM

## 2023-04-09 NOTE — Telephone Encounter (Signed)
Pt made aware of Dr. Leone Payor recommendations: Pt was scheduled for a previsit on 04/23/2023 at 12:30 PM. Pt made aware. Location provided.  Pt was scheduled for a colonoscopy on 04/28/2023 at 4:00 PM with Dr. Leone Payor in the [facility]. Pt made aware.

## 2023-04-10 ENCOUNTER — Encounter: Payer: Self-pay | Admitting: Oncology

## 2023-04-22 ENCOUNTER — Telehealth: Payer: Self-pay

## 2023-04-22 NOTE — Telephone Encounter (Signed)
Spoke with patient letting him know that his Disability Parking Placard paperwork was signed and ready to be picked up. Patient stated would pick up today.

## 2023-04-23 ENCOUNTER — Encounter: Payer: Self-pay | Admitting: Internal Medicine

## 2023-04-23 ENCOUNTER — Ambulatory Visit (AMBULATORY_SURGERY_CENTER): Payer: 59 | Admitting: *Deleted

## 2023-04-23 VITALS — Ht 73.0 in | Wt 248.0 lb

## 2023-04-23 DIAGNOSIS — Z85048 Personal history of other malignant neoplasm of rectum, rectosigmoid junction, and anus: Secondary | ICD-10-CM

## 2023-04-23 DIAGNOSIS — Z860100 Personal history of colon polyps, unspecified: Secondary | ICD-10-CM

## 2023-04-23 MED ORDER — NA SULFATE-K SULFATE-MG SULF 17.5-3.13-1.6 GM/177ML PO SOLN
1.0000 | Freq: Once | ORAL | 0 refills | Status: AC
Start: 2023-04-23 — End: 2023-04-23

## 2023-04-23 NOTE — Progress Notes (Signed)
Pt's previsit is done over the phone and all paperwork (prep instructions) sent to patient.  Pt's name and DOB verified at the beginning of the previsit.  Pt denies any difficulty with ambulating.   No egg or soy allergy known to patient  No issues known to pt with past sedation with any surgeries or procedures Patient denies ever being told they had issues or difficulty with intubation  No FH of Malignant Hyperthermia Pt is not on diet pills Pt is not on  home 02  Pt is not on blood thinners  Pt denies issues with constipation  Pt is not on dialysis Pt denies any upcoming cardiac testing Pt encouraged to use to use Singlecare or Goodrx to reduce cost  Patient's chart reviewed by Cathlyn Parsons CNRA prior to previsit and patient appropriate for the [facility].  Previsit completed and red dot placed by patient's name on their procedure day (on provider's schedule).     Pt states that his daughter works in the asthma clinic next door to the [facility]; she is his emergency contact and his caregiver home that day.  He is going to ask whoever is dropping him off to stay while he is having his procedure.  I emphasized that it is our policy to have a caregiver here at all times. He states that daughter will be available ASAP if needed and will be here when he is discharged.    Dr. Raelyn Ensign and nurse manager, Rosann Auerbach LInk, made aware of this.   I called the patient back and told him whoever drives him must stay until his daughter gets here.  Understanding voiced

## 2023-04-25 ENCOUNTER — Telehealth: Payer: Self-pay

## 2023-04-25 DIAGNOSIS — Z860100 Personal history of colon polyps, unspecified: Secondary | ICD-10-CM

## 2023-04-25 MED ORDER — NA SULFATE-K SULFATE-MG SULF 17.5-3.13-1.6 GM/177ML PO SOLN
1.0000 | Freq: Once | ORAL | 0 refills | Status: AC
Start: 2023-04-25 — End: 2023-04-25

## 2023-04-25 NOTE — Telephone Encounter (Signed)
Called pt to come in early, pt stated that prep was not at pharmacy. Sent new prescription in to pt pharmacy.

## 2023-04-27 NOTE — Progress Notes (Unsigned)
[facility] History and Physical   Primary Care Physician:  Rema Fendt, NP   Reason for Procedure:  History of rectal cancer  Plan:    Colonoscopy     HPI: Billy Roman is a 62 y.o. male status post rectal cancer evaluated and treated as below, who prevents for a follow-up colonoscopy.  Adenocarcinoma of the proximal rectum/distal sigmoid Colonoscopy 11/19/2021-partially obstructing mass at 12-19 cm, biopsy moderate to poorly differentiated adenocarcinoma, mismatch repair protein expression intact Mildly elevated CEA CTs 11/29/2021-mural thickening in the distal sigmoid/proximal rectum with haziness of the mesorectal fat, mesorectal lymphadenopathy, small pulmonary nodules favored to be benign MRI pelvis 12/03/2021-tumor at 10 cm from the anal verge, T3c, approximately 10 metastatic nodes in the mesorectal sheath and presacral space, no extra mesorectal lymphadenopathy, N2 Cycle 1 FOLFOX 12/19/2021 Cycle 2 FOLFOX 01/02/2022, home Decadron prophylaxis added for delayed nausea Cycle 3 FOLFOX 01/15/2022 Cycle 4 FOLFOX 01/29/2022, 5-FU bolus, leucovorin, and oxaliplatin dose reduced secondary to neutropenia and thrombocytopenia Cycle 5 FOLFOX 02/12/2022, Udenyca Cycle 6 FOLFOX 02/26/2022, Udenyca Cycle 7 FOLFOX 03/12/2022, Udenyca  Cycle 8 FOLFOX 03/26/2022, oxaliplatin held due to neuropathy, Udenyca held Radiation/Xeloda 04/22/2022-06/03/2022 CTs 08/28/2022-persistent submucosal thickening through the distal sigmoid colon and rectum, no mass lesion evident, decrease in size of lymph nodes in the mesorectal sheath with remaining enlarged lymph nodes, no evidence of metastatic disease Low anterior resection/diverting loop ileostomy 09/04/2022-no residual tumor, treatment effect present with no viable cancer cells, ypT0, 0/13 nodes, negative resection margins 12/18/2022 loop ileostomy takedown CTs 04/01/2023-no evidence of recurrent disease, stable subcentimeter pulmonary  nodules Past Medical History:  Diagnosis Date   Anxiety    Arthritis    Complication of anesthesia    Nausea   COPD (chronic obstructive pulmonary disease) (HCC)    Dyspnea    Dysrhythmia    RBBB   [name], recurrent severe, without psychosis (HCC) 08/23/2021   Pneumonia    Rectal cancer (HCC) 11/20/2021   Sleep apnea     Past Surgical History:  Procedure Laterality Date   BIOPSY  10/17/2022   Procedure: BIOPSY;  Surgeon: Andria Meuse, MD;  Location: [facility];  Service: General;;   COLONOSCOPY     DIVERTING ILEOSTOMY N/A 09/04/2022   Procedure: LOOP ILEOSTOMY;  Surgeon: Andria Meuse, MD;  Location: [facility];  Service: General;  Laterality: N/A;   FLEXIBLE SIGMOIDOSCOPY N/A 09/04/2022   Procedure: Arnell Sieving;  Surgeon: Andria Meuse, MD;  Location: [facility];  Service: General;  Laterality: N/A;   FLEXIBLE SIGMOIDOSCOPY N/A 10/17/2022   Procedure: FLEXIBLE SIGMOIDOSCOPY;  Surgeon: Andria Meuse, MD;  Location: [facility];  Service: General;  Laterality: N/A;   ILEOSTOMY CLOSURE N/A 12/18/2022   Procedure: LOOP ILEOSTOMY REVERSAL;  Surgeon: Andria Meuse, MD;  Location: [facility];  Service: General;  Laterality: N/A;   OPERATIVE ULTRASOUND Right 12/17/2021   Procedure: OPERATIVE ULTRASOUND;  Surgeon: Andria Meuse, MD;  Location: [facility];  Service: General;  Laterality: Right;   POLYPECTOMY  10/17/2022   Procedure: POLYPECTOMY;  Surgeon: Andria Meuse, MD;  Location: [facility];  Service: General;;   PORT-A-CATH REMOVAL N/A 05/20/2022   Procedure: REMOVAL PORT-A-CATH;  Surgeon: Andria Meuse, MD;  Location: [facility] [place];  Service: General;  Laterality: N/A;   PORTACATH PLACEMENT Right 12/17/2021   Procedure: INSERTION PORT-A-CATH;  Surgeon: Andria Meuse, MD;  Location: [facility];  Service: General;  Laterality: Right;   RHINOPLASTY  SEPTOPLASTY     SHOULDER  ARTHROSCOPY Left    SHOULDER ARTHROSCOPY WITH OPEN ROTATOR CUFF REPAIR AND DISTAL CLAVICLE ACROMINECTOMY Right 02/01/2021   Procedure: RIGHT SHOULDER ARTHROSCOPY WITH MINI OPEN ROTATOR CUFF REPAIR AND DISTAL CLAVICLE EXCISION;  Surgeon: Juanell Fairly, MD;  Location: [facility];  Service: Orthopedics;  Laterality: Right;   SPINE SURGERY     lumbar laminectomy   WRIST SURGERY Bilateral    corpal tunnel   XI ROBOTIC ASSISTED LOWER ANTERIOR RESECTION N/A 09/04/2022   Procedure: ROBOTIC LOW ANTERIOR RESECTION, BILATERAL TAP BLOCK, AND INOPERATIVE ASSESSMENT OF PERFUSION USING FIREFLY DYE;  Surgeon: Andria Meuse, MD;  Location: [facility];  Service: General;  Laterality: N/A;    Prior to Admission medications   Medication Sig Start Date End Date Taking? Authorizing Provider  gabapentin (NEURONTIN) 300 MG capsule Take 1 capsule (300 mg total) by mouth 2 (two) times daily as needed (neuropathy). 12/09/22   Levert Feinstein, MD  methocarbamol (ROBAXIN) 500 MG tablet Take 500 mg by mouth every 8 (eight) hours as needed for muscle spasms. 09/12/22   [provider]  OVER THE COUNTER MEDICATION Take 2 capsules by mouth daily. Honeysuckle extract    [provider]  predniSONE (DELTASONE) 50 MG tablet Take 50 mg 13 hours, 7 hours and 1 hour prior to CT scan. 03/28/23   Ladene Artist, MD  prochlorperazine (COMPAZINE) 10 MG tablet Take 1 tablet (10 mg total) by mouth every 6 (six) hours as needed for nausea or vomiting. 02/12/22   Ladene Artist, MD  silver sulfADIAZINE (SILVADENE) 1 % cream Apply 1 Application topically 2 (two) times daily. Patient not taking: Reported on 02/18/2023    [provider]  traZODone (DESYREL) 100 MG tablet Take 1 tablet (100 mg total) by mouth at bedtime as needed. for sleep 02/11/23   Glean Salvo, NP    Current Outpatient Medications  Medication Sig Dispense Refill   gabapentin (NEURONTIN) 300 MG capsule Take 1 capsule (300 mg total) by mouth 2  (two) times daily as needed (neuropathy). 60 capsule 11   methocarbamol (ROBAXIN) 500 MG tablet Take 500 mg by mouth every 8 (eight) hours as needed for muscle spasms.     traZODone (DESYREL) 100 MG tablet Take 1 tablet (100 mg total) by mouth at bedtime as needed. for sleep 30 tablet 5   OVER THE COUNTER MEDICATION Take 2 capsules by mouth daily. Honeysuckle extract     predniSONE (DELTASONE) 50 MG tablet Take 50 mg 13 hours, 7 hours and 1 hour prior to CT scan. (Patient not taking: Reported on 04/28/2023) 3 tablet 0   prochlorperazine (COMPAZINE) 10 MG tablet Take 1 tablet (10 mg total) by mouth every 6 (six) hours as needed for nausea or vomiting. 30 tablet 0   silver sulfADIAZINE (SILVADENE) 1 % cream Apply 1 Application topically 2 (two) times daily. (Patient not taking: Reported on 02/18/2023)     Current Facility-Administered Medications  Medication Dose Route Frequency Provider Last Rate Last Admin   0.9 %  sodium chloride infusion  500 mL Intravenous Once Iva Boop, MD        Allergies as of 04/28/2023 - Review Complete 04/28/2023  Allergen Reaction Noted   Omnipaque [iohexol] Nausea And Vomiting 08/28/2022   Propyphenazone Anaphylaxis, Itching, and Rash 10/08/2015    Family History  Adopted: Yes  Problem Relation Age of Onset   Colon cancer Neg Hx    Stomach cancer Neg Hx  Esophageal cancer Neg Hx    Rectal cancer Neg Hx     Social History   Socioeconomic History   Marital status: Single    Spouse name: Not on file   Number of children: 2   Years of education: Not on file   Highest education level: Not on file  Occupational History   Occupation: tech support  Tobacco Use   Smoking status: Some Days    Current packs/day: 0.00    Average packs/day: 0.3 packs/day for 30.0 years (7.5 ttl pk-yrs)    Types: Cigarettes    Start date: 11/22/1990    Last attempt to quit: 11/21/2020    Years since quitting: 2.4    Passive exposure: Never   Smokeless tobacco: Former     Types: Chew    Quit date: 1990  Vaping Use   Vaping status: Never Used  Substance and Sexual Activity   Alcohol use: Yes    Comment: rare   Drug use: Yes    Frequency: 1.0 times per week    Types: Marijuana    Comment: Gummies - has had in last 24 hours per patient   Sexual activity: Not Currently  Other Topics Concern   Not on file  Social History Narrative   Divorced, 2 daughters   Works in [employer] for [employer]   1 alcoholic drink a day smokes cigarettes, uses marijuana, 0-1 caffeinated beverages daily   Social Determinants of Health   Financial Resource Strain: Medium Risk (01/15/2022)   Overall Financial Resource Strain (CARDIA)    Difficulty of Paying Living Expenses: Somewhat hard  Food Insecurity: No Food Insecurity (12/24/2022)   Hunger Vital Sign    Worried About Running Out of Food in the Last Year: Never true    Ran Out of Food in the Last Year: Never true  Recent Concern: Food Insecurity - Food Insecurity Present (12/19/2022)   Hunger Vital Sign    Worried About Running Out of Food in the Last Year: Sometimes true    Ran Out of Food in the Last Year: Sometimes true  Transportation Needs: Unmet Transportation Needs (12/22/2022)   PRAPARE - Administrator, Civil Service (Medical): Yes    Lack of Transportation (Non-Medical): Yes  Physical Activity: Not on file  Stress: Not on file  Social Connections: Not on file  Intimate Partner Violence: Not At Risk (12/19/2022)   Humiliation, Afraid, Rape, and Kick questionnaire    Fear of Current or Ex-Partner: No    Emotionally Abused: No    Physically Abused: No    Sexually Abused: No    Review of Systems:  All other review of systems negative except as mentioned in the HPI.  Physical Exam: Vital signs BP (!) 141/87   Pulse 67   Temp (!) 97.5 F (36.4 C) (Temporal)   Ht 6\' 1"  (1.854 m)   Wt 249 lb (112.9 kg)   SpO2 98%   BMI 32.85 kg/m   General:   Alert,  Well-developed, well-nourished, pleasant  and cooperative in NAD Lungs:  Clear throughout to auscultation.   Heart:  Regular rate and rhythm; no murmurs, clicks, rubs,  or gallops. Abdomen:  Soft, nontender and nondistended. Normal bowel sounds.   Neuro/Psych:  Alert and cooperative. Normal mood and affect. A and O x 3   @Trase Bunda  Sena Slate, MD, [facility] [phone] (pager) 04/28/2023 9:36 AM@

## 2023-04-28 ENCOUNTER — Ambulatory Visit (AMBULATORY_SURGERY_CENTER): Payer: 59 | Admitting: Internal Medicine

## 2023-04-28 ENCOUNTER — Encounter: Payer: Self-pay | Admitting: Internal Medicine

## 2023-04-28 VITALS — BP 109/73 | HR 60 | Temp 97.5°F | Resp 13 | Ht 73.0 in | Wt 249.0 lb

## 2023-04-28 DIAGNOSIS — Z85048 Personal history of other malignant neoplasm of rectum, rectosigmoid junction, and anus: Secondary | ICD-10-CM

## 2023-04-28 DIAGNOSIS — Z08 Encounter for follow-up examination after completed treatment for malignant neoplasm: Secondary | ICD-10-CM | POA: Diagnosis not present

## 2023-04-28 DIAGNOSIS — G4730 Sleep apnea, unspecified: Secondary | ICD-10-CM | POA: Diagnosis not present

## 2023-04-28 DIAGNOSIS — Z860100 Personal history of colon polyps, unspecified: Secondary | ICD-10-CM | POA: Diagnosis not present

## 2023-04-28 DIAGNOSIS — Z1211 Encounter for screening for malignant neoplasm of colon: Secondary | ICD-10-CM | POA: Diagnosis not present

## 2023-04-28 DIAGNOSIS — F419 Anxiety disorder, unspecified: Secondary | ICD-10-CM | POA: Diagnosis not present

## 2023-04-28 DIAGNOSIS — J449 Chronic obstructive pulmonary disease, unspecified: Secondary | ICD-10-CM | POA: Diagnosis not present

## 2023-04-28 MED ORDER — SODIUM CHLORIDE 0.9 % IV SOLN
500.0000 mL | Freq: Once | INTRAVENOUS | Status: AC
Start: 2023-04-28 — End: ?

## 2023-04-28 NOTE — Progress Notes (Signed)
Vss nad trans to pacu 

## 2023-04-28 NOTE — Progress Notes (Signed)
VS completed by RY  Pt's states no medical or surgical changes since previsit or office visit.

## 2023-04-28 NOTE — Patient Instructions (Addendum)
I did not see any signs of polyps or cancer.  You do have diverticulosis - thickened muscle rings and pouches in the colon wall. Please read the handout about this condition.  Recommend repeat in 1 year.  I appreciate the opportunity to care for you. Iva Boop, MD, FACG   YOU HAD AN ENDOSCOPIC PROCEDURE TODAY AT THE  ENDOSCOPY CENTER:   Refer to the procedure report that was given to you for any specific questions about what was found during the examination.  If the procedure report does not answer your questions, please call your gastroenterologist to clarify.  If you requested that your care partner not be given the details of your procedure findings, then the procedure report has been included in a sealed envelope for you to review at your convenience later.  YOU SHOULD EXPECT: Some feelings of bloating in the abdomen. Passage of more gas than usual.  Walking can help get rid of the air that was put into your GI tract during the procedure and reduce the bloating. If you had a lower endoscopy (such as a colonoscopy or flexible sigmoidoscopy) you may notice spotting of blood in your stool or on the toilet paper. If you underwent a bowel prep for your procedure, you may not have a normal bowel movement for a few days.  Please Note:  You might notice some irritation and congestion in your nose or some drainage.  This is from the oxygen used during your procedure.  There is no need for concern and it should clear up in a day or so.  SYMPTOMS TO REPORT IMMEDIATELY:  Following lower endoscopy (colonoscopy or flexible sigmoidoscopy):  Excessive amounts of blood in the stool  Significant tenderness or worsening of abdominal pains  Swelling of the abdomen that is new, acute  Fever of 100F or higher  For urgent or emergent issues, a gastroenterologist can be reached at any hour by calling [phone]. Do not use MyChart messaging for urgent concerns.    DIET:  We do recommend a  small meal at first, but then you may proceed to your regular diet.  Drink plenty of fluids but you should avoid alcoholic beverages for 24 hours.  ACTIVITY:  You should plan to take it easy for the rest of today and you should NOT DRIVE or use heavy machinery until tomorrow (because of the sedation medicines used during the test).    FOLLOW UP: Our staff will call the number listed on your records the next business day following your procedure.  We will call around 7:15- 8:00 am to check on you and address any questions or concerns that you may have regarding the information given to you following your procedure. If we do not reach you, we will leave a message.     If any biopsies were taken you will be contacted by phone or by letter within the next 1-3 weeks.  Please call us at [phone] if you have not heard about the biopsies in 3 weeks.    SIGNATURES/CONFIDENTIALITY: You and/or your care partner have signed paperwork which will be entered into your electronic medical record.  These signatures attest to the fact that that the information above on your After Visit Summary has been reviewed and is understood.  Full responsibility of the confidentiality of this discharge information lies with you and/or your care-partner.

## 2023-04-28 NOTE — Op Note (Signed)
[facility] Patient Name: Billy Roman Procedure Date: 04/28/2023 9:40 AM MRN: [id number] Endoscopist: Iva Boop , MD, [id number] Age: 62 Referring MD:  Date of Birth: Nov 10, 1960 Gender: Male Account #: [id number] Procedure:                Colonoscopy Indications:              High risk colon cancer surveillance: Personal                            history of rectal cancer Medicines:                Monitored Anesthesia Care Procedure:                Pre-Anesthesia Assessment:                           - Prior to the procedure, a History and Physical                            was performed, and patient medications and                            allergies were reviewed. The patient's tolerance of                            previous anesthesia was also reviewed. The risks                            and benefits of the procedure and the sedation                            options and risks were discussed with the patient.                            All questions were answered, and informed consent                            was obtained. Prior Anticoagulants: The patient has                            taken no anticoagulant or antiplatelet agents. ASA                            Grade Assessment: III - A patient with severe                            systemic disease. After reviewing the risks and                            benefits, the patient was deemed in satisfactory                            condition to undergo the procedure.  After obtaining informed consent, the colonoscope                            was passed under direct vision. Throughout the                            procedure, the patient's blood pressure, pulse, and                            oxygen saturations were monitored continuously. The                            Olympus Scope [id number] was introduced through the                            anus and advanced to the the  cecum, identified by                            appendiceal orifice and ileocecal valve. The                            colonoscopy was performed without difficulty. The                            patient tolerated the procedure well. The quality                            of the bowel preparation was fair to adequate. The                            ileocecal valve, appendiceal orifice, and rectum                            were photographed. The bowel preparation used was                            SUPREP via split dose instruction. Scope In: 9:41:18 AM Scope Out: 10:01:53 AM Scope Withdrawal Time: 0 hours 14 minutes 40 seconds  Total Procedure Duration: 0 hours 20 minutes 35 seconds  Findings:                 The perianal and digital rectal examinations were                            normal.                           There was evidence of a prior end-to-end                            colo-colonic anastomosis in the rectum. This was                            patent and was characterized by edema and erythema.  The anastomosis was traversed.                           A tattoo was seen in the rectum.                           Multiple diverticula were found in the sigmoid                            colon.                           The exam was otherwise without abnormality on                            direct and retroflexion views. Complications:            No immediate complications. Estimated Blood Loss:     Estimated blood loss: none. Impression:               - Preparation of the colon was fair to adequate -                            right colon was fair - remainder adequate                           - Patent end-to-end colo-colonic anastomosis,                            characterized by edema and erythema.                           - A tattoo was seen in the rectum.                           - Diverticulosis in the sigmoid colon.                            - The examination was otherwise normal on direct                            and retroflexion views.                           - No specimens collected.                           - Personal history of malignant rectal cancer                            treated with neo-adjuvant chemotx and then resected                            2023-24 Recommendation:           - Patient has a contact number available for  emergencies. The signs and symptoms of potential                            delayed complications were discussed with the                            patient. Return to normal activities tomorrow.                            Written discharge instructions were provided to the                            patient.                           - Resume previous diet.                           - Continue present medications.                           - Repeat colonoscopy in 1 year for surveillance.                            Better fiber restriction and consider different vs                            extra prep. Iva Boop, MD 04/28/2023 10:15:19 AM This report has been signed electronically.

## 2023-04-29 ENCOUNTER — Telehealth: Payer: Self-pay

## 2023-04-29 NOTE — Telephone Encounter (Signed)
No answer, left message to call if having any issues or concerns, B.Lj Miyamoto RN 

## 2023-05-12 DIAGNOSIS — C20 Malignant neoplasm of rectum: Secondary | ICD-10-CM | POA: Diagnosis not present

## 2023-05-20 DIAGNOSIS — F4323 Adjustment disorder with mixed anxiety and depressed mood: Secondary | ICD-10-CM | POA: Diagnosis not present

## 2023-06-23 ENCOUNTER — Ambulatory Visit (HOSPITAL_COMMUNITY): Payer: Self-pay | Admitting: Student

## 2023-07-02 ENCOUNTER — Other Ambulatory Visit (HOSPITAL_COMMUNITY): Payer: Self-pay

## 2023-07-07 ENCOUNTER — Telehealth: Payer: Self-pay | Admitting: *Deleted

## 2023-07-07 NOTE — Telephone Encounter (Signed)
Billy Roman called w/URI symptoms and asking what to take? Instructed him to take any OTC antihistamine,decongestant/expectorant. Push fluids. If you worsen, reach out to PCP or go to urgent care. His last chemo was 2023, so his immune system is same as anyone.

## 2023-07-23 ENCOUNTER — Telehealth: Payer: Self-pay | Admitting: *Deleted

## 2023-07-23 NOTE — Telephone Encounter (Signed)
 Disability physician, Dr. Alejo Amsler asking to make an appointment for a 10 minute p2p conversation with Dr. Scherrie Curt to assist him in making his decision on his disability claim. Case #[id number] (reportedly it is urgent).

## 2023-07-24 ENCOUNTER — Telehealth: Payer: Self-pay | Admitting: Neurology

## 2023-07-24 NOTE — Telephone Encounter (Signed)
 Called and scheduled the peer to peer for 07/28/23 4:30 they are to contact dr. Torrance Freestone personal cell phone

## 2023-07-24 NOTE — Telephone Encounter (Signed)
 [employer]/ [place] calling to set up a Peer to Peer with Dr. Gracie Lav for disability benefits. Would like a call back. Reference number:  [id number]

## 2023-07-28 ENCOUNTER — Ambulatory Visit (HOSPITAL_BASED_OUTPATIENT_CLINIC_OR_DEPARTMENT_OTHER): Payer: Commercial Managed Care - HMO | Admitting: Student

## 2023-07-28 ENCOUNTER — Encounter: Payer: Self-pay | Admitting: Oncology

## 2023-07-28 DIAGNOSIS — F1290 Cannabis use, unspecified, uncomplicated: Secondary | ICD-10-CM

## 2023-07-28 DIAGNOSIS — F332 Major depressive disorder, recurrent severe without psychotic features: Secondary | ICD-10-CM | POA: Diagnosis not present

## 2023-07-28 DIAGNOSIS — F4312 Post-traumatic stress disorder, chronic: Secondary | ICD-10-CM | POA: Diagnosis not present

## 2023-07-28 DIAGNOSIS — F17200 Nicotine dependence, unspecified, uncomplicated: Secondary | ICD-10-CM | POA: Diagnosis not present

## 2023-07-28 MED ORDER — SERTRALINE HCL 25 MG PO TABS: 25.0000 mg | ORAL_TABLET | Freq: Every day | ORAL | 1 refills | Status: DC

## 2023-07-28 NOTE — Patient Instructions (Addendum)
 For sertraline , start with 25 mg daily for 2 weeks then increase to 50 mg daily. If you experience GI side effects lasting longer than 1 week, continue 25 mg without increase. If GI side effects last longer than 2 weeks, hold off on medication and give a call to the clinic at [phone].
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
## 2023-07-30 NOTE — Telephone Encounter (Signed)
I called and spoke to [employer] and they stated that the case is completed because they didn't det the reviewer from their end on the line.

## 2023-08-04 ENCOUNTER — Encounter (HOSPITAL_COMMUNITY): Payer: Self-pay | Admitting: Student

## 2023-08-04 DIAGNOSIS — F4312 Post-traumatic stress disorder, chronic: Secondary | ICD-10-CM | POA: Insufficient documentation

## 2023-08-04 DIAGNOSIS — F1290 Cannabis use, unspecified, uncomplicated: Secondary | ICD-10-CM | POA: Insufficient documentation

## 2023-08-06 NOTE — Addendum Note (Signed)
Addended by: Everlena Cooper on: 08/06/2023 09:42 AM   Modules accepted: Level of Service

## 2023-08-19 ENCOUNTER — Other Ambulatory Visit (HOSPITAL_COMMUNITY): Payer: Self-pay | Admitting: Student

## 2023-09-01 ENCOUNTER — Ambulatory Visit (HOSPITAL_COMMUNITY): Payer: Commercial Managed Care - HMO | Admitting: Student

## 2023-09-01 DIAGNOSIS — F4312 Post-traumatic stress disorder, chronic: Secondary | ICD-10-CM | POA: Diagnosis not present

## 2023-09-01 DIAGNOSIS — F332 Major depressive disorder, recurrent severe without psychotic features: Secondary | ICD-10-CM

## 2023-09-01 DIAGNOSIS — F1290 Cannabis use, unspecified, uncomplicated: Secondary | ICD-10-CM

## 2023-09-01 DIAGNOSIS — F17200 Nicotine dependence, unspecified, uncomplicated: Secondary | ICD-10-CM | POA: Diagnosis not present
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
## 2023-09-03 ENCOUNTER — Encounter (HOSPITAL_COMMUNITY): Payer: Self-pay | Admitting: Student

## 2023-09-03 MED ORDER — SERTRALINE HCL 100 MG PO TABS: 100.0000 mg | ORAL_TABLET | Freq: Every day | ORAL | 1 refills | Status: DC

## 2023-09-03 NOTE — Addendum Note (Signed)
 Addended by: Everlena Cooper on: 09/03/2023 10:29 AM   Modules accepted: Level of Service

## 2023-09-12 ENCOUNTER — Encounter: Payer: Self-pay | Admitting: Oncology

## 2023-10-01 ENCOUNTER — Other Ambulatory Visit (HOSPITAL_COMMUNITY): Payer: Self-pay | Admitting: Student

## 2023-10-07 ENCOUNTER — Other Ambulatory Visit: Payer: Self-pay | Admitting: *Deleted

## 2023-10-07 ENCOUNTER — Inpatient Hospital Stay: Payer: 59 | Attending: Oncology | Admitting: Oncology

## 2023-10-07 ENCOUNTER — Other Ambulatory Visit: Payer: 59

## 2023-10-07 ENCOUNTER — Encounter: Payer: Self-pay | Admitting: Oncology

## 2023-10-07 ENCOUNTER — Telehealth: Payer: Self-pay

## 2023-10-07 ENCOUNTER — Inpatient Hospital Stay: Payer: Self-pay

## 2023-10-07 VITALS — BP 121/75 | HR 70 | Temp 98.2°F | Resp 20 | Ht 73.0 in | Wt 244.3 lb

## 2023-10-07 DIAGNOSIS — Z08 Encounter for follow-up examination after completed treatment for malignant neoplasm: Secondary | ICD-10-CM | POA: Insufficient documentation

## 2023-10-07 DIAGNOSIS — C20 Malignant neoplasm of rectum: Secondary | ICD-10-CM | POA: Diagnosis not present

## 2023-10-07 DIAGNOSIS — R0609 Other forms of dyspnea: Secondary | ICD-10-CM | POA: Diagnosis not present

## 2023-10-07 DIAGNOSIS — G629 Polyneuropathy, unspecified: Secondary | ICD-10-CM | POA: Diagnosis not present

## 2023-10-07 DIAGNOSIS — J441 Chronic obstructive pulmonary disease with (acute) exacerbation: Secondary | ICD-10-CM

## 2023-10-07 DIAGNOSIS — F17210 Nicotine dependence, cigarettes, uncomplicated: Secondary | ICD-10-CM | POA: Diagnosis not present

## 2023-10-07 DIAGNOSIS — R3915 Urgency of urination: Secondary | ICD-10-CM | POA: Insufficient documentation

## 2023-10-07 DIAGNOSIS — Z85048 Personal history of other malignant neoplasm of rectum, rectosigmoid junction, and anus: Secondary | ICD-10-CM | POA: Diagnosis present

## 2023-10-07 DIAGNOSIS — J449 Chronic obstructive pulmonary disease, unspecified: Secondary | ICD-10-CM | POA: Diagnosis not present

## 2023-10-07 DIAGNOSIS — Z9221 Personal history of antineoplastic chemotherapy: Secondary | ICD-10-CM | POA: Insufficient documentation

## 2023-10-07 LAB — CEA (ACCESS): CEA (CHCC): 3.61 ng/mL (ref 0.00–5.00)

## 2023-10-07 NOTE — Telephone Encounter (Signed)
 Patient gave verbal understanding and had no further questions or concerns

## 2023-10-07 NOTE — Progress Notes (Signed)
 [facility] OFFICE PROGRESS NOTE   Diagnosis: Rectal cancer  INTERVAL HISTORY:   Mr. Minahan returns as scheduled.  He continues to have neuropathy symptoms and rectal/urinary urgency.  No rectal bleeding.  He continues smoking.  He has exertional dyspnea.  Objective:  Vital signs in last 24 hours:  Blood pressure 121/75, pulse 70, temperature 98.2 F (36.8 C), temperature source Temporal, resp. rate 20, height 6\' 1"  (1.854 m), weight 244 lb 4.8 oz (110.8 kg), SpO2 100%.   HEENT: Slight prominence of the left compared to the right submandibular gland.  Oropharynx without visible mass Lymphatics: No cervical, supraclavicular, axillary, or inguinal nodes Resp: Distant breath sounds, no respiratory distress Cardio: Regular rate and rhythm GI: No hepatosplenomegaly, no mass Vascular: No leg edema   Lab Results:  Lab Results  Component Value Date   WBC 8.0 12/20/2022   HGB 13.4 12/20/2022   HCT 40.6 12/20/2022   MCV 92.3 12/20/2022   PLT 203 12/20/2022   NEUTROABS 2.1 08/27/2022    CMP  Lab Results  Component Value Date   NA 138 04/01/2023   K 4.1 04/01/2023   CL 103 04/01/2023   CO2 26 04/01/2023   GLUCOSE 131 (H) 04/01/2023   BUN 16 04/01/2023   CREATININE 0.71 04/01/2023   CALCIUM  9.9 04/01/2023   PROT 7.4 08/27/2022   ALBUMIN  4.1 08/27/2022   AST 32 08/27/2022   ALT 32 08/27/2022   ALKPHOS 55 08/27/2022   BILITOT 0.6 08/27/2022   GFRNONAA >60 04/01/2023   GFRAA 117 07/26/2019    Lab Results  Component Value Date   CEA 3.61 10/07/2023     Medications: I have reviewed the patient's current medications.   Assessment/Plan: Adenocarcinoma of the proximal rectum/distal sigmoid Colonoscopy 11/19/2021-partially obstructing mass at 12-19 cm, biopsy moderate to poorly differentiated adenocarcinoma, mismatch repair protein expression intact Mildly elevated CEA CTs 11/29/2021-mural thickening in the distal sigmoid/proximal rectum with haziness  of the mesorectal fat, mesorectal lymphadenopathy, small pulmonary nodules favored to be benign MRI pelvis 12/03/2021-tumor at 10 cm from the anal verge, T3c, approximately 10 metastatic nodes in the mesorectal sheath and presacral space, no extra mesorectal lymphadenopathy, N2 Cycle 1 FOLFOX 12/19/2021 Cycle 2 FOLFOX 01/02/2022, home Decadron  prophylaxis added for delayed nausea Cycle 3 FOLFOX 01/15/2022 Cycle 4 FOLFOX 01/29/2022, 5-FU bolus, leucovorin , and oxaliplatin  dose reduced secondary to neutropenia and thrombocytopenia Cycle 5 FOLFOX 02/12/2022, Udenyca  Cycle 6 FOLFOX 02/26/2022, Udenyca  Cycle 7 FOLFOX 03/12/2022, Udenyca   Cycle 8 FOLFOX 03/26/2022, oxaliplatin  held due to neuropathy, Udenyca  held Radiation/Xeloda  04/22/2022-06/03/2022 CTs 08/28/2022-persistent submucosal thickening through the distal sigmoid colon and rectum, no mass lesion evident, decrease in size of lymph nodes in the mesorectal sheath with remaining enlarged lymph nodes, no evidence of metastatic disease Low anterior resection/diverting loop ileostomy 09/04/2022-no residual tumor, treatment effect present with no viable cancer cells, ypT0, 0/13 nodes, negative resection margins 12/18/2022 loop ileostomy takedown CTs 04/01/2023-no evidence of recurrent disease, stable subcentimeter pulmonary nodules Colonoscopy 04/28/2023: No polyps ,fair to adequate prep, 1 year surveillance colonoscopy recommended 2.   Major depressive disorder Change in bowel habits and rectal bleeding secondary to #1 Ongoing tobacco use Report of blurred vision and diplopia Peripheral neuropathy-fingers and left foot     Disposition: Billy Roman is in clinical remission from rectal cancer.  He will return for an office visit, CEA, and surveillance CTs in 6 months.  He will continue colonoscopy surveillance with Dr. Willy Harvest. He requested a pulmonary medicine referral for management of COPD.  Coni Deep, MD  10/07/2023  1:26 PM

## 2023-10-07 NOTE — Telephone Encounter (Signed)
-----   Message from Coni Deep sent at 10/07/2023  2:34 PM EDT ----- Please call patient, CEA is normal, follow-up as scheduled

## 2023-10-08 ENCOUNTER — Ambulatory Visit (HOSPITAL_COMMUNITY): Admitting: Student

## 2023-10-08 DIAGNOSIS — F17200 Nicotine dependence, unspecified, uncomplicated: Secondary | ICD-10-CM | POA: Diagnosis not present

## 2023-10-08 DIAGNOSIS — F1290 Cannabis use, unspecified, uncomplicated: Secondary | ICD-10-CM | POA: Diagnosis not present

## 2023-10-08 DIAGNOSIS — F4312 Post-traumatic stress disorder, chronic: Secondary | ICD-10-CM

## 2023-10-08 DIAGNOSIS — F332 Major depressive disorder, recurrent severe without psychotic features: Secondary | ICD-10-CM

## 2023-10-08 MED ORDER — SERTRALINE HCL 25 MG PO TABS: 25.0000 mg | ORAL_TABLET | Freq: Every day | ORAL | 1 refills | Status: DC

## 2023-10-08 MED ORDER — SERTRALINE HCL 50 MG PO TABS: 50.0000 mg | ORAL_TABLET | Freq: Every day | ORAL | 1 refills | Status: DC

## 2023-10-08 MED ORDER — HYDROXYZINE HCL 50 MG PO TABS: 50.0000 mg | ORAL_TABLET | Freq: Every evening | ORAL | 1 refills | Status: DC | PRN
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
## 2023-10-09 ENCOUNTER — Other Ambulatory Visit: Payer: Self-pay

## 2023-10-10 ENCOUNTER — Ambulatory Visit: Admitting: Family

## 2023-10-10 ENCOUNTER — Encounter: Payer: Self-pay | Admitting: Family

## 2023-10-10 ENCOUNTER — Encounter (HOSPITAL_COMMUNITY): Payer: Self-pay | Admitting: Student

## 2023-10-10 VITALS — BP 128/75 | HR 68 | Temp 98.0°F | Resp 18 | Ht 73.0 in | Wt 239.4 lb

## 2023-10-10 DIAGNOSIS — N529 Male erectile dysfunction, unspecified: Secondary | ICD-10-CM

## 2023-10-10 MED ORDER — TADALAFIL 5 MG PO TABS: ORAL_TABLET | ORAL | 2 refills | Status: DC

## 2023-10-10 NOTE — Progress Notes (Signed)
 Patient ID: Billy Roman, male    DOB: 1961-06-06  MRN: [id number]  CC: Erectile Dysfunction  Subjective: Billy Roman is a 63 y.o. male who presents for erectile dysfunction.  His concerns today include:  - States Psychiatry told him to see Primary Care for erectile dysfunction related to side effects of Sertraline  and Hydroxyzine . He denies thoughts of self-harm, suicidal ideations, homicidal ideations. - States he plans to call Pulmonology soon to schedule appointment.  Patient Active Problem List   Diagnosis Date Noted   Chronic post-traumatic stress disorder (PTSD) 08/04/2023   Cannabis use, uncomplicated 08/04/2023   Prediabetes 02/19/2023   S/P closure of ileostomy 12/18/2022   Essential tremor 12/09/2022   Irritant contact dermatitis associated with fecal stoma 10/13/2022   Right inguinal hernia 09/05/2022   Insomnia 09/05/2022   Chronic pain 09/05/2022   S/P robot-assisted surgical procedure 09/04/2022   Ileostomy care (HCC) 09/04/2022   Rectal cancer (HCC) 11/20/2021   Cognitive impairment 09/18/2021   Confusion 09/18/2021   Tremor 09/18/2021   Memory loss 08/24/2021   Tremor of both hands 08/24/2021   H/O rotator cuff surgery 08/24/2021   History of COVID-19 08/24/2021   Nicotine  use disorder 08/24/2021   [name], recurrent severe, without psychosis (HCC) 08/23/2021   Stiffness of right shoulder joint 02/09/2021   Pain in joint of right shoulder 02/09/2021   HNP (herniated nucleus pulposus), lumbar 01/15/2021   Sciatica 01/15/2021   Anxiety and depression 08/23/2009     Current Outpatient Medications on File Prior to Visit  Medication Sig Dispense Refill   gabapentin  (NEURONTIN ) 300 MG capsule Take 1 capsule (300 mg total) by mouth 2 (two) times daily as needed (neuropathy). 60 capsule 11   hydrOXYzine  (ATARAX ) 50 MG tablet Take 1 tablet (50 mg total) by mouth at bedtime as needed (sleep). 30 each 1   OVER THE COUNTER  MEDICATION Take 2 capsules by mouth daily. Honeysuckle extract     prochlorperazine  (COMPAZINE ) 10 MG tablet Take 1 tablet (10 mg total) by mouth every 6 (six) hours as needed for nausea or vomiting. 30 tablet 0   sertraline  (ZOLOFT ) 25 MG tablet Take 1 tablet (25 mg total) by mouth daily. Take with 50 mg tablet for a total of 75 mg. 30 tablet 1   sertraline  (ZOLOFT ) 50 MG tablet Take 1 tablet (50 mg total) by mouth daily. Take with 25 mg tablet for a total of 75 mg. 30 tablet 1   methocarbamol  (ROBAXIN ) 500 MG tablet Take 500 mg by mouth every 8 (eight) hours as needed for muscle spasms. (Patient not taking: Reported on 10/07/2023)     Current Facility-Administered Medications on File Prior to Visit  Medication Dose Route Frequency Provider Last Rate Last Admin   0.9 %  sodium chloride  infusion  500 mL Intravenous Once Kenney Peacemaker, MD        Allergies  Allergen Reactions   Omnipaque  [Iohexol ] Nausea And Vomiting    08/28/22- second time vomiting having scan with IV contrast Perfuse vomiting.    Propyphenazone Anaphylaxis, Itching and Rash    Not able to move    Social History   Socioeconomic History   Marital status: Single    Spouse name: Not on file   Number of children: 2   Years of education: Not on file   Highest education level: Not on file  Occupational History   Occupation: tech support  Tobacco Use   Smoking status: Every Day  Current packs/day: 0.00    Average packs/day: 0.3 packs/day for 30.0 years (7.5 ttl pk-yrs)    Types: Cigarettes    Start date: 11/22/1990    Last attempt to quit: 11/21/2020    Years since quitting: 2.8    Passive exposure: Never   Smokeless tobacco: Former    Types: Chew    Quit date: 1990  Vaping Use   Vaping status: Never Used  Substance and Sexual Activity   Alcohol use: Yes    Comment: rare   Drug use: Yes    Frequency: 1.0 times per week    Types: Marijuana    Comment: Gummies - has had in last 24 hours per patient   Sexual  activity: Not Currently  Other Topics Concern   Not on file  Social History Narrative   Divorced, 2 daughters   Works in [employer] for [employer]   1 alcoholic drink a day smokes cigarettes, uses marijuana, 0-1 caffeinated beverages daily   Social Drivers of Health   Financial Resource Strain: Medium Risk (01/15/2022)   Overall Financial Resource Strain (CARDIA)    Difficulty of Paying Living Expenses: Somewhat hard  Food Insecurity: No Food Insecurity (12/24/2022)   Hunger Vital Sign    Worried About Running Out of Food in the Last Year: Never true    Ran Out of Food in the Last Year: Never true  Recent Concern: Food Insecurity - Food Insecurity Present (12/19/2022)   Hunger Vital Sign    Worried About Running Out of Food in the Last Year: Sometimes true    Ran Out of Food in the Last Year: Sometimes true  Transportation Needs: Unmet Transportation Needs (12/22/2022)   PRAPARE - Transportation    Lack of Transportation (Medical): Yes    Lack of Transportation (Non-Medical): Yes  Physical Activity: Inactive (10/10/2023)   Exercise Vital Sign    Days of Exercise per Week: 0 days    Minutes of Exercise per Session: 0 min  Stress: Stress Concern Present (10/10/2023)   [facility] [employer]ational [employer] - Occupational Stress Questionnaire    Feeling of Stress : Rather much  Social Connections: Socially Isolated (10/10/2023)   Social Connection and Isolation Panel [NHANES]    Frequency of Communication with Friends and Family: Twice a week    Frequency of Social Gatherings with Friends and Family: Never    Attends Religious Services: Never    Database administrator or Organizations: No    Attends Banker Meetings: Never    Marital Status: Divorced  Catering manager Violence: Unknown (12/19/2022)   Humiliation, Afraid, Rape, and Kick questionnaire    Fear of Current or Ex-Partner: No    Emotionally Abused: Not on file    Physically Abused: No    Sexually Abused: No     Family History  Adopted: Yes  Problem Relation Age of Onset   Colon cancer Neg Hx    Stomach cancer Neg Hx    Esophageal cancer Neg Hx    Rectal cancer Neg Hx     Past Surgical History:  Procedure Laterality Date   BIOPSY  10/17/2022   Procedure: BIOPSY;  Surgeon: Melvenia Stabs, MD;  Location: [facility];  Service: General;;   COLONOSCOPY     DIVERTING ILEOSTOMY N/A 09/04/2022   Procedure: LOOP ILEOSTOMY;  Surgeon: Melvenia Stabs, MD;  Location: [facility];  Service: General;  Laterality: N/A;   FLEXIBLE SIGMOIDOSCOPY N/A 09/04/2022   Procedure: FLEXIBLE SIGMOIDOSCOPY;  Surgeon:  Melvenia Stabs, MD;  Location: [facility];  Service: General;  Laterality: N/A;   FLEXIBLE SIGMOIDOSCOPY N/A 10/17/2022   Procedure: Marlynn Singer;  Surgeon: Melvenia Stabs, MD;  Location: [facility];  Service: General;  Laterality: N/A;   ILEOSTOMY CLOSURE N/A 12/18/2022   Procedure: LOOP ILEOSTOMY REVERSAL;  Surgeon: Melvenia Stabs, MD;  Location: [facility];  Service: General;  Laterality: N/A;   OPERATIVE ULTRASOUND Right 12/17/2021   Procedure: OPERATIVE ULTRASOUND;  Surgeon: Melvenia Stabs, MD;  Location: [facility];  Service: General;  Laterality: Right;   POLYPECTOMY  10/17/2022   Procedure: POLYPECTOMY;  Surgeon: Melvenia Stabs, MD;  Location: [facility];  Service: General;;   PORT-A-CATH REMOVAL N/A 05/20/2022   Procedure: REMOVAL PORT-A-CATH;  Surgeon: Melvenia Stabs, MD;  [facility];  Service: General;  Laterality: N/A;   PORTACATH PLACEMENT Right 12/17/2021   Procedure: INSERTION PORT-A-CATH;  Surgeon: Melvenia Stabs, MD;  Location: [facility];  Service: General;  Laterality: Right;   RHINOPLASTY     SEPTOPLASTY     SHOULDER ARTHROSCOPY Left    SHOULDER ARTHROSCOPY WITH OPEN ROTATOR CUFF REPAIR AND DISTAL CLAVICLE ACROMINECTOMY Right 02/01/2021   Procedure: RIGHT SHOULDER ARTHROSCOPY WITH MINI OPEN ROTATOR CUFF  REPAIR AND DISTAL CLAVICLE EXCISION;  Surgeon: Rande Bushy, MD;  Location: [facility];  Service: Orthopedics;  Laterality: Right;   SPINE SURGERY     lumbar laminectomy   WRIST SURGERY Bilateral    corpal tunnel   XI ROBOTIC ASSISTED LOWER ANTERIOR RESECTION N/A 09/04/2022   Procedure: ROBOTIC LOW ANTERIOR RESECTION, BILATERAL TAP BLOCK, AND INOPERATIVE ASSESSMENT OF PERFUSION USING FIREFLY DYE;  Surgeon: Melvenia Stabs, MD;  Location: [facility];  Service: General;  Laterality: N/A;    ROS: Review of Systems Negative except as stated above  PHYSICAL EXAM: BP 128/75   Pulse 68   Temp 98 F (36.7 C) (Oral)   Resp 18   Ht 6\' 1"  (1.854 m)   Wt 239 lb 6.4 oz (108.6 kg)   SpO2 96%   BMI 31.59 kg/m   Physical Exam HENT:     Head: Normocephalic and atraumatic.     Nose: Nose normal.     Mouth/Throat:     Mouth: Mucous membranes are moist.     Pharynx: Oropharynx is clear.  Eyes:     Extraocular Movements: Extraocular movements intact.     Conjunctiva/sclera: Conjunctivae normal.     Pupils: Pupils are equal, round, and reactive to light.  Cardiovascular:     Rate and Rhythm: Normal rate and regular rhythm.     Pulses: Normal pulses.     Heart sounds: Normal heart sounds.  Pulmonary:     Effort: Pulmonary effort is normal.     Breath sounds: Normal breath sounds.  Musculoskeletal:        General: Normal range of motion.     Cervical back: Normal range of motion and neck supple.  Neurological:     General: No focal deficit present.     Mental Status: He is alert and oriented to person, place, and time.  Psychiatric:        Mood and Affect: Mood normal.        Behavior: Behavior normal.    ASSESSMENT AND PLAN: 1. Erectile dysfunction, unspecified erectile dysfunction type (Primary) - Tadalafil as prescribed. Counseled on medication adherence/adverse effects. - Follow-up with primary provider as scheduled. - tadalafil (CIALIS) 5 MG tablet; Take 1 tablet 1/2  hour to  1 hour prior to intercourse as needed. Limit use to 1/2 tablet or 1 tablet per 24 hours.  Dispense: 30 tablet; Refill: 2   Patient was given the opportunity to ask questions.  Patient verbalized understanding of the plan and was able to repeat key elements of the plan. Patient was given clear instructions to go to Emergency Department or return to medical center if symptoms don't improve, worsen, or new problems develop.The patient verbalized understanding.   Requested Prescriptions   Signed Prescriptions Disp Refills   tadalafil (CIALIS) 5 MG tablet 30 tablet 2    Sig: Take 1 tablet 1/2 hour to 1 hour prior to intercourse as needed. Limit use to 1/2 tablet or 1 tablet per 24 hours.    Follow-up with primary provider as scheduled.  Senaida Dama, NP

## 2023-10-10 NOTE — Addendum Note (Signed)
 Addended by: Donnelly Gainer on: 10/10/2023 02:48 PM   Modules accepted: Level of Service

## 2023-10-10 NOTE — Progress Notes (Signed)
 Trouble with COPD, medication causing sexual dysfunctions,

## 2023-10-13 ENCOUNTER — Other Ambulatory Visit: Payer: Self-pay

## 2023-10-14 ENCOUNTER — Encounter (HOSPITAL_BASED_OUTPATIENT_CLINIC_OR_DEPARTMENT_OTHER): Payer: Self-pay

## 2023-10-14 ENCOUNTER — Encounter: Payer: Self-pay | Admitting: Neurology

## 2023-10-14 ENCOUNTER — Ambulatory Visit: Payer: 59 | Admitting: Neurology

## 2023-10-14 VITALS — BP 143/85 | HR 70 | Ht 73.0 in | Wt 239.4 lb

## 2023-10-14 DIAGNOSIS — G629 Polyneuropathy, unspecified: Secondary | ICD-10-CM | POA: Insufficient documentation

## 2023-10-14 DIAGNOSIS — R251 Tremor, unspecified: Secondary | ICD-10-CM | POA: Diagnosis not present

## 2023-10-14 DIAGNOSIS — R4189 Other symptoms and signs involving cognitive functions and awareness: Secondary | ICD-10-CM

## 2023-10-14 DIAGNOSIS — G6289 Other specified polyneuropathies: Secondary | ICD-10-CM

## 2023-10-14 DIAGNOSIS — F32A Depression, unspecified: Secondary | ICD-10-CM

## 2023-10-14 DIAGNOSIS — F419 Anxiety disorder, unspecified: Secondary | ICD-10-CM

## 2023-10-14 MED ORDER — GABAPENTIN 300 MG PO CAPS: 300.0000 mg | ORAL_CAPSULE | Freq: Three times a day (TID) | ORAL | 11 refills | Status: AC | PRN

## 2023-10-14 NOTE — Progress Notes (Signed)
 ASSESSMENT AND PLAN 63 y.o. year old male   1.  Bilateral hand action tremor  Symmetric, denied family history, mostly posturing tremor, no parkinsonian features 2.  Memory loss 3.  Depression 4.  History of rectal adenocarcinoma, underwent radiation/chemotherapy, status resection in March 2024  5. Anxiety 6.  Peripheral neuropathy  Developed following his previous chemotherapy, relative stable  Gabapentin  as needed works well, refill his prescription,  Will continue refill by his primary care only return to clinic for new issues   DIAGNOSTIC DATA (LABS, IMAGING, TESTING) - I reviewed patient records, labs, notes, testing and imaging myself where available.   HISTORY: Billy Roman is a 63 year old male, seen in request by his primary care physician nurse practitioner Rogerio Clay, Amy for evaluation of bilateral hands tremor, initial evaluation was on September 18, 2021   I reviewed and summarized the referring note. PMHX. Depression Chronic insominia trazodone  for sleep as needed,    Patient works as a Veterinary surgeon support, but recently change to a different job, which has caused a lot of stress on him, during the training in February 2023, he describes difficulty keeping up with the requirement,   This is concurrent with his worsening depression  He suffered depression for few years, especially since he separated from his wife in 2018, complains of worsening depression since 2023  Then he began to notice mild bilateral hands tremor, intermittent, also complains of memory loss, difficulty keeping up with his stroke,   UDS March 2023 was positive for marijuana, oxazepam  He had adenocarcinoma of proximal rectum/distal sigmoid, radiation therapy in Dec 2023, underwent robotic assisted low anterior resection with diverting loop ileostomy in March 2024, recovering well,  Laboratory evaluation in April 2024 RPR, HIV, CK, ANA, sed rate, CRP, MM panel, thyroid   peroxidase antibody, thyroglobulin antibody, drug screen, ethanol were unremarkable with the exception of positive for marijuana  CT head 02/02/22 was unremarkable.  MRI of the brain with and without contrast March 2023 was unremarkable.  UPDATE April 29th 2025: Continue to have tremor, no worsening, bilateral upper and lower extremity numbness following his chemotherapy, radiation therapy treatment for his rectum adenocarcinoma, he no longer have the ileostomy, but has bowel incontinence, urinary urgency, denies significant gait abnormality  He has been taking gabapentin  up to 300 mg 3 times a day as needed for his paresthesia works well, needs refill,   REVIEW OF SYSTEMS: Out of a complete 14 system review of symptoms, the patient complains only of the following symptoms, and all other reviewed systems are negative.  See HPI  PHYSICAL EXAM  Vitals:   12/09/22 1300  BP: 119/76  Pulse: 73  Weight: 256 lb 8 oz (116.3 kg)  Height: 6\' 1"  (1.854 m)   Body mass index is 33.84 kg/m. Gen: NAD, conversant, well nourised, well groomed                     Cardiovascular: Regular rate rhythm, no peripheral edema, warm, nontender. Eyes: Conjunctivae clear without exudates or hemorrhage Neck: Supple, no carotid bruits. Pulmonary: Clear to auscultation bilaterally   Frequent left shoulder shrugging motor tics     10/14/2023    1:43 PM 10/14/2023    1:35 PM 12/09/2022    1:04 PM 03/13/2022    8:22 AM 09/18/2021    2:40 PM  Montreal Cognitive Assessment   Visuospatial/ Executive (0/5) 4 4 3 4 5   Naming (0/3) 3 3 2 3 3   Attention:  Read list of digits (0/2) 2 2 1 1 2   Attention: Read list of letters (0/1) 1 1 0 1 1  Attention: Serial 7 subtraction starting at 100 (0/3) 2 2 0 0 3  Language: Repeat phrase (0/2) 1  1 2 1   Language : Fluency (0/1) 0  0 0 0  Abstraction (0/2) 2  2 2 2   Delayed Recall (0/5) 3  2 0 2  Orientation (0/6) 4 4 5 4 5   Total 22  16 17 24       CRANIAL NERVES: CN II:  Visual fields are full to confrontation.  Pupils are round equal and briskly reactive to light. CN III, IV, VI: extraocular movement are normal. No ptosis. CN V: Facial sensation is intact to pinprick in all 3 divisions bilaterally. Corneal responses are intact.  CN VII: Face is symmetric with normal eye closure and smile. CN VIII: Hearing is normal to casual conversation CN IX, X: Palate elevates symmetrically. Phonation is normal. CN XI: Head turning and shoulder shrug are intact CN XII: Tongue is midline with normal movements and no atrophy.  MOTOR:   REFLEXES: Reflexes are hypoactive and symmetric at the biceps, triceps, absent at knees, and ankles. Plantar responses are flexor.  SENSORY: Length-dependent sensory changes  COORDINATION: Rapid alternating movements and fine finger movements are intact. There is no dysmetria on finger-to-nose and heel-knee-shin.    GAIT/STANCE: Posture is normal. Gait is steady     ALLERGIES: Allergies  Allergen Reactions   Omnipaque  [Iohexol ] Nausea And Vomiting    08/28/22- second time vomiting having scan with IV contrast Perfuse vomiting.    Propyphenazone Anaphylaxis, Itching and Rash    Not able to move    HOME MEDICATIONS: Outpatient Medications Prior to Visit  Medication Sig Dispense Refill   gabapentin  (NEURONTIN ) 300 MG capsule Take 1 capsule (300 mg total) by mouth 2 (two) times daily as needed (neuropathy). 60 capsule 11   hydrOXYzine  (ATARAX ) 50 MG tablet Take 1 tablet (50 mg total) by mouth at bedtime as needed (sleep). 30 each 1   methocarbamol  (ROBAXIN ) 500 MG tablet Take 500 mg by mouth every 8 (eight) hours as needed for muscle spasms. (Patient not taking: Reported on 10/07/2023)     OVER THE COUNTER MEDICATION Take 2 capsules by mouth daily. Honeysuckle extract     prochlorperazine  (COMPAZINE ) 10 MG tablet Take 1 tablet (10 mg total) by mouth every 6 (six) hours as needed for nausea or vomiting. 30 tablet 0   sertraline   (ZOLOFT ) 25 MG tablet Take 1 tablet (25 mg total) by mouth daily. Take with 50 mg tablet for a total of 75 mg. 30 tablet 1   sertraline  (ZOLOFT ) 50 MG tablet Take 1 tablet (50 mg total) by mouth daily. Take with 25 mg tablet for a total of 75 mg. 30 tablet 1   tadalafil (CIALIS) 5 MG tablet Take 1 tablet 1/2 hour to 1 hour prior to intercourse as needed. Limit use to 1/2 tablet or 1 tablet per 24 hours. 30 tablet 2   Facility-Administered Medications Prior to Visit  Medication Dose Route Frequency Provider Last Rate Last Admin   0.9 %  sodium chloride  infusion  500 mL Intravenous Once Kenney Peacemaker, MD        PAST MEDICAL HISTORY: Past Medical History:  Diagnosis Date   Anxiety    Arthritis    Complication of anesthesia    Nausea   COPD (chronic obstructive pulmonary disease) (HCC)  Dyspnea    Dysrhythmia    RBBB   [name], recurrent severe, without psychosis (HCC) 08/23/2021   Pneumonia    Rectal cancer (HCC) 11/20/2021   Sleep apnea    PAST SURGICAL HISTORY: Past Surgical History:  Procedure Laterality Date   BIOPSY  10/17/2022   Procedure: BIOPSY;  Surgeon: Melvenia Stabs, MD;  Location: [facility];  Service: General;;   COLONOSCOPY     DIVERTING ILEOSTOMY N/A 09/04/2022   Procedure: LOOP ILEOSTOMY;  Surgeon: Melvenia Stabs, MD;  Location: [facility];  Service: General;  Laterality: N/A;   FLEXIBLE SIGMOIDOSCOPY N/A 09/04/2022   Procedure: FLEXIBLE SIGMOIDOSCOPY;  Surgeon: Melvenia Stabs, MD;  Location: [facility];  Service: General;  Laterality: N/A;   FLEXIBLE SIGMOIDOSCOPY N/A 10/17/2022   Procedure: FLEXIBLE SIGMOIDOSCOPY;  Surgeon: Melvenia Stabs, MD;  Location: [facility];  Service: General;  Laterality: N/A;   ILEOSTOMY CLOSURE N/A 12/18/2022   Procedure: LOOP ILEOSTOMY REVERSAL;  Surgeon: Melvenia Stabs, MD;  Location: [facility];  Service: General;  Laterality: N/A;   OPERATIVE ULTRASOUND Right 12/17/2021    Procedure: OPERATIVE ULTRASOUND;  Surgeon: Melvenia Stabs, MD;  Location: [facility];  Service: General;  Laterality: Right;   POLYPECTOMY  10/17/2022   Procedure: POLYPECTOMY;  Surgeon: Melvenia Stabs, MD;  Location: [facility];  Service: General;;   PORT-A-CATH REMOVAL N/A 05/20/2022   Procedure: REMOVAL PORT-A-CATH;  Surgeon: Melvenia Stabs, MD;  Location: [facility] [place];  Service: General;  Laterality: N/A;   PORTACATH PLACEMENT Right 12/17/2021   Procedure: INSERTION PORT-A-CATH;  Surgeon: Melvenia Stabs, MD;  Location: [facility];  Service: General;  Laterality: Right;   RHINOPLASTY     SEPTOPLASTY     SHOULDER ARTHROSCOPY Left    SHOULDER ARTHROSCOPY WITH OPEN ROTATOR CUFF REPAIR AND DISTAL CLAVICLE ACROMINECTOMY Right 02/01/2021   Procedure: RIGHT SHOULDER ARTHROSCOPY WITH MINI OPEN ROTATOR CUFF REPAIR AND DISTAL CLAVICLE EXCISION;  Surgeon: Rande Bushy, MD;  Location: [facility];  Service: Orthopedics;  Laterality: Right;   SPINE SURGERY     lumbar laminectomy   WRIST SURGERY Bilateral    corpal tunnel   XI ROBOTIC ASSISTED LOWER ANTERIOR RESECTION N/A 09/04/2022   Procedure: ROBOTIC LOW ANTERIOR RESECTION, BILATERAL TAP BLOCK, AND INOPERATIVE ASSESSMENT OF PERFUSION USING FIREFLY DYE;  Surgeon: Melvenia Stabs, MD;  Location: [facility];  Service: General;  Laterality: N/A;    FAMILY HISTORY: Family History  Adopted: Yes  Problem Relation Age of Onset   Colon cancer Neg Hx    Stomach cancer Neg Hx    Esophageal cancer Neg Hx    Rectal cancer Neg Hx     SOCIAL HISTORY: Social History   Socioeconomic History   Marital status: Single    Spouse name: Not on file   Number of children: 2   Years of education: Not on file   Highest education level: Not on file  Occupational History   Occupation: tech support  Tobacco Use   Smoking status: Every Day    Current packs/day: 0.00    Average packs/day: 0.3 packs/day for 30.0 years (7.5 ttl  pk-yrs)    Types: Cigarettes    Start date: 11/22/1990    Last attempt to quit: 11/21/2020    Years since quitting: 2.8    Passive exposure: Never   Smokeless tobacco: Former    Types: Chew    Quit date: 1990  Vaping Use   Vaping status: Never Used  Substance and Sexual Activity   Alcohol use: Yes    Comment: rare   Drug use: Yes    Frequency: 1.0 times per week    Types: Marijuana    Comment: Gummies - has had in last 24 hours per patient   Sexual activity: Not Currently  Other Topics Concern   Not on file  Social History Narrative   Divorced, 2 daughters   Works in [employer] for [employer]   1 alcoholic drink a day smokes cigarettes, uses marijuana, 0-1 caffeinated beverages daily   Social Drivers of Corporate investment banker Strain: Medium Risk (01/15/2022)   Overall Financial Resource Strain (CARDIA)    Difficulty of Paying Living Expenses: Somewhat hard  Food Insecurity: No Food Insecurity (12/24/2022)   Hunger Vital Sign    Worried About Running Out of Food in the Last Year: Never true    Ran Out of Food in the Last Year: Never true  Recent Concern: Food Insecurity - Food Insecurity Present (12/19/2022)   Hunger Vital Sign    Worried About Running Out of Food in the Last Year: Sometimes true    Ran Out of Food in the Last Year: Sometimes true  Transportation Needs: Unmet Transportation Needs (12/22/2022)   PRAPARE - Transportation    Lack of Transportation (Medical): Yes    Lack of Transportation (Non-Medical): Yes  Physical Activity: Inactive (10/10/2023)   Exercise Vital Sign    Days of Exercise per Week: 0 days    Minutes of Exercise per Session: 0 min  Stress: Stress Concern Present (10/10/2023)   [employer] of Occupational Health - Occupational Stress Questionnaire    Feeling of Stress : Rather much  Social Connections: Socially Isolated (10/10/2023)   Social Connection and Isolation Panel [NHANES]    Frequency of Communication with Friends and Family: Twice  a week    Frequency of Social Gatherings with Friends and Family: Never    Attends Religious Services: Never    Database administrator or Organizations: No    Attends Banker Meetings: Never    Marital Status: Divorced  Catering manager Violence: Unknown (12/19/2022)   Humiliation, Afraid, Rape, and Kick questionnaire    Fear of Current or Ex-Partner: No    Emotionally Abused: Not on file    Physically Abused: No    Sexually Abused: No    Phebe Brasil, M.D. Ph.D.  [facility] [address] [place], [place] [address] Phone: [phone] Fax:      [phone]

## 2023-11-02 ENCOUNTER — Other Ambulatory Visit (HOSPITAL_COMMUNITY): Payer: Self-pay | Admitting: Student

## 2023-11-05 ENCOUNTER — Telehealth (HOSPITAL_COMMUNITY): Admitting: Student

## 2023-11-05 DIAGNOSIS — F17200 Nicotine dependence, unspecified, uncomplicated: Secondary | ICD-10-CM

## 2023-11-05 DIAGNOSIS — F17210 Nicotine dependence, cigarettes, uncomplicated: Secondary | ICD-10-CM

## 2023-11-05 DIAGNOSIS — F4312 Post-traumatic stress disorder, chronic: Secondary | ICD-10-CM | POA: Diagnosis not present

## 2023-11-05 DIAGNOSIS — F1290 Cannabis use, unspecified, uncomplicated: Secondary | ICD-10-CM

## 2023-11-05 DIAGNOSIS — F332 Major depressive disorder, recurrent severe without psychotic features: Secondary | ICD-10-CM | POA: Diagnosis not present
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
## 2023-11-14 NOTE — Addendum Note (Signed)
 Addended by: Donnelly Gainer on: 11/14/2023 09:15 AM   Modules accepted: Level of Service

## 2023-11-19 ENCOUNTER — Encounter: Payer: Self-pay | Admitting: Pulmonary Disease

## 2023-11-19 ENCOUNTER — Ambulatory Visit: Admitting: Pulmonary Disease

## 2023-11-19 VITALS — BP 126/80 | HR 69 | Temp 98.2°F | Ht 73.0 in | Wt 232.6 lb

## 2023-11-19 DIAGNOSIS — J441 Chronic obstructive pulmonary disease with (acute) exacerbation: Secondary | ICD-10-CM

## 2023-11-19 DIAGNOSIS — R0602 Shortness of breath: Secondary | ICD-10-CM

## 2023-11-19 DIAGNOSIS — C20 Malignant neoplasm of rectum: Secondary | ICD-10-CM | POA: Diagnosis not present

## 2023-11-19 DIAGNOSIS — F17210 Nicotine dependence, cigarettes, uncomplicated: Secondary | ICD-10-CM

## 2023-11-19 DIAGNOSIS — Z9189 Other specified personal risk factors, not elsewhere classified: Secondary | ICD-10-CM

## 2023-11-19 DIAGNOSIS — J9801 Acute bronchospasm: Secondary | ICD-10-CM

## 2023-11-19 DIAGNOSIS — J449 Chronic obstructive pulmonary disease, unspecified: Secondary | ICD-10-CM

## 2023-11-19 MED ORDER — ALBUTEROL SULFATE (2.5 MG/3ML) 0.083% IN NEBU: 2.5000 mg | INHALATION_SOLUTION | Freq: Four times a day (QID) | RESPIRATORY_TRACT | 12 refills | Status: AC | PRN

## 2023-11-19 MED ORDER — TRELEGY ELLIPTA 100-62.5-25 MCG/ACT IN AEPB: 1.0000 | INHALATION_SPRAY | Freq: Every day | RESPIRATORY_TRACT | Status: DC

## 2023-11-19 NOTE — Patient Instructions (Signed)
 VISIT SUMMARY:  Today, you were seen for difficulty breathing, which has been particularly troublesome when walking long distances. You have a significant smoking history and have experienced interrupted sleep for years. We discussed your breathing issues, potential COPD, nicotine  dependence, and mild sleep apnea.  YOUR PLAN:  -CHRONIC OBSTRUCTIVE PULMONARY DISEASE (COPD): COPD is a chronic lung disease that makes it hard to breathe, often caused by smoking. We discussed starting a maintenance inhaler, either Trelegy 1 puff daily, to help control your symptoms. We also administered a nebulizer treatment today to help with your wheezing and scheduled pulmonary function tests to better understand your lung function.  -NICOTINE  DEPENDENCE: Nicotine  dependence is an addiction to tobacco products. We talked about the importance of quitting smoking to improve your COPD. We will work on smoking cessation strategies to help you quit.  -SLEEP APNEA, MILD: Sleep apnea is a condition where your breathing stops and starts during sleep. Your previous sleep study showed mild sleep apnea. We discussed the importance of sleep position and weight loss in managing your symptoms. We also talked about further evaluation with a home sleep study to get a more up-to-date diagnosis.  INSTRUCTIONS:  Please follow up with the pulmonary function tests as scheduled. Consider the smoking cessation strategies we discussed to help quit smoking. For your sleep apnea, decide whether you prefer a home sleep study or an in-lab sleep study for further evaluation. Continue to monitor your sleep position and work on weight loss as discussed.

## 2023-11-19 NOTE — Progress Notes (Addendum)
 Subjective:    Patient ID: Billy Roman, male    DOB: March 08, 1961, 63 y.o.   MRN: [id number]  Patient Care Team: Senaida Dama, NP as PCP - General (Nurse Practitioner) Rosslyn Coons, RN as Oncology Nurse Navigator Melvenia Stabs, MD as Consulting Physician (General Surgery) Kenney Peacemaker, MD as Consulting Physician (Gastroenterology) Sumner Ends, MD as Consulting Physician (Oncology) Johna Myers, MD as Consulting Physician (Radiation Oncology)  Chief Complaint  Patient presents with   Consult    Cough, shortness of breath and wheezing.     BACKGROUND: Mr. Hosking is a 63 year old current smoker (1 PPD) with a history of rectal cancer in remission who presents for evaluation of shortness of breath with suspected COPD.  He is kindly referred by Dr. Coni Deep, MD, his primary care provider is Amy Stephens,NP.  HPI Discussed the use of AI scribe software for clinical note transcription with the patient, who gave verbal consent to proceed.  History of Present Illness   Billy Roman is a 63 year old male who presents with difficulty breathing. He was referred by his oncologist, Dr. Anise Kerns, for evaluation of his breathing difficulties and potential COPD.  He experiences difficulty breathing, particularly when walking long distances, which has led to obtaining a handicap sticker. He has not had a formal breathing test that he recalls.  He has a significant smoking history, currently smoking about half a pack to a pack a day, and has been smoking for decades. He previously quit for three years using nicotine  lozenges but resumed smoking about six years ago due to personal stressors, including a divorce. He has attempted to cut back at times.  His sleep has been interrupted for years, partly due to trauma and partly due to waking up feeling choked and needing to cough. A sleep study conducted in 2016 indicated he stopped breathing five times an  hour, which was not enough to qualify for a CPAP machine. He sleeps with his head elevated and sometimes wakes up feeling choked.  He uses an albuterol inhaler and has a nebulizer, although he has not renewed his prescription for the nebulizer medication in years. He experiences worsening symptoms with chest infections.  He worked in a [employer] for eight years, which involved exposure to small particle processed woods, leading to coughing at night and tasting the particles. He has no history of [employer].  He mentions having scar tissue on his left side from a scuba diving incident, which affects his breathing, and he finds it easier to breathe when lying on his side.  He uses a positional bed with head and lower extremities elevated during sleep.  He lives in [place] and used to work at [employer] on [address]. He has a daughter with young children and prefers to be at home at night to assist if needed.  He rarely experiences lower extremity edema.  He does not endorse any fevers, chills or sweats.  Cough has been mostly nonproductive but occasionally feels congested.  No hemoptysis.     Review of Systems A 10 point review of systems was performed and it is as noted above otherwise negative.   Past Medical History:  Diagnosis Date   Anxiety    Arthritis    Complication of anesthesia    Nausea   COPD (chronic obstructive pulmonary disease) (HCC)    Dyspnea    Dysrhythmia    RBBB   [name], recurrent severe,  without psychosis (HCC) 08/23/2021   Pneumonia    Rectal cancer (HCC) 11/20/2021   Sleep apnea     Past Surgical History:  Procedure Laterality Date   BIOPSY  10/17/2022   Procedure: BIOPSY;  Surgeon: Melvenia Stabs, MD;  Location: [facility];  Service: General;;   COLONOSCOPY     DIVERTING ILEOSTOMY N/A 09/04/2022   Procedure: LOOP ILEOSTOMY;  Surgeon: Melvenia Stabs, MD;  Location: [facility];  Service: General;  Laterality: N/A;    FLEXIBLE SIGMOIDOSCOPY N/A 09/04/2022   Procedure: FLEXIBLE SIGMOIDOSCOPY;  Surgeon: Melvenia Stabs, MD;  Location: [facility];  Service: General;  Laterality: N/A;   FLEXIBLE SIGMOIDOSCOPY N/A 10/17/2022   Procedure: FLEXIBLE SIGMOIDOSCOPY;  Surgeon: Melvenia Stabs, MD;  Location: [facility];  Service: General;  Laterality: N/A;   ILEOSTOMY CLOSURE N/A 12/18/2022   Procedure: LOOP ILEOSTOMY REVERSAL;  Surgeon: Melvenia Stabs, MD;  Location: [facility];  Service: General;  Laterality: N/A;   OPERATIVE ULTRASOUND Right 12/17/2021   Procedure: OPERATIVE ULTRASOUND;  Surgeon: Melvenia Stabs, MD;  Location: [facility];  Service: General;  Laterality: Right;   POLYPECTOMY  10/17/2022   Procedure: POLYPECTOMY;  Surgeon: Melvenia Stabs, MD;  Location: [facility];  Service: General;;   PORT-A-CATH REMOVAL N/A 05/20/2022   Procedure: REMOVAL PORT-A-CATH;  Surgeon: Melvenia Stabs, MD;  Location[facility];  Service: General;  Laterality: N/A;   PORTACATH PLACEMENT Right 12/17/2021   Procedure: INSERTION PORT-A-CATH;  Surgeon: Melvenia Stabs, MD;  Location: [facility];  Service: General;  Laterality: Right;   RHINOPLASTY     SEPTOPLASTY     SHOULDER ARTHROSCOPY Left    SHOULDER ARTHROSCOPY WITH OPEN ROTATOR CUFF REPAIR AND DISTAL CLAVICLE ACROMINECTOMY Right 02/01/2021   Procedure: RIGHT SHOULDER ARTHROSCOPY WITH MINI OPEN ROTATOR CUFF REPAIR AND DISTAL CLAVICLE EXCISION;  Surgeon: Rande Bushy, MD;  Location: [facility];  Service: Orthopedics;  Laterality: Right;   SPINE SURGERY     lumbar laminectomy   WRIST SURGERY Bilateral    corpal tunnel   XI ROBOTIC ASSISTED LOWER ANTERIOR RESECTION N/A 09/04/2022   Procedure: ROBOTIC LOW ANTERIOR RESECTION, BILATERAL TAP BLOCK, AND INOPERATIVE ASSESSMENT OF PERFUSION USING FIREFLY DYE;  Surgeon: Melvenia Stabs, MD;  Location: [facility];  Service: General;  Laterality: N/A;    Patient Active Problem List    Diagnosis Date Noted   Peripheral neuropathy 10/14/2023   Chronic post-traumatic stress disorder (PTSD) 08/04/2023   Cannabis use, uncomplicated 08/04/2023   Prediabetes 02/19/2023   S/P closure of ileostomy 12/18/2022   Essential tremor 12/09/2022   Irritant contact dermatitis associated with fecal stoma 10/13/2022   Right inguinal hernia 09/05/2022   Insomnia 09/05/2022   Chronic pain 09/05/2022   S/P robot-assisted surgical procedure 09/04/2022   Ileostomy care (HCC) 09/04/2022   Rectal cancer (HCC) 11/20/2021   Cognitive impairment 09/18/2021   Confusion 09/18/2021   Tremor 09/18/2021   Memory loss 08/24/2021   Tremor of both hands 08/24/2021   H/O rotator cuff surgery 08/24/2021   History of COVID-19 08/24/2021   Nicotine  use disorder 08/24/2021   [name], recurrent severe, without psychosis (HCC) 08/23/2021   Stiffness of right shoulder joint 02/09/2021   Pain in joint of right shoulder 02/09/2021   HNP (herniated nucleus pulposus), lumbar 01/15/2021   Sciatica 01/15/2021   Anxiety and depression 08/23/2009    Family History  Adopted: Yes  Problem Relation Age of Onset   Colon cancer Neg Hx  Stomach cancer Neg Hx    Esophageal cancer Neg Hx    Rectal cancer Neg Hx     Social History   Tobacco Use   Smoking status: Every Day    Current packs/day: 1.00    Average packs/day: 1 pack/day for 45.4 years (45.4 ttl pk-yrs)    Types: Cigarettes    Start date: 1980    Passive exposure: Never   Smokeless tobacco: Former    Types: Chew    Quit date: 1990  Substance Use Topics   Alcohol use: Yes    Comment: rare    Allergies  Allergen Reactions   Omnipaque  [Iohexol ] Nausea And Vomiting    08/28/22- second time vomiting having scan with IV contrast Perfuse vomiting.    Propyphenazone Anaphylaxis, Itching and Rash    Not able to move    Current Meds  Medication Sig   albuterol (PROVENTIL) (2.5 MG/3ML) 0.083% nebulizer solution Take 3  mLs (2.5 mg total) by nebulization every 6 (six) hours as needed for wheezing or shortness of breath.   gabapentin  (NEURONTIN ) 300 MG capsule Take 1 capsule (300 mg total) by mouth 3 (three) times daily as needed (neuropathy).   hydrOXYzine  (ATARAX ) 50 MG tablet Take 1 tablet (50 mg total) by mouth at bedtime as needed (sleep).   methocarbamol  (ROBAXIN ) 500 MG tablet Take 500 mg by mouth every 8 (eight) hours as needed for muscle spasms.   OVER THE COUNTER MEDICATION Take 2 capsules by mouth daily. Honeysuckle extract   prochlorperazine  (COMPAZINE ) 10 MG tablet Take 1 tablet (10 mg total) by mouth every 6 (six) hours as needed for nausea or vomiting.   sertraline  (ZOLOFT ) 25 MG tablet Take 1 tablet (25 mg total) by mouth daily. Take with 50 mg tablet for a total of 75 mg.   sertraline  (ZOLOFT ) 50 MG tablet Take 1 tablet (50 mg total) by mouth daily. Take with 25 mg tablet for a total of 75 mg.   tadalafil  (CIALIS ) 5 MG tablet Take 1 tablet 1/2 hour to 1 hour prior to intercourse as needed. Limit use to 1/2 tablet or 1 tablet per 24 hours.   Current Facility-Administered Medications for the 11/19/23 encounter (Office Visit) with Marc Senior, MD  Medication   0.9 %  sodium chloride  infusion    Immunization History  Administered Date(s) Administered   Influenza, Seasonal, Injecte, Preservative Fre 04/08/2023   Influenza,inj,Quad PF,6+ Mos 07/26/2019, 03/14/2021, 05/07/2022   Pneumococcal Polysaccharide-23 08/24/2021   Tdap 01/15/2021   Zoster Recombinant(Shingrix) 03/23/2021, 06/04/2021        Objective:     BP 126/80 (BP Location: Left Arm, Patient Position: Sitting, Cuff Size: Normal)   Pulse 69   Temp 98.2 F (36.8 C) (Oral)   Ht 6\' 1"  (1.854 m)   Wt 232 lb 9.6 oz (105.5 kg)   SpO2 97%   BMI 30.69 kg/m   SpO2: 97 %  GENERAL: Well-developed, overweight gentleman, no acute distress, fully ambulatory, no conversational dyspnea. HEAD: Normocephalic, atraumatic.  EYES:  Pupils equal, round, reactive to light.  No scleral icterus.  MOUTH: Dentition intact, teeth, oral mucosa moist, no thrush. NECK: Supple. No thyromegaly. Trachea midline. No JVD.  No adenopathy. PULMONARY: Poor air entry bilaterally.  Scattered wheezes and rhonchi noted. CARDIOVASCULAR: S1 and S2. Regular rate and rhythm.  ABDOMEN: Scars from prior colectomy and ostomy present.  These are well-healed.  Nondistended, otherwise benign. MUSCULOSKELETAL: No joint deformity, no clubbing, no edema.  NEUROLOGIC: No overt focal deficit,  no gait disturbance, speech is fluent. SKIN: Intact,warm,dry. PSYCH: Mood and behavior normal  Patient received nebulization treatment with DuoNeb x 1: After treatment patient bronchospasm cleared, patient reported relief of dyspnea.       11/19/2023    9:07 AM  Results of the Epworth flowsheet  Sitting and reading 2  Watching TV 2  Sitting, inactive in a public place (e.g. a theatre or a meeting) 2  As a passenger in a car for an hour without a break 1  Lying down to rest in the afternoon when circumstances permit 3  Sitting and talking to someone 1  Sitting quietly after a lunch without alcohol 2  In a car, while stopped for a few minutes in traffic 1  Total score 14     Assessment & Plan:     ICD-10-CM   1. COPD suggested by initial evaluation (HCC)  J44.9 Pulmonary function test    2. Bronchospasm  J98.01     3. Rectal cancer (HCC)  C20     4. At risk for sleep apnea  Z91.89 Home sleep test    5. Shortness of breath  R06.02 Pulmonary function test      Orders Placed This Encounter  Procedures   Pulmonary function test    Standing Status:   Future    Expected Date:   12/03/2023    Expiration Date:   11/18/2024    Where should this test be performed?:   Outpatient Pulmonary    What type of PFT is being ordered?:   Full PFT   Home sleep test    Snap    Standing Status:   Future    Expected Date:   12/03/2023    Expiration Date:   11/18/2024     Where should this test be performed::   [facility] - [facility]    Meds ordered this encounter  Medications   albuterol (PROVENTIL) (2.5 MG/3ML) 0.083% nebulizer solution    Sig: Take 3 mLs (2.5 mg total) by nebulization every 6 (six) hours as needed for wheezing or shortness of breath.    Dispense:  75 mL    Refill:  12   Discussion:    Chronic Obstructive Pulmonary Disease (COPD) COPD with wheezing and dyspnea, exacerbated by smoking. Wheezing improved with nebulizer treatment. Requires maintenance inhaler for better control. Discussed Trelegy (once daily) and Breztri (twice daily), with Breztri potentially offering better control due to twice-daily dosing. - After review of patient's insurance coverage, Trelegy is the preferred medication, samples of Trelegy provided, patient to let us  know if this is effective - Continue as needed albuterol - Administer nebulizer treatment for wheezing, patient responded well to DuoNeb x 1 - Schedule pulmonary function tests  Nicotine  dependence Nicotine  dependence with smoking history of half a pack to a pack per day. Previous attempts to quit using nicotine  tablets. Smoking cessation is the best intervention for COPD. - Discuss smoking cessation strategies  Sleep apnea, mild Mild sleep apnea with interrupted sleep and nocturnal choking. Previous home sleep study showed five episodes of apnea per hour, not qualifying for CPAP. Recommended sleep position and weight loss. Discussed further evaluation with either a home sleep study or an in-lab sleep study, noting home studies can misjudge by about 20%. - Offer home sleep study or in-lab sleep study, patient elected for repeat home sleep study - Discuss sleep position and weight loss as management strategies  - Epworth 14    Advised if symptoms do not improve  or worsen, to please contact office for sooner follow up or seek emergency care.    I spent 61 minutes of dedicated to the care of this patient on the  date of this encounter to include pre-visit review of records, face-to-face time with the patient discussing conditions above, post visit ordering of testing, clinical documentation with the electronic health record, making appropriate referrals as documented, and communicating necessary findings to members of the patients care team.   C. Chloe Counter, MD Advanced Bronchoscopy PCCM [facility]    *This note was dictated using voice recognition software/Dragon.  Despite best efforts to proofread, errors can occur which can change the meaning. Any transcriptional errors that result from this process are unintentional and may not be fully corrected at the time of dictation.

## 2023-12-03 ENCOUNTER — Telehealth (HOSPITAL_COMMUNITY): Admitting: Student

## 2023-12-03 DIAGNOSIS — F17210 Nicotine dependence, cigarettes, uncomplicated: Secondary | ICD-10-CM

## 2023-12-03 DIAGNOSIS — F330 Major depressive disorder, recurrent, mild: Secondary | ICD-10-CM

## 2023-12-03 DIAGNOSIS — F4312 Post-traumatic stress disorder, chronic: Secondary | ICD-10-CM | POA: Diagnosis not present

## 2023-12-03 DIAGNOSIS — F17200 Nicotine dependence, unspecified, uncomplicated: Secondary | ICD-10-CM

## 2023-12-03 DIAGNOSIS — F1290 Cannabis use, unspecified, uncomplicated: Secondary | ICD-10-CM

## 2023-12-03 MED ORDER — SERTRALINE HCL 25 MG PO TABS: 25.0000 mg | ORAL_TABLET | Freq: Every day | ORAL | 0 refills | Status: AC

## 2023-12-03 MED ORDER — HYDROXYZINE HCL 50 MG PO TABS: 50.0000 mg | ORAL_TABLET | Freq: Every evening | ORAL | 0 refills | Status: AC | PRN

## 2023-12-03 MED ORDER — SERTRALINE HCL 50 MG PO TABS: 50.0000 mg | ORAL_TABLET | Freq: Every day | ORAL | 0 refills | Status: AC
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
## 2023-12-04 ENCOUNTER — Encounter

## 2023-12-04 DIAGNOSIS — Z9189 Other specified personal risk factors, not elsewhere classified: Secondary | ICD-10-CM

## 2023-12-08 ENCOUNTER — Encounter (HOSPITAL_COMMUNITY): Payer: Self-pay | Admitting: Student

## 2023-12-08 NOTE — Addendum Note (Signed)
 Addended by: CARVIN CROCK on: 12/08/2023 04:15 PM   Modules accepted: Level of Service

## 2023-12-09 ENCOUNTER — Encounter: Payer: Self-pay | Admitting: *Deleted

## 2023-12-09 NOTE — Progress Notes (Signed)
 Received request for medical records by [employer] [facility] [employer] regarding disability claim signed by M. Leila Lourzi. Faxed request to HIM [phone])

## 2023-12-16 DIAGNOSIS — G4730 Sleep apnea, unspecified: Secondary | ICD-10-CM | POA: Diagnosis not present

## 2023-12-17 ENCOUNTER — Ambulatory Visit: Payer: Self-pay | Admitting: Pulmonary Disease

## 2023-12-17 DIAGNOSIS — G4733 Obstructive sleep apnea (adult) (pediatric): Secondary | ICD-10-CM

## 2023-12-23 NOTE — Telephone Encounter (Signed)
-----   Message from Dedra Sanders sent at 12/17/2023  5:03 PM EDT ----- His sleep study confirms sleep apnea though it is mild.  However, he has very high symptom burden and should be treated with CPAP.  Recommend auto CPAP at 5 to 15 cm of water  pressure, heated  humidity and AirTouch n30i mask or similar.  Please refer to [facility] if possible. ----- Message ----- From: Vannie Donzell RAMAN Sent: 12/16/2023   5:55 PM EDT To: Dedra LITTIE Sanders, MD

## 2023-12-23 NOTE — Telephone Encounter (Signed)
 I have notified the patient. He is okay with us  placing the order for the CPAP. He is aware that [facility] will reach out to him regarding the CPAP.  Nothing further needed.

## 2023-12-29 ENCOUNTER — Telehealth: Payer: Self-pay | Admitting: *Deleted

## 2023-12-29 NOTE — Telephone Encounter (Signed)
 Daughter called asking for Dalan to be seen again. Since May he has had frequent nausea, appetite since has decreased significantly to maybe 25% of normal. He has also lost 40 lb since April without trying. Stools are more loose (no blood) and feels extreme fatigue. She is concerned his cancer may have returned.

## 2023-12-30 ENCOUNTER — Encounter: Payer: Self-pay | Admitting: Oncology

## 2023-12-30 NOTE — Telephone Encounter (Signed)
 Called daughter with appointment w/Dr. Cloretta for 7/22 at 2 pm.
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
## 2024-01-02 ENCOUNTER — Encounter: Payer: Self-pay | Admitting: Advanced Practice Midwife

## 2024-01-05 ENCOUNTER — Ambulatory Visit (HOSPITAL_COMMUNITY): Admitting: Psychiatry

## 2024-01-06 ENCOUNTER — Inpatient Hospital Stay: Attending: Oncology | Admitting: Oncology

## 2024-01-06 VITALS — BP 135/72 | HR 80 | Temp 98.1°F | Resp 18 | Ht 73.0 in | Wt 232.3 lb

## 2024-01-06 DIAGNOSIS — J449 Chronic obstructive pulmonary disease, unspecified: Secondary | ICD-10-CM | POA: Diagnosis not present

## 2024-01-06 DIAGNOSIS — R634 Abnormal weight loss: Secondary | ICD-10-CM | POA: Diagnosis not present

## 2024-01-06 DIAGNOSIS — G4730 Sleep apnea, unspecified: Secondary | ICD-10-CM | POA: Diagnosis not present

## 2024-01-06 DIAGNOSIS — Z125 Encounter for screening for malignant neoplasm of prostate: Secondary | ICD-10-CM | POA: Diagnosis not present

## 2024-01-06 DIAGNOSIS — R3915 Urgency of urination: Secondary | ICD-10-CM | POA: Diagnosis not present

## 2024-01-06 DIAGNOSIS — Z85048 Personal history of other malignant neoplasm of rectum, rectosigmoid junction, and anus: Secondary | ICD-10-CM | POA: Diagnosis present

## 2024-01-06 DIAGNOSIS — R159 Full incontinence of feces: Secondary | ICD-10-CM | POA: Diagnosis not present

## 2024-01-06 DIAGNOSIS — F32A Depression, unspecified: Secondary | ICD-10-CM | POA: Insufficient documentation

## 2024-01-06 DIAGNOSIS — Z08 Encounter for follow-up examination after completed treatment for malignant neoplasm: Secondary | ICD-10-CM | POA: Insufficient documentation

## 2024-01-06 DIAGNOSIS — G629 Polyneuropathy, unspecified: Secondary | ICD-10-CM | POA: Diagnosis not present

## 2024-01-06 NOTE — Progress Notes (Signed)
 [facility] OFFICE PROGRESS NOTE   Diagnosis: Rectal cancer  INTERVAL HISTORY:   [name] returns prior to a scheduled visit.  He reports a decreased appetite for approximately 4 months.  He has lost weight.  He has irregular bowel habits with occasional fecal incontinence.  He has urinary urgency and hesitancy.  Stable neuropathy symptoms in the extremities.  He reports being diagnosed with sleep apnea and COPD.  He was evaluated by pulmonary medicine.  Objective:  Vital signs in last 24 hours:  Blood pressure 135/72, pulse 80, temperature 98.1 F (36.7 C), temperature source Temporal, resp. rate 18, height 6' 1 (1.854 m), weight 232 lb 4.8 oz (105.4 kg), SpO2 98%.   Lymphatics: No cervical, supraclavicular, axillary, or inguinal nodes Resp: Lungs clear bilaterally, no respiratory distress Cardio: Regular rate and rhythm GI: No hepatosplenomegaly, nontender, no mass, no apparent ascites Vascular: No leg edema    Lab Results:  Lab Results  Component Value Date   WBC 8.0 12/20/2022   HGB 13.4 12/20/2022   HCT 40.6 12/20/2022   MCV 92.3 12/20/2022   PLT 203 12/20/2022   NEUTROABS 2.1 08/27/2022    CMP  Lab Results  Component Value Date   NA 138 04/01/2023   K 4.1 04/01/2023   CL 103 04/01/2023   CO2 26 04/01/2023   GLUCOSE 131 (H) 04/01/2023   BUN 16 04/01/2023   CREATININE 0.71 04/01/2023   CALCIUM  9.9 04/01/2023   PROT 7.4 08/27/2022   ALBUMIN  4.1 08/27/2022   AST 32 08/27/2022   ALT 32 08/27/2022   ALKPHOS 55 08/27/2022   BILITOT 0.6 08/27/2022   GFRNONAA >60 04/01/2023   GFRAA 117 07/26/2019    Lab Results  Component Value Date   CEA 3.61 10/07/2023      Medications: I have reviewed the patient's current medications.   Assessment/Plan: Adenocarcinoma of the proximal rectum/distal sigmoid Colonoscopy 11/19/2021-partially obstructing mass at 12-19 cm, biopsy moderate to poorly differentiated adenocarcinoma, mismatch repair  protein expression intact Mildly elevated CEA CTs 11/29/2021-mural thickening in the distal sigmoid/proximal rectum with haziness of the mesorectal fat, mesorectal lymphadenopathy, small pulmonary nodules favored to be benign MRI pelvis 12/03/2021-tumor at 10 cm from the anal verge, T3c, approximately 10 metastatic nodes in the mesorectal sheath and presacral space, no extra mesorectal lymphadenopathy, N2 Cycle 1 FOLFOX 12/19/2021 Cycle 2 FOLFOX 01/02/2022, home Decadron  prophylaxis added for delayed nausea Cycle 3 FOLFOX 01/15/2022 Cycle 4 FOLFOX 01/29/2022, 5-FU bolus, leucovorin , and oxaliplatin  dose reduced secondary to neutropenia and thrombocytopenia Cycle 5 FOLFOX 02/12/2022, Udenyca  Cycle 6 FOLFOX 02/26/2022, Udenyca  Cycle 7 FOLFOX 03/12/2022, Udenyca   Cycle 8 FOLFOX 03/26/2022, oxaliplatin  held due to neuropathy, Udenyca  held Radiation/Xeloda  04/22/2022-06/03/2022 CTs 08/28/2022-persistent submucosal thickening through the distal sigmoid colon and rectum, no mass lesion evident, decrease in size of lymph nodes in the mesorectal sheath with remaining enlarged lymph nodes, no evidence of metastatic disease Low anterior resection/diverting loop ileostomy 09/04/2022-no residual tumor, treatment effect present with no viable cancer cells, ypT0, 0/13 nodes, negative resection margins 12/18/2022 loop ileostomy takedown CTs 04/01/2023-no evidence of recurrent disease, stable subcentimeter pulmonary nodules Colonoscopy 04/28/2023: No polyps ,fair to adequate prep, 1 year surveillance colonoscopy recommended 2.   Major depressive disorder Change in bowel habits and rectal bleeding secondary to #1 Ongoing tobacco use Report of blurred vision and diplopia Peripheral neuropathy-fingers and left foot       Disposition: [name] has a history of rectal cancer.  He is in clinical remission.  He has  multiple comorbid conditions including depression and COPD.  He has lost approximately 10 pounds over the  past 3 months.  The etiology of the weight loss is unclear.  I have a low clinical suspicion for recurrent rectal cancer.  Weight loss could be related to depression.  He is scheduled to follow-up with his primary provider later this week.  [name] is scheduled for a restaging evaluation in October.  He will return for an office visit then.  I am available to see him sooner as needed.  I offered him an earlier appointment, but he would like to wait until October.  He requested we check the PSA in October.  I encouraged [name] to increase his diet.    Billy Hof, MD  01/06/2024  2:19 PM

## 2024-01-07 ENCOUNTER — Other Ambulatory Visit: Payer: Self-pay

## 2024-01-09 ENCOUNTER — Ambulatory Visit: Admitting: Family

## 2024-01-09 ENCOUNTER — Encounter: Payer: Self-pay | Admitting: Family

## 2024-01-09 VITALS — BP 131/71 | HR 68 | Temp 97.6°F | Resp 16 | Ht 73.0 in | Wt 232.0 lb

## 2024-01-09 DIAGNOSIS — N529 Male erectile dysfunction, unspecified: Secondary | ICD-10-CM | POA: Diagnosis not present

## 2024-01-09 DIAGNOSIS — M25521 Pain in right elbow: Secondary | ICD-10-CM

## 2024-01-09 DIAGNOSIS — M545 Low back pain, unspecified: Secondary | ICD-10-CM

## 2024-01-09 MED ORDER — TADALAFIL 5 MG PO TABS: ORAL_TABLET | ORAL | 0 refills | Status: AC

## 2024-01-09 MED ORDER — PREDNISONE 10 MG PO TABS: ORAL_TABLET | ORAL | 0 refills | Status: AC

## 2024-01-09 NOTE — Progress Notes (Signed)
 3 month follow up, patient scored a 11 on the PHQ-9,

## 2024-01-09 NOTE — Progress Notes (Signed)
 Patient ID: Billy Roman, male    DOB: [birth year]  MRN: [id number]  CC: Chronic Conditions Follow-Up  Subjective: Billy Roman is a 63 y.o. male who presents for chronic conditions follow-up.   His concerns today include:  - Doing well on Tadalafil , no issues/concerns. - States he recently fell after tripping over his dog. States right elbow pain and lower back pain. Denies red flag symptoms. States he did not hit his head or lose consciousness.  - Anxiety depression. Established with Psychiatry. He denies thoughts of self-harm, suicidal ideations, homicidal ideations.  Patient Active Problem List   Diagnosis Date Noted   Peripheral neuropathy 10/14/2023   Chronic post-traumatic stress disorder (PTSD) 08/04/2023   Cannabis use, uncomplicated 08/04/2023   Prediabetes 02/19/2023   S/P closure of ileostomy 12/18/2022   Essential tremor 12/09/2022   Irritant contact dermatitis associated with fecal stoma 10/13/2022   Right inguinal hernia 09/05/2022   Insomnia 09/05/2022   Chronic pain 09/05/2022   S/P robot-assisted surgical procedure 09/04/2022   Ileostomy care (HCC) 09/04/2022   Rectal cancer (HCC) 11/20/2021   Cognitive impairment 09/18/2021   Confusion 09/18/2021   Tremor 09/18/2021   Memory loss 08/24/2021   Tremor of both hands 08/24/2021   H/O rotator cuff surgery 08/24/2021   History of COVID-19 08/24/2021   Nicotine  use disorder 08/24/2021   [name], recurrent severe, without psychosis (HCC) 08/23/2021   Stiffness of right shoulder joint 02/09/2021   Pain in joint of right shoulder 02/09/2021   HNP (herniated nucleus pulposus), lumbar 01/15/2021   Sciatica 01/15/2021   Anxiety and depression 08/23/2009     Current Outpatient Medications on File Prior to Visit  Medication Sig Dispense Refill   albuterol  (PROVENTIL ) (2.5 MG/3ML) 0.083% nebulizer solution Take 3 mLs (2.5 mg total) by nebulization every 6 (six) hours as needed for  wheezing or shortness of breath. 75 mL 12   Fluticasone -Umeclidin-Vilant (TRELEGY ELLIPTA ) 100-62.5-25 MCG/ACT AEPB Inhale 1 puff into the lungs daily in the afternoon.     gabapentin  (NEURONTIN ) 300 MG capsule Take 1 capsule (300 mg total) by mouth 3 (three) times daily as needed (neuropathy). 90 capsule 11   hydrOXYzine  (ATARAX ) 50 MG tablet Take 1 tablet (50 mg total) by mouth at bedtime as needed (sleep). 90 tablet 0   methocarbamol  (ROBAXIN ) 500 MG tablet Take 500 mg by mouth every 8 (eight) hours as needed for muscle spasms.     prochlorperazine  (COMPAZINE ) 10 MG tablet Take 1 tablet (10 mg total) by mouth every 6 (six) hours as needed for nausea or vomiting. 30 tablet 0   sertraline  (ZOLOFT ) 25 MG tablet Take 1 tablet (25 mg total) by mouth daily. Take with 50 mg tablet for a total of 75 mg. 90 tablet 0   sertraline  (ZOLOFT ) 50 MG tablet Take 1 tablet (50 mg total) by mouth daily. Take with 25 mg tablet for a total of 75 mg. 90 tablet 0   Current Facility-Administered Medications on File Prior to Visit  Medication Dose Route Frequency Provider Last Rate Last Admin   0.9 %  sodium chloride  infusion  500 mL Intravenous Once Avram Lupita BRAVO, MD        Allergies  Allergen Reactions   Omnipaque  [Iohexol ] Nausea And Vomiting    08/28/22- second time vomiting having scan with IV contrast Perfuse vomiting.    Propyphenazone Anaphylaxis, Itching and Rash    Not able to move    Social History   Socioeconomic History  Marital status: Single    Spouse name: Not on file   Number of children: 2   Years of education: Not on file   Highest education level: Not on file  Occupational History   Occupation: tech support  Tobacco Use   Smoking status: Every Day    Current packs/day: 1.00    Average packs/day: 1 pack/day for 45.6 years (45.6 ttl pk-yrs)    Types: Cigarettes    Start date: 74    Passive exposure: Never   Smokeless tobacco: Former    Types: Chew    Quit date: 1990   Vaping Use   Vaping status: Never Used  Substance and Sexual Activity   Alcohol use: Yes    Comment: rare   Drug use: Yes    Frequency: 1.0 times per week    Types: Marijuana    Comment: Gummies - has had in last 24 hours per patient   Sexual activity: Not Currently  Other Topics Concern   Not on file  Social History Narrative   Divorced, 2 daughters   Works in [employer] for [employer]   1 alcoholic drink a day smokes cigarettes, uses marijuana, 0-1 caffeinated beverages daily   Social Drivers of Health   Financial Resource Strain: Medium Risk (01/15/2022)   Overall Financial Resource Strain (CARDIA)    Difficulty of Paying Living Expenses: Somewhat hard  Food Insecurity: No Food Insecurity (12/24/2022)   Hunger Vital Sign    Worried About Running Out of Food in the Last Year: Never true    Ran Out of Food in the Last Year: Never true  Recent Concern: Food Insecurity - Food Insecurity Present (12/19/2022)   Hunger Vital Sign    Worried About Running Out of Food in the Last Year: Sometimes true    Ran Out of Food in the Last Year: Sometimes true  Transportation Needs: Unmet Transportation Needs (12/22/2022)   PRAPARE - Administrator, Civil Service (Medical): Yes    Lack of Transportation (Non-Medical): Yes  Physical Activity: Inactive (10/10/2023)   Exercise Vital Sign    Days of Exercise per Week: 0 days    Minutes of Exercise per Session: 0 min  Stress: Stress Concern Present (10/10/2023)   [employer] of Occupational Health - Occupational Stress Questionnaire    Feeling of Stress : Rather much  Social Connections: Socially Isolated (10/10/2023)   Social Connection and Isolation Panel    Frequency of Communication with Friends and Family: Twice a week    Frequency of Social Gatherings with Friends and Family: Never    Attends Religious Services: Never    Database administrator or Organizations: No    Attends Banker Meetings: Never    Marital  Status: Divorced  Catering manager Violence: Unknown (12/19/2022)   Humiliation, Afraid, Rape, and Kick questionnaire    Fear of Current or Ex-Partner: No    Emotionally Abused: Not on file    Physically Abused: No    Sexually Abused: No    Family History  Adopted: Yes  Problem Relation Age of Onset   Colon cancer Neg Hx    Stomach cancer Neg Hx    Esophageal cancer Neg Hx    Rectal cancer Neg Hx     Past Surgical History:  Procedure Laterality Date   BIOPSY  10/17/2022   Procedure: BIOPSY;  Surgeon: Teresa Lonni HERO, MD;  Location: [facility];  Service: General;;   COLONOSCOPY     DIVERTING  ILEOSTOMY N/A 09/04/2022   Procedure: LOOP ILEOSTOMY;  Surgeon: Teresa Lonni HERO, MD;  Location: [facility];  Service: General;  Laterality: N/A;   FLEXIBLE SIGMOIDOSCOPY N/A 09/04/2022   Procedure: FLEXIBLE SIGMOIDOSCOPY;  Surgeon: Teresa Lonni HERO, MD;  Location: [facility];  Service: General;  Laterality: N/A;   FLEXIBLE SIGMOIDOSCOPY N/A 10/17/2022   Procedure: FLEXIBLE SIGMOIDOSCOPY;  Surgeon: Teresa Lonni HERO, MD;  Location: [facility];  Service: General;  Laterality: N/A;   ILEOSTOMY CLOSURE N/A 12/18/2022   Procedure: LOOP ILEOSTOMY REVERSAL;  Surgeon: Teresa Lonni HERO, MD;  Location: [facility];  Service: General;  Laterality: N/A;   OPERATIVE ULTRASOUND Right 12/17/2021   Procedure: OPERATIVE ULTRASOUND;  Surgeon: Teresa Lonni HERO, MD;  [facility];  Service: General;  Laterality: Right;   POLYPECTOMY  10/17/2022   Procedure: POLYPECTOMY;  Surgeon: Teresa Lonni HERO, MD;  Location: [facility];  Service: General;;   PORT-A-CATH REMOVAL N/A 05/20/2022   Procedure: REMOVAL PORT-A-CATH;  Surgeon: Teresa Lonni HERO, MD;  Location[facility];  Service: General;  Laterality: N/A;   PORTACATH PLACEMENT Right 12/17/2021   Procedure: INSERTION PORT-A-CATH;  Surgeon: Teresa Lonni HERO, MD;  Location: [facility];  Service: General;  Laterality: Right;    RHINOPLASTY     SEPTOPLASTY     SHOULDER ARTHROSCOPY Left    SHOULDER ARTHROSCOPY WITH OPEN ROTATOR CUFF REPAIR AND DISTAL CLAVICLE ACROMINECTOMY Right 02/01/2021   Procedure: RIGHT SHOULDER ARTHROSCOPY WITH MINI OPEN ROTATOR CUFF REPAIR AND DISTAL CLAVICLE EXCISION;  Surgeon: Marchia Drivers, MD;  Location: [facility];  Service: Orthopedics;  Laterality: Right;   SPINE SURGERY     lumbar laminectomy   WRIST SURGERY Bilateral    corpal tunnel   XI ROBOTIC ASSISTED LOWER ANTERIOR RESECTION N/A 09/04/2022   Procedure: ROBOTIC LOW ANTERIOR RESECTION, BILATERAL TAP BLOCK, AND INOPERATIVE ASSESSMENT OF PERFUSION USING FIREFLY DYE;  Surgeon: Teresa Lonni HERO, MD;  Location: [facility];  Service: General;  Laterality: N/A;    ROS: Review of Systems Negative except as stated above  PHYSICAL EXAM: BP 131/71   Pulse 68   Temp 97.6 F (36.4 C) (Oral)   Resp 16   Ht 6' 1 (1.854 m)   Wt 232 lb (105.2 kg)   SpO2 95%   BMI 30.61 kg/m   Physical Exam HENT:     Head: Normocephalic and atraumatic.     Nose: Nose normal.     Mouth/Throat:     Mouth: Mucous membranes are moist.     Pharynx: Oropharynx is clear.  Eyes:     Extraocular Movements: Extraocular movements intact.     Conjunctiva/sclera: Conjunctivae normal.     Pupils: Pupils are equal, round, and reactive to light.  Cardiovascular:     Rate and Rhythm: Normal rate and regular rhythm.     Pulses: Normal pulses.     Heart sounds: Normal heart sounds.  Pulmonary:     Effort: Pulmonary effort is normal.     Breath sounds: Normal breath sounds.  Musculoskeletal:        General: Normal range of motion.     Right shoulder: Normal.     Left shoulder: Normal.     Right upper arm: Normal.     Left upper arm: Normal.     Right elbow: Normal.     Left elbow: Normal.     Right forearm: Normal.     Left forearm: Normal.     Right wrist: Normal.  Left wrist: Normal.     Right hand: Normal.     Left hand: Normal.      Cervical back: Normal, normal range of motion and neck supple.     Thoracic back: Normal.     Lumbar back: Normal.     Right hip: Normal.     Left hip: Normal.     Right upper leg: Normal.     Left upper leg: Normal.     Right knee: Normal.     Left knee: Normal.     Right lower leg: Normal.     Left lower leg: Normal.     Right ankle: Normal.     Left ankle: Normal.     Right foot: Normal.     Left foot: Normal.  Neurological:     General: No focal deficit present.     Mental Status: He is alert and oriented to person, place, and time.  Psychiatric:        Mood and Affect: Mood normal.        Behavior: Behavior normal.     ASSESSMENT AND PLAN: 1. Erectile dysfunction, unspecified erectile dysfunction type (Primary) - Continue Tadalafil  as prescribed. Counseled on medication adherence/adverse effects.  - Follow-up with primary provider in 3 months or sooner if needed.  - tadalafil  (CIALIS ) 5 MG tablet; Take 1 tablet 1/2 hour to 1 hour prior to intercourse as needed. Limit use to 1/2 tablet or 1 tablet per 24 hours.  Dispense: 90 tablet; Refill: 0  2. Right elbow pain 3. Low back pain, unspecified back pain laterality, unspecified chronicity, unspecified whether sciatica present - Prednisone  as prescribed. Counseled on medication adherence/adverse effects. - Patient declined imaging.  - Patient declined referral to Orthopedics.  - Follow-up with primary provider as scheduled. - predniSONE  (DELTASONE ) 10 MG tablet; Take 6 tablets (60 mg total) by mouth daily with breakfast for 1 day, THEN 5 tablets (50 mg total) daily with breakfast for 1 day, THEN 4 tablets (40 mg total) daily with breakfast for 1 day, THEN 3 tablets (30 mg total) daily with breakfast for 1 day, THEN 2 tablets (20 mg total) daily with breakfast for 1 day, THEN 1 tablet (10 mg total) daily with breakfast for 1 day.  Dispense: 21 tablet; Refill: 0   Patient was given the opportunity to ask questions.  Patient  verbalized understanding of the plan and was able to repeat key elements of the plan. Patient was given clear instructions to go to Emergency Department or return to medical center if symptoms don't improve, worsen, or new problems develop.The patient verbalized understanding.   Requested Prescriptions   Signed Prescriptions Disp Refills   tadalafil  (CIALIS ) 5 MG tablet 90 tablet 0    Sig: Take 1 tablet 1/2 hour to 1 hour prior to intercourse as needed. Limit use to 1/2 tablet or 1 tablet per 24 hours.   predniSONE  (DELTASONE ) 10 MG tablet 21 tablet 0    Sig: Take 6 tablets (60 mg total) by mouth daily with breakfast for 1 day, THEN 5 tablets (50 mg total) daily with breakfast for 1 day, THEN 4 tablets (40 mg total) daily with breakfast for 1 day, THEN 3 tablets (30 mg total) daily with breakfast for 1 day, THEN 2 tablets (20 mg total) daily with breakfast for 1 day, THEN 1 tablet (10 mg total) daily with breakfast for 1 day.    Follow-up with primary provider as scheduled.  Greig JINNY Drones, NP

## 2024-01-13 ENCOUNTER — Ambulatory Visit: Admitting: Pulmonary Disease

## 2024-01-13 ENCOUNTER — Ambulatory Visit (INDEPENDENT_AMBULATORY_CARE_PROVIDER_SITE_OTHER): Admitting: Pulmonary Disease

## 2024-01-13 ENCOUNTER — Encounter: Payer: Self-pay | Admitting: Pulmonary Disease

## 2024-01-13 VITALS — BP 136/68 | HR 80 | Temp 97.3°F | Ht 73.0 in | Wt 233.0 lb

## 2024-01-13 DIAGNOSIS — G4733 Obstructive sleep apnea (adult) (pediatric): Secondary | ICD-10-CM | POA: Diagnosis not present

## 2024-01-13 DIAGNOSIS — J449 Chronic obstructive pulmonary disease, unspecified: Secondary | ICD-10-CM

## 2024-01-13 DIAGNOSIS — R0602 Shortness of breath: Secondary | ICD-10-CM | POA: Diagnosis not present

## 2024-01-13 LAB — PULMONARY FUNCTION TEST
DL/VA % pred: 129 %
DL/VA: 5.34 ml/min/mmHg/L
DLCO unc % pred: 114 %
DLCO unc: 34.21 ml/min/mmHg
FEF 25-75 Post: 1.39 L/s
FEF 25-75 Pre: 1.64 L/s
FEF2575-%Change-Post: -15 %
FEF2575-%Pred-Post: 43 %
FEF2575-%Pred-Pre: 51 %
FEV1-%Change-Post: -3 %
FEV1-%Pred-Post: 55 %
FEV1-%Pred-Pre: 57 %
FEV1-Post: 2.18 L
FEV1-Pre: 2.26 L
FEV1FVC-%Change-Post: -3 %
FEV1FVC-%Pred-Pre: 76 %
FEV6-%Change-Post: 0 %
FEV6-%Pred-Post: 77 %
FEV6-%Pred-Pre: 77 %
FEV6-Post: 3.91 L
FEV6-Pre: 3.87 L
FEV6FVC-%Change-Post: 0 %
FEV6FVC-%Pred-Post: 104 %
FEV6FVC-%Pred-Pre: 103 %
FVC-%Change-Post: 0 %
FVC-%Pred-Post: 74 %
FVC-%Pred-Pre: 74 %
FVC-Post: 3.91 L
FVC-Pre: 3.91 L
Post FEV1/FVC ratio: 56 %
Post FEV6/FVC ratio: 100 %
Pre FEV1/FVC ratio: 58 %
Pre FEV6/FVC Ratio: 99 %
RV % pred: 64 %
RV: 1.56 L
TLC % pred: 76 %
TLC: 5.81 L

## 2024-01-13 MED ORDER — TRELEGY ELLIPTA 100-62.5-25 MCG/ACT IN AEPB: 1.0000 | INHALATION_SPRAY | Freq: Every day | RESPIRATORY_TRACT | 0 refills | Status: DC

## 2024-01-13 MED ORDER — TRELEGY ELLIPTA 100-62.5-25 MCG/ACT IN AEPB: 1.0000 | INHALATION_SPRAY | Freq: Every day | RESPIRATORY_TRACT | 11 refills | Status: DC

## 2024-01-13 NOTE — Progress Notes (Signed)
 Full PFT completed today ? ?
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
## 2024-01-13 NOTE — Patient Instructions (Signed)
 VISIT SUMMARY:  During your visit, we discussed your ongoing issues with chronic obstructive pulmonary disease (COPD), obstructive sleep apnea, and tobacco use. We reviewed your symptoms, current treatments, and concerns about an upcoming long-distance flight.  YOUR PLAN:  -CHRONIC OBSTRUCTIVE PULMONARY DISEASE (COPD): COPD is a chronic lung condition that causes breathing difficulties. You should continue using Trelegy for managing your symptoms. We provided you with samples and sent a prescription to your pharmacy. We also discussed the possibility of using a portable oxygen concentrator for your upcoming flight and advised you to stay mobile during long flights. Additionally, we ordered a breathing test to further assess your condition.  -OBSTRUCTIVE SLEEP APNEA: Obstructive sleep apnea is a condition where your breathing stops and starts during sleep. We are waiting for your Medicare coverage to start in September so you can begin CPAP therapy, which will help manage this condition.  -TOBACCO USE: Continued smoking can worsen your lung health. We discussed the importance of quitting smoking and advised you on smoking cessation strategies.  INSTRUCTIONS:  Please continue using Trelegy as prescribed and pick up your prescription from the pharmacy. Stay mobile during your upcoming flight and consider using a portable oxygen concentrator. We will conduct a breathing test to further evaluate your COPD. Once your Medicare coverage starts in September, we will initiate CPAP therapy for your sleep apnea. Additionally, please work on quitting smoking to improve your overall lung health.

## 2024-01-13 NOTE — Patient Instructions (Signed)
 Full PFT completed today ? ?

## 2024-01-14 ENCOUNTER — Other Ambulatory Visit: Payer: Self-pay

## 2024-01-19 ENCOUNTER — Encounter

## 2024-01-19 ENCOUNTER — Ambulatory Visit: Admitting: Pulmonary Disease

## 2024-01-22 ENCOUNTER — Telehealth: Payer: Self-pay | Admitting: Family

## 2024-01-22 NOTE — Telephone Encounter (Signed)
 A document form has been faxed: [employer] Documentation: medical records from 06/18/2023 to the present. Send document back via Fax within 7-days. Document is located in providers tray at front office.          Fax number: [phone]

## 2024-01-28 NOTE — Telephone Encounter (Signed)
 Me and Berwyn work on and sent the signed request  to medical records- confirmation scanned in chart

## 2024-03-05 ENCOUNTER — Telehealth: Payer: Self-pay | Admitting: *Deleted

## 2024-03-05 MED ORDER — PREDNISONE 50 MG PO TABS
ORAL_TABLET | ORAL | 0 refills | Status: DC
Start: 2024-03-05 — End: 2024-04-20

## 2024-03-05 NOTE — Telephone Encounter (Signed)
 Called Billy Roman with appointments on 10/22 for lab/CT scan and prep. Sent Prednisone  script to his pharmacy. He declines the appointments at this time. States he is going to [place] in October to marry his fiance'. Requested his orders for lab/CT be mailed to him and he may see if he can get it done in [place] and he will get a CD and bring to [place] . Informed him they most likely do not use the same system as [facility] and we may not be able to read his images. He verbalized understanding. Requested information mailed to his home as requested.

## 2024-04-07 ENCOUNTER — Other Ambulatory Visit

## 2024-04-07 ENCOUNTER — Other Ambulatory Visit (HOSPITAL_BASED_OUTPATIENT_CLINIC_OR_DEPARTMENT_OTHER)

## 2024-04-08 ENCOUNTER — Encounter: Payer: Self-pay | Admitting: Oncology

## 2024-04-14 ENCOUNTER — Other Ambulatory Visit: Admitting: Oncology

## 2024-04-14 ENCOUNTER — Other Ambulatory Visit: Payer: Self-pay | Admitting: *Deleted

## 2024-04-14 ENCOUNTER — Encounter: Payer: Self-pay | Admitting: Pulmonary Disease

## 2024-04-14 DIAGNOSIS — C20 Malignant neoplasm of rectum: Secondary | ICD-10-CM

## 2024-04-15 ENCOUNTER — Inpatient Hospital Stay: Payer: Self-pay | Admitting: Oncology

## 2024-04-20 ENCOUNTER — Other Ambulatory Visit: Payer: Self-pay | Admitting: *Deleted

## 2024-04-20 MED ORDER — PREDNISONE 50 MG PO TABS: ORAL_TABLET | ORAL | 0 refills | Status: DC

## 2024-04-21 ENCOUNTER — Encounter: Payer: Self-pay | Admitting: Oncology

## 2024-04-21 ENCOUNTER — Ambulatory Visit (HOSPITAL_BASED_OUTPATIENT_CLINIC_OR_DEPARTMENT_OTHER)
Admission: RE | Admit: 2024-04-21 | Discharge: 2024-04-21 | Disposition: A | Discharge status: Home / Self Care | Payer: Self-pay | Source: Ambulatory Visit | Attending: Oncology | Admitting: Oncology

## 2024-04-21 DIAGNOSIS — C20 Malignant neoplasm of rectum: Secondary | ICD-10-CM | POA: Diagnosis present

## 2024-04-21 DIAGNOSIS — Z85048 Personal history of other malignant neoplasm of rectum, rectosigmoid junction, and anus: Secondary | ICD-10-CM | POA: Diagnosis not present

## 2024-04-21 DIAGNOSIS — R918 Other nonspecific abnormal finding of lung field: Secondary | ICD-10-CM | POA: Diagnosis not present

## 2024-04-21 DIAGNOSIS — I700 Atherosclerosis of aorta: Secondary | ICD-10-CM | POA: Diagnosis not present

## 2024-04-21 MED ORDER — IOHEXOL 300 MG/ML  SOLN
100.0000 mL | Freq: Once | INTRAMUSCULAR | Status: AC | PRN
  Administered 2024-04-21: 100 mL via INTRAVENOUS

## 2024-04-26 ENCOUNTER — Inpatient Hospital Stay

## 2024-04-26 ENCOUNTER — Inpatient Hospital Stay: Payer: Self-pay | Attending: Oncology | Admitting: Oncology

## 2024-04-26 VITALS — BP 115/73 | HR 81 | Temp 98.3°F | Resp 18 | Ht 73.0 in | Wt 235.2 lb

## 2024-04-26 DIAGNOSIS — F32A Depression, unspecified: Secondary | ICD-10-CM | POA: Insufficient documentation

## 2024-04-26 DIAGNOSIS — Z23 Encounter for immunization: Secondary | ICD-10-CM | POA: Insufficient documentation

## 2024-04-26 DIAGNOSIS — G629 Polyneuropathy, unspecified: Secondary | ICD-10-CM | POA: Insufficient documentation

## 2024-04-26 DIAGNOSIS — J449 Chronic obstructive pulmonary disease, unspecified: Secondary | ICD-10-CM | POA: Insufficient documentation

## 2024-04-26 DIAGNOSIS — Z85048 Personal history of other malignant neoplasm of rectum, rectosigmoid junction, and anus: Secondary | ICD-10-CM | POA: Diagnosis not present

## 2024-04-26 DIAGNOSIS — R970 Elevated carcinoembryonic antigen [CEA]: Secondary | ICD-10-CM | POA: Diagnosis not present

## 2024-04-26 DIAGNOSIS — Z08 Encounter for follow-up examination after completed treatment for malignant neoplasm: Secondary | ICD-10-CM | POA: Insufficient documentation

## 2024-04-26 DIAGNOSIS — C20 Malignant neoplasm of rectum: Secondary | ICD-10-CM

## 2024-04-26 DIAGNOSIS — G4730 Sleep apnea, unspecified: Secondary | ICD-10-CM | POA: Diagnosis not present

## 2024-04-26 DIAGNOSIS — Z125 Encounter for screening for malignant neoplasm of prostate: Secondary | ICD-10-CM | POA: Diagnosis not present

## 2024-04-26 LAB — CEA (ACCESS): CEA (CHCC): 4.01 ng/mL (ref 0.00–5.00)

## 2024-04-26 MED ORDER — INFLUENZA VIRUS VACC SPLIT PF (FLUZONE) 0.5 ML IM SUSY
0.5000 mL | PREFILLED_SYRINGE | Freq: Once | INTRAMUSCULAR | Status: AC
  Administered 2024-04-26: 0.5 mL via INTRAMUSCULAR
  Filled 2024-04-26: qty 0.5

## 2024-04-26 NOTE — Progress Notes (Signed)
 [facility] OFFICE PROGRESS NOTE   Diagnosis: Rectal cancer  INTERVAL HISTORY:   Mr. Obrecht returns as scheduled.  He has irregular bowel habits.  No bleeding.  He reports persistent neuropathy symptoms in the hands greater than the feet.  No new complaint.  Objective:  Vital signs in last 24 hours:  Blood pressure 115/73, pulse 81, temperature 98.3 F (36.8 C), temperature source Temporal, resp. rate 18, height 6' 1 (1.854 m), weight 235 lb 3.2 oz (106.7 kg), SpO2 (!) 82%.    Lymphatics: No cervical, supraclavicular, axillary, or inguinal nodes Resp: Lungs with distant breath sounds, no respiratory distress Cardio: Regular rate and rhythm GI: No hepatosplenomegaly, no mass, nontender Vascular: No leg edema   Lab Results:  Lab Results  Component Value Date   WBC 8.0 12/20/2022   HGB 13.4 12/20/2022   HCT 40.6 12/20/2022   MCV 92.3 12/20/2022   PLT 203 12/20/2022   NEUTROABS 2.1 08/27/2022    CMP  Lab Results  Component Value Date   NA 138 04/01/2023   K 4.1 04/01/2023   CL 103 04/01/2023   CO2 26 04/01/2023   GLUCOSE 131 (H) 04/01/2023   BUN 16 04/01/2023   CREATININE 0.71 04/01/2023   CALCIUM  9.9 04/01/2023   PROT 7.4 08/27/2022   ALBUMIN  4.1 08/27/2022   AST 32 08/27/2022   ALT 32 08/27/2022   ALKPHOS 55 08/27/2022   BILITOT 0.6 08/27/2022   GFRNONAA >60 04/01/2023   GFRAA 117 07/26/2019    Lab Results  Component Value Date   CEA 3.61 10/07/2023     Medications: I have reviewed the patient's current medications.   Assessment/Plan: Adenocarcinoma of the proximal rectum/distal sigmoid Colonoscopy 11/19/2021-partially obstructing mass at 12-19 cm, biopsy moderate to poorly differentiated adenocarcinoma, mismatch repair protein expression intact Mildly elevated CEA CTs 11/29/2021-mural thickening in the distal sigmoid/proximal rectum with haziness of the mesorectal fat, mesorectal lymphadenopathy, small pulmonary nodules favored to  be benign MRI pelvis 12/03/2021-tumor at 10 cm from the anal verge, T3c, approximately 10 metastatic nodes in the mesorectal sheath and presacral space, no extra mesorectal lymphadenopathy, N2 Cycle 1 FOLFOX 12/19/2021 Cycle 2 FOLFOX 01/02/2022, home Decadron  prophylaxis added for delayed nausea Cycle 3 FOLFOX 01/15/2022 Cycle 4 FOLFOX 01/29/2022, 5-FU bolus, leucovorin , and oxaliplatin  dose reduced secondary to neutropenia and thrombocytopenia Cycle 5 FOLFOX 02/12/2022, Udenyca  Cycle 6 FOLFOX 02/26/2022, Udenyca  Cycle 7 FOLFOX 03/12/2022, Udenyca   Cycle 8 FOLFOX 03/26/2022, oxaliplatin  held due to neuropathy, Udenyca  held Radiation/Xeloda  04/22/2022-06/03/2022 CTs 08/28/2022-persistent submucosal thickening through the distal sigmoid colon and rectum, no mass lesion evident, decrease in size of lymph nodes in the mesorectal sheath with remaining enlarged lymph nodes, no evidence of metastatic disease Low anterior resection/diverting loop ileostomy 09/04/2022-no residual tumor, treatment effect present with no viable cancer cells, ypT0, 0/13 nodes, negative resection margins 12/18/2022 loop ileostomy takedown CTs 04/01/2023-no evidence of recurrent disease, stable subcentimeter pulmonary nodules Colonoscopy 04/28/2023: No polyps ,fair to adequate prep, 1 year surveillance colonoscopy recommended CTs 04/21/2024: Nodularity at the posterior rectum inferior to the suture line favored bowel wall/stool-nonspecific, wall thickening versus underdistention of the hepatic flexure, stable mesorectal/presacral soft tissue and fluid, previous 3 mm right upper lobe nodule measures 4 mm 2.   Major depressive disorder Change in bowel habits and rectal bleeding secondary to #1 Ongoing tobacco use Report of blurred vision and diplopia Peripheral neuropathy-fingers and left foot      Disposition: Mr. Archuletta is in clinical remission from colon cancer.  He will return  to the lab for a CEA today.  Restaging CTs reveal no  evidence of recurrent rectal cancer.  The nodularity at the posterior margin of the rectum is likely a benign finding.  He underwent a colonoscopy last year and is due for a follow-up colonoscopy this year.  He plans to delay the colonoscopy until after he returns from a trip to [place]. He requested we check the PSA today.  Mr. Tillman will return for an office visit and CEA in 6 months.  He received an influenza vaccine today.  I recommended he obtain a pneumococcal 21 vaccine.  He declines the COVID-19 vaccine.  Arley Hof, MD  04/26/2024  2:04 PM

## 2024-04-27 ENCOUNTER — Ambulatory Visit: Payer: Self-pay | Admitting: Oncology

## 2024-04-27 LAB — PROSTATE-SPECIFIC AG, SERUM (LABCORP): Prostate Specific Ag, Serum: 0.2 ng/mL (ref 0.0–4.0)

## 2024-04-27 NOTE — Telephone Encounter (Signed)
 Patient gave verbal understanding and had no further question

## 2024-04-27 NOTE — Telephone Encounter (Signed)
-----   Message from Arley Hof sent at 04/27/2024  9:15 AM EST ----- Please call patient, the PSA and CEA are normal, follow-up as scheduled  ----- Message ----- From: Rebecka, Lab [facility] Sent: 04/26/2024   3:56 PM EST To: Arley KATHEE Hof, MD

## 2024-04-28 ENCOUNTER — Other Ambulatory Visit: Payer: Self-pay

## 2024-05-06 ENCOUNTER — Ambulatory Visit: Admitting: Pulmonary Disease

## 2024-05-06 ENCOUNTER — Encounter: Payer: Self-pay | Admitting: Pulmonary Disease

## 2024-05-06 VITALS — BP 126/70 | HR 76 | Temp 97.6°F | Ht 73.0 in | Wt 243.8 lb

## 2024-05-06 DIAGNOSIS — C20 Malignant neoplasm of rectum: Secondary | ICD-10-CM

## 2024-05-06 DIAGNOSIS — G4733 Obstructive sleep apnea (adult) (pediatric): Secondary | ICD-10-CM

## 2024-05-06 DIAGNOSIS — R0602 Shortness of breath: Secondary | ICD-10-CM

## 2024-05-06 DIAGNOSIS — J449 Chronic obstructive pulmonary disease, unspecified: Secondary | ICD-10-CM

## 2024-05-06 MED ORDER — TRELEGY ELLIPTA 100-62.5-25 MCG/ACT IN AEPB: 1.0000 | INHALATION_SPRAY | Freq: Every day | RESPIRATORY_TRACT | 11 refills | Status: AC

## 2024-05-06 NOTE — Progress Notes (Signed)
 Subjective:    Patient ID: Billy Roman, male    DOB: 05-24-1961, 63 y.o.   MRN: [id number]  Patient Care Team: Jaycee Greig PARAS, NP as PCP - General (Nurse Practitioner) Nori Sari SQUIBB, RN as Oncology Nurse Navigator Teresa Lonni HERO, MD as Consulting Physician (General Surgery) Avram Lupita BRAVO, MD as Consulting Physician (Gastroenterology) Cloretta Arley KATHEE, MD as Consulting Physician (Oncology) Dewey Rush, MD as Consulting Physician (Radiation Oncology)  Chief Complaint  Patient presents with   COPD    Cough. Off of Trelegy, due to insurance.     BACKGROUND/INTERVAL:[name] is a 63 year old current smoker (1 PPD) with a history of rectal cancer in remission who presents for follow-up of shortness of breath with suspected COPD.  He was initially seen here on 19 November 2023.  No new issues in the interim.  He finally obtained CPAP approximately 12 days ago.  HPI Discussed the use of AI scribe software for clinical note transcription with the patient, who gave verbal consent to proceed.  History of Present Illness   Billy Roman is a 63 year old male with stage two COPD and obstructive sleep apnea who presents for follow-up.  He has not been using his inhaler because he lost it. He reports that it helps when he uses it. He is concerned about flying long distances due to potential issues with high altitude and is preparing for a 14-hour flight to see his fiance after six years.  He is traveling to [place].  He confirms using his CPAP mask for obstructive sleep apnea but mentions difficulty with keeping it on throughout the night. He has ordered a new mask from [employer].  He has received his flu shot in preparation for his upcoming travel.     DATA 12/04/2023 Home sleep study: Mild obstructive apnea. 01/13/2024 PFTs: FEV1 2.26 L or 57% predicted, FVC 3.91 L or 74% predicted, FEV1/FVC 58%, no bronchodilator response, lung volumes were invalid due to patient's  inability to reproduce maneuvers, diffusion capacity normal.  Consistent with moderate to severe obstructive disease.   Review of Systems A 10 point review of systems was performed and it is as noted above otherwise negative.   Patient Active Problem List   Diagnosis Date Noted   Peripheral neuropathy 10/14/2023   Chronic post-traumatic stress disorder (PTSD) 08/04/2023   Cannabis use, uncomplicated 08/04/2023   Prediabetes 02/19/2023   S/P closure of ileostomy 12/18/2022   Essential tremor 12/09/2022   Irritant contact dermatitis associated with fecal stoma 10/13/2022   Right inguinal hernia 09/05/2022   Insomnia 09/05/2022   Chronic pain 09/05/2022   S/P robot-assisted surgical procedure 09/04/2022   Ileostomy care (HCC) 09/04/2022   Rectal cancer (HCC) 11/20/2021   Cognitive impairment 09/18/2021   Confusion 09/18/2021   Tremor 09/18/2021   Memory loss 08/24/2021   Tremor of both hands 08/24/2021   H/O rotator cuff surgery 08/24/2021   History of COVID-19 08/24/2021   Nicotine  use disorder 08/24/2021   [name], recurrent severe, without psychosis (HCC) 08/23/2021   Stiffness of right shoulder joint 02/09/2021   Pain in joint of right shoulder 02/09/2021   HNP (herniated nucleus pulposus), lumbar 01/15/2021   Sciatica 01/15/2021   Anxiety and depression 08/23/2009    Social History   Tobacco Use   Smoking status: Every Day    Current packs/day: 1.00    Average packs/day: 1 pack/day for 45.9 years (45.9 ttl pk-yrs)    Types: Cigarettes  Start date: 84    Passive exposure: Never   Smokeless tobacco: Former    Types: Chew    Quit date: 1990   Tobacco comments:    5-6 cigarettes a day - khj 01/13/2024  Substance Use Topics   Alcohol use: Yes    Comment: rare    Allergies  Allergen Reactions   Omnipaque  [Iohexol ] Nausea And Vomiting    08/28/22- second time vomiting having scan with IV contrast Perfuse vomiting.    Propyphenazone  Anaphylaxis, Itching and Rash    Not able to move    Current Meds  Medication Sig   albuterol  (PROVENTIL ) (2.5 MG/3ML) 0.083% nebulizer solution Take 3 mLs (2.5 mg total) by nebulization every 6 (six) hours as needed for wheezing or shortness of breath.   albuterol  (VENTOLIN  HFA) 108 (90 Base) MCG/ACT inhaler Inhale 1-2 puffs into the lungs every 6 (six) hours as needed for wheezing or shortness of breath.   gabapentin  (NEURONTIN ) 300 MG capsule Take 1 capsule (300 mg total) by mouth 3 (three) times daily as needed (neuropathy).   methocarbamol  (ROBAXIN ) 500 MG tablet Take 500 mg by mouth every 8 (eight) hours as needed for muscle spasms.   prochlorperazine  (COMPAZINE ) 10 MG tablet Take 1 tablet (10 mg total) by mouth every 6 (six) hours as needed for nausea or vomiting.   Current Facility-Administered Medications for the 05/06/24 encounter (Office Visit) with Tamea Dedra CROME, MD  Medication   0.9 %  sodium chloride  infusion    Immunization History  Administered Date(s) Administered   Influenza, Seasonal, Injecte, Preservative Fre 04/08/2023, 04/26/2024   Influenza,inj,Quad PF,6+ Mos 07/26/2019, 03/14/2021, 05/07/2022   Pneumococcal Polysaccharide-23 08/24/2021   Tdap 01/15/2021   Zoster Recombinant(Shingrix) 03/23/2021, 06/04/2021        Objective:     Vitals:   05/06/24 1341  BP: 126/70  Pulse: 76  Temp: 97.6 F (36.4 C)  Height: 6' 1 (1.854 m)  Weight: 243 lb 12.8 oz (110.6 kg)  SpO2: 98%  TempSrc: Temporal  BMI (Calculated): 32.17     SpO2: 98 %  GENERAL: Well-developed, overweight gentleman, no acute distress, fully ambulatory, no conversational dyspnea. HEAD: Normocephalic, atraumatic.  EYES: Pupils equal, round, reactive to light.  No scleral icterus.  MOUTH: Dentition intact, some chipped teeth, oral mucosa moist, no thrush. NECK: Supple. No thyromegaly. Trachea midline. No JVD.  No adenopathy. PULMONARY: Poor air entry bilaterally.  Coarse, otherwise no  adventitious sounds. CARDIOVASCULAR: S1 and S2. Regular rate and rhythm.  ABDOMEN: Scars from prior colectomy and ostomy present.  These are well-healed.  Nondistended, otherwise benign. MUSCULOSKELETAL: No joint deformity, no clubbing, no edema.  NEUROLOGIC: No overt focal deficit, no gait disturbance, speech is fluent. SKIN: Intact,warm,dry. PSYCH: Mood and behavior normal    Assessment & Plan:     ICD-10-CM   1. Stage 2 moderate COPD by GOLD classification (HCC)  J44.9     2. Shortness of breath  R06.02     3. OSA (obstructive sleep apnea)  G47.33     4. Rectal cancer (HCC)  C20      Meds ordered this encounter  Medications   Fluticasone -Umeclidin-Vilant (TRELEGY ELLIPTA ) 100-62.5-25 MCG/ACT AEPB    Sig: Inhale 1 puff into the lungs daily.    Dispense:  60 each    Refill:  11   Discussion:    Chronic obstructive pulmonary disease, stage 2 Stage 2 COPD with current non-use of inhaler due to misplacement. Reports benefit from Trelegy Ellipta  when used.  Concerns about high altitude travel due to upcoming long-distance flight. Oxygen saturation is 98%, indicating adequate oxygenation. - Advised to locate and use Trelegy Ellipta  inhaler regularly. - Reassured regarding high altitude travel, noting airline cabin pressurization to equivalent of 8,000 feet. - Advised on managing fatigue and jet lag during long flights.  Obstructive sleep apnea Managed with CPAP. Reports difficulty keeping CPAP mask on during sleep. CPAP mask is provided by [employer]. - Encouraged wearing CPAP mask during activities like watching TV to increase familiarity. - Advised on gradual acclimatization to CPAP use to improve compliance.     Advised if symptoms do not improve or worsen, to please contact office for sooner follow up or seek emergency care.    I spent 32 minutes of dedicated to the care of this patient on the date of this encounter to include pre-visit review of records, face-to-face time  with the patient discussing conditions above, post visit ordering of testing, clinical documentation with the electronic health record, making appropriate referrals as documented, and communicating necessary findings to members of the patients care team.     C. Leita Sanders, MD Advanced Bronchoscopy PCCM [facility]    *This note was generated using voice recognition software/Dragon and/or AI transcription program.  Despite best efforts to proofread, errors can occur which can change the meaning. Any transcriptional errors that result from this process are unintentional and may not be fully corrected at the time of dictation.

## 2024-05-06 NOTE — Progress Notes (Deleted)
 Subjective:    Patient ID: Billy Roman, male    DOB: 1960/12/06, 63 y.o.   MRN: [id number]  Patient Care Team: Jaycee Greig PARAS, NP as PCP - General (Nurse Practitioner) Nori Sari SQUIBB, RN as Oncology Nurse Navigator Teresa Lonni HERO, MD as Consulting Physician (General Surgery) Avram Lupita BRAVO, MD as Consulting Physician (Gastroenterology) Cloretta Arley KATHEE, MD as Consulting Physician (Oncology) Dewey Rush, MD as Consulting Physician (Radiation Oncology)  Chief Complaint  Patient presents with   COPD    Cough. Off of Trelegy, due to insurance.     BACKGROUND/INTERVAL:  HPI   Review of Systems A 10 point review of systems was performed and it is as noted above otherwise negative.   Patient Active Problem List   Diagnosis Date Noted   Peripheral neuropathy 10/14/2023   Chronic post-traumatic stress disorder (PTSD) 08/04/2023   Cannabis use, uncomplicated 08/04/2023   Prediabetes 02/19/2023   S/P closure of ileostomy 12/18/2022   Essential tremor 12/09/2022   Irritant contact dermatitis associated with fecal stoma 10/13/2022   Right inguinal hernia 09/05/2022   Insomnia 09/05/2022   Chronic pain 09/05/2022   S/P robot-assisted surgical procedure 09/04/2022   Ileostomy care (HCC) 09/04/2022   Rectal cancer (HCC) 11/20/2021   Cognitive impairment 09/18/2021   Confusion 09/18/2021   Tremor 09/18/2021   Memory loss 08/24/2021   Tremor of both hands 08/24/2021   H/O rotator cuff surgery 08/24/2021   History of COVID-19 08/24/2021   Nicotine  use disorder 08/24/2021   [name], recurrent severe, without psychosis (HCC) 08/23/2021   Stiffness of right shoulder joint 02/09/2021   Pain in joint of right shoulder 02/09/2021   HNP (herniated nucleus pulposus), lumbar 01/15/2021   Sciatica 01/15/2021   Anxiety and depression 08/23/2009    Social History   Tobacco Use   Smoking status: Every Day    Current packs/day: 1.00    Average  packs/day: 1 pack/day for 45.9 years (45.9 ttl pk-yrs)    Types: Cigarettes    Start date: 1980    Passive exposure: Never   Smokeless tobacco: Former    Types: Chew    Quit date: 1990   Tobacco comments:    5-6 cigarettes a day - khj 01/13/2024  Substance Use Topics   Alcohol use: Yes    Comment: rare    Allergies  Allergen Reactions   Omnipaque  [Iohexol ] Nausea And Vomiting    08/28/22- second time vomiting having scan with IV contrast Perfuse vomiting.    Propyphenazone Anaphylaxis, Itching and Rash    Not able to move    Current Meds  Medication Sig   albuterol  (PROVENTIL ) (2.5 MG/3ML) 0.083% nebulizer solution Take 3 mLs (2.5 mg total) by nebulization every 6 (six) hours as needed for wheezing or shortness of breath.   albuterol  (VENTOLIN  HFA) 108 (90 Base) MCG/ACT inhaler Inhale 1-2 puffs into the lungs every 6 (six) hours as needed for wheezing or shortness of breath.   gabapentin  (NEURONTIN ) 300 MG capsule Take 1 capsule (300 mg total) by mouth 3 (three) times daily as needed (neuropathy).   methocarbamol  (ROBAXIN ) 500 MG tablet Take 500 mg by mouth every 8 (eight) hours as needed for muscle spasms.   prochlorperazine  (COMPAZINE ) 10 MG tablet Take 1 tablet (10 mg total) by mouth every 6 (six) hours as needed for nausea or vomiting.   Current Facility-Administered Medications for the 05/06/24 encounter (Office Visit) with Tamea Dedra CROME, MD  Medication   0.9 %  sodium chloride  infusion    Immunization History  Administered Date(s) Administered   Influenza, Seasonal, Injecte, Preservative Fre 04/08/2023, 04/26/2024   Influenza,inj,Quad PF,6+ Mos 07/26/2019, 03/14/2021, 05/07/2022   Pneumococcal Polysaccharide-23 08/24/2021   Tdap 01/15/2021   Zoster Recombinant(Shingrix) 03/23/2021, 06/04/2021        Objective:     Vitals:   05/06/24 1341  BP: 126/70  Pulse: 76  Temp: 97.6 F (36.4 C)  Height: 6' 1 (1.854 m)  Weight: 243 lb 12.8 oz (110.6 kg)  SpO2:  98%  TempSrc: Temporal  BMI (Calculated): 32.17     SpO2: 98 %  GENERAL: HEAD: Normocephalic, atraumatic.  EYES: Pupils equal, round, reactive to light.  No scleral icterus.  MOUTH:  NECK: Supple. No thyromegaly. Trachea midline. No JVD.  No adenopathy. PULMONARY: Good air entry bilaterally.  No adventitious sounds. CARDIOVASCULAR: S1 and S2. Regular rate and rhythm.  ABDOMEN: MUSCULOSKELETAL: No joint deformity, no clubbing, no edema.  NEUROLOGIC:  SKIN: Intact,warm,dry. PSYCH:        Assessment & Plan:   No diagnosis found.  No orders of the defined types were placed in this encounter.   No orders of the defined types were placed in this encounter.     Advised if symptoms do not improve or worsen, to please contact office for sooner follow up or seek emergency care.    I spent xxx minutes of dedicated to the care of this patient on the date of this encounter to include pre-visit review of records, face-to-face time with the patient discussing conditions above, post visit ordering of testing, clinical documentation with the electronic health record, making appropriate referrals as documented, and communicating necessary findings to members of the patients care team.     C. Leita Sanders, MD Advanced Bronchoscopy PCCM [facility]    *This note was generated using voice recognition software/Dragon and/or AI transcription program.  Despite best efforts to proofread, errors can occur which can change the meaning. Any transcriptional errors that result from this process are unintentional and may not be fully corrected at the time of dictation.

## 2024-05-06 NOTE — Patient Instructions (Signed)
 VISIT SUMMARY:  You came in today for a follow-up visit regarding your COPD and obstructive sleep apnea. We discussed your concerns about using your inhaler and CPAP mask, especially with your upcoming long-distance flight.  YOUR PLAN:  -CHRONIC OBSTRUCTIVE PULMONARY DISEASE, STAGE 2: COPD is a chronic lung condition that makes it hard to breathe. You mentioned you lost your Trelegy Ellipta  inhaler, which helps you when you use it. Please locate and use your inhaler regularly. Your oxygen levels are good at 98%, and you should be fine during your flight as the cabin will be pressurized to a safe level. We also discussed ways to manage fatigue and jet lag during your trip.  -OBSTRUCTIVE SLEEP APNEA: Obstructive sleep apnea is a condition where your breathing stops and starts during sleep. You are using a CPAP mask but have trouble keeping it on all night. Try wearing your CPAP mask during activities like watching TV to get more used to it. Gradually increase the time you use it to improve your comfort and compliance.  INSTRUCTIONS:  Please make sure to use your Trelegy Ellipta  inhaler regularly. Try to get used to your CPAP mask by wearing it during the day while doing activities like watching TV. Gradually increase the time you use it at night. If you have any issues or concerns during your flight, remember that the cabin will be pressurized to a safe level. Follow up with us  if you have any further concerns or need additional assistance.

## 2024-06-01 DIAGNOSIS — G4733 Obstructive sleep apnea (adult) (pediatric): Secondary | ICD-10-CM | POA: Diagnosis not present

## 2024-07-03 ENCOUNTER — Encounter: Payer: Self-pay | Admitting: Internal Medicine

## 2024-08-09 ENCOUNTER — Ambulatory Visit: Admitting: Pulmonary Disease

## 2024-08-12 ENCOUNTER — Ambulatory Visit: Admitting: Pulmonary Disease

## 2024-10-25 ENCOUNTER — Inpatient Hospital Stay

## 2024-10-25 ENCOUNTER — Inpatient Hospital Stay: Admitting: Oncology
